# Patient Record
Sex: Female | Born: 1945 | Race: Black or African American | Hispanic: No | Marital: Single | Smoking: Former smoker
Health system: Southern US, Community
[De-identification: ages and names within clinical notes are randomized; demographics above are authoritative.]

## PROBLEM LIST (undated history)

## (undated) DIAGNOSIS — H9319 Tinnitus, unspecified ear: Secondary | ICD-10-CM

## (undated) DIAGNOSIS — I319 Disease of pericardium, unspecified: Secondary | ICD-10-CM

## (undated) DIAGNOSIS — D500 Iron deficiency anemia secondary to blood loss (chronic): Secondary | ICD-10-CM

## (undated) DIAGNOSIS — G609 Hereditary and idiopathic neuropathy, unspecified: Secondary | ICD-10-CM

## (undated) DIAGNOSIS — R3915 Urgency of urination: Secondary | ICD-10-CM

## (undated) DIAGNOSIS — R350 Frequency of micturition: Secondary | ICD-10-CM

## (undated) DIAGNOSIS — J189 Pneumonia, unspecified organism: Secondary | ICD-10-CM

## (undated) DIAGNOSIS — Z9981 Dependence on supplemental oxygen: Secondary | ICD-10-CM

## (undated) DIAGNOSIS — J449 Chronic obstructive pulmonary disease, unspecified: Secondary | ICD-10-CM

## (undated) DIAGNOSIS — F329 Major depressive disorder, single episode, unspecified: Secondary | ICD-10-CM

## (undated) DIAGNOSIS — I10 Essential (primary) hypertension: Secondary | ICD-10-CM

## (undated) DIAGNOSIS — F3289 Other specified depressive episodes: Secondary | ICD-10-CM

## (undated) DIAGNOSIS — E785 Hyperlipidemia, unspecified: Secondary | ICD-10-CM

## (undated) DIAGNOSIS — I313 Pericardial effusion (noninflammatory): Secondary | ICD-10-CM

## (undated) DIAGNOSIS — Z91199 Patient's noncompliance with other medical treatment and regimen due to unspecified reason: Secondary | ICD-10-CM

## (undated) DIAGNOSIS — F411 Generalized anxiety disorder: Secondary | ICD-10-CM

## (undated) DIAGNOSIS — F410 Panic disorder [episodic paroxysmal anxiety] without agoraphobia: Secondary | ICD-10-CM

## (undated) DIAGNOSIS — E669 Obesity, unspecified: Secondary | ICD-10-CM

## (undated) DIAGNOSIS — J9620 Acute and chronic respiratory failure, unspecified whether with hypoxia or hypercapnia: Secondary | ICD-10-CM

## (undated) DIAGNOSIS — Z9119 Patient's noncompliance with other medical treatment and regimen: Secondary | ICD-10-CM

## (undated) DIAGNOSIS — J4489 Other specified chronic obstructive pulmonary disease: Secondary | ICD-10-CM

## (undated) DIAGNOSIS — K625 Hemorrhage of anus and rectum: Secondary | ICD-10-CM

## (undated) HISTORY — DX: Other specified chronic obstructive pulmonary disease: J44.89

## (undated) HISTORY — DX: Acute and chronic respiratory failure, unspecified whether with hypoxia or hypercapnia: J96.20

## (undated) HISTORY — DX: Obesity, unspecified: E66.9

## (undated) HISTORY — DX: Urgency of urination: R39.15

## (undated) HISTORY — DX: Hemorrhage of anus and rectum: K62.5

## (undated) HISTORY — DX: Panic disorder (episodic paroxysmal anxiety): F41.0

## (undated) HISTORY — DX: Chronic obstructive pulmonary disease, unspecified: J44.9

## (undated) HISTORY — DX: Frequency of micturition: R35.0

## (undated) HISTORY — DX: Hyperlipidemia, unspecified: E78.5

## (undated) HISTORY — PX: BREAST BIOPSY: SHX20

## (undated) HISTORY — DX: Disease of pericardium, unspecified: I31.9

## (undated) HISTORY — DX: Other specified depressive episodes: F32.89

## (undated) HISTORY — DX: Pericardial effusion (noninflammatory): I31.3

## (undated) HISTORY — DX: Essential (primary) hypertension: I10

## (undated) HISTORY — DX: Patient's noncompliance with other medical treatment and regimen due to unspecified reason: Z91.199

## (undated) HISTORY — DX: Patient's noncompliance with other medical treatment and regimen: Z91.19

## (undated) HISTORY — PX: TOTAL ABDOMINAL HYSTERECTOMY: SHX209

## (undated) HISTORY — DX: Major depressive disorder, single episode, unspecified: F32.9

## (undated) HISTORY — DX: Hereditary and idiopathic neuropathy, unspecified: G60.9

## (undated) HISTORY — DX: Generalized anxiety disorder: F41.1

## (undated) HISTORY — DX: Tinnitus, unspecified ear: H93.19

## (undated) HISTORY — DX: Pneumonia, unspecified organism: J18.9

---

## 2010-04-14 ENCOUNTER — Inpatient Hospital Stay (HOSPITAL_COMMUNITY)
Admission: EM | Admit: 2010-04-14 | Discharge: 2010-04-16 | Payer: Self-pay | Source: Home / Self Care | Admitting: Emergency Medicine

## 2010-07-01 ENCOUNTER — Inpatient Hospital Stay (HOSPITAL_COMMUNITY)
Admission: EM | Admit: 2010-07-01 | Discharge: 2010-07-06 | Payer: Self-pay | Source: Home / Self Care | Attending: Internal Medicine | Admitting: Internal Medicine

## 2010-07-02 DIAGNOSIS — J449 Chronic obstructive pulmonary disease, unspecified: Secondary | ICD-10-CM

## 2010-07-02 LAB — CBC
HCT: 39.3 % (ref 36.0–46.0)
Hemoglobin: 12 g/dL (ref 12.0–15.0)
MCH: 28.1 pg (ref 26.0–34.0)
MCHC: 30.5 g/dL (ref 30.0–36.0)
MCV: 92 fL (ref 78.0–100.0)
Platelets: 276 10*3/uL (ref 150–400)
RBC: 4.27 MIL/uL (ref 3.87–5.11)
RDW: 15.8 % — ABNORMAL HIGH (ref 11.5–15.5)
WBC: 8.2 10*3/uL (ref 4.0–10.5)

## 2010-07-02 LAB — POCT I-STAT 3, ART BLOOD GAS (G3+)
Acid-Base Excess: 9 mmol/L — ABNORMAL HIGH (ref 0.0–2.0)
Bicarbonate: 39 mEq/L — ABNORMAL HIGH (ref 20.0–24.0)
O2 Saturation: 91 %
Patient temperature: 98.6
TCO2: 42 mmol/L (ref 0–100)
pCO2 arterial: 85.7 mmHg (ref 35.0–45.0)
pH, Arterial: 7.266 — ABNORMAL LOW (ref 7.350–7.400)
pO2, Arterial: 73 mmHg — ABNORMAL LOW (ref 80.0–100.0)

## 2010-07-02 LAB — DIFFERENTIAL
Basophils Absolute: 0 10*3/uL (ref 0.0–0.1)
Basophils Relative: 0 % (ref 0–1)
Eosinophils Absolute: 0.5 10*3/uL (ref 0.0–0.7)
Eosinophils Relative: 6 % — ABNORMAL HIGH (ref 0–5)
Lymphocytes Relative: 31 % (ref 12–46)
Lymphs Abs: 2.6 10*3/uL (ref 0.7–4.0)
Monocytes Absolute: 0.6 10*3/uL (ref 0.1–1.0)
Monocytes Relative: 7 % (ref 3–12)
Neutro Abs: 4.5 10*3/uL (ref 1.7–7.7)
Neutrophils Relative %: 56 % (ref 43–77)

## 2010-07-02 LAB — BASIC METABOLIC PANEL
BUN: 15 mg/dL (ref 6–23)
CO2: 36 mEq/L — ABNORMAL HIGH (ref 19–32)
Calcium: 9 mg/dL (ref 8.4–10.5)
Chloride: 94 mEq/L — ABNORMAL LOW (ref 96–112)
Creatinine, Ser: 0.98 mg/dL (ref 0.4–1.2)
GFR calc Af Amer: >60 mL/min (ref >60)
GFR calc non Af Amer: 57 mL/min — ABNORMAL LOW (ref >60)
Glucose, Bld: 147 mg/dL — ABNORMAL HIGH (ref 70–99)
Potassium: 3.6 mEq/L (ref 3.5–5.1)
Sodium: 139 mEq/L (ref 135–145)

## 2010-07-02 LAB — POCT CARDIAC MARKERS
CKMB, poc: 5.5 ng/mL (ref 1.0–8.0)
Myoglobin, poc: 207 ng/mL (ref 12–200)
Troponin i, poc: <0.05 ng/mL (ref 0.00–0.09)

## 2010-07-02 LAB — CK TOTAL AND CKMB (NOT AT ARMC)
CK, MB: 5.2 ng/mL — ABNORMAL HIGH (ref 0.3–4.0)
Relative Index: 3.5 — ABNORMAL HIGH (ref 0.0–2.5)
Total CK: 150 U/L (ref 7–177)

## 2010-07-02 LAB — TROPONIN I: Troponin I: 0.02 ng/mL (ref 0.00–0.06)

## 2010-07-02 LAB — BRAIN NATRIURETIC PEPTIDE: Pro B Natriuretic peptide (BNP): <30 pg/mL (ref 0.0–100.0)

## 2010-07-02 LAB — GLUCOSE, CAPILLARY: Glucose-Capillary: 162 mg/dL — ABNORMAL HIGH (ref 70–99)

## 2010-07-03 DIAGNOSIS — R0902 Hypoxemia: Secondary | ICD-10-CM

## 2010-07-03 DIAGNOSIS — J441 Chronic obstructive pulmonary disease with (acute) exacerbation: Secondary | ICD-10-CM

## 2010-07-04 DIAGNOSIS — J9620 Acute and chronic respiratory failure, unspecified whether with hypoxia or hypercapnia: Secondary | ICD-10-CM

## 2010-07-04 DIAGNOSIS — I313 Pericardial effusion (noninflammatory): Secondary | ICD-10-CM | POA: Insufficient documentation

## 2010-07-04 LAB — BLOOD GAS, ARTERIAL
Acid-Base Excess: 0.9 mmol/L (ref 0.0–2.0)
Bicarbonate: 24.9 mEq/L — ABNORMAL HIGH (ref 20.0–24.0)
Drawn by: 33234
O2 Content: 2 L/min
O2 Saturation: 96.6 %
Patient temperature: 98.6
TCO2: 26.1 mmol/L (ref 0–100)
pCO2 arterial: 39.4 mmHg (ref 35.0–45.0)
pH, Arterial: 7.418 — ABNORMAL HIGH (ref 7.350–7.400)
pO2, Arterial: 86.7 mmHg (ref 80.0–100.0)

## 2010-07-04 LAB — POCT I-STAT 3, ART BLOOD GAS (G3+)
Acid-Base Excess: 9 mmol/L — ABNORMAL HIGH (ref 0.0–2.0)
Bicarbonate: 37.9 mEq/L — ABNORMAL HIGH (ref 20.0–24.0)
O2 Saturation: 81 %
TCO2: 40 mmol/L (ref 0–100)
pCO2 arterial: 67.5 mmHg (ref 35.0–45.0)
pH, Arterial: 7.357 (ref 7.350–7.400)
pO2, Arterial: 49 mmHg — ABNORMAL LOW (ref 80.0–100.0)

## 2010-07-04 LAB — BASIC METABOLIC PANEL
BUN: 31 mg/dL — ABNORMAL HIGH (ref 6–23)
CO2: 36 mEq/L — ABNORMAL HIGH (ref 19–32)
Calcium: 9.5 mg/dL (ref 8.4–10.5)
Chloride: 97 mEq/L (ref 96–112)
Creatinine, Ser: 1.08 mg/dL (ref 0.4–1.2)
GFR calc Af Amer: >60 mL/min (ref >60)
GFR calc non Af Amer: 51 mL/min — ABNORMAL LOW (ref >60)
Glucose, Bld: 151 mg/dL — ABNORMAL HIGH (ref 70–99)
Potassium: 4.3 mEq/L (ref 3.5–5.1)
Sodium: 140 mEq/L (ref 135–145)

## 2010-07-04 LAB — CBC
HCT: 38.9 % (ref 36.0–46.0)
Hemoglobin: 11.7 g/dL — ABNORMAL LOW (ref 12.0–15.0)
MCH: 27.6 pg (ref 26.0–34.0)
MCHC: 30.1 g/dL (ref 30.0–36.0)
MCV: 91.7 fL (ref 78.0–100.0)
Platelets: 298 10*3/uL (ref 150–400)
RBC: 4.24 MIL/uL (ref 3.87–5.11)
RDW: 15.8 % — ABNORMAL HIGH (ref 11.5–15.5)
WBC: 11.9 10*3/uL — ABNORMAL HIGH (ref 4.0–10.5)

## 2010-07-04 LAB — GLUCOSE, CAPILLARY
Glucose-Capillary: 183 mg/dL — ABNORMAL HIGH (ref 70–99)
Glucose-Capillary: 151 mg/dL — ABNORMAL HIGH (ref 70–99)
Glucose-Capillary: 156 mg/dL — ABNORMAL HIGH (ref 70–99)
Glucose-Capillary: 143 mg/dL — ABNORMAL HIGH (ref 70–99)
Glucose-Capillary: 208 mg/dL — ABNORMAL HIGH (ref 70–99)
Glucose-Capillary: 167 mg/dL — ABNORMAL HIGH (ref 70–99)
Glucose-Capillary: 256 mg/dL — ABNORMAL HIGH (ref 70–99)
Glucose-Capillary: 156 mg/dL — ABNORMAL HIGH (ref 70–99)
Glucose-Capillary: 318 mg/dL — ABNORMAL HIGH (ref 70–99)

## 2010-07-04 LAB — BRAIN NATRIURETIC PEPTIDE: Pro B Natriuretic peptide (BNP): 65 pg/mL (ref 0.0–100.0)

## 2010-07-04 LAB — HEMOGLOBIN A1C
Hgb A1c MFr Bld: 6.5 % — ABNORMAL HIGH (ref <5.7)
Mean Plasma Glucose: 140 mg/dL — ABNORMAL HIGH (ref <117)

## 2010-07-04 LAB — MRSA PCR SCREENING: MRSA by PCR: NEGATIVE

## 2010-07-05 NOTE — Progress Notes (Signed)
NAME:  GRACELAND, WACHTER NO.:  [id number]  MEDICAL RECORD NO.:  [id number]          PATIENT TYPE:  INP  LOCATION:  2607                         FACILITY:  [facility]  PHYSICIAN:  Jeoffrey Massed, MD    DATE OF BIRTH:  1945-12-07                                PROGRESS NOTE   PRIMARY CARE PRACTITIONER: Does not have one as she just recently moved to [place] from [place].  CURRENT MEDICAL ISSUES: 1. Acute on chronic respiratory failure. 2. Acute exacerbation of chronic obstructive pulmonary disease. 3. Pneumonia. 4. Anxiety disorder. 5. Hypertension. 6. History of diabetes on metformin, currently on hold.  CONSULTANTS ON THE CASE: 1. Kalman Shan, MD from [facility].  PHYSICAL EXAMINATION: VITAL SIGNS:  Temperature 97.7, heart rate of 81, blood pressure of 155/94, pulse ox of 92% on 4 L. GENERAL:  An elderly black female in no acute distress.  She is easily able to talk in full sentences and is not using any accessory muscles of respiration. HEENT:  Atraumatic, normocephalic.  Pupils are equally reactive to light and accommodation. NECK:  Supple. CHEST:  Bilaterally clear to auscultation, however, there is decreased air entry at both bilateral bases. CARDIOVASCULAR:  Heart sounds are regular.  No murmurs heard. ABDOMEN:  Soft, nontender, nondistended.  However, the patient is obese. EXTREMITIES:  There is no peripheral edema or cyanosis. NEUROLOGIC:  The patient is alert, oriented x3 and there are no focal neurological deficits.  PERTINENT RADIOLOGICAL STUDIES: 1. X-ray of the chest done on July 03, 2010, showed cardiomegaly     and bibasilar atelectasis. 2. CT angiogram of the chest done on July 04, 2010 shows no     pulmonary embolism.  LABORATORY DATA: 1. HbA1c is 6.5. 2. ABG done today shows a pH of 7.362, pCO2 of 68.5, pO2 of 60.3. 3. BNP done on the July 03, 2010 is 53.  BRIEF HOSPITAL COURSE: 1. Acute on  chronic respiratory failure, this is both hypoxic and     hypercarbic and is probably secondary to an acute exacerbation of     COPD.  This patient does have pretty severe COPD and she is on home     O2 at all times and can only walk approximately 20 feet before she     has to stop and catch the breath.  In any event, she was admitted     to the hospital on the 16th of January 2012 with shortness of     breath and because of her history and a clinical situation it was     felt that this the patient needed to be observed in an ICU area and     hence admitted to the hospitalist service with [facility] and consulting.     Throughout her ICU stay, she has intermittently required BiPAP.     Initially she was desaturating even with minimal activity and was     getting short of breath with minimal activity.  Her medications     were adjusted.  Aggressive pulmonary toileting was done and she is     now very close to her usual baseline.  The CT  angiogram was also     done to make sure a small pulmonary embolus was not responsible for     the current situation.  It is felt that since the patient is almost     at her clinical baseline that she will not benefit further from     further care here in the ICU area and is considered stable at this     point to move to a regular medical floor.  She will continue to be     tapered off steroids.  Her antibiotics will also need to be     continued for another 4 days.  This patient does not have a primary     physician in this area as she just recently moved here and probably     will need one arranged by the case management worker prior to her     discharge. 2. Diabetes.  As an outpatient, the patient is maintained on     metformin; however, this was held when she was admitted.  She did     undergo CT angiogram on the 18th and the metformin continues to be     on hold.  She does have some amount of elevated CBGs  because of     the fact that she is on steroids and  currently is being maintained     on Lantus and sliding scale insulin.  She will need to be resumed     back on to her metformin prior to her being discharged. 3. Hypertension.  This is being well controlled.  She is to continue     her usual medications of diltiazem, losartan, amlodipine. 4. Pneumonia.  This was seen on the x-ray given history of severe COPD     and clinical situation, she has been placed on Avelox and this will     need to be continued for another 4 more days as ordered. 5. Anxiety.  The patient was noted to having anxiety during her stay     here in the hospital particularly when she is somewhat short of     breath and had good response with Xanax. 6. Code status.  The patient wishes to remain a full code.  DISPOSITION: The patient currently is deemed stable to be transferred to a regular medical floor.  It is thought that she will require 1 or 2 more days of further hospitalization prior to her being discharged.  Please note that this patient will require a primary care practitioner to be arranged prior to her discharge.  Total time spent, 45 minutes.     Jeoffrey Massed, MD     SG/MEDQ  D:  07/05/2010  T:  07/05/2010  Job:  [id number]  cc:   Kalman Shan, MD [address] [place] [address]  Electronically Signed by Jeoffrey Massed  on 07/05/2010 04:43:42 PM

## 2010-07-09 LAB — BLOOD GAS, ARTERIAL
Bicarbonate: 37.5 mEq/L — ABNORMAL HIGH (ref 20.0–24.0)
Drawn by: 275531
O2 Content: 2 L/min
O2 Saturation: 89.3 %
Patient temperature: 98.6
pCO2 arterial: 64.3 mmHg (ref 35.0–45.0)
pH, Arterial: 7.384 (ref 7.350–7.400)
pO2, Arterial: 60.3 mmHg — ABNORMAL LOW (ref 80.0–100.0)
pO2, Arterial: 74.5 mmHg — ABNORMAL LOW (ref 80.0–100.0)

## 2010-07-09 LAB — GLUCOSE, CAPILLARY
Glucose-Capillary: 236 mg/dL — ABNORMAL HIGH (ref 70–99)
Glucose-Capillary: 142 mg/dL — ABNORMAL HIGH (ref 70–99)
Glucose-Capillary: 202 mg/dL — ABNORMAL HIGH (ref 70–99)

## 2010-07-09 LAB — BASIC METABOLIC PANEL
BUN: 38 mg/dL — ABNORMAL HIGH (ref 6–23)
CO2: 35 mEq/L — ABNORMAL HIGH (ref 19–32)
Chloride: 99 mEq/L (ref 96–112)
Creatinine, Ser: 1.13 mg/dL (ref 0.4–1.2)
GFR calc Af Amer: 59 mL/min — ABNORMAL LOW (ref >60)

## 2010-07-09 LAB — DIFFERENTIAL
Lymphocytes Relative: 5 % — ABNORMAL LOW (ref 12–46)
Lymphs Abs: 0.6 10*3/uL — ABNORMAL LOW (ref 0.7–4.0)
Monocytes Relative: 3 % (ref 3–12)
Neutro Abs: 10.9 10*3/uL — ABNORMAL HIGH (ref 1.7–7.7)
Neutrophils Relative %: 92 % — ABNORMAL HIGH (ref 43–77)

## 2010-07-09 LAB — CBC
HCT: 38.8 % (ref 36.0–46.0)
Hemoglobin: 11.6 g/dL — ABNORMAL LOW (ref 12.0–15.0)
MCH: 27.4 pg (ref 26.0–34.0)
MCV: 91.7 fL (ref 78.0–100.0)
RBC: 4.23 MIL/uL (ref 3.87–5.11)

## 2010-07-09 LAB — MRSA PCR SCREENING: MRSA by PCR: NEGATIVE

## 2010-07-09 NOTE — Discharge Summary (Addendum)
NAME:  [name], [name] NO.:  [id number]  MEDICAL RECORD NO.:  [id number]          PATIENT TYPE:  INP  LOCATION:  5527                         FACILITY:  [facility]  PHYSICIAN:  Lonia Blood, M.D.       DATE OF BIRTH:  1945/06/21  DATE OF ADMISSION:  07/02/2010 DATE OF DISCHARGE:  07/06/2010                              DISCHARGE SUMMARY   PRIMARY CARE PHYSICIAN:  This patient has been referred as a new patient, [facility], Dr. Renato Gails on June 29, 2010, at 12:30 p.m.  DISCHARGE DIAGNOSES: 1. Chronic obstructive pulmonary disease exacerbation. 2. Acute on chronic respiratory failure. 3. Diabetes mellitus type 2. 4. Hypertension. 5. Status post hysterectomy. 6. Probable community-acquired pneumonia based on infiltrate on the     chest x-ray, but without any fever or sputum to confirm. 7. Moderate obesity.  DISCHARGE MEDICATIONS: 1. Doxycycline 100 mg by mouth twice a day for 5 days. 2. Prednisone taper 40 mg for 2 days, 20 mg for 2 days, then 10 mg for     2 days, then stop. 3. Advair Diskus 100/50 one puff inhaled daily. 4. Albuterol inhaler 2 puffs daily as needed for wheezing. 5. Aspirin 81 mg daily. 6. Combivent 2 puffs inhale four times a day as needed. 7. Cozaar 100 mg daily. 8. Diltiazem 180 mg daily. 9. Fish oil 1 capsule daily. 10.Metformin 500 mg twice a day. 11.Hydrochlorothiazide 25 mg daily. 12.Multivitamin tablet daily. 13.Spiriva 1 capsule inhaled twice a day.  CONDITION ON DISCHARGE:  [name] was discharged in fair condition. She will continue home health nurse, respiratory therapist, and oxygen. She will follow up with Dr. Marchelle Gearing from [facility] on July 26, 2010, at 10:45 a.m.  She will follow up with her new primary care physician, Dr. Bufford Spikes, on December 15, 2010.  The patient will be also be provided with help with transportation home.  For complete list of procedures and hospital course in great  detail, refer to the progress note dictated yesterday by Dr. Jerral Ralph.  Briefly, and to summarize, [name] is a 65 year old woman from [place] moved to [place].  In one week after her move, she was without her inhalers and medications, developed acute exacerbation for COPD.  Cause of this is postulated to have been infectious as there was a possible infiltrate on chest x-ray.  Upon presentation to the emergency room, she had acute on chronic respiratory failure with initial arterial blood gas showing a pH of 7.35, pCO2 of 64, pO2 of 59. She was placed on BiPAP and admitted to the intensive care unit.  In intensive care unit, she remained stable without need for intubation mechanical ventilation.  She was seen on a daily basis by the consulting pulmonologist, Dr. Marchelle Gearing.  Her discharge ABG shows a pH of 7.38, pCO2 of 64, pO2 of 74, and bicarbonate 37 suggesting a compensated chronic respiratory failure.  She seems to be tolerating her situation fairly well and she is stable enough today July 06, 2010, to be discharged home with prednisone taper resumption of her chronic inhalers, antibiotics, and home health agency help.  She in the future may  benefit from assessment for wheelchair necessity if her respiratory status does not improve.  Obviously, Dr. Marchelle Gearing is going to refer the patient to pulmonary rehabilitation.  In regards to the patient's diabetes, this has been deemed to be type 2 diabetes in fairly decent control with a hemoglobin A1c measured 6.5.  The patient will resume her metformin and diet at the time of her discharge.     Lonia Blood, M.D.     SL/MEDQ  D:  07/06/2010  T:  07/06/2010  Job:  [id number]  cc:   Kalman Shan, MD Bufford Spikes, DO  Electronically Signed by Lonia Blood M.D. on 07/09/2010 06:29:30 PM

## 2010-07-26 ENCOUNTER — Inpatient Hospital Stay: Payer: Self-pay | Admitting: Internal Medicine

## 2010-07-27 ENCOUNTER — Telehealth: Payer: Self-pay | Admitting: Internal Medicine

## 2010-08-02 NOTE — Progress Notes (Addendum)
Summary: nos appt  Phone Note Call from Patient   Caller: [email or web address]  Call For: Catherine Cubero Summary of Call: LMTCB x2 to rsc nos from 2/9. Initial call taken by: Darletta Moll,  July 27, 2010 2:41 PM

## 2010-08-29 LAB — POCT I-STAT 3, ART BLOOD GAS (G3+)
Acid-Base Excess: 8 mmol/L — ABNORMAL HIGH (ref 0.0–2.0)
Bicarbonate: 35.3 mEq/L — ABNORMAL HIGH (ref 20.0–24.0)
pCO2 arterial: 64 mmHg (ref 35.0–45.0)
pH, Arterial: 7.35 (ref 7.350–7.400)
pO2, Arterial: 59 mmHg — ABNORMAL LOW (ref 80.0–100.0)

## 2010-08-29 LAB — GLUCOSE, CAPILLARY
Glucose-Capillary: 142 mg/dL — ABNORMAL HIGH (ref 70–99)
Glucose-Capillary: 150 mg/dL — ABNORMAL HIGH (ref 70–99)
Glucose-Capillary: 155 mg/dL — ABNORMAL HIGH (ref 70–99)
Glucose-Capillary: 169 mg/dL — ABNORMAL HIGH (ref 70–99)

## 2010-08-29 LAB — CULTURE, BLOOD (ROUTINE X 2): Culture: NO GROWTH

## 2010-08-29 LAB — CBC
HCT: 36.6 % (ref 36.0–46.0)
HCT: 40.7 % (ref 36.0–46.0)
Hemoglobin: 11.6 g/dL — ABNORMAL LOW (ref 12.0–15.0)
MCV: 92.7 fL (ref 78.0–100.0)
Platelets: 306 10*3/uL (ref 150–400)
RBC: 4.06 MIL/uL (ref 3.87–5.11)
RBC: 4.39 MIL/uL (ref 3.87–5.11)
WBC: 8.1 10*3/uL (ref 4.0–10.5)

## 2010-08-29 LAB — DIFFERENTIAL
Basophils Absolute: 0 10*3/uL (ref 0.0–0.1)
Basophils Relative: 0 % (ref 0–1)
Eosinophils Absolute: 0.5 10*3/uL (ref 0.0–0.7)
Monocytes Absolute: 0.6 10*3/uL (ref 0.1–1.0)
Neutro Abs: 4.2 10*3/uL (ref 1.7–7.7)

## 2010-08-29 LAB — COMPREHENSIVE METABOLIC PANEL
ALT: 19 U/L (ref 0–35)
Albumin: 4 g/dL (ref 3.5–5.2)
Alkaline Phosphatase: 77 U/L (ref 39–117)
BUN: 15 mg/dL (ref 6–23)
Chloride: 99 mEq/L (ref 96–112)
Glucose, Bld: 77 mg/dL (ref 70–99)
Potassium: 4.1 mEq/L (ref 3.5–5.1)
Sodium: 142 mEq/L (ref 135–145)
Total Bilirubin: 0.5 mg/dL (ref 0.3–1.2)

## 2010-08-29 LAB — CARDIAC PANEL(CRET KIN+CKTOT+MB+TROPI)
CK, MB: 7.2 ng/mL (ref 0.3–4.0)
Relative Index: 3.2 — ABNORMAL HIGH (ref 0.0–2.5)
Total CK: 217 U/L — ABNORMAL HIGH (ref 7–177)

## 2010-08-29 LAB — BASIC METABOLIC PANEL
CO2: 32 mEq/L (ref 19–32)
Calcium: 9.1 mg/dL (ref 8.4–10.5)
GFR calc Af Amer: 53 mL/min — ABNORMAL LOW (ref >60)
GFR calc non Af Amer: 44 mL/min — ABNORMAL LOW (ref >60)
Potassium: 4.1 mEq/L (ref 3.5–5.1)
Sodium: 140 mEq/L (ref 135–145)

## 2010-09-13 ENCOUNTER — Emergency Department (HOSPITAL_COMMUNITY): Payer: PRIVATE HEALTH INSURANCE

## 2010-09-13 ENCOUNTER — Inpatient Hospital Stay (HOSPITAL_COMMUNITY)
Admission: EM | Admit: 2010-09-13 | Discharge: 2010-09-17 | DRG: 189 | Disposition: A | Discharge status: Home Health | Payer: PRIVATE HEALTH INSURANCE | Attending: Internal Medicine | Admitting: Internal Medicine

## 2010-09-13 ENCOUNTER — Encounter (HOSPITAL_COMMUNITY): Payer: Self-pay | Admitting: Radiology

## 2010-09-13 DIAGNOSIS — Z7982 Long term (current) use of aspirin: Secondary | ICD-10-CM

## 2010-09-13 DIAGNOSIS — I712 Thoracic aortic aneurysm, without rupture, unspecified: Secondary | ICD-10-CM | POA: Diagnosis present

## 2010-09-13 DIAGNOSIS — I319 Disease of pericardium, unspecified: Secondary | ICD-10-CM | POA: Diagnosis present

## 2010-09-13 DIAGNOSIS — I10 Essential (primary) hypertension: Secondary | ICD-10-CM | POA: Diagnosis present

## 2010-09-13 DIAGNOSIS — E119 Type 2 diabetes mellitus without complications: Secondary | ICD-10-CM | POA: Diagnosis present

## 2010-09-13 DIAGNOSIS — E0590 Thyrotoxicosis, unspecified without thyrotoxic crisis or storm: Secondary | ICD-10-CM | POA: Diagnosis present

## 2010-09-13 DIAGNOSIS — E669 Obesity, unspecified: Secondary | ICD-10-CM | POA: Diagnosis present

## 2010-09-13 DIAGNOSIS — Z880 Allergy status to penicillin: Secondary | ICD-10-CM

## 2010-09-13 DIAGNOSIS — Z9981 Dependence on supplemental oxygen: Secondary | ICD-10-CM

## 2010-09-13 DIAGNOSIS — M7989 Other specified soft tissue disorders: Secondary | ICD-10-CM

## 2010-09-13 DIAGNOSIS — Z66 Do not resuscitate: Secondary | ICD-10-CM | POA: Diagnosis present

## 2010-09-13 DIAGNOSIS — J9620 Acute and chronic respiratory failure, unspecified whether with hypoxia or hypercapnia: Principal | ICD-10-CM | POA: Diagnosis present

## 2010-09-13 DIAGNOSIS — G4733 Obstructive sleep apnea (adult) (pediatric): Secondary | ICD-10-CM | POA: Diagnosis present

## 2010-09-13 DIAGNOSIS — J441 Chronic obstructive pulmonary disease with (acute) exacerbation: Secondary | ICD-10-CM | POA: Diagnosis present

## 2010-09-13 DIAGNOSIS — F411 Generalized anxiety disorder: Secondary | ICD-10-CM | POA: Diagnosis present

## 2010-09-13 HISTORY — DX: Essential (primary) hypertension: I10

## 2010-09-13 HISTORY — DX: Chronic obstructive pulmonary disease, unspecified: J44.9

## 2010-09-13 LAB — DIFFERENTIAL
Basophils Absolute: 0 10*3/uL (ref 0.0–0.1)
Basophils Relative: 0 % (ref 0–1)
Eosinophils Absolute: 0.1 10*3/uL (ref 0.0–0.7)
Eosinophils Relative: 1 % (ref 0–5)
Lymphs Abs: 1.2 10*3/uL (ref 0.7–4.0)

## 2010-09-13 LAB — POCT I-STAT, CHEM 8
Calcium, Ion: 1.15 mmol/L (ref 1.12–1.32)
Glucose, Bld: 150 mg/dL — ABNORMAL HIGH (ref 70–99)
HCT: 38 % (ref 36.0–46.0)
Hemoglobin: 12.9 g/dL (ref 12.0–15.0)
TCO2: 37 mmol/L (ref 0–100)

## 2010-09-13 LAB — CBC
MCV: 90.1 fL (ref 78.0–100.0)
Platelets: 291 10*3/uL (ref 150–400)
RDW: 16.3 % — ABNORMAL HIGH (ref 11.5–15.5)
WBC: 10.7 10*3/uL — ABNORMAL HIGH (ref 4.0–10.5)

## 2010-09-13 LAB — CK TOTAL AND CKMB (NOT AT ARMC): CK, MB: 7.7 ng/mL (ref 0.3–4.0)

## 2010-09-13 MED ORDER — IOHEXOL 300 MG/ML  SOLN
100.0000 mL | Freq: Once | INTRAMUSCULAR | Status: AC | PRN
  Administered 2010-09-13: 100 mL via INTRAVENOUS

## 2010-09-14 ENCOUNTER — Inpatient Hospital Stay (HOSPITAL_COMMUNITY): Payer: PRIVATE HEALTH INSURANCE

## 2010-09-14 LAB — GLUCOSE, CAPILLARY
Glucose-Capillary: 237 mg/dL — ABNORMAL HIGH (ref 70–99)
Glucose-Capillary: 167 mg/dL — ABNORMAL HIGH (ref 70–99)

## 2010-09-14 LAB — CARDIAC PANEL(CRET KIN+CKTOT+MB+TROPI)
Relative Index: 3.2 — ABNORMAL HIGH (ref 0.0–2.5)
Total CK: 302 U/L — ABNORMAL HIGH (ref 7–177)
Total CK: 338 U/L — ABNORMAL HIGH (ref 7–177)
Troponin I: 0.05 ng/mL (ref 0.00–0.06)

## 2010-09-14 LAB — TSH: TSH: 0.347 u[IU]/mL — ABNORMAL LOW (ref 0.350–4.500)

## 2010-09-14 LAB — CBC
HCT: 36.6 % (ref 36.0–46.0)
MCHC: 31.1 g/dL (ref 30.0–36.0)
Platelets: 286 10*3/uL (ref 150–400)
RDW: 16.4 % — ABNORMAL HIGH (ref 11.5–15.5)

## 2010-09-14 LAB — BASIC METABOLIC PANEL
CO2: 36 mEq/L — ABNORMAL HIGH (ref 19–32)
Calcium: 9.5 mg/dL (ref 8.4–10.5)
Glucose, Bld: 115 mg/dL — ABNORMAL HIGH (ref 70–99)
Potassium: 4.5 mEq/L (ref 3.5–5.1)
Sodium: 141 mEq/L (ref 135–145)

## 2010-09-14 NOTE — H&P (Signed)
NAME:  [name], WESSELLS NO.:  [id number]  MEDICAL RECORD NO.:  [id number]           PATIENT TYPE:  E  LOCATION:  [facility]                         FACILITY:  [facility]  PHYSICIAN:  Zannie Cove, MD     DATE OF BIRTH:  1945/12/25  DATE OF ADMISSION:  09/13/2010 DATE OF DISCHARGE:                             HISTORY & PHYSICAL   PRIMARY CARE PHYSICIAN:  Bufford Spikes, DO  CHIEF COMPLAINT:  Shortness of breath.  HISTORY OF PRESENT ILLNESS:  [name] is a 65 year old female with COPD who was admitted with COPD exacerbation and discharged in January presents with increasing dyspnea on exertion for the last 2-3 days.  The patient reportedly says that on a normal good day she is able to walk 8- 10 feet before getting short of breath but she has been getting more and more dyspneic even with minimal exertion for the last 2 days and today she was increasingly short of breath even at rest.  She denies any wheezing but reports increasing dry cough, unable to cough anything up. Denies any fevers or chills, said that she tried several of her inhalers at home without much benefit and decided to come to the ER.  Over here, she had a CT angiogram which ruled out PE, showed a small pericardial effusion and she was treated with IV steroids and nebulizers and reports significant improvement with the same.  The patient did follow up with her primary physician, Dr. Bufford Spikes, however, somehow did not follow-up with Dr. Marchelle Gearing at [facility] at her followup appointment.  PAST MEDICAL HISTORY:  Significant for: 1. COPD recent exacerbation in January on 1.5 L home O2. 2. Type 2 diabetes. 3. Hypertension. 4. History of community-acquired pneumonia. 5. Obesity. 6. History of hysterectomy.  MEDICATIONS: 1. Aspirin 81 mg. 2. Albuterol inhaler p.r.n. 3. Combivent p.r.n. 4. Advair which she was using but stopped due to causing a nosebleed. 5. Diltiazem CD 180 mg daily. 6.  Cozaar 100 mg daily. 7. Metformin 500 mg b.i.d. 8. Hydrochlorothiazide 25 mg daily. 9. Multivitamin 1 tablet daily. 10.Spiriva 1 capsule b.i.d.  ALLERGIES:  PENICILLIN.  SOCIAL HISTORY:  Lives alone at home, is independent in ADLs.  SOCIAL HISTORY:  Former smoker, smoked one pack per day for 30 years, sometimes more, quit about 5 years ago.  Alcohol drinks 1-2 hard drinks per day.  No history of illicit drug use.  FAMILY HISTORY:  Significant for diabetes.  REVIEW OF SYSTEMS:  Twelve-systems reviewed negative except per HPI.  PHYSICAL EXAMINATION:  VITAL SIGNS:  Temperature is 98.4, pulse is 110, blood pressure is 140/80, respirations 20-24, satting 99% on 2 liters. GENERAL:  She is an obese African American female in no acute distress at this time not using accessory muscles and unable to speak in full sentences. HEENT:  Pupils round and reactive to light.  Extraocular movements intact. NECK:  No JVD or lymphadenopathy. CARDIOVASCULAR SYSTEM:  S1, S2 regular rate and rhythm. LUNGS:  Decreased air movement in all zones. ABDOMEN:  Obese, soft, nontender.  Positive bowel sounds.  No organomegaly. EXTREMITIES:  1+ edema both lower extremities.  LABORATORY DATA:  Review  of laboratory data shows white count of 10.7, hemoglobin 12.9, platelets 291.  Chemistries sodium 140, potassium 3.7, chloride 90, bicarb 37, BUN 21, creatinine 1.4, glucose 150, BNP 44.  Chest x-ray no acute cardiopulmonary disease, stable cardiomegaly.  CT angio of the chest shows a small pericardial effusion, diffuse ectatic aorta maximally 2.9 cm distal aortic arch.  EKG also shows sinus tachycardia.  No acute ST-T wave changes.  ASSESSMENT/PLAN:  Ms. [name] is a 65 year old female with: 1. Acute exacerbation of chronic obstructive pulmonary disease.. 2. Small pericardial effusion. 3. Type 2 diabetes. 4. Obesity. 5. Suspected sleep apnea. 6. Anxiety disorder. 7. Thoracic aortic  aneurysm.  PLAN: 1. We will admit her to telemetry today, start her on IV Solu-Medrol     80 mg q. 6 and Xopenex and Atrovent nebs q.6 hours and p.r.n. as     well as supplemental O2. 2. Also start her on IV Avelox and repeat chest x-ray in a.m. 3. We will also get a 2-D echocardiogram to evaluate for effusion and     she has also never had an echo in our system. 4. For suspected sleep apnea, we will need sleep study at discharge. 5. Code status discussed with the patient she would like to be a DNR. 6. Further management as condition evolves.     Zannie Cove, MD     PJ/MEDQ  D:  09/13/2010  T:  09/13/2010  Job:  [id number]  cc:   Bufford Spikes, DO  Electronically Signed by Zannie Cove  on 09/14/2010 03:41:35 PM

## 2010-09-15 ENCOUNTER — Inpatient Hospital Stay (HOSPITAL_COMMUNITY): Payer: PRIVATE HEALTH INSURANCE

## 2010-09-15 DIAGNOSIS — I319 Disease of pericardium, unspecified: Secondary | ICD-10-CM

## 2010-09-15 LAB — GLUCOSE, CAPILLARY
Glucose-Capillary: 168 mg/dL — ABNORMAL HIGH (ref 70–99)
Glucose-Capillary: 120 mg/dL — ABNORMAL HIGH (ref 70–99)

## 2010-09-16 LAB — GLUCOSE, CAPILLARY: Glucose-Capillary: 216 mg/dL — ABNORMAL HIGH (ref 70–99)

## 2010-09-17 LAB — GLUCOSE, CAPILLARY: Glucose-Capillary: 359 mg/dL — ABNORMAL HIGH (ref 70–99)

## 2010-09-24 NOTE — Discharge Summary (Signed)
NAME:  [name], [name]          ACCOUNT NO.:  [id number]  MEDICAL RECORD NO.:  [id number]           PATIENT TYPE:  I  LOCATION:  4711                         FACILITY:  [facility]  PHYSICIAN:  Jeoffrey Massed, MD    DATE OF BIRTH:  September 08, 1945  DATE OF ADMISSION:  09/13/2010 DATE OF DISCHARGE:                        DISCHARGE SUMMARY - REFERRING   PRIMARY CARE PRACTITIONER:  Bufford Spikes, DO  PRIMARY DISCHARGE DIAGNOSIS:  Acute on chronic respiratory failure secondary to acute exacerbation of chronic obstructive pulmonary disease.  SECONDARY DISCHARGE DIAGNOSES: 1. Small pericardial effusion, awaiting 2-D echocardiogram. 2. Type 2 diabetes. 3. Hypertension. 4. Obesity. 5. Likely gold stage IV chronic obstructive pulmonary disease, on home     O2, able to walk not even half a block at baseline. 6. Anxiety. 7. Likely subclinical hyperthyroidism.  DISCHARGE MEDICATIONS: 1. Avelox 400 mg 1 tablet p.o. daily for another 2 more days. 2. Albuterol nebulizer 2.5 mg inhaled every 2 hours p.r.n. 3. Mucinex 600 mg 1 tablet p.o. twice daily. 4. Atrovent 0.5 mg inhaled every 2 hours p.r.n. 5. Protonix 40 mg 1 tablet daily. 6. Prednisone 40 mg 1 tablet p.o. daily from September 18, 2010, to September 20, 2010. 7. Prednisone 30 mg 1 tablet p.o. daily from September 21, 2010, to September 23, 2010. 8. Prednisone 20 mg p.o. daily from September 24, 2010, to September 26, 2010. 9. Prednisone 10 mg 1 tablet p.o. daily from September 27, 2010, to September 29, 2010, and then discontinue. 10.Ambien 5 mg 1 tablet p.o. nightly. 11.Albuterol inhaler 2 puffs inhaled 4 times a day p.r.n. 12.Spiriva 1 capsule inhaled daily. 13.Aspirin 81 mg 1 tablet p.o. daily. 14.Combivent 2 puffs inhaled 4 times a day p.r.n. 15.Cozaar 100 mg 1 tablet p.o. daily. 16.Cardizem CD 180 mg 1 tablet p.o. daily. 17.Fish oil 1 capsule p.o. daily. 18.Glucophage 500 mg 1 tablet p.o. twice daily. 19.Hydrochlorothiazide 25 mg 1 tablet  daily. 20.Multivitamins 1 tablet p.o. daily.  CONSULTATIONS:  None.  BRIEF HISTORY OF PRESENT ILLNESS:  The patient is a very pleasant, but unfortunate 65 year old female with end-stage COPD, on home O2, who has very limited functional status secondary to a COPD and is not even able to walk half a block without having to take rest.  She was admitted on September 13, 2010, with shortness of breath.  She was then admitted to the Hospitalist Service for further evaluation and treatment.  For further details, please see the history and physical that was dictated by Dr. Jomarie Longs on admission.  PERTINENT RADIOLOGICAL DATA:  CT angiogram of the chest showed no evidence of pulmonary embolism.  Small pericardial effusion increasing since prior study.  Cardiomegaly.  Diffuse aortic ectasia, maximally 3.9 cm in the distal aortic arch.  PERTINENT LABORATORY DATA: 1. HbA1c is 6.7. 2. Cardiac enzymes were cycled and a troponin was negative. 3. BNP was 44. 4. TSH was decreased to 0.347, however, free T4 was normal at 1.02.  BRIEF HOSPITAL COURSE: 1. COPD exacerbation:  The patient has end-stage COPD and requires     home O2 at all times and at baseline.  She is not even able to walk     half a block without having to stop.  She was admitted for     shortness of breath and symptoms were suggestive of an acute     exacerbation of COPD causing her acute on chronic respiratory     failure.  She was admitted, given empiric Solu-Medrol, empiric     antibiotics, and scheduled nebulizers.  She has made significant     improvement during hospital course and she claims she is very much     back to her baseline.  At this time, it is felt that she is stable     to be discharged home.  Please note that from her last admission     she was instructed to follow up with Dr. Marchelle Gearing at [facility]     Pulmonary, however, she was lost to followup.  I have given the     patient Dr. Jane Canary number for her to call and  make an     appointment for further optimization of her COPD regimen and     treatment. 2. Small pericardial effusion:  This was seen during her last     admission as well as this admission CT.  A 2-D echocardiogram is     currently pending and if it does not show any significant findings     she will be discharged later today. 3. Diabetes:  She is to resume her usual medications and during this     hospital course she was on Solu-Medrol and as a result her sugars     slightly on the higher side so she was given some Lantus as well as     sliding scale insulin to cover her during hospital course. 4. Hypertension:  This is moderately well controlled.  She is to     resume her usual medications and follow up with primary care     practitioner for further optimization of her regimen.  DISPOSITION:  We are currently awaiting a 2-D echo and if that does not show any significant abnormalities the patient will be discharged home later today.  FOLLOWUP INSTRUCTIONS: 1. The patient is to follow up with primary care practitioner, Dr.     Renato Gails within 1 week upon discharge.  She is to call and make an     appointment. 2. The patient is to follow up with Dr. Marchelle Gearing of [facility]     Pulmonary.  She is to call and make an appointment within 1 week.     His number has been placed on the pink chart for the patient's     convenience.  Total time spent coordinating discharge is 45 minutes.     Jeoffrey Massed, MD     SG/MEDQ  D:  09/17/2010  T:  09/17/2010  Job:  [id number]  cc:   Bufford Spikes, DO Kalman Shan, MD  Electronically Signed by Jeoffrey Massed  on 09/24/2010 08:27:57 PM

## 2010-10-01 ENCOUNTER — Telehealth: Payer: Self-pay | Admitting: Internal Medicine

## 2010-10-01 NOTE — Telephone Encounter (Signed)
Was discharged recently. [facility] notes state he is to fu up withme but they did not give appt. Pls call him and give appt

## 2010-10-10 NOTE — Telephone Encounter (Signed)
[id number] to set appt.Carron Curie, CMA

## 2010-10-16 NOTE — Telephone Encounter (Signed)
Pt scheduled to see MR on 5-14 at 11:15. Carron Curie, CMA

## 2010-10-29 ENCOUNTER — Ambulatory Visit (INDEPENDENT_AMBULATORY_CARE_PROVIDER_SITE_OTHER): Payer: Medicare Other | Admitting: Internal Medicine

## 2010-10-29 ENCOUNTER — Encounter: Payer: Self-pay | Admitting: Internal Medicine

## 2010-10-29 VITALS — BP 146/86 | HR 98 | Temp 98.2°F | Ht 62.5 in | Wt 193.8 lb

## 2010-10-29 DIAGNOSIS — J441 Chronic obstructive pulmonary disease with (acute) exacerbation: Secondary | ICD-10-CM

## 2010-10-29 DIAGNOSIS — I313 Pericardial effusion (noninflammatory): Secondary | ICD-10-CM

## 2010-10-29 DIAGNOSIS — J449 Chronic obstructive pulmonary disease, unspecified: Secondary | ICD-10-CM

## 2010-10-29 DIAGNOSIS — I319 Disease of pericardium, unspecified: Secondary | ICD-10-CM

## 2010-10-29 NOTE — Patient Instructions (Addendum)
Pleaes have full PFT and ABG test After that return to see my nurse Tammy for medicine calendar; you need to bring all your medicines for this visit I am going to set you up for pulmonary rehab ........ Update 11/09/2010: will getr her to see cardiology for pericardial effusion Check alpha 1 at followup

## 2010-10-29 NOTE — Progress Notes (Signed)
  Subjective:    Patient ID: [name], female    DOB: 1945/08/16, 65 y.o.   MRN: [id number]  HPI Hospital fu for likely gold stage 4 copd patient. Has had 3 admisssions since Oct 2011 for AECOPD - Oct 2011, JAn 2012 and March 2012. Latter two admits PE ruled out each time. Altternative etiologies not found except small but increasing pericardial effusion on CT jan and march 2012 but has not seen cards for this yet. Was Rx with bipap. Has isolated social situation. Quit smoking several years ago. Currently at baseline class 3 dyspnea on exertion with o2 on. Meds reviwed: says advair causes epsitaxis. Appears to be currently on spiriva, and atrovent, albuterol neb and mdi and po prednisone. Denies chest pain, cougn, wheeze. Admits to snoring and excess daytime somnolence but not using cpap; no dx of OSA.     Review of Systems  Constitutional: Negative for fever and unexpected weight change.  HENT: Negative for ear pain, nosebleeds, congestion, sore throat, rhinorrhea, sneezing, trouble swallowing, dental problem, postnasal drip and sinus pressure.   Eyes: Negative for redness and itching.  Respiratory: Positive for chest tightness and shortness of breath. Negative for cough and wheezing.   Cardiovascular: Positive for leg swelling. Negative for palpitations.  Gastrointestinal: Negative for nausea and vomiting.  Genitourinary: Negative for dysuria.  Musculoskeletal: Positive for joint swelling.  Skin: Negative for rash.  Neurological: Negative for headaches.  Hematological: Does not bruise/bleed easily.  Psychiatric/Behavioral: Negative for dysphoric mood. The patient is not nervous/anxious.        Objective:   Physical Exam  Vitals reviewed. Constitutional: She is oriented to person, place, and time. She appears well-developed and well-nourished. No distress.       obese  HENT:  Head: Normocephalic and atraumatic.  Right Ear: External ear normal.  Left Ear: External ear normal.    Mouth/Throat: Oropharynx is clear and moist. No oropharyngeal exudate.  Eyes: Conjunctivae and EOM are normal. Pupils are equal, round, and reactive to light. Right eye exhibits no discharge. Left eye exhibits no discharge. No scleral icterus.  Neck: Normal range of motion. Neck supple. No JVD present. No tracheal deviation present. No thyromegaly present.  Cardiovascular: Normal rate, regular rhythm, normal heart sounds and intact distal pulses.  Exam reveals no gallop and no friction rub.   No murmur heard. Pulmonary/Chest: Effort normal. No respiratory distress. She has no wheezes. She has no rales. She exhibits no tenderness.       Prolonged expiration No wheeze No respiratory distress  Abdominal: Soft. Bowel sounds are normal. She exhibits no distension and no mass. There is no tenderness. There is no rebound and no guarding.  Musculoskeletal: Normal range of motion. She exhibits no edema and no tenderness.  Lymphadenopathy:    She has no cervical adenopathy.  Neurological: She is alert and oriented to person, place, and time. She has normal reflexes. No cranial nerve deficit. She exhibits normal muscle tone. Coordination normal.  Skin: Skin is warm and dry. No rash noted. She is not diaphoretic. No erythema. No pallor.  Psychiatric: She has a normal mood and affect. Her behavior is normal. Judgment and thought content normal.          Assessment & Plan:

## 2010-10-30 ENCOUNTER — Inpatient Hospital Stay (HOSPITAL_COMMUNITY): Admission: RE | Admit: 2010-10-30 | Payer: Medicare Other | Source: Ambulatory Visit

## 2010-10-30 ENCOUNTER — Telehealth: Payer: Self-pay | Admitting: *Deleted

## 2010-10-30 ENCOUNTER — Ambulatory Visit (HOSPITAL_COMMUNITY)
Admission: RE | Admit: 2010-10-30 | Discharge: 2010-10-30 | Disposition: A | Discharge status: Home / Self Care | Payer: Medicare Other | Source: Ambulatory Visit | Attending: Internal Medicine | Admitting: Internal Medicine

## 2010-10-30 DIAGNOSIS — J4489 Other specified chronic obstructive pulmonary disease: Secondary | ICD-10-CM | POA: Insufficient documentation

## 2010-10-30 DIAGNOSIS — J449 Chronic obstructive pulmonary disease, unspecified: Secondary | ICD-10-CM

## 2010-10-30 NOTE — Telephone Encounter (Signed)
Tammy called from pulm dept at [facility] and she states she did not see the order to do an ABG on the patient until after the patient left. She wants to know does MR want her to come in again for this. If so does the ABG need to be done on Room Air or 2 liters?  Also Tammy wanted MR to know that the patient was very anxious the entire test and could not do the DLCO or lung volumes. She states the pt refused to complete these parts. Please advise about ABG? Carron Curie, CMA

## 2010-10-31 NOTE — Telephone Encounter (Signed)
Yes, need abg esp because other parts could not be finished

## 2010-10-31 NOTE — Telephone Encounter (Signed)
Called, spoke with Tammy.  She was informed MR would like pt to come back for abg and ideally he would like it on RA but it pulse ox goes below 88% after being on RA for 15 minutes then they can do it on 2L.  Tammy verbalized understanding of this and stated they would call pt to have her come back in.

## 2010-10-31 NOTE — Telephone Encounter (Signed)
Ideally RA but if her pulse ox goes below 88% afte being on RA for 15 minutes then they can do it on 2L

## 2010-10-31 NOTE — Telephone Encounter (Signed)
MR, does abg need to be done on RA or 2L - pls advise.  Thanks!

## 2010-11-02 ENCOUNTER — Ambulatory Visit (INDEPENDENT_AMBULATORY_CARE_PROVIDER_SITE_OTHER): Payer: Medicare Other | Admitting: Adult Health

## 2010-11-02 ENCOUNTER — Encounter: Payer: Self-pay | Admitting: Adult Health

## 2010-11-02 VITALS — BP 130/90 | HR 77 | Temp 97.5°F | Ht 62.5 in | Wt 196.6 lb

## 2010-11-02 DIAGNOSIS — J449 Chronic obstructive pulmonary disease, unspecified: Secondary | ICD-10-CM

## 2010-11-02 NOTE — Progress Notes (Signed)
  Subjective:    Patient ID: [name], female    DOB: 12-Dec-1945, 65 y.o.   MRN: [id number]  HPI 65 yo female with known hx of Severe COPD -O2 depenedent   11/02/10 Follow up and Med review. Pt presents for follow up and med review.  PT did not bring her meds. She was very angry , "I know my med"  I do not need to bring them in. She was offered to reschedule. However she declined and left without being seen .   Review of Systems       Objective:   Physical Exam         Assessment & Plan:

## 2010-11-09 ENCOUNTER — Encounter: Payer: Self-pay | Admitting: Internal Medicine

## 2010-11-09 DIAGNOSIS — J441 Chronic obstructive pulmonary disease with (acute) exacerbation: Secondary | ICD-10-CM | POA: Insufficient documentation

## 2010-11-09 NOTE — Assessment & Plan Note (Signed)
PLAN Once basic med calendar and rehab is established and goals of care are established, will consider darliesp versus even hospice depending on pft/abg results

## 2010-11-09 NOTE — Assessment & Plan Note (Signed)
Noted on ct chest 07/04/2010 and again 09/13/2010 - described as small but increasing. [facility] notes in march 2012 mention getting an echo but I See no echo result. Will get her to cardiology

## 2010-11-09 NOTE — Assessment & Plan Note (Addendum)
On o2 since 2010. Does not recollect PFTs. May 2012 baseline: class 3 dyspnea on exertion for pulling clothes oout of laundry, walking front door to elevator. Recurrent exacerbator. Never been to rehab. Isolated social situation - lives alone. Quit smoking 2005  Plan abg  pft np visit for med calendar Refer pulmonary rehab Cards to asssess pericardial effusion - ? Component of dyspnea At fu check alpha 1

## 2010-11-12 ENCOUNTER — Telehealth: Payer: Self-pay | Admitting: Internal Medicine

## 2010-11-12 NOTE — Telephone Encounter (Signed)
[name], She walked out of TP med calendar visit. Pls have her come and see me. Spiro fev1 0.4L/22% - gold stage 4 end stage copd. I need to discuss with her

## 2010-11-15 ENCOUNTER — Ambulatory Visit (INDEPENDENT_AMBULATORY_CARE_PROVIDER_SITE_OTHER): Payer: Medicare Other | Admitting: Cardiovascular Disease

## 2010-11-15 ENCOUNTER — Encounter: Payer: Self-pay | Admitting: Cardiovascular Disease

## 2010-11-15 DIAGNOSIS — I313 Pericardial effusion (noninflammatory): Secondary | ICD-10-CM

## 2010-11-15 DIAGNOSIS — J90 Pleural effusion, not elsewhere classified: Secondary | ICD-10-CM

## 2010-11-15 DIAGNOSIS — J441 Chronic obstructive pulmonary disease with (acute) exacerbation: Secondary | ICD-10-CM

## 2010-11-15 DIAGNOSIS — R0600 Dyspnea, unspecified: Secondary | ICD-10-CM

## 2010-11-15 DIAGNOSIS — I319 Disease of pericardium, unspecified: Secondary | ICD-10-CM

## 2010-11-15 DIAGNOSIS — R0609 Other forms of dyspnea: Secondary | ICD-10-CM

## 2010-11-15 DIAGNOSIS — J449 Chronic obstructive pulmonary disease, unspecified: Secondary | ICD-10-CM

## 2010-11-15 NOTE — Assessment & Plan Note (Signed)
Never got echo in hospital.  Will order but effusion not significant on CT

## 2010-11-15 NOTE — Progress Notes (Signed)
Referred by ? hospitalist for pericardial effusion.  Patient has  stage 4 copd  Has had 3 admisssions since Oct 2011 for AECOPD - Oct 2011, JAn 2012 and March 2012. Latter two admits PE ruled out each time. Altternative etiologies not found except small but increasing pericardial effusion on CT jan and march 2012  Was supposed to have echo before D/C last hospitalization but it was never done.  Reviewed CT from  3/29 and there was only a small anterior pericardial effusion with no pericardial thickening.  Was Rx with bipap. Has isolated social situation. Quit smoking several years ago. Currently at baseline class 3 dyspnea on exertion with o2 on. Meds reviwed: says advair causes epsitaxis. Appears to be currently on spiriva, and atrovent, albuterol neb and mdi and po prednisone. Denies chest pain, cougn, wheeze. Admits to snoring and excess daytime somnolence but not using cpap; no dx of OSA.  Has chronic LE edema likely from her lung disease and is on diuretics  Discussed advanced directives and indicates DNR status has not previously been discussed by Dr Marchelle Gearing her primary lung doctor.  Discussed lack of family and likely need for ventilator Rx in future.  She is unable to make any current decisions.    No syncope, palpitations, chest pain.  No signs of constriction, or tamponade.  No evidence of pericarditis  ROS: Denies fever, malais, weight loss, blurry vision, decreased visual acuity, cough, sputum, SOB, hemoptysis, pleuritic pain, palpitaitons, heartburn, abdominal pain, melena, lower extremity edema, claudication, or rash.  All other systems reviewed and negative   General: Affect appropriate Healthy:  appears stated age HEENT: normal Neck supple with no adenopathy JVP normal no bruits no thyromegaly Lungs clear with no wheezing and good diaphragmatic motion Heart:  S1/S2 no murmur,rub, gallop or click PMI normal Abdomen: benighn, BS positve, no tenderness, no AAA no bruit.  No HSM or  HJR Distal pulses intact with no bruits No edema Neuro non-focal Skin warm and dry No muscular weakness  Medications Current Outpatient Prescriptions  Medication Sig Dispense Refill  . albuterol (PROVENTIL) (2.5 MG/3ML) 0.083% nebulizer solution Take 2.5 mg by nebulization every 6 (six) hours as needed.        [name] albuterol (VENTOLIN HFA) 108 (90 BASE) MCG/ACT inhaler Inhale 2 puffs into the lungs every 6 (six) hours as needed.        [name] aspirin 81 MG tablet Take 81 mg by mouth daily.        [name] diltiazem (DILACOR XR) 240 MG 24 hr capsule Take 240 mg by mouth daily.        . fish oil-omega-3 fatty acids 1000 MG capsule Take 2 g by mouth daily.        . hydrochlorothiazide 25 MG tablet Take 25 mg by mouth daily.        [name] losartan (COZAAR) 100 MG tablet Take 100 mg by mouth daily.        [name] lovastatin (MEVACOR) 40 MG tablet Take 40 mg by mouth at bedtime.        . metFORMIN (GLUMETZA) 500 MG (MOD) 24 hr tablet Take 500 mg by mouth 2 (two) times daily with a meal.        . tiotropium (SPIRIVA) 18 MCG inhalation capsule Place 18 mcg into inhaler and inhale daily.        [name] DISCONTD: diltiazem (CARDIZEM CD) 180 MG 24 hr capsule         Allergies Penicillins  Family History: Family  History  Problem Relation Age of Onset  . COPD Mother   . COPD Sister   . Heart disease Mother   . Cancer Father   . Diabetes Other     3 maternal aunts  . Prostate cancer Paternal Uncle     Social History: History   Social History  . Marital Status: Single    Spouse Name: N/A    Number of Children: N/A  . Years of Education: N/A   Occupational History  . retired    Social History Main Topics  . Smoking status: Former Smoker -- 1.0 packs/day for 30 years    Types: Cigarettes    Quit date: 06/17/2004  . Smokeless tobacco: Not on file  . Alcohol Use: 1.0 oz/week    2 drink(s) per week  . Drug Use: No     past marijuana use in 1980's.  [name] Sexually Active: Not on file   Other Topics Concern  .  Not on file   Social History Narrative   Lives alone. Kids in [place] but not much contact. One first cousin and 6 second cousins in [place] but no contact. Moved from [place] to [place] in Dec 2011    Electrocardiogram:  NSR 87 LAE LAD low voltage  Assessment and Plan

## 2010-11-15 NOTE — Patient Instructions (Signed)
Your physician recommends that you schedule a follow-up appointment in: AS NEEDED  Your physician has requested that you have an echocardiogram. Echocardiography is a painless test that uses sound waves to create images of your heart. It provides your doctor with information about the size and shape of your heart and how well your heart's chambers and valves are working. This procedure takes approximately one hour. There are no restrictions for this procedure. AT PT'S CONVENIENCE DX PLEURAL EFFUSION.

## 2010-11-15 NOTE — Assessment & Plan Note (Signed)
Stage 4 oxygen dependant.  Discussed advanced directives.  F/U pulmonary.  Echo to assess PA pressures to see if pulmonary should investigate vasodilator Rx

## 2010-11-16 NOTE — Telephone Encounter (Signed)
[id number].Jennifer Castillo, CMA  

## 2010-11-20 ENCOUNTER — Encounter: Payer: Self-pay | Admitting: Internal Medicine

## 2010-11-22 ENCOUNTER — Telehealth: Payer: Self-pay | Admitting: *Deleted

## 2010-11-22 NOTE — Telephone Encounter (Signed)
Pt has an appt on 11-26-10 at 1:45pm.Clothilde [place], CMA

## 2010-11-22 NOTE — Telephone Encounter (Signed)
Error

## 2010-11-22 NOTE — Telephone Encounter (Signed)
Correction. Pt appt is on 12-03-10 at 1:45pm. Pt aware.Carron Curie, CMA \

## 2010-11-28 ENCOUNTER — Other Ambulatory Visit (HOSPITAL_COMMUNITY): Payer: Medicare Other | Admitting: Radiology

## 2010-12-03 ENCOUNTER — Ambulatory Visit: Payer: Medicare Other | Admitting: Internal Medicine

## 2011-01-17 ENCOUNTER — Encounter (HOSPITAL_COMMUNITY): Payer: Self-pay | Admitting: Radiology

## 2011-01-17 ENCOUNTER — Emergency Department (HOSPITAL_COMMUNITY): Payer: Medicare Other

## 2011-01-17 ENCOUNTER — Inpatient Hospital Stay (HOSPITAL_COMMUNITY): Payer: Medicare Other

## 2011-01-17 ENCOUNTER — Inpatient Hospital Stay (HOSPITAL_COMMUNITY)
Admission: EM | Admit: 2011-01-17 | Discharge: 2011-01-22 | DRG: 190 | Disposition: A | Discharge status: Home / Self Care | Payer: Medicare Other | Attending: Internal Medicine | Admitting: Internal Medicine

## 2011-01-17 DIAGNOSIS — E669 Obesity, unspecified: Secondary | ICD-10-CM | POA: Diagnosis present

## 2011-01-17 DIAGNOSIS — Z9119 Patient's noncompliance with other medical treatment and regimen: Secondary | ICD-10-CM

## 2011-01-17 DIAGNOSIS — I319 Disease of pericardium, unspecified: Secondary | ICD-10-CM

## 2011-01-17 DIAGNOSIS — Z9981 Dependence on supplemental oxygen: Secondary | ICD-10-CM

## 2011-01-17 DIAGNOSIS — I10 Essential (primary) hypertension: Secondary | ICD-10-CM | POA: Diagnosis present

## 2011-01-17 DIAGNOSIS — J441 Chronic obstructive pulmonary disease with (acute) exacerbation: Principal | ICD-10-CM | POA: Diagnosis present

## 2011-01-17 DIAGNOSIS — Z87891 Personal history of nicotine dependence: Secondary | ICD-10-CM

## 2011-01-17 DIAGNOSIS — E119 Type 2 diabetes mellitus without complications: Secondary | ICD-10-CM | POA: Diagnosis present

## 2011-01-17 DIAGNOSIS — J9620 Acute and chronic respiratory failure, unspecified whether with hypoxia or hypercapnia: Secondary | ICD-10-CM | POA: Diagnosis present

## 2011-01-17 DIAGNOSIS — Z91199 Patient's noncompliance with other medical treatment and regimen due to unspecified reason: Secondary | ICD-10-CM

## 2011-01-17 DIAGNOSIS — Z7982 Long term (current) use of aspirin: Secondary | ICD-10-CM

## 2011-01-17 LAB — DIFFERENTIAL
Basophils Absolute: 0 10*3/uL (ref 0.0–0.1)
Basophils Relative: 0 % (ref 0–1)
Eosinophils Relative: 3 % (ref 0–5)
Monocytes Absolute: 0.5 10*3/uL (ref 0.1–1.0)
Neutro Abs: 4 10*3/uL (ref 1.7–7.7)

## 2011-01-17 LAB — URINALYSIS, ROUTINE W REFLEX MICROSCOPIC
Leukocytes, UA: NEGATIVE
Nitrite: NEGATIVE
Protein, ur: 30 mg/dL — AB
Specific Gravity, Urine: 1.011 (ref 1.005–1.030)
Urobilinogen, UA: 0.2 mg/dL (ref 0.0–1.0)

## 2011-01-17 LAB — COMPREHENSIVE METABOLIC PANEL
ALT: 23 U/L (ref 0–35)
BUN: 12 mg/dL (ref 6–23)
CO2: 39 mEq/L — ABNORMAL HIGH (ref 19–32)
Calcium: 9.5 mg/dL (ref 8.4–10.5)
Creatinine, Ser: 0.84 mg/dL (ref 0.50–1.10)
GFR calc Af Amer: >60 mL/min (ref >60)
GFR calc non Af Amer: >60 mL/min (ref >60)
Glucose, Bld: 136 mg/dL — ABNORMAL HIGH (ref 70–99)
Sodium: 142 mEq/L (ref 135–145)

## 2011-01-17 LAB — CBC
Hemoglobin: 10.3 g/dL — ABNORMAL LOW (ref 12.0–15.0)
MCHC: 29.9 g/dL — ABNORMAL LOW (ref 30.0–36.0)
RDW: 15.8 % — ABNORMAL HIGH (ref 11.5–15.5)
WBC: 6.8 10*3/uL (ref 4.0–10.5)

## 2011-01-17 LAB — GLUCOSE, CAPILLARY: Glucose-Capillary: 172 mg/dL — ABNORMAL HIGH (ref 70–99)

## 2011-01-17 LAB — MAGNESIUM: Magnesium: 2 mg/dL (ref 1.5–2.5)

## 2011-01-17 LAB — URINE MICROSCOPIC-ADD ON

## 2011-01-17 LAB — CARDIAC PANEL(CRET KIN+CKTOT+MB+TROPI)
CK, MB: 9.2 ng/mL (ref 0.3–4.0)
Relative Index: 2.3 (ref 0.0–2.5)
Total CK: 397 U/L — ABNORMAL HIGH (ref 7–177)
Troponin I: <0.3 ng/mL (ref <0.30)

## 2011-01-17 LAB — BASIC METABOLIC PANEL
GFR calc Af Amer: >60 mL/min (ref >60)
GFR calc non Af Amer: >60 mL/min (ref >60)
Potassium: 3.5 mEq/L (ref 3.5–5.1)
Sodium: 142 mEq/L (ref 135–145)

## 2011-01-17 MED ORDER — IOHEXOL 350 MG/ML SOLN
80.0000 mL | Freq: Once | INTRAVENOUS | Status: AC | PRN
  Administered 2011-01-17: 80 mL via INTRAVENOUS

## 2011-01-18 ENCOUNTER — Inpatient Hospital Stay (HOSPITAL_COMMUNITY): Payer: Medicare Other

## 2011-01-18 DIAGNOSIS — I319 Disease of pericardium, unspecified: Secondary | ICD-10-CM

## 2011-01-18 DIAGNOSIS — R0602 Shortness of breath: Secondary | ICD-10-CM

## 2011-01-18 DIAGNOSIS — R609 Edema, unspecified: Secondary | ICD-10-CM

## 2011-01-18 LAB — BLOOD GAS, ARTERIAL
Bicarbonate: 39.9 mEq/L — ABNORMAL HIGH (ref 20.0–24.0)
Patient temperature: 98.6
pH, Arterial: 7.32 — ABNORMAL LOW (ref 7.350–7.400)

## 2011-01-18 LAB — GLUCOSE, CAPILLARY
Glucose-Capillary: 130 mg/dL — ABNORMAL HIGH (ref 70–99)
Glucose-Capillary: 217 mg/dL — ABNORMAL HIGH (ref 70–99)

## 2011-01-18 LAB — CARDIAC PANEL(CRET KIN+CKTOT+MB+TROPI): Total CK: 393 U/L — ABNORMAL HIGH (ref 7–177)

## 2011-01-19 ENCOUNTER — Inpatient Hospital Stay (HOSPITAL_COMMUNITY): Payer: Medicare Other

## 2011-01-19 DIAGNOSIS — J9620 Acute and chronic respiratory failure, unspecified whether with hypoxia or hypercapnia: Secondary | ICD-10-CM

## 2011-01-19 DIAGNOSIS — I509 Heart failure, unspecified: Secondary | ICD-10-CM

## 2011-01-19 DIAGNOSIS — J441 Chronic obstructive pulmonary disease with (acute) exacerbation: Secondary | ICD-10-CM

## 2011-01-19 LAB — COMPREHENSIVE METABOLIC PANEL WITH GFR
ALT: 22 U/L (ref 0–35)
AST: 20 U/L (ref 0–37)
Albumin: 3.3 g/dL — ABNORMAL LOW (ref 3.5–5.2)
Alkaline Phosphatase: 70 U/L (ref 39–117)
BUN: 26 mg/dL — ABNORMAL HIGH (ref 6–23)
CO2: 39 meq/L — ABNORMAL HIGH (ref 19–32)
Calcium: 9.1 mg/dL (ref 8.4–10.5)
Chloride: 91 meq/L — ABNORMAL LOW (ref 96–112)
Creatinine, Ser: 1.03 mg/dL (ref 0.50–1.10)
GFR calc Af Amer: >60 mL/min
GFR calc non Af Amer: 54 mL/min — ABNORMAL LOW
Glucose, Bld: 163 mg/dL — ABNORMAL HIGH (ref 70–99)
Potassium: 3.8 meq/L (ref 3.5–5.1)
Sodium: 136 meq/L (ref 135–145)
Total Bilirubin: 0.1 mg/dL — ABNORMAL LOW (ref 0.3–1.2)
Total Protein: 6.6 g/dL (ref 6.0–8.3)

## 2011-01-19 LAB — GLUCOSE, CAPILLARY
Glucose-Capillary: 156 mg/dL — ABNORMAL HIGH (ref 70–99)
Glucose-Capillary: 123 mg/dL — ABNORMAL HIGH (ref 70–99)
Glucose-Capillary: 150 mg/dL — ABNORMAL HIGH (ref 70–99)
Glucose-Capillary: 152 mg/dL — ABNORMAL HIGH (ref 70–99)

## 2011-01-19 LAB — CBC
MCH: 27.9 pg (ref 26.0–34.0)
Platelets: 258 10*3/uL (ref 150–400)
RBC: 3.73 MIL/uL — ABNORMAL LOW (ref 3.87–5.11)
RDW: 15.7 % — ABNORMAL HIGH (ref 11.5–15.5)

## 2011-01-20 LAB — GLUCOSE, CAPILLARY
Glucose-Capillary: 159 mg/dL — ABNORMAL HIGH (ref 70–99)
Glucose-Capillary: 115 mg/dL — ABNORMAL HIGH (ref 70–99)

## 2011-01-21 DIAGNOSIS — J441 Chronic obstructive pulmonary disease with (acute) exacerbation: Secondary | ICD-10-CM

## 2011-01-21 DIAGNOSIS — I509 Heart failure, unspecified: Secondary | ICD-10-CM

## 2011-01-21 DIAGNOSIS — J9620 Acute and chronic respiratory failure, unspecified whether with hypoxia or hypercapnia: Secondary | ICD-10-CM

## 2011-01-21 LAB — BASIC METABOLIC PANEL
CO2: 39 mEq/L — ABNORMAL HIGH (ref 19–32)
GFR calc non Af Amer: 58 mL/min — ABNORMAL LOW (ref >60)
Glucose, Bld: 134 mg/dL — ABNORMAL HIGH (ref 70–99)
Potassium: 3.9 mEq/L (ref 3.5–5.1)
Sodium: 142 mEq/L (ref 135–145)

## 2011-01-21 LAB — CBC
HCT: 39.3 % (ref 36.0–46.0)
Hemoglobin: 11.9 g/dL — ABNORMAL LOW (ref 12.0–15.0)
WBC: 6.4 10*3/uL (ref 4.0–10.5)

## 2011-01-21 LAB — GLUCOSE, CAPILLARY
Glucose-Capillary: 134 mg/dL — ABNORMAL HIGH (ref 70–99)
Glucose-Capillary: 154 mg/dL — ABNORMAL HIGH (ref 70–99)
Glucose-Capillary: 230 mg/dL — ABNORMAL HIGH (ref 70–99)
Glucose-Capillary: 170 mg/dL — ABNORMAL HIGH (ref 70–99)

## 2011-01-22 DIAGNOSIS — I319 Disease of pericardium, unspecified: Secondary | ICD-10-CM

## 2011-01-22 LAB — GLUCOSE, CAPILLARY: Glucose-Capillary: 107 mg/dL — ABNORMAL HIGH (ref 70–99)

## 2011-01-29 ENCOUNTER — Encounter: Payer: Self-pay | Admitting: Adult Health

## 2011-01-29 ENCOUNTER — Ambulatory Visit (INDEPENDENT_AMBULATORY_CARE_PROVIDER_SITE_OTHER): Payer: Medicare Other | Admitting: Adult Health

## 2011-01-29 VITALS — BP 126/72 | HR 71 | Temp 98.4°F | Ht 62.0 in | Wt 193.6 lb

## 2011-01-29 DIAGNOSIS — J449 Chronic obstructive pulmonary disease, unspecified: Secondary | ICD-10-CM

## 2011-01-29 NOTE — Patient Instructions (Signed)
Begin Symbicort 80/4.21mcg 2 puffs Twice daily  -brush/rinse and gargle after use.  Continue on Spiriva daily - 1 puff  Wear Oxygen 2 l/m at all times.  follow up Dr. Marchelle Gearing in 2-3 weeks and As needed   Please contact office for sooner follow up if symptoms do not improve or worsen or seek emergency care

## 2011-01-29 NOTE — Progress Notes (Signed)
  Subjective:    Patient ID: [name], female    DOB: 1946/01/28, 65 y.o.   MRN: [id number]  HPI  65 yo female with known hx of hx of Severe COPD -O2 depenedent   11/02/10 Follow up and Med review. Pt presents for follow up and med review.  PT did not bring her meds. She was very angry , "I know my med"  I do not need to bring them in. She was offered to reschedule. However she declined and left without being seen .   01/29/2011 Post Hospital  Pt returns for post hospital visit. Admitted 8/2-01/22/11 for AECOPD, Hypercarbic Resp Failure, sm-mod pericardial effusion w/ mod to severe right atrial collapse. She was tx with IV abx , steroids . She was seen by [facility] with plans for OP follow up . Discharged on Prednisone taper and Avelox x 4 days. Since discharge she is feeling better with less dyspnea and cough. She has few days left of prednisone. Did not take the Avelox due to cost.   Has follow up with [facility] with cards in 2 weeks . Not taking symbicort. Has several inhalers but does not know their names.  Her meds today are consistent with her discharge list and medication discharge papers. She says she has had changes since discharge.  We updated her MAR on our EMR as best as possible.   Review of Systems Constitutional:   No  weight loss, night sweats,  Fevers, chills, fatigue, or  lassitude.  HEENT:   No headaches,  Difficulty swallowing,  Tooth/dental problems, or  Sore throat,                No sneezing, itching, ear ache, nasal congestion, post nasal drip,   CV:  No chest pain,  Orthopnea, PND, swelling in lower extremities, anasarca, dizziness, palpitations, syncope.   GI  No heartburn, indigestion, abdominal pain, nausea, vomiting, diarrhea, change in bowel habits, loss of appetite, bloody stools.   Resp:   No coughing up of blood.  No change in color of mucus.  No wheezing.  No chest wall deformity  Skin: no rash or lesions.  GU: no dysuria, change in color of urine, no  urgency or frequency.  No flank pain, no hematuria   MS:  No joint pain or swelling.  No decreased range of motion.  No back pain.  Psych:  No change in mood or affect. No depression or anxiety.  No memory loss.            Objective:   Physical Exam GEN: A/Ox3; pleasant , NAD, chronically ill appearing.   HEENT:  [place]/AT,  EACs-clear, TMs-wnl, NOSE-clear, THROAT-clear, no lesions, no postnasal drip or exudate noted.   NECK:  Supple w/ fair ROM; no JVD; normal carotid impulses w/o bruits; no thyromegaly or nodules palpated; no lymphadenopathy.  RESP  Coarse BS w/ no wheezing . no accessory muscle use, no dullness to percussion  CARD:  RRR, no m/r/g  , 1+ peripheral edema, pulses intact, no cyanosis or clubbing.  GI:   Soft & nt; nml bowel sounds; no organomegaly or masses detected.  Musco: Warm bil, no deformities or joint swelling noted.   Neuro: alert, no focal deficits noted.    Skin: Warm, no lesions or rashes           Assessment & Plan:

## 2011-01-29 NOTE — Assessment & Plan Note (Signed)
REcent flare with associated hypercapnic resp failure  Now improving  Plan Finish pred.  Begin Symbicort 80  (restart).  O2  follow up Dr. Marchelle Gearing in 2 weeks  Med education

## 2011-01-30 NOTE — Consult Note (Signed)
NAME:  MICKIE, BADDERS NO.:  [id number]  MEDICAL RECORD NO.:  [id number]  LOCATION:  3311                         FACILITY:  [facility]  PHYSICIAN:  Vesta Mixer, M.D. DATE OF BIRTH:  [birth year]  DATE OF CONSULTATION:  01/18/2011 DATE OF DISCHARGE:                                CONSULTATION   HISTORY OF PRESENT ILLNESS:  [name] is a 65 year old black female with a history of severe COPD.  She is on home oxygen.  She has a history of hypertension, obesity, and diabetes mellitus.  She has a history of shortness of breath all the time.  She has had a recent worsening of her shortness breath and was brought into the hospital. She was incidentally known to have a pericardial effusion on echo with evidence of right atrial collapse.  We were called urgently to see her for that reason.  When I arrived to see the patient, she was extremely dyspneic.  She was unable to give me any history at all.  She was unable to lie flat.  She was breathing but around 30 times per minute.  She denied any chest pain.  She just had severe shortness of breath and inability to catch her breath.  All details of the history, family history, social history, review of systems were all obtained with chart view and were not able to be obtained from the patient.  CURRENT MEDICATIONS: 1. Solu-Medrol. 2. Aspirin. 3. Ventolin. 4. Atrovent. 5. Guaifenesin. 6. Hydrochlorothiazide 25 mg a day. 7. Diltiazem 180 mg a day. 8. Insulin sliding scale. 9. Lasix 40 mg a day. 10.Losartan 100 mg a day. 11.Potassium chloride 40 mEq this morning.  ALLERGIES:  She is allergic to PENICILLIN.  PAST MEDICAL HISTORY: 1. 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 2. Hypertension. 3. Diabetes mellitus. 4. History of hypothyroidism.  SOCIAL HISTORY:  The patient has a long history of cigarette smoking but quit several years ago.  FAMILY HISTORY:  Positive for diabetes mellitus.  REVIEW OF  SYSTEMS:  As noted in the HPI.  No additional review of systems could be obtained given the patient's acute respiratory distress.  PHYSICAL EXAMINATION:  GENERAL:  She is a middle-aged black female.  She was sitting bolt upright.  She was unable to lie down.  She was breathing quite fast. VITAL SIGNS:  Her heart rate was around 120.  Her blood pressure was 166/90.  She is afebrile. HEENT:  She has a thick neck.  I could not assess her JVD.  Her mucous membranes are moist.  Sclerae are nonicteric. LUNGS:  Decreased breath sounds, especially on the left upper lung fields.  She did have some breath sounds in the left lower lung field. She had mildly decreased breath sounds on the right side. BACK:  Nontender. HEART:  Regular rate, S1-S2.  She has a 2/6 systolic ejection murmur at the left sternal border.  Her heart sounds were not distant. ABDOMEN:  Morbidly obese.  She has no hepatosplenomegaly.  There was no guarding or rebound. EXTREMITIES:  She has 1-2+ pitting edema.  LABORATORY DATA:  Blood gas that reveals a pH of 7.32, her pCO2 was 79, and her pO2 was 78.  Review  of her chest x-ray reveals that she has massive cardiomegaly consistent with a water bottle shaped heart.  This is certainly worrisome for pericarditis/pericardial effusion.  Her CT scan reveals a moderate-sized pericardial effusion.  The echocardiogram reveals only a small to moderate-sized pericardial effusion.  Her cardiac enzymes are negative.  She has mild-to-moderate elevation of her total CPK but the cardiac index is normal.  Her BNP is 195.  Her white blood cell count is 6.8, hemoglobin is 10.3, hematocrit is 34.4, and platelet count is 237.  Sodium is 142, potassium is 3.4, chloride is 96, CO2 is 39, glucose is 135, BUN is 11, and creatinine is 0.82.  IMPRESSION AND PLAN: 1. Severe respiratory distress.  I suspect that this is mostly due to     chronic obstructive pulmonary disease.  Her pCO2 is quite  elevated     despite the fact that she is breathing 30 times a minute.  She is     also still somewhat acidotic.  I suspect that she has a chronic CO2     retention.  I have suggested that we get a Pulmonary consultation.     She needs aggressive treatment of her chronic obstructive pulmonary     disease. 2. Pericardial effusion.  The patient has a definite pericardial     effusion by CT scan and by echocardiography.  The echo images were     somewhat difficult and we never get a very good look at her     pericardial effusion.  It does not appear to be all that big by     echo.  She certainly has some degree of right atrial collapse,     although this may be due to her extreme respiratory effort.  The     pericardial effusion appears to be some somewhat larger by CT scan.     She has certainly improved after receiving 500 mL of normal saline     fluid.  At this point, the pericardial effusion is too small to consider pericardicentesis.  It is also too small I think to warrant a pericardial window and I do not think that this is her main problem.  We do not have an etiology for this pericardial effusion.  She is better after a little bit of IV fluids.  Her blood pressure is good.  It does not appear that she was ever in any hemodynamic compromise.  I doubt that pericardial tamponade with impending cardiogenic shock is her main problem.  She is also fairly obese.  I suspect that she has some degree of restrictive lung defect as a significant cause of her pulmonary issues.     Vesta Mixer, M.D.     PJN/MEDQ  D:  01/18/2011  T:  01/18/2011  Job:  [id number]  cc:   Bufford Spikes, DO  Electronically Signed by Kristeen Miss M.D. on 01/30/2011 05:37:10 PM

## 2011-02-14 ENCOUNTER — Encounter: Payer: Self-pay | Admitting: Cardiovascular Disease

## 2011-02-14 ENCOUNTER — Ambulatory Visit (INDEPENDENT_AMBULATORY_CARE_PROVIDER_SITE_OTHER): Payer: Medicare Other | Admitting: Cardiovascular Disease

## 2011-02-14 ENCOUNTER — Ambulatory Visit (HOSPITAL_COMMUNITY): Payer: Medicare Other | Attending: Cardiovascular Disease | Admitting: Radiology

## 2011-02-14 VITALS — BP 143/91 | HR 94 | Resp 18 | Ht 62.0 in | Wt 187.0 lb

## 2011-02-14 DIAGNOSIS — I319 Disease of pericardium, unspecified: Secondary | ICD-10-CM | POA: Insufficient documentation

## 2011-02-14 DIAGNOSIS — J449 Chronic obstructive pulmonary disease, unspecified: Secondary | ICD-10-CM

## 2011-02-14 DIAGNOSIS — E785 Hyperlipidemia, unspecified: Secondary | ICD-10-CM | POA: Insufficient documentation

## 2011-02-14 DIAGNOSIS — I059 Rheumatic mitral valve disease, unspecified: Secondary | ICD-10-CM | POA: Insufficient documentation

## 2011-02-14 DIAGNOSIS — I313 Pericardial effusion (noninflammatory): Secondary | ICD-10-CM

## 2011-02-14 DIAGNOSIS — M7989 Other specified soft tissue disorders: Secondary | ICD-10-CM | POA: Insufficient documentation

## 2011-02-14 DIAGNOSIS — I079 Rheumatic tricuspid valve disease, unspecified: Secondary | ICD-10-CM | POA: Insufficient documentation

## 2011-02-14 DIAGNOSIS — E782 Mixed hyperlipidemia: Secondary | ICD-10-CM

## 2011-02-14 NOTE — Assessment & Plan Note (Addendum)
F/U echo Seems at baseline with no JVP elevation and no increased SOB  She seems to have some dementia and does not recall doctors in hospital or F/U appt times for echo.  Will try to do today while here

## 2011-02-14 NOTE — Assessment & Plan Note (Signed)
Cholesterol is at goal.  Continue current dose of statin and diet Rx.  No myalgias or side effects.  F/U  LFT's in 6 months. No results found for this basename: LDLCALC             

## 2011-02-14 NOTE — Patient Instructions (Signed)
Your physician recommends that you schedule a follow-up appointment in: DR NASHER IN 1-2 MONTHS  Your physician has requested that you have an echocardiogram. Echocardiography is a painless test that uses sound waves to create images of your heart. It provides your doctor with information about the size and shape of your heart and how well your heart's chambers and valves are working. This procedure takes approximately one hour. There are no restrictions for this procedure. TODAY IF POSSIBLE

## 2011-02-14 NOTE — Assessment & Plan Note (Signed)
Continue oxygen and inhalers.  Primary quality of life issue Gold Stage 4 COPD

## 2011-02-14 NOTE — Progress Notes (Signed)
Patient of Dr Elease Hashimoto just seen in hospital 01/18/11 . Patient has stage 4 copd Has had 3 admisssions since Oct 2011 for AECOPD - Oct 2011, JAn 2012 and March 2012. Latter two admits PE ruled out each time. Altternative etiologies not found except small but increasing pericardial effusion on CT jan and march 2012 Was supposed to have echo before D/C last hospitalization but it was never done. Reviewed CT from 3/29 and there was only a small anterior pericardial effusion with no pericardial thickening. Was Rx with bipap. Has isolated social situation. Quit smoking several years ago. Currently at baseline class 3 dyspnea on exertion with o2 on. Meds reviwed: says advair causes epsitaxis. Appears to be currently on spiriva, and atrovent, albuterol neb and mdi and po prednisone. Denies chest pain, cougn, wheeze. Admits to snoring and excess daytime somnolence but not using cpap; no dx of OSA. Has chronic LE edema likely from her lung disease and is on diuretics  Discussed advanced directives and indicates DNR status has not previously been discussed by Dr Marchelle Gearing her primary lung doctor. Discussed lack of family and likely need for ventilator Rx in future. She is unable to make any current decisions.  No syncope, palpitations, chest pain. No signs of constriction, or tamponade. No evidence of pericarditis   ROS: Denies fever, malais, weight loss, blurry vision, decreased visual acuity, cough, sputum, SOB, hemoptysis, pleuritic pain, palpitaitons, heartburn, abdominal pain, melena, lower extremity edema, claudication, or rash.  All other systems reviewed and negative  General: Affect appropriate Chronically ill with oxygen HEENT: normal Neck supple with no adenopathy JVP normal no bruits no thyromegaly Lungs clear with no wheezing and good diaphragmatic motion Heart:  S1/S2 no murmur,rub, gallop or click PMI normal Abdomen: benighn, BS positve, no tenderness, no AAA no bruit.  No HSM or HJR Distal pulses  intact with no bruits Plus one bilateral edema Neuro non-focal Skin warm and dry No muscular weakness   Current Outpatient Prescriptions  Medication Sig Dispense Refill  . albuterol (PROVENTIL) (2.5 MG/3ML) 0.083% nebulizer solution Take 2.5 mg by nebulization every 6 (six) hours as needed.        [name] albuterol-ipratropium (COMBIVENT) 18-103 MCG/ACT inhaler Inhale 2 puffs into the lungs every 6 (six) hours as needed.        [name] aspirin 81 MG tablet Take 81 mg by mouth daily.        . budesonide-formoterol (SYMBICORT) 80-4.5 MCG/ACT inhaler Inhale 2 puffs into the lungs 2 (two) times daily.        . hydrochlorothiazide 25 MG tablet Take 25 mg by mouth daily.        [name] lovastatin (MEVACOR) 40 MG tablet Take 40 mg by mouth at bedtime.        . metFORMIN (GLUMETZA) 500 MG (MOD) 24 hr tablet Take 500 mg by mouth 2 (two) times daily with a meal.        . tiotropium (SPIRIVA) 18 MCG inhalation capsule Place 18 mcg into inhaler and inhale daily.          Allergies  Lisinopril and Penicillins  Electrocardiogram:  Assessment and Plan

## 2011-02-21 ENCOUNTER — Other Ambulatory Visit: Payer: Self-pay | Admitting: Internal Medicine

## 2011-02-21 DIAGNOSIS — Z1231 Encounter for screening mammogram for malignant neoplasm of breast: Secondary | ICD-10-CM

## 2011-02-21 DIAGNOSIS — Z780 Asymptomatic menopausal state: Secondary | ICD-10-CM

## 2011-02-28 ENCOUNTER — Ambulatory Visit (INDEPENDENT_AMBULATORY_CARE_PROVIDER_SITE_OTHER): Payer: Medicare Other | Admitting: Internal Medicine

## 2011-02-28 ENCOUNTER — Encounter: Payer: Self-pay | Admitting: Internal Medicine

## 2011-02-28 VITALS — BP 140/80 | HR 106 | Temp 98.5°F

## 2011-02-28 DIAGNOSIS — J449 Chronic obstructive pulmonary disease, unspecified: Secondary | ICD-10-CM

## 2011-02-28 NOTE — Patient Instructions (Addendum)
We will do prescription for scooter or check with pmd if he/she can do You could consider a home hospice program that could benefit your quality of life, shortness of breath, symptoms  Congrats on flu shot Return in 8 weeks to report progress Have spirometry on return

## 2011-02-28 NOTE — Progress Notes (Signed)
  Subjective:    Patient ID: [name], female    DOB: Aug 19, 1945, 65 y.o.   MRN: [id number]  HPI 65 yo female with known hx of  1. Severe/ very severe  COPD -O2 depenedent (advair causes epistaxis)  2. Recurrent AECOPD -  3 admisssions since Oct 2011 for AECOPD - Oct 2011, JAn 2012 and March 2012 and August 2012.  3. Small but increasing pericardial effusion on CT jan and march 2012. Moderate Rt atrial collapse in August 2012 - Dr Elease Hashimoto since Aug 2012 4. Isolated social situation.  5. Ex smoker  6.hx of snoring and excess daytime somnolence but not using cpap; no dx of OSA.    OV 02/27/11: Followup for above issues. Last seen by NP 1 months ago for med calendar folliwng her aecopd admit month ago. Currently states dyspnea is stable class 3. No change in cough and sputum. She wants a scooter for increased mobility. In talking to her she lives alone without friends or family. She is doing ADLs and groceries all by herself. States walking from parking lot to fron t entrance of [employer] makes her severely dyspneic and unable to proceed further. She wishes she had new lungs but realizes and does not want option of transplant. She desires improved quality of life and improved dyspnea and able to attend church which she stopped several years ago    Review of Systems  Constitutional: Negative for fever and unexpected weight change.  HENT: Negative for ear pain, nosebleeds, congestion, sore throat, rhinorrhea, sneezing, trouble swallowing, dental problem, postnasal drip and sinus pressure.   Eyes: Negative for redness and itching.  Respiratory: Positive for shortness of breath. Negative for cough, chest tightness and wheezing.   Cardiovascular: Negative for palpitations and leg swelling.  Gastrointestinal: Negative for nausea and vomiting.  Genitourinary: Negative for dysuria.  Musculoskeletal: Negative for joint swelling.  Skin: Negative for rash.  Neurological: Negative for headaches.    Hematological: Does not bruise/bleed easily.  Psychiatric/Behavioral: Negative for dysphoric mood. The patient is not nervous/anxious.        Objective:   Physical Exam GEN: A/Ox3; pleasant , NAD, chronically ill appearing.   HEENT:  [place]/AT,  EACs-clear, TMs-wnl, NOSE-clear, THROAT-clear, no lesions, no postnasal drip or exudate noted.   NECK:  Supple w/ fair ROM; no JVD; normal carotid impulses w/o bruits; no thyromegaly or nodules palpated; no lymphadenopathy.  RESP  Coarse BS w/ no wheezing . no accessory muscle use, no dullness to percussion  CARD:  RRR, no m/r/g  , 1+ peripheral edema, pulses intact, no cyanosis or clubbing.  GI:   Soft & nt; nml bowel sounds; no organomegaly or masses detected.  Musco: Warm bil, no deformities or joint swelling noted.   Neuro: alert, no focal deficits noted.    Skin: Warm, no lesions or rashes        Assessment & Plan:

## 2011-02-28 NOTE — Assessment & Plan Note (Signed)
20 minuts spent of this 25 minute visit counseling. She meets eligibilty criteria for hospice. WE dicussed goals - she is in spiritual pain and desires improved quality of life. Realized improved quantity might not be possible. Explained medicare hosospice benefits which she sureley qualifies for. Explained benefits of costs, longevity and improvements in quality and support systems and potentially being able to attend church. She wants to think about it. Currently only wants scooter which I think she should get. Will see if her PMD can facilitate it. Check fev1 next visit

## 2011-03-11 ENCOUNTER — Telehealth: Payer: Self-pay | Admitting: Internal Medicine

## 2011-03-11 NOTE — Telephone Encounter (Signed)
Currently out of symbicort 80 but pt was given 2 samples of spiriva.  Lot # [id number] A Exp Date July 2013

## 2011-03-13 NOTE — H&P (Signed)
NAME:  [name], [name] NO.:  [id number]  MEDICAL RECORD NO.:  [id number]  LOCATION:  [facility]                         FACILITY:  [facility]  PHYSICIAN:  Carney Saxton Bosie Helper, MD      DATE OF BIRTH:  21-Jul-1945  DATE OF ADMISSION:  01/17/2011 DATE OF DISCHARGE:                             HISTORY & PHYSICAL   CHIEF COMPLAINT:  Shortness of breath.  HISTORY OF PRESENT ILLNESS:  This is a 65 year old African American female with a history of COPD on home oxygen 2 liters, history of diabetes and hypertension, obesity.  The patient has a baseline shortness of breath despite that she lives alone.  As per her, she gets shortness of breath on less than half block.  The patient presented here with progressive shortness of breath for the last 3-4 days associated with occasional dry cough and facial swelling.  The facial swelling mainly at her lips.  The patient tried her nebs treatment with no significant improvement.  The patient denies any active wheezing.  The patient denies any fever, chills, cough.  Denies any nausea, vomiting, or change in bowel habit.  She noticed her left upper lip is swollen since yesterday.  The patient admitted there is no significant improvement with using her nebs treatment.  PAST MEDICAL HISTORY: 1. 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 2. Hypertension. 3. Diabetes mellitus. 4. History of subclinical hyperthyroidism.  ALLERGIES:  PENICILLIN.  SOCIAL HISTORY:  The patient lives at home, but she is independent but she has to struggle with her shortness of breath.  Former smoker, quit more than 5 years.  She used to smoke one pack per day for 38 years. She drinks alcohol 1-2 hard drinks per day.  No history of illicit drug abuse.  FAMILY HISTORY:  Significant for diabetes mellitus.  REVIEW OF SYSTEMS:  Twelve systems reviewed, negative except per HPI. The patient admitted she has chronic lower extremity swelling and she is on diuretics.  PHYSICAL  EXAMINATION:  VITAL SIGNS:  Temperature 98.5, blood pressure 161/94, pulse rate 104, respiratory rate 16, saturating 95% on 3 liters. GENERAL:  She is obese [place] American female, no acute distress at this time, not using her accessory muscles and able to speak in full sentence. HEENT:  Pupils equal and reactive to light and accommodation. Extraocular muscle movement within normal. NECK:  Supple.  No lymphadenopathy.  No JVP. HEART:  S1 and S2, tachycardic. LUNGS:  Decreased air movement in all domes, but no wheezing. ABDOMEN:  Obese, soft, nontender.  Positive bowel sounds.  No organomegaly. EXTREMITIES:  Bilateral lower limb edema.  LABORATORY DATA:  Blood workup, cardiac enzyme x1 negative.  CBC; white blood cell 6.8, hemoglobin 10.3, hematocrit 34.4, platelet 237.  Sodium 142, potassium 3.5, chloride 97, CO2 of 39, glucose 135, BUN 11, creatinine 1.82.  IMAGING:  Chest x-ray, cardiomegaly without CHF, mild hyperinflation, pulmonary artery enlargement suggesting pulmonary arterial hypertension.  ASSESSMENT AND PLAN: 1. Shortness of breath likely secondary to chronic obstructive     pulmonary disease exacerbation and the patient's baseline decreased     mobility and activity secondary to shortness of breath, but as per     the patient is progressively worsening.  The patient will  be     admitted to telemetry.  We will start the patient on Solu-Medrol,     nebs treatment, Robitussin, and Mucinex. 2. Hypertension, seem uncontrolled.  We will continue with Cardizem,     hydrochlorothiazide, and Cozaar.  The patient is tachycardic and in     hospital will start Cardizem. 3. Bilateral lower extremity swelling.  This could be secondary to     right heart failure from pulmonary hypertension and chronic     obstructive pulmonary disease.  We will proceed with proBNP and     checking her 2-D echo.  Chest x-ray did show significant     cardiomegaly.  The patient has small pericardial  effusion in the     past, which will likely to be investigated on this admission with 2-     D echo.  We will get venous Doppler of the patient's bilateral     lower extremity.     Eliyah Mcshea Bosie Helper, MD     HIE/MEDQ  D:  01/17/2011  T:  01/17/2011  Job:  [id number]  cc:   Bufford Spikes, DO  Electronically Signed by Ebony Cargo MD on 03/13/2011 10:25:02 AM

## 2011-03-13 NOTE — Discharge Summary (Signed)
NAME:  [name], [name]          ACCOUNT NO.:  [id number]  MEDICAL RECORD NO.:  [id number]  LOCATION:  3743                         FACILITY:  [facility]  PHYSICIAN:  Mattix Imhof I Noni Stonesifer, MD      DATE OF BIRTH:  [birth year]  DATE OF ADMISSION:  01/17/2011 DATE OF DISCHARGE:  01/22/2011                              DISCHARGE SUMMARY   DISCHARGE DIAGNOSES: 1. Stage IV chronic obstructive pulmonary disease with exacerbation. 2. Acute-on-chronic hypercapnic respiratory failure. 3. Small-to-moderate pericardial effusion. 4. Moderate-to-severe right atrial collapse, this will be followed up     by Dr. Kristeen Miss as outpatient. 5. Hypertension. 6. Diabetes mellitus. 7. Obesity. 8. Tachycardia sinus secondary to shortness of breath.  CONSULTATION:  Pulmonary consulted and Cardiology consulted.  PROCEDURE: 1. Chest x-ray cardiomegaly without congestive heart failure, mild     hyperinflation, pulmonary artery enlargement suggesting pulmonary     artery hypertension. 2. CT angio negative for PE cardiomegaly with moderate-sized     pericardial effusion.  The pericardial effusion has increased in     size slightly with emphysema and evidence of pulmonary artery     hypertension. 3. Chest x-ray improved bibasilar atelectasis, cardiomegaly without     volume overload.  A 2-D echo systolic function within normal.  EF     55% to 60%, small-to-moderate size pericardial effusion     predominantly centered around right atrium with moderate-to-severe     right atrial collapse, no right ventricular collapse.  A DC     collapses effusion appear unchanged.  2-D echo done during this     admission.  HISTORY OF PRESENT ILLNESS:  This is a 65 year old female with history of stage IV COPD on home oxygen noncompliance with followup with Dr. Marchelle Gearing presented with shortness of breath and coughing diagnosed as COPD with exacerbation.  The patient was started on nebs treatment, Solu- Medrol.  Next day, the  patient developed severe shortness of breath requiring transferred to stepdown for observation.  ABG suggest acute-on- chronic hypercapnic respiratory failure.  Secondary to the possibility of pericardial effusion, Cardiology consulted.  At this time, Cardiology feel the pericardial effusion is small-to-moderate and there is no need for any intervention.  Also, the echo suggest right atrium moderate-to- severe collapse, this is felt to be secondary to accumulation of the fluid around in that area.  If this case discussed with Dr. Kristeen Miss and discussed with Dr. Clifton James.  Pulmonary suggests outpatient followup.  Accordingly, the patient will be discharged with her medication, 1. Prednisone taper dose. 2. Aspirin 81 mg p.o. daily. 3. Avelox 500 mg p.o. daily for 4 days. 4. Albuterol neb solution one nebs every 2 hour as needed. 5. Combivent 2 puff every 4 hour as needed. 6. Cozaar 100 mg. 7. Diltiazem HCT 180 mg. 8. Glucophage 500 mg twice daily. 9. Mucinex 600 mg twice daily. 10.Hydrochlorothiazide 25 mg daily. 11.Multivitamin. 12.Protonix 40 mg daily. 13.Spiriva 18 mcg inhaler daily. 14.Symbicort 2 puffs twice daily. 15.Ambien 5 mg p.o. daily.  The patient have some angioedema on the lips and the lisinopril discontinued this admission.  The patient will be discharged with home oxygen.  The patient advised to follow up with Dr. Kristeen Miss and  followup with pulmonologist on January 29, 2011, at 10:15 a.m.     Patsy Varma Bosie Helper, MD     HIE/MEDQ  D:  01/22/2011  T:  01/22/2011  Job:  [id number]  cc:   Vesta Mixer, M.D. Kalman Shan, MD  Electronically Signed by Ebony Cargo MD on 03/13/2011 10:25:13 AM

## 2011-03-16 ENCOUNTER — Inpatient Hospital Stay (HOSPITAL_COMMUNITY)
Admission: EM | Admit: 2011-03-16 | Discharge: 2011-03-17 | DRG: 192 | Disposition: A | Discharge status: Home / Self Care | Payer: Medicare Other | Attending: Internal Medicine | Admitting: Internal Medicine

## 2011-03-16 ENCOUNTER — Emergency Department (HOSPITAL_COMMUNITY): Payer: Medicare Other

## 2011-03-16 DIAGNOSIS — E669 Obesity, unspecified: Secondary | ICD-10-CM | POA: Diagnosis present

## 2011-03-16 DIAGNOSIS — Z9981 Dependence on supplemental oxygen: Secondary | ICD-10-CM

## 2011-03-16 DIAGNOSIS — E0580 Other thyrotoxicosis without thyrotoxic crisis or storm: Secondary | ICD-10-CM | POA: Diagnosis present

## 2011-03-16 DIAGNOSIS — I10 Essential (primary) hypertension: Secondary | ICD-10-CM | POA: Diagnosis present

## 2011-03-16 DIAGNOSIS — J441 Chronic obstructive pulmonary disease with (acute) exacerbation: Principal | ICD-10-CM | POA: Diagnosis present

## 2011-03-16 DIAGNOSIS — Z880 Allergy status to penicillin: Secondary | ICD-10-CM

## 2011-03-16 DIAGNOSIS — E119 Type 2 diabetes mellitus without complications: Secondary | ICD-10-CM | POA: Diagnosis present

## 2011-03-16 DIAGNOSIS — IMO0002 Reserved for concepts with insufficient information to code with codable children: Secondary | ICD-10-CM

## 2011-03-16 DIAGNOSIS — Z79899 Other long term (current) drug therapy: Secondary | ICD-10-CM

## 2011-03-16 DIAGNOSIS — Z7982 Long term (current) use of aspirin: Secondary | ICD-10-CM

## 2011-03-16 DIAGNOSIS — D509 Iron deficiency anemia, unspecified: Secondary | ICD-10-CM | POA: Diagnosis present

## 2011-03-16 DIAGNOSIS — Z833 Family history of diabetes mellitus: Secondary | ICD-10-CM

## 2011-03-16 DIAGNOSIS — Z87891 Personal history of nicotine dependence: Secondary | ICD-10-CM

## 2011-03-16 LAB — IRON AND TIBC
TIBC: 447 ug/dL (ref 250–470)
UIBC: 427 ug/dL — ABNORMAL HIGH (ref 125–400)

## 2011-03-16 LAB — COMPREHENSIVE METABOLIC PANEL
ALT: 16 U/L (ref 0–35)
AST: 18 U/L (ref 0–37)
Alkaline Phosphatase: 73 U/L (ref 39–117)
CO2: 38 mEq/L — ABNORMAL HIGH (ref 19–32)
GFR calc Af Amer: >60 mL/min (ref >60)
Glucose, Bld: 171 mg/dL — ABNORMAL HIGH (ref 70–99)
Potassium: 3.8 mEq/L (ref 3.5–5.1)
Sodium: 141 mEq/L (ref 135–145)
Total Protein: 7.1 g/dL (ref 6.0–8.3)

## 2011-03-16 LAB — DIFFERENTIAL
Basophils Absolute: 0 10*3/uL (ref 0.0–0.1)
Basophils Relative: 0 % (ref 0–1)
Eosinophils Relative: 8 % — ABNORMAL HIGH (ref 0–5)
Lymphocytes Relative: 29 % (ref 12–46)
Neutro Abs: 5.2 10*3/uL (ref 1.7–7.7)

## 2011-03-16 LAB — CK TOTAL AND CKMB (NOT AT ARMC)
CK, MB: 7.3 ng/mL (ref 0.3–4.0)
Relative Index: 2.8 — ABNORMAL HIGH (ref 0.0–2.5)
Total CK: 258 U/L — ABNORMAL HIGH (ref 7–177)

## 2011-03-16 LAB — HEMOGLOBIN A1C: Hgb A1c MFr Bld: 6.8 % — ABNORMAL HIGH (ref <5.7)

## 2011-03-16 LAB — VITAMIN B12: Vitamin B-12: 931 pg/mL — ABNORMAL HIGH (ref 211–911)

## 2011-03-16 LAB — CBC
HCT: 32.2 % — ABNORMAL LOW (ref 36.0–46.0)
RDW: 15.9 % — ABNORMAL HIGH (ref 11.5–15.5)
WBC: 9.4 10*3/uL (ref 4.0–10.5)

## 2011-03-16 LAB — FERRITIN: Ferritin: 9 ng/mL — ABNORMAL LOW (ref 10–291)

## 2011-03-17 LAB — CBC
HCT: 31.9 % — ABNORMAL LOW (ref 36.0–46.0)
Hemoglobin: 9.7 g/dL — ABNORMAL LOW (ref 12.0–15.0)
MCH: 26.4 pg (ref 26.0–34.0)
MCHC: 30.4 g/dL (ref 30.0–36.0)
MCV: 86.9 fL (ref 78.0–100.0)
RDW: 16 % — ABNORMAL HIGH (ref 11.5–15.5)

## 2011-03-17 LAB — DIFFERENTIAL
Eosinophils Relative: 0 % (ref 0–5)
Lymphocytes Relative: 9 % — ABNORMAL LOW (ref 12–46)
Lymphs Abs: 0.7 10*3/uL (ref 0.7–4.0)
Monocytes Absolute: 0.2 10*3/uL (ref 0.1–1.0)
Monocytes Relative: 2 % — ABNORMAL LOW (ref 3–12)

## 2011-03-17 LAB — GLUCOSE, CAPILLARY
Glucose-Capillary: 153 mg/dL — ABNORMAL HIGH (ref 70–99)
Glucose-Capillary: 121 mg/dL — ABNORMAL HIGH (ref 70–99)
Glucose-Capillary: 96 mg/dL (ref 70–99)

## 2011-03-28 NOTE — Discharge Summary (Signed)
[name], [name] NO.:  [id number]  MEDICAL RECORD NO.:  [id number]  LOCATION:  3739                         FACILITY:  [facility]  PHYSICIAN:  Gordy Savers, MDDATE OF BIRTH:  Oct 16, 1945  DATE OF ADMISSION:  03/16/2011 DATE OF DISCHARGE:  03/17/2011                              DISCHARGE SUMMARY   FINAL DIAGNOSES: 1. Acute exacerbation of chronic obstructive pulmonary disease. 2. Iron-deficiency anemia.  ADDITIONAL DIAGNOSES: 1. Hypertension. 2. Type 2 diabetes. 3. Obesity.  DISCHARGE MEDICATIONS: 1. Aspirin 81 mg daily. 2. Prednisone taper 40 mg daily for 2 days then 30 mg daily for 2 days     then 20 mg daily for 2 days then 10 mg daily for 2 days then     discontinue. 3. Albuterol home nebulizer treatments 2.5 q.i.d. 4. Cozaar 100 mg daily. 5. Diltiazem 180 mg daily. 6. Glucophage 500 mg b.i.d. 7. Mucinex 600 mg b.i.d. 8. Hydrochlorothiazide 25 mg daily. 9. Protonix 40 mg daily. 10.Spiriva 18 mcg daily. 11.Symbicort 160 two puffs b.i.d. 12.Ambien 5 mg at bedtime p.r.n. 13.Home O2 at 2 Ls per minute.  HISTORY OF PRESENT ILLNESS:  The patient is a 66 year old African American female who has a history of stage IV COPD.  She was stable until the day of admission when she became acutely short of breath and presented to the emergency room in respiratory distress.  In the ED setting, she was treated with aggressive inhalational bronchodilators and failed to improve with persistent shortness of breath and tachycardia.  She was subsequently admitted for further evaluation and treatment of her decompensated COPD.  LABORATORY DATA AND HOSPITAL COURSE:  The patient was admitted to a telemetry setting where she continued to receive aggressive inhalational bronchodilators.  She was also treated with parenteral therapy with Solu- Medrol.  She did not require antibiotic therapy.  Chest x-ray revealed cardiomegaly and mild basilar atelectatic changes  only.  On the second hospital day, she was much improved and was back to baseline.  Her oxygen saturations were in the mid 90s on 2 L oxygen per minute.  Prior to admission, she had been on chronic home oxygen therapy.  Her pulse rate had decreased from 120 to 90 to 100.  She felt that her pulmonary status was back to baseline.  Admission hemoglobin/hematocrit 9.9/32.2.  Because of the moderately severe anemia, an anemia workup was obtained.  This was consistent with iron-deficiency anemia with a ferritin level of 9 and iron level of 20 with a TIBC of 447 percent saturation of 4%.  She states that she did have colonoscopy approximately 2 years ago with a polypectomy.  She was told to follow up with her primary care provider in to take supplemental iron.  Other laboratory studies were fairly unremarkable.  TSH was slowly depressed, which has been noted in the past at 0.339.  Her blood pressure remained well-controlled.  DISPOSITION:  The patient was discharged today on the medical regimen as listed above.  She has been asked to follow up with her primary care provider within the next week.  She has also been asked to take iron supplements twice daily.  She will be discharged on her preadmission medications.  In addition, she  will be treated with a prednisone Dosepak as detailed above.  CONDITION ON DISCHARGE:  Stable.     Gordy Savers, MD     PFK/MEDQ  D:  03/17/2011  T:  03/17/2011  Job:  [id number]  Electronically Signed by Eleonore Chiquito MD on 03/28/2011 09:15:15 AM

## 2011-03-28 NOTE — H&P (Signed)
NAMEMarland [name]  [name], [name] NO.:  [id number]  MEDICAL RECORD NO.:  [id number]  LOCATION:  [facility]                         FACILITY:  [facility]  PHYSICIAN:  [name], MDDATE OF BIRTH:  Aug 29, 1945  DATE OF ADMISSION:  03/16/2011 DATE OF DISCHARGE:                             HISTORY & PHYSICAL   CHIEF COMPLAINT:  Shortness of breath.  HISTORY OF PRESENT ILLNESS:  The patient is a 65 year old African American female who has a history of stage IV COPD.  She was stable until the day of admission when she awoke slightly short of breath.  In spite of intensive inhalational bronchodilator treatment with both nebulizer treatments and meter dose inhaler, she became much more dyspneic.  Due to hyperventilation and inability to speak and worsening overall shortness of breath, she was brought to the ED via EMS.  In the emergency department, she was treated with additional bronchodilators, supplemental oxygen therapy with improvement.  She states until the day of admission her pulmonary status has been stable.  She has advanced disease on chronic home oxygen therapy.  She denies any symptoms of URI and has had no cough or sputum production.  ED evaluation included a CBC with a normal white count of 9.4.  Chest x- ray revealed cardiomegaly and basilar atelectatic changes.  The patient is now admitted for further evaluation and treatment of decompensated COPD.  PAST MEDICAL HISTORY:  The patient was discharged from this facility on January 23, 2011, following exacerbation of COPD.  At that time, the patient underwent a CT angio of the chest due to severe shortness of breath.  This suggested a moderate severe pericardial effusion.  She was evaluated by Cardiology and the pericardial effusion was felt to be insignificant.  That hospitalization was complicated by hypercapnic respiratory insufficiency.  Medical problems include treated hypertension, type 2 diabetes, obesity.   She also has a history of subclinical hyperthyroidism.  SURGICAL PROCEDURES:  Have included a prior hysterectomy and bilateral lumpectomies of the breasts, both decades ago.  SOCIAL HISTORY:  The patient is a former smoker and discontinued tobacco use 6 years ago.  She states that she consumes 2 or 3 alcoholic beverages per week.  Review of her hospital records revealed a history of daily alcohol use.  PRESENT MEDICAL REGIMEN:  Includes the following: 1. Aspirin 81 mg daily. 2. Home albuterol treatments. 3. Combivent metered-dose inhaler. 4. Cozaar 100 mg daily. 5. Diltiazem extended release 180 mg daily. 6. Glucophage 500 mg b.i.d. 7. Mucinex 600 mg b.i.d. 8. Hydrochlorothiazide 25 mg daily. 9. Protonix 40 mg daily. 10.Spiriva 18 mcg daily. 11.Symbicort 160 two puffs b.i.d. 12.Ambien 5 mg at bedtime p.r.n. sleep. 13.She is on home oxygen therapy. 14.Norvasc is also listed as a preadmission medication.  FAMILY HISTORY:  Fairly noncontributory.  Father who is a heavy smoker died of a possible CNS neoplasm.  Mother died at 52 with complications of COPD.  One sister has COPD and ongoing tobacco use.  One half-sister, cerebrovascular disease.  Three half-brothers, health status unknown. Family history is strongly positive for diabetes.  REVIEW OF SYSTEMS:  GENERAL:  No fever, chills, sweats, or change in weight.  HEENT:  Denies any nasal discharge, hoarseness, hearing or  visual loss.  SKIN:  No history of rash or lesions.  CARDIOPULMONARY: See history present illness.  The patient denies any active wheezing, cough, palpitations, orthopnea or syncope.  GI:  No nausea, vomiting, or diarrhea.  No melena.  Denies any abdominal pain or change in her bowel habits.  GENITOURINARY:  Denies urinary frequency or dysuria.  No hematuria.  ENDOCRINE:  Positive for diabetes.  Also, apparently has a history of a suppressed TSH.  PHYSICAL EXAMINATION:  GENERAL:  A well-developed mildly obese  black female who is comfortable at rest.  Nasal oxygen in place. VITAL SIGNS:  Temperature 98.3, blood pressure 150/60, pulse rate 120, respiratory rate 18-20, O2 saturation 95% on supplemental O2. SKIN:  Warm and dry without rash. HEENT:  Normal pupil responses.  Mild arcus senilis was noted.  ENT otherwise normal.  Oropharynx was clear. NECK:  No bruits or neck vein distention.  No adenopathy. CHEST:  No increased work of breathing.  Breath sounds were generally diminished especially at the bases.  There is no active wheezing. CARDIOVASCULAR:  A resting tachycardia at the rate of 120.  Heart sounds were distinct.  No murmur or gallop appreciated. ABDOMEN:  Obese, soft, and nontender.  No organomegaly. EXTREMITIES:  Trace pedal edema.  Pedal pulses were full.  Extremities were well perfused.  MUSCULOSKELETAL:  Unremarkable. NEUROLOGIC:  The patient alert and oriented with normal speech.  Cranial nerve examination intact.  Motor strength was normal.  LABORATORY STUDIES:  A chest x-ray revealed cardiomegaly and basilar atelectasis.  Random blood sugar 171, BUN 19, creatinine 0.87.  CBC: White count 9.4, hemoglobin 9.4, hematocrit 32.1, platelet count normal at 319,000, MCV 88.5.  Cardiac enzymes normal.  IMPRESSION: 1. Acute exacerbation of chronic obstructive pulmonary disease.  The     patient will be admitted to the hospital to telemetry setting.  She     will continue to receive intensive inhalational bronchodilators     therapy.  She will also be treated with parenteral steroids.  If     she is improved in the morning, we will transition to oral     steroids.  We will hold antibiotic therapy at this time. 2. Hypertension, stable.  We will continue preadmission medications. 3. Normochromic normocytic anemia.  We will initiate an anemia workup.     This will include ferritin, iron, TIBC and a B12 level.  Stool for     occult blood will be checked. 4. Type 2 diabetes.  A hemoglobin  A1c will be monitored.  The patient     will be placed on a sliding scale insulin order set.  We will     maintain on metformin. 5. Subclinical hyperthyroidism.  We will check a TSH.     Courtney Savers, MD     PFK/MEDQ  D:  03/16/2011  T:  03/16/2011  Job:  [id number]  Electronically Signed by Eleonore Chiquito MD on 03/28/2011 09:15:18 AM

## 2011-04-04 ENCOUNTER — Ambulatory Visit: Payer: Medicare Other | Admitting: Cardiovascular Disease

## 2011-04-10 ENCOUNTER — Encounter: Payer: Self-pay | Admitting: Cardiovascular Disease

## 2011-04-19 ENCOUNTER — Encounter: Payer: Self-pay | Admitting: Internal Medicine

## 2011-04-19 ENCOUNTER — Ambulatory Visit (INDEPENDENT_AMBULATORY_CARE_PROVIDER_SITE_OTHER): Payer: Medicare Other | Admitting: Internal Medicine

## 2011-04-19 VITALS — BP 130/92 | HR 98 | Temp 98.5°F | Ht 62.5 in | Wt 187.8 lb

## 2011-04-19 DIAGNOSIS — J449 Chronic obstructive pulmonary disease, unspecified: Secondary | ICD-10-CM

## 2011-04-19 NOTE — Progress Notes (Signed)
Subjective:    Patient ID: [name], female    DOB: February 25, 1946, 65 y.o.   MRN: [id number]  HPI 65 yo female with known hx of  1. Severe/ very severe  COPD -O2 depenedent (advair causes epistaxis)  2. Recurrent AECOPD -   - Oct 2011, JAn 2012, March 2012 and August 2012.  3. Small but increasing pericardial effusion on CT jan and march 2012. Moderate Rt atrial collapse in August 2012 - Dr Elease Hashimoto since Aug 2012 4. Isolated social situation.  5. Ex smoker  6.hx of snoring and excess daytime somnolence but not using cpap; no dx of OSA.    OV 02/27/11: Followup for above issues. Last seen by NP 1 months ago for med calendar folliwng her aecopd admit month ago. Currently states dyspnea is stable class 3. No change in cough and sputum. She wants a scooter for increased mobility. In talking to her she lives alone without friends or family. She is doing ADLs and groceries all by herself. States walking from parking lot to fron t entrance of [employer] makes her severely dyspneic and unable to proceed further. She wishes she had new lungs but realizes and does not want option of transplant. She desires improved quality of life and improved dyspnea and able to attend church which she stopped several years ago  REC We will do prescription for scooter or check with pmd if he/she can do  You could consider a home hospice program that could benefit your quality of life, shortness of breath, symptoms  Congrats on flu shot  Return in 8 weeks to report progress  Have spirometry on return  OV 04/19/11: Followup COPD. No new prblems. Living at home. Stable health. Good days and bad days. Gets dyspneic walking 10 feet. Not much cough. Some white very little sputum 1-2 days. Denies chst pain. Has not pursued issue of scooter with PMD. She wishes for scooter to improve mobility. Spirometry today: Fev1 0.85L/45%, Ratio   No change in past, family, social hx: admits mother and sister have copd  Review of  Systems  Constitutional: Negative for fever and unexpected weight change.  HENT: Negative for ear pain, nosebleeds, congestion, sore throat, rhinorrhea, sneezing, trouble swallowing, dental problem, postnasal drip and sinus pressure.   Eyes: Negative for redness and itching.  Respiratory: Negative for cough, chest tightness, shortness of breath and wheezing.   Cardiovascular: Negative for palpitations and leg swelling.  Gastrointestinal: Negative for nausea and vomiting.  Genitourinary: Negative for dysuria.  Musculoskeletal: Negative for joint swelling.  Skin: Negative for rash.  Neurological: Negative for headaches.  Hematological: Does not bruise/bleed easily.  Psychiatric/Behavioral: Negative for dysphoric mood. The patient is not nervous/anxious.        Objective:   Physical Exam GEN: A/Ox3; pleasant , NAD, chronically ill appearing.   HEENT:  [place]/AT,  EACs-clear, TMs-wnl, NOSE-clear, THROAT-clear, no lesions, no postnasal drip or exudate noted.   NECK:  Supple w/ fair ROM; no JVD; normal carotid impulses w/o bruits; no thyromegaly or nodules palpated; no lymphadenopathy.  RESP  Coarse BS w/ no wheezing . no accessory muscle use, no dullness to percussion  CARD:  RRR, no m/r/g  , 1+ peripheral edema, pulses intact, no cyanosis or clubbing.  GI:   Soft & nt; nml bowel sounds; no organomegaly or masses detected.  Musco: Warm bil, no deformities or joint swelling noted.   Neuro: alert, no focal deficits noted.    Skin: Warm, no lesions or rashes  Assessment & Plan:

## 2011-04-19 NOTE — Patient Instructions (Addendum)
Your copd is severe stage 3 but not very severe stage 4 as I originally thought I think you are deconditioned and this is contributing to your symptoms So, I do not think scooter is the answer at this point I would like for you to attend pulmonary rehab at [facility]; nurse will do referral Check blood for alpha 1 genetic test for copd Return in 2 and 1/2 month for copd followup Continue your medications as before Note: if your current sputum or cough or shortness of breath gets worse, call us immediately

## 2011-04-19 NOTE — Assessment & Plan Note (Addendum)
Stable disease  PLAN Your copd is severe stage 3 but not very severe stage 4 as I originally thought I think you are deconditioned and this is contributing to your symptoms So, I do not think scooter or hospice is the answer at this point I would like for you to attend pulmonary rehab at [facility]; nurse will do referral Check blood for alpha 1 genetic test for copd Return in 2 and 1/2 month for copd followup Continue your medications as before Note: if your current sputum or cough or shortness of breath gets worse, call us immediately  15 min visit. > 50% of time in counseling face to face

## 2011-04-22 ENCOUNTER — Other Ambulatory Visit (HOSPITAL_COMMUNITY): Payer: Self-pay | Admitting: *Deleted

## 2011-04-25 ENCOUNTER — Ambulatory Visit
Admission: RE | Admit: 2011-04-25 | Discharge: 2011-04-25 | Disposition: A | Discharge status: Home / Self Care | Payer: Medicare Other | Source: Ambulatory Visit | Attending: Internal Medicine | Admitting: Internal Medicine

## 2011-04-25 DIAGNOSIS — Z780 Asymptomatic menopausal state: Secondary | ICD-10-CM

## 2011-04-25 DIAGNOSIS — Z1231 Encounter for screening mammogram for malignant neoplasm of breast: Secondary | ICD-10-CM

## 2011-05-13 ENCOUNTER — Ambulatory Visit: Payer: Medicare Other | Admitting: Internal Medicine

## 2011-05-30 ENCOUNTER — Encounter: Payer: Self-pay | Admitting: Internal Medicine

## 2011-06-05 ENCOUNTER — Encounter (HOSPITAL_COMMUNITY): Payer: Self-pay | Admitting: *Deleted

## 2011-06-21 ENCOUNTER — Ambulatory Visit: Payer: Medicare Other | Admitting: Internal Medicine

## 2011-06-23 ENCOUNTER — Emergency Department (HOSPITAL_COMMUNITY)
Admission: EM | Admit: 2011-06-23 | Discharge: 2011-06-23 | Disposition: A | Discharge status: Home / Self Care | Payer: Medicare Other | Attending: Emergency Medicine | Admitting: Emergency Medicine

## 2011-06-23 ENCOUNTER — Encounter (HOSPITAL_COMMUNITY): Payer: Self-pay | Admitting: Emergency Medicine

## 2011-06-23 DIAGNOSIS — Z7982 Long term (current) use of aspirin: Secondary | ICD-10-CM | POA: Insufficient documentation

## 2011-06-23 DIAGNOSIS — J4489 Other specified chronic obstructive pulmonary disease: Secondary | ICD-10-CM | POA: Insufficient documentation

## 2011-06-23 DIAGNOSIS — M79609 Pain in unspecified limb: Secondary | ICD-10-CM | POA: Insufficient documentation

## 2011-06-23 DIAGNOSIS — J449 Chronic obstructive pulmonary disease, unspecified: Secondary | ICD-10-CM | POA: Insufficient documentation

## 2011-06-23 DIAGNOSIS — M109 Gout, unspecified: Secondary | ICD-10-CM | POA: Insufficient documentation

## 2011-06-23 DIAGNOSIS — I10 Essential (primary) hypertension: Secondary | ICD-10-CM | POA: Insufficient documentation

## 2011-06-23 DIAGNOSIS — E119 Type 2 diabetes mellitus without complications: Secondary | ICD-10-CM | POA: Insufficient documentation

## 2011-06-23 DIAGNOSIS — Z79899 Other long term (current) drug therapy: Secondary | ICD-10-CM | POA: Insufficient documentation

## 2011-06-23 MED ORDER — OXYCODONE HCL 5 MG PO TABS: 5.0000 mg | ORAL_TABLET | ORAL | Status: AC | PRN

## 2011-06-23 MED ORDER — INDOMETHACIN 25 MG PO CAPS: 25.0000 mg | ORAL_CAPSULE | Freq: Three times a day (TID) | ORAL | Status: DC | PRN

## 2011-06-23 MED ORDER — PREDNISONE 20 MG PO TABS
40.0000 mg | ORAL_TABLET | Freq: Once | ORAL | Status: AC
  Administered 2011-06-23: 40 mg via ORAL
  Filled 2011-06-23: qty 2

## 2011-06-23 MED ORDER — PREDNISONE 10 MG PO TABS: 40.0000 mg | ORAL_TABLET | Freq: Every day | ORAL | Status: DC

## 2011-06-23 MED ORDER — INDOMETHACIN 25 MG PO CAPS
50.0000 mg | ORAL_CAPSULE | Freq: Once | ORAL | Status: AC
  Administered 2011-06-23: 50 mg via ORAL
  Filled 2011-06-23: qty 2

## 2011-06-23 NOTE — ED Provider Notes (Signed)
History     CSN: [id number]  Arrival date & time 06/23/11  0548   First MD Initiated Contact with Patient 06/23/11 [phone]      Chief Complaint  Patient presents with  . Gout    (Consider location/radiation/quality/duration/timing/severity/associated sxs/prior treatment) HPI  Pt has a past medical history of gout, COPD, diabetes and hypertension. She presents to the ED with complaints of a gout attack in her left great toe. It has been many years since having an attack and her Indomethacin expired in 2010, she's states. The pain started last Thursday, however it began to hurt very badly this morning. She denies fevers, chills, nausea, vomiting or recent injury to the toe. The pain does not radiate past the left great toe and the quality of the pain is sharp, throbbing and severe.  Past Medical History  Diagnosis Date  . Asthma   . COPD (chronic obstructive pulmonary disease)   . Hypertension   . Diabetes mellitus 09/13/2010    glucophage  . Gout     Past Surgical History  Procedure Date  . Breast biopsy     both breasts  . Total abdominal hysterectomy     Family History  Problem Relation Age of Onset  . COPD Mother   . COPD Sister   . Heart disease Mother   . Cancer Father   . Diabetes Other     3 maternal aunts  . Prostate cancer Paternal Uncle     History  Substance Use Topics  . Smoking status: Former Smoker -- 1.0 packs/day for 30 years    Types: Cigarettes    Quit date: 06/17/2004  . Smokeless tobacco: Not on file  . Alcohol Use: 1.0 oz/week    2 drink(s) per week    OB History    Grav Para Term Preterm Abortions TAB SAB Ect Mult Living                  Review of Systems  All other systems reviewed and are negative.    Allergies  Lisinopril and Penicillins  Home Medications   Current Outpatient Rx  Name Route Sig Dispense Refill  . ALBUTEROL SULFATE (2.5 MG/3ML) 0.083% IN NEBU Nebulization Take 2.5 mg by nebulization every 6 (six) hours as  needed.      [name] 18-103 MCG/ACT IN AERO Inhalation Inhale 2 puffs into the lungs every 6 (six) hours as needed.      . ASPIRIN 81 MG PO TABS Oral Take 81 mg by mouth daily.      . BUDESONIDE-FORMOTEROL FUMARATE 80-4.5 MCG/ACT IN AERO Inhalation Inhale 2 puffs into the lungs 2 (two) times daily.      . IRON 325 (65 FE) MG PO TABS Oral Take 1 tablet by mouth daily.      [name] HYDROCHLOROTHIAZIDE 25 MG PO TABS Oral Take 25 mg by mouth daily.      . INDOMETHACIN 25 MG PO CAPS Oral Take 1 capsule (25 mg total) by mouth 3 (three) times daily as needed. 30 capsule 0  . LOSARTAN POTASSIUM 100 MG PO TABS Oral Take 100 mg by mouth daily.      [name] LOVASTATIN 40 MG PO TABS Oral Take 40 mg by mouth at bedtime.      [name] METFORMIN HCL ER (MOD) 500 MG PO TB24 Oral Take 500 mg by mouth 2 (two) times daily with a meal.      . PANTOPRAZOLE SODIUM 40 MG PO TBEC Oral Take 40  mg by mouth daily.      [name] PREDNISONE 10 MG PO TABS Oral Take 4 tablets (40 mg total) by mouth daily. 15 tablet 0  . TIOTROPIUM BROMIDE MONOHYDRATE 18 MCG IN CAPS Inhalation Place 18 mcg into inhaler and inhale daily.        BP 157/115  Pulse 113  Temp(Src) 98.4 F (36.9 C) (Oral)  Resp 20  SpO2 99%  Physical Exam  Nursing note and vitals reviewed. Constitutional: She is oriented to person, place, and time. She appears well-developed and well-nourished.  HENT:  Head: Normocephalic and atraumatic.  Eyes: Conjunctivae are normal. Pupils are equal, round, and reactive to light.  Neck: Trachea normal, normal range of motion and full passive range of motion without pain. Neck supple.  Cardiovascular: Normal rate, regular rhythm and normal pulses.   Pulmonary/Chest: Effort normal and breath sounds normal. Chest wall is not dull to percussion. She exhibits no tenderness, no crepitus, no edema, no deformity and no retraction.  Abdominal: Normal appearance.  Musculoskeletal: Normal range of motion.       Feet:  Lymphadenopathy:         Head (right side): No submental, no submandibular, no tonsillar, no preauricular, no posterior auricular and no occipital adenopathy present.       Head (left side): No submental, no submandibular, no tonsillar, no preauricular, no posterior auricular and no occipital adenopathy present.    She has no cervical adenopathy.    She has no axillary adenopathy.  Neurological: She is oriented to person, place, and time. She has normal strength.  Skin: Skin is warm, dry and intact.  Psychiatric: She has a normal mood and affect. Her speech is normal and behavior is normal. Judgment and thought content normal. Cognition and memory are normal.    ED Course  Procedures (including critical care time)  Labs Reviewed - No data to display No results found.   1. Gout attack       MDM  Pt is a diabetic and I went over the necessity of thoroughly checking her blood sugar multiple times a day and if her sugars elevate to discontinue taking the prednisone. Pt has been instructed to follow-up with her primary care provider.  Dorthula Matas, PA 06/23/11 [phone]

## 2011-06-23 NOTE — ED Notes (Signed)
PT. REPORTS PROGRESSING RIGHT BIG TOE PAIN / SWELLING - GOUT FLARE UP ONSET LAST Thursday WORSE THIS MORNING.

## 2011-06-23 NOTE — ED Notes (Signed)
Pt ambulated with limp from gout; VSS; no signs of distress; A&Ox3; respirations even and unlabored; skin warm and dry.

## 2011-06-23 NOTE — ED Provider Notes (Signed)
PT relates hx of gout, her last episode was about a year ago. She relates she started getting pain in her right great toe 3 days ago and was controlling her pain with tylenol, but maxed out on tylenol during the day and didn't have anything to take last night.   Pt has moderate swelling with redness over the MTP of her right great toe and some diffuse swelling of her distal foot.  Medical screening examination/treatment/procedure(s) were conducted as a shared visit with non-physician practitioner(s) and myself.  I personally evaluated the patient during the encounter Devoria Albe, MD, Franz Dell, MD 06/23/11 0700

## 2011-07-31 ENCOUNTER — Ambulatory Visit: Payer: Medicare Other | Admitting: Internal Medicine

## 2011-08-01 ENCOUNTER — Encounter: Payer: Self-pay | Admitting: Internal Medicine

## 2011-08-01 ENCOUNTER — Ambulatory Visit (INDEPENDENT_AMBULATORY_CARE_PROVIDER_SITE_OTHER): Payer: Medicare Other | Admitting: Internal Medicine

## 2011-08-01 VITALS — BP 142/90 | HR 99 | Temp 98.4°F | Ht 62.5 in | Wt 188.8 lb

## 2011-08-01 DIAGNOSIS — J449 Chronic obstructive pulmonary disease, unspecified: Secondary | ICD-10-CM

## 2011-08-01 NOTE — Progress Notes (Signed)
Subjective:    Patient ID: [name], female    DOB: 10-18-1945, 66 y.o.   MRN: [id number]  HPI 66 yo female with known hx of  1. Severe/ very severe  COPD -O2 depenedent (advair causes epistaxis)  2. Recurrent AECOPD -   - Oct 2011, JAn 2012, March 2012 and August 2012.  3. Small but increasing pericardial effusion on CT jan and march 2012. Moderate Rt atrial collapse in August 2012 - Dr Elease Hashimoto since Aug 2012 4. Isolated social situation.  5. Ex smoker  6.hx of snoring and excess daytime somnolence but not using cpap; no dx of OSA.  7. Body mass index is 33.98 kg/(m^2). - on 08/01/2011     OV 02/27/11: Followup for above issues. Last seen by NP 1 months ago for med calendar folliwng her aecopd admit month ago. Currently states dyspnea is stable class 3. No change in cough and sputum. She wants a scooter for increased mobility. In talking to her she lives alone without friends or family. She is doing ADLs and groceries all by herself. States walking from parking lot to fron t entrance of [employer] makes her severely dyspneic and unable to proceed further. She wishes she had new lungs but realizes and does not want option of transplant. She desires improved quality of life and improved dyspnea and able to attend church which she stopped several years ago  REC We will do prescription for scooter or check with pmd if he/she can do  You could consider a home hospice program that could benefit your quality of life, shortness of breath, symptoms  Congrats on flu shot  Return in 8 weeks to report progress  Have spirometry on return  OV 04/19/11: Followup COPD. No new prblems. Living at home. Stable health. Good days and bad days. Gets dyspneic walking 10 feet. Not much cough. Some white very little sputum 1-2 days. Denies chst pain. Has not pursued issue of scooter with PMD. She wishes for scooter to improve mobility. Spirometry today: Fev1 0.85L/45%, Ratio   No change in past, family,  social hx: admits mother and sister have copd  Your copd is severe stage 3 but not very severe stage 4 as I originally thought  I think you are deconditioned and this is contributing to your symptoms  So, I do not think scooter is the answer at this point  I would like for you to attend pulmonary rehab at [facility]; nurse will do referral  Check blood for alpha 1 genetic test for copd  Return in 2 and 1/2 month for copd followup  Continue your medications as before  Note: if your current sputum or cough or shortness of breath gets worse, call us immediately       OV 08/01/2011 Followup COPD. Still living alone. Reports compliance with o2 and medications. Still not attended rehab; says never got called. Diagnosed iwht panic disoreder in interim and on zyprexa and she reports significant improvement in dyspnea, and mood since then. OVerall well. No specific complaints. No weight loss. No AECOPD symptoms. Has had flu shot  Past, Family, Social reviewed: no change since last visit other than diagnosis of panic  Current meds are  Current outpatient prescriptions:albuterol (PROVENTIL) (2.5 MG/3ML) 0.083% nebulizer solution, Take 2.5 mg by nebulization every 6 (six) hours as needed.  , Disp: , Rfl: ;  albuterol-ipratropium (COMBIVENT) 18-103 MCG/ACT inhaler, Inhale 2 puffs into the lungs every 6 (six) hours as needed.  , Disp: , Rfl: ;  ALPRAZolam (XANAX) 0.25 MG tablet, Take 0.25 mg by mouth as needed., Disp: , Rfl:  aspirin 81 MG tablet, Take 81 mg by mouth daily.  , Disp: , Rfl: ;  Ferrous Sulfate (IRON) 325 (65 FE) MG TABS, Take 1 tablet by mouth daily.  , Disp: , Rfl: ;  hydrochlorothiazide 25 MG tablet, Take 25 mg by mouth daily.  , Disp: , Rfl: ;  indomethacin (INDOCIN) 25 MG capsule, Take 1 capsule (25 mg total) by mouth 3 (three) times daily as needed., Disp: 30 capsule, Rfl: 0 losartan (COZAAR) 100 MG tablet, Take 100 mg by mouth daily.  , Disp: , Rfl: ;  lovastatin (MEVACOR) 40 MG  tablet, Take 40 mg by mouth at bedtime.  , Disp: , Rfl: ;  metFORMIN (GLUMETZA) 500 MG (MOD) 24 hr tablet, Take 500 mg by mouth 2 (two) times daily with a meal.  , Disp: , Rfl: ;  Multiple Vitamins-Minerals (MULTIPLE VITAMINS/WOMENS PO), Take 1 tablet by mouth daily., Disp: , Rfl:  budesonide-formoterol (SYMBICORT) 80-4.5 MCG/ACT inhaler, Inhale 2 puffs into the lungs 2 (two) times daily.  , Disp: , Rfl: ;  pantoprazole (PROTONIX) 40 MG tablet, Take 40 mg by mouth daily.  , Disp: , Rfl: ;  tiotropium (SPIRIVA) 18 MCG inhalation capsule, Place 18 mcg into inhaler and inhale daily. , Disp: , Rfl:     Review of Systems  Constitutional: Negative for fever and unexpected weight change.  HENT: Negative for ear pain, nosebleeds, congestion, sore throat, rhinorrhea, sneezing, trouble swallowing, dental problem, postnasal drip and sinus pressure.   Eyes: Negative for redness and itching.  Respiratory: Negative for cough, chest tightness, shortness of breath and wheezing.   Cardiovascular: Negative for palpitations and leg swelling.  Gastrointestinal: Negative for nausea and vomiting.  Genitourinary: Negative for dysuria.  Musculoskeletal: Negative for joint swelling.  Skin: Negative for rash.  Neurological: Negative for headaches.  Hematological: Does not bruise/bleed easily.  Psychiatric/Behavioral: Negative for dysphoric mood. The patient is not nervous/anxious.        Objective:   Physical Exam GEN: A/Ox3; pleasant , NAD, chronically ill appearing.   HEENT:  /AT,  EACs-clear, TMs-wnl, NOSE-clear, THROAT-clear, no lesions, no postnasal drip or exudate noted.   NECK:  Supple w/ fair ROM; no JVD; normal carotid impulses w/o bruits; no thyromegaly or nodules palpated; no lymphadenopathy.  RESP  Coarse BS w/ no wheezing . no accessory muscle use, no dullness to percussion  CARD:  RRR, no m/r/g  , 1+ peripheral edema, pulses intact, no cyanosis or clubbing.  GI:   Soft & nt; nml bowel sounds;  no organomegaly or masses detected.  Musco: Warm bil, no deformities or joint swelling noted.   Neuro: alert, no focal deficits noted.    Skin: Warm, no lesions or rashes        Assessment & Plan:

## 2011-08-01 NOTE — Patient Instructions (Signed)
Your severe copd is currently stable Continue spiriva, symbicort and oxygen Nurse will re-refer you to pulmonary rehab - do not know why they did not call Glad you had flu shot Will check alpha 1 today - genetic test for copd REturn in 3 months Call anytime if you feel symptoms are getting worse or any change At followup do spirometry  

## 2011-08-05 NOTE — Assessment & Plan Note (Signed)
Your severe copd is currently stable Continue spiriva, symbicort and oxygen Nurse will re-refer you to pulmonary rehab - do not know why they did not call Glad you had flu shot Will check alpha 1 today - genetic test for copd REturn in 3 months Call anytime if you feel symptoms are getting worse or any change At followup do spirometry

## 2011-08-08 ENCOUNTER — Encounter (HOSPITAL_COMMUNITY): Payer: Self-pay | Admitting: *Deleted

## 2011-08-08 ENCOUNTER — Emergency Department (HOSPITAL_COMMUNITY): Payer: Medicare Other

## 2011-08-08 ENCOUNTER — Emergency Department (HOSPITAL_COMMUNITY)
Admission: EM | Admit: 2011-08-08 | Discharge: 2011-08-08 | Disposition: A | Discharge status: Home / Self Care | Payer: Medicare Other | Attending: Emergency Medicine | Admitting: Emergency Medicine

## 2011-08-08 ENCOUNTER — Other Ambulatory Visit: Payer: Self-pay

## 2011-08-08 DIAGNOSIS — J449 Chronic obstructive pulmonary disease, unspecified: Secondary | ICD-10-CM | POA: Insufficient documentation

## 2011-08-08 DIAGNOSIS — F17200 Nicotine dependence, unspecified, uncomplicated: Secondary | ICD-10-CM | POA: Insufficient documentation

## 2011-08-08 DIAGNOSIS — Z90710 Acquired absence of both cervix and uterus: Secondary | ICD-10-CM | POA: Insufficient documentation

## 2011-08-08 DIAGNOSIS — I10 Essential (primary) hypertension: Secondary | ICD-10-CM | POA: Insufficient documentation

## 2011-08-08 DIAGNOSIS — E119 Type 2 diabetes mellitus without complications: Secondary | ICD-10-CM | POA: Insufficient documentation

## 2011-08-08 DIAGNOSIS — K7689 Other specified diseases of liver: Secondary | ICD-10-CM | POA: Insufficient documentation

## 2011-08-08 DIAGNOSIS — Z79899 Other long term (current) drug therapy: Secondary | ICD-10-CM | POA: Insufficient documentation

## 2011-08-08 DIAGNOSIS — R109 Unspecified abdominal pain: Secondary | ICD-10-CM | POA: Insufficient documentation

## 2011-08-08 DIAGNOSIS — K625 Hemorrhage of anus and rectum: Secondary | ICD-10-CM | POA: Insufficient documentation

## 2011-08-08 DIAGNOSIS — J4489 Other specified chronic obstructive pulmonary disease: Secondary | ICD-10-CM | POA: Insufficient documentation

## 2011-08-08 DIAGNOSIS — K644 Residual hemorrhoidal skin tags: Secondary | ICD-10-CM | POA: Insufficient documentation

## 2011-08-08 HISTORY — DX: Dependence on supplemental oxygen: Z99.81

## 2011-08-08 LAB — COMPREHENSIVE METABOLIC PANEL
ALT: 17 U/L (ref 0–35)
AST: 17 U/L (ref 0–37)
Albumin: 3.8 g/dL (ref 3.5–5.2)
CO2: 36 mEq/L — ABNORMAL HIGH (ref 19–32)
Chloride: 102 mEq/L (ref 96–112)
GFR calc non Af Amer: 55 mL/min — ABNORMAL LOW (ref >90)
Potassium: 4.1 mEq/L (ref 3.5–5.1)
Sodium: 144 mEq/L (ref 135–145)
Total Bilirubin: 0.1 mg/dL — ABNORMAL LOW (ref 0.3–1.2)

## 2011-08-08 LAB — PROTIME-INR: INR: 0.94 (ref 0.00–1.49)

## 2011-08-08 LAB — CBC
HCT: 32.8 % — ABNORMAL LOW (ref 36.0–46.0)
MCHC: 30.8 g/dL (ref 30.0–36.0)
Platelets: 288 10*3/uL (ref 150–400)
RDW: 15.8 % — ABNORMAL HIGH (ref 11.5–15.5)
WBC: 7.5 10*3/uL (ref 4.0–10.5)

## 2011-08-08 LAB — DIFFERENTIAL
Basophils Absolute: 0 10*3/uL (ref 0.0–0.1)
Basophils Relative: 0 % (ref 0–1)
Lymphocytes Relative: 21 % (ref 12–46)
Monocytes Absolute: 0.5 10*3/uL (ref 0.1–1.0)
Neutro Abs: 5.4 10*3/uL (ref 1.7–7.7)
Neutrophils Relative %: 72 % (ref 43–77)

## 2011-08-08 LAB — URINALYSIS, ROUTINE W REFLEX MICROSCOPIC
Bilirubin Urine: NEGATIVE
Glucose, UA: NEGATIVE mg/dL
Ketones, ur: NEGATIVE mg/dL
Leukocytes, UA: NEGATIVE
Nitrite: NEGATIVE
Specific Gravity, Urine: 1.01 (ref 1.005–1.030)
pH: 7 (ref 5.0–8.0)

## 2011-08-08 LAB — URINE MICROSCOPIC-ADD ON

## 2011-08-08 LAB — TROPONIN I: Troponin I: <0.3 ng/mL (ref <0.30)

## 2011-08-08 MED ORDER — LORAZEPAM 2 MG/ML IJ SOLN
0.5000 mg | Freq: Once | INTRAMUSCULAR | Status: AC
  Administered 2011-08-08: 0.5 mg via INTRAVENOUS
  Filled 2011-08-08: qty 1

## 2011-08-08 MED ORDER — IOHEXOL 300 MG/ML  SOLN
100.0000 mL | Freq: Once | INTRAMUSCULAR | Status: AC | PRN
  Administered 2011-08-08: 100 mL via INTRAVENOUS

## 2011-08-08 MED ORDER — SODIUM CHLORIDE 0.9 % IV BOLUS (SEPSIS)
500.0000 mL | Freq: Once | INTRAVENOUS | Status: AC
  Administered 2011-08-08: 500 mL via INTRAVENOUS

## 2011-08-08 MED ORDER — IOHEXOL 300 MG/ML  SOLN: 20.0000 mL | INTRAMUSCULAR | Status: AC

## 2011-08-08 NOTE — ED Provider Notes (Signed)
History     CSN: [id number]  Arrival date & time 08/08/11  1624   First MD Initiated Contact with Patient 08/08/11 1701      Chief Complaint  Patient presents with  . Rectal Bleeding    (Consider location/radiation/quality/duration/timing/severity/associated sxs/prior treatment) HPI Comments: Patient presents from home with 2 episodes of blood mixed with her stool today. She describes dark blood mixed with brown stool. She denies abdominal pain, nausea, vomiting, fever, chest pain, shortness of breath, dizziness or lightheadedness. She reports having a colonoscopy about 2 years ago that showed polyps. Taking indomethacin for her gout. She is also on aspirin but not Coumadin. She's had dark red blood with wiping but it did not fill the toilet bowl. she saw dark blood mixed with brown stool. She complained of some burning around her anus.  The history is provided by the patient.    Past Medical History  Diagnosis Date  . Asthma   . COPD (chronic obstructive pulmonary disease)   . Hypertension   . Diabetes mellitus 09/13/2010    glucophage  . Gout   . Oxygen dependent     pt uses O2     Past Surgical History  Procedure Date  . Breast biopsy     both breasts  . Total abdominal hysterectomy     Family History  Problem Relation Age of Onset  . COPD Mother   . COPD Sister   . Heart disease Mother   . Cancer Father   . Diabetes Other     3 maternal aunts  . Prostate cancer Paternal Uncle     History  Substance Use Topics  . Smoking status: Former Smoker -- 1.0 packs/day for 30 years    Types: Cigarettes    Quit date: 06/17/2004  . Smokeless tobacco: Not on file  . Alcohol Use: 1.0 oz/week    2 drink(s) per week    OB History    Grav Para Term Preterm Abortions TAB SAB Ect Mult Living                  Review of Systems  Constitutional: Negative for fever, activity change, appetite change and fatigue.  HENT: Negative for congestion and rhinorrhea.   Eyes:  Negative for visual disturbance.  Respiratory: Negative for chest tightness and shortness of breath.   Gastrointestinal: Positive for blood in stool and anal bleeding. Negative for nausea, vomiting, abdominal pain and rectal pain.  Genitourinary: Negative for dysuria.  Musculoskeletal: Negative for back pain.  Skin: Negative for rash.  Neurological: Negative for dizziness, light-headedness and headaches.    Allergies  Lisinopril; Advair diskus; and Penicillins  Home Medications   Current Outpatient Rx  Name Route Sig Dispense Refill  . ALBUTEROL SULFATE (2.5 MG/3ML) 0.083% IN NEBU Nebulization Take 2.5 mg by nebulization every 6 (six) hours as needed. For shortness of breath.    [name] 18-103 MCG/ACT IN AERO Inhalation Inhale 2 puffs into the lungs every 6 (six) hours as needed. For shortness of breath.    . ALPRAZOLAM 0.25 MG PO TABS Oral Take 0.25 mg by mouth as needed. For panic attacks.    . ASPIRIN EC 81 MG PO TBEC Oral Take 81 mg by mouth daily.    . BUDESONIDE-FORMOTEROL FUMARATE 80-4.5 MCG/ACT IN AERO Inhalation Inhale 2 puffs into the lungs 2 (two) times daily.      . IRON 325 (65 FE) MG PO TABS Oral Take 1 tablet by mouth daily.      [name]  HYDROCHLOROTHIAZIDE 25 MG PO TABS Oral Take 25 mg by mouth daily.      . INDOMETHACIN 25 MG PO CAPS Oral Take 25 mg by mouth daily as needed. For pain/gout.    [name] LOSARTAN POTASSIUM 100 MG PO TABS Oral Take 100 mg by mouth daily.      [name] LOVASTATIN 40 MG PO TABS Oral Take 40 mg by mouth at bedtime.      [name] METFORMIN HCL 500 MG PO TABS Oral Take 500 mg by mouth 2 (two) times daily with a meal.    . MULTIPLE VITAMINS/WOMENS PO Oral Take 1 tablet by mouth daily.    [name] TIOTROPIUM BROMIDE MONOHYDRATE 18 MCG IN CAPS Inhalation Place 18 mcg into inhaler and inhale daily.       BP 148/94  Pulse 105  Temp(Src) 98.4 F (36.9 C) (Oral)  Resp 24  SpO2 97%  Physical Exam  Constitutional: She is oriented to person, place, and time. She  appears well-developed. No distress.  HENT:  Head: Normocephalic and atraumatic.  Mouth/Throat: Oropharynx is clear and moist. No oropharyngeal exudate.  Eyes: Conjunctivae and EOM are normal. Pupils are equal, round, and reactive to light.  Neck: Neck supple.  Cardiovascular: Normal rate, regular rhythm and normal heart sounds.   Pulmonary/Chest: Effort normal and breath sounds normal. No respiratory distress.  Abdominal: Soft. There is no tenderness. There is no rebound and no guarding.  Genitourinary: Guaiac positive stool.       Small nonthrombosed external hemorrhoid, no fissures, brown stool  Musculoskeletal: Normal range of motion. She exhibits no edema and no tenderness.  Neurological: She is alert and oriented to person, place, and time. No cranial nerve deficit.  Skin: Skin is warm.    ED Course  Procedures (including critical care time)  Labs Reviewed  CBC - Abnormal; Notable for the following:    RBC 3.76 (*)    Hemoglobin 10.1 (*)    HCT 32.8 (*)    RDW 15.8 (*)    All other components within normal limits  COMPREHENSIVE METABOLIC PANEL - Abnormal; Notable for the following:    CO2 36 (*)    Glucose, Bld 119 (*)    BUN 26 (*)    Total Bilirubin 0.1 (*)    GFR calc non Af Amer 55 (*)    GFR calc Af Amer 63 (*)    All other components within normal limits  URINALYSIS, ROUTINE W REFLEX MICROSCOPIC - Abnormal; Notable for the following:    Hgb urine dipstick MODERATE (*)    Protein, ur 100 (*)    All other components within normal limits  DIFFERENTIAL  PROTIME-INR  OCCULT BLOOD, POC DEVICE  URINE MICROSCOPIC-ADD ON  TROPONIN I  OCCULT BLOOD X 1 CARD TO LAB, STOOL   Ct Abdomen Pelvis W Contrast  08/08/2011  *RADIOLOGY REPORT*  Clinical Data: Lower abdominal pain.  Blood in the stool. Shortness of breath.  CT ABDOMEN AND PELVIS WITH CONTRAST  Technique:  Multidetector CT imaging of the abdomen and pelvis was performed following the standard protocol during bolus  administration of intravenous contrast.  Contrast: OMNIPAQUE IOHEXOL 300 MG/ML IV SOLN  Comparison: Chest CTA dated 01/17/2011.  Findings: No significant change in a moderately large pericardial effusion, measuring 3.6 cm in maximum thickness.  Adjacent mass- like consolidation of the right middle lobe appears smaller and more smoothly marginated.  Multiple small liver cysts.  Small ventral hernia above the level of the umbilicus, at the level  of the inferior edge of the lateral segment of the left lobe of the liver, in the midline.  This contains fluid and a small amount of fat.  This is at the superior aspect of a possible small midline surgical scar just above the level of the umbilicus.  8 mm oval area of low density in the lateral aspect of the spleen. This is only faintly visualized and not definitely seen previously. Unremarkable pancreas, gallbladder, adrenal glands, kidneys and urinary bladder.  Surgically absent uterus.  Normal left ovary. The right ovary is not identified.  No gastrointestinal abnormalities or enlarged lymph nodes.  The appendix is not definitely identified.  No evidence of appendicitis.  Lumbar spine degenerative changes.  IMPRESSION:  1.  Stable moderately large pericardial effusion. 2.  Decreased size of a mass-like area of consolidation in the medial right middle lobe, with smooth margins. 3.  Small liver cysts and probable small splenic cyst or hemangioma. 4.  Small ventral hernia containing fluid and a small amount of fat.  Original Report Authenticated By: Darrol Angel, M.D.     1. Rectal bleeding       MDM  Rectal bleeding without pain. History of aspirin and NSAID use. Small external hemorrhoid on exam. Vital signs stable, abdomen soft and benign. Hemoglobin stable. Mild tachycardia noted and has been similar in previous evaluations.  Patient aware of pericardial effusion and follows with Dr. Eden Emms regarding this.  It is stable in size from previous  studies.  She'll followup with gastroenterology regarding her rectal bleeding. Her hemoglobin is stable her vital signs are stable    Date: 08/08/2011  Rate: 105  Rhythm: sinus tachycardia  QRS Axis: normal  Intervals: normal  ST/T Wave abnormalities: normal  Conduction Disutrbances:none  Narrative Interpretation:   Old EKG Reviewed: unchanged      Glynn Octave, MD 08/09/11 [phone]

## 2011-08-08 NOTE — ED Notes (Signed)
PT called EMS because of bloody stool today.

## 2011-08-08 NOTE — Discharge Instructions (Signed)
Followup with a gastroenterologist regarding the blood in her stools. Followup with Dr. Eden Emms regarding the fluid around your heart. Return to ED feel better worsening symptoms.  Bloody Stools Bloody stools often mean that there is a problem in the digestive tract. Your caregiver may use the term "melena" to describe black, tarry, and bad smelling stools or "hematochezia" to describe red or maroon-colored stools. Blood seen in the stool can be caused by bleeding anywhere along the intestinal tract.  A black stool usually means that blood is coming from the upper part of the gastrointestinal tract (esophagus, stomach, or small bowel). Passing maroon-colored stools or bright red blood usually means that blood is coming from lower down in the large bowel or the rectum. However, sometimes massive bleeding in the stomach or small intestine can cause bright red bloody stools.  Consuming black licorice, lead, iron pills, medicines containing bismuth subsalicylate, or blueberries can also cause black stools. Your caregiver can test black stools to see if blood is present. It is important that the cause of the bleeding be found. Treatment can then be started, and the problem can be corrected. Rectal bleeding may not be serious, but you should not assume everything is okay until you know the cause.It is very important to follow up with your caregiver or a specialist in gastrointestinal problems. CAUSES  Blood in the stools can come from various underlying causes.Often, the cause is not found during your first visit. Testing is often needed to discover the cause of bleeding in the gastrointestinal tract. Causes range from simple to serious or even life-threatening.Possible causes include:  Hemorrhoids.These are veins that are full of blood (engorged) in the rectum. They cause pain, inflammation, and may bleed.   Anal fissures.These are areas of painful tearing which may bleed. They are often caused by passing  hard stool.   Diverticulosis.These are pouches that form on the colon over time, with age, and may bleed significantly.   Diverticulitis.This is inflammation in areas with diverticulosis. It can cause pain, fever, and bloody stools, although bleeding is rare.   Proctitis and colitis. These are inflamed areas of the rectum or colon. They may cause pain, fever, and bloody stools.   Polyps and cancer. Colon cancer is a leading cause of preventable cancer death.It often starts out as precancerous polyps that can be removed during a colonoscopy, preventing progression into cancer. Sometimes, polyps and cancer may cause rectal bleeding.   Gastritis and ulcers.Bleeding from the upper gastrointestinal tract (near the stomach) may travel through the intestines and produce black, sometimes tarry, often bad smelling stools. In certain cases, if the bleeding is fast enough, the stools may not be black, but red and the condition may be life-threatening.  SYMPTOMS  You may have stools that are bright red and bloody, that are normal color with blood on them, or that are dark black and tarry. In some cases, you may only have blood in the toilet bowl. Any of these cases need medical care. You may also have:  Pain at the anus or anywhere in the rectum.   Lightheadedness or feeling faint.   Extreme weakness.   Nausea or vomiting.   Fever.  DIAGNOSIS Your caregiver may use the following methods to find the cause of your bleeding:  Taking a medical history. Age is important. Older people tend to develop polyps and cancer more often. If there is anal pain and a hard, large stool associated with bleeding, a tear of the anus may be  the cause. If blood drips into the toilet after a bowel movement, bleeding hemorrhoids may be the problem. The color and frequency of the bleeding are additional considerations. In most cases, the medical history provides clues, but seldom the final answer.   A visual and finger  (digital) exam. Your caregiver will inspect the anal area, looking for tears and hemorrhoids. A finger exam can provide information when there is tenderness or a growth inside. In men, the prostate is also examined.   Endoscopy. Several types of small, long scopes (endoscopes) are used to view the colon.   In the office, your caregiver may use a rigid, or more commonly, a flexible viewing sigmoidoscope. This exam is called flexible sigmoidoscopy. It is performed in 5 to 10 minutes.   A more thorough exam is accomplished with a colonoscope. It allows your caregiver to view the entire 5 to 6 foot long colon. Medicine to help you relax (sedative) is usually given for this exam. Frequently, a bleeding lesion may be present beyond the reach of the sigmoidoscope. So, a colonoscopy may be the best exam to start with. Both exams are usually done on an outpatient basis. This means the patient does not stay overnight in the hospital or surgery center.   An upper endoscopy may be needed to examine your stomach. Sedation is used and a flexible endoscope is put in your mouth, down to your stomach.   A barium enema X-ray. This is an X-ray exam. It uses liquid barium inserted by enema into the rectum. This test alone may not identify an actual bleeding point. X-rays highlight abnormal shadows, such as those made by lumps (tumors), diverticuli, or colitis.  TREATMENT  Treatment depends on the cause of your bleeding.   For bleeding from the stomach or colon, the caregiver doing your endoscopy or colonoscopy may be able to stop the bleeding as part of the procedure.   Inflammation or infection of the colon can be treated with medicines.   Many rectal problems can be treated with creams, suppositories, or warm baths.   Surgery is sometimes needed.   Blood transfusions are sometimes needed if you have lost a lot of blood.   For any bleeding problem, let your caregiver know if you take aspirin or other blood  thinners regularly.  HOME CARE INSTRUCTIONS   Take any medicines exactly as prescribed.   Keep your stools soft by eating a diet high in fiber. Prunes (1 to 3 a day) work well for many people.   Drink enough water and fluids to keep your urine clear or pale yellow.   Take sitz baths if advised. A sitz bath is when you sit in a bathtub with warm water for 10 to 15 minutes to soak, soothe, and cleanse the rectal area.   If enemas or suppositories are advised, be sure you know how to use them. Tell your caregiver if you have problems with this.   Monitor your bowel movements to look for signs of improvement or worsening.  SEEK MEDICAL CARE IF:   You do not improve in the time expected.   Your condition worsens after initial improvement.   You develop any new symptoms.  SEEK IMMEDIATE MEDICAL CARE IF:   You develop severe or prolonged rectal bleeding.   You vomit blood.   You feel weak or faint.   You have a fever.  MAKE SURE YOU:  Understand these instructions.   Will watch your condition.   Will get help  right away if you are not doing well or get worse.  Document Released: 05/24/2002 Document Revised: 02/13/2011 Document Reviewed: 10/19/2010 [facility] Patient Information 2012 [place], [place].

## 2011-08-09 ENCOUNTER — Encounter (HOSPITAL_COMMUNITY): Payer: Self-pay | Admitting: Emergency Medicine

## 2011-08-16 DIAGNOSIS — I313 Pericardial effusion (noninflammatory): Secondary | ICD-10-CM

## 2011-08-16 DIAGNOSIS — I3139 Other pericardial effusion (noninflammatory): Secondary | ICD-10-CM

## 2011-08-16 HISTORY — DX: Pericardial effusion (noninflammatory): I31.3

## 2011-08-16 HISTORY — DX: Other pericardial effusion (noninflammatory): I31.39

## 2011-08-22 ENCOUNTER — Encounter: Payer: Self-pay | Admitting: Physician Assistant

## 2011-09-04 ENCOUNTER — Encounter: Payer: Self-pay | Admitting: Internal Medicine

## 2011-09-04 ENCOUNTER — Encounter: Payer: Self-pay | Admitting: Physician Assistant

## 2011-09-04 ENCOUNTER — Other Ambulatory Visit: Payer: Self-pay

## 2011-09-04 ENCOUNTER — Ambulatory Visit (INDEPENDENT_AMBULATORY_CARE_PROVIDER_SITE_OTHER): Payer: Medicare Other | Admitting: Physician Assistant

## 2011-09-04 ENCOUNTER — Ambulatory Visit (HOSPITAL_COMMUNITY): Payer: Medicare Other | Attending: Cardiovascular Disease

## 2011-09-04 VITALS — BP 148/78 | HR 98 | Wt 189.0 lb

## 2011-09-04 DIAGNOSIS — I313 Pericardial effusion (noninflammatory): Secondary | ICD-10-CM

## 2011-09-04 DIAGNOSIS — E119 Type 2 diabetes mellitus without complications: Secondary | ICD-10-CM | POA: Insufficient documentation

## 2011-09-04 DIAGNOSIS — J45909 Unspecified asthma, uncomplicated: Secondary | ICD-10-CM | POA: Insufficient documentation

## 2011-09-04 DIAGNOSIS — J449 Chronic obstructive pulmonary disease, unspecified: Secondary | ICD-10-CM

## 2011-09-04 DIAGNOSIS — I319 Disease of pericardium, unspecified: Secondary | ICD-10-CM | POA: Insufficient documentation

## 2011-09-04 DIAGNOSIS — Z9981 Dependence on supplemental oxygen: Secondary | ICD-10-CM | POA: Insufficient documentation

## 2011-09-04 DIAGNOSIS — I10 Essential (primary) hypertension: Secondary | ICD-10-CM

## 2011-09-04 NOTE — Assessment & Plan Note (Signed)
On home O2 

## 2011-09-04 NOTE — Assessment & Plan Note (Signed)
Patient's blood pressure is stable. There is no evidence of pulsus paradoxus on exam today

## 2011-09-04 NOTE — Progress Notes (Signed)
HPI:  This is a 66 year old African American female patient who is followed by Dr. Melburn Popper. Back in January 2012 she had several admissions for acute exacerbations of her COPD. She has stage III COPD and is on home oxygen. She was found to have a small pericardial effusion on CT scan in January and March of 2012. She last saw Dr. Eden Emms in followup in August of 2012 at which time repeat 2-D echo was performed and showed mild stable pericardial effusion.  The patient is referred to [place] today because a recent CT scan done on 08/08/11 showed a moderately large pericardial effusion.  The patient comes in today without cardiac complaints. She denies any worsening of her chronic dyspnea. She doesn't go out much and says the cold weather bothers her breathing. If she does have difficulty she uses a breathing treatment which improves her symptoms. She props herself up on pillows to sleep.  Allergies  Allergen Reactions  . Lisinopril Swelling    Angioedema   . Advair Diskus (Fluticasone-Salmeterol) Other (See Comments)    Nose bleeds.   . Penicillins     hives    Current Outpatient Prescriptions on File Prior to Visit  Medication Sig Dispense Refill  . albuterol (PROVENTIL) (2.5 MG/3ML) 0.083% nebulizer solution Take 2.5 mg by nebulization every 6 (six) hours as needed. For shortness of breath.      [name] albuterol-ipratropium (COMBIVENT) 18-103 MCG/ACT inhaler Inhale 2 puffs into the lungs every 6 (six) hours as needed. For shortness of breath.      . ALPRAZolam (XANAX) 0.25 MG tablet Take 0.25 mg by mouth as needed. For panic attacks.      . budesonide-formoterol (SYMBICORT) 80-4.5 MCG/ACT inhaler Inhale 2 puffs into the lungs 2 (two) times daily.        . Ferrous Sulfate (IRON) 325 (65 FE) MG TABS Take 1 tablet by mouth daily.        . hydrochlorothiazide 25 MG tablet Take 25 mg by mouth daily.        . indomethacin (INDOCIN) 25 MG capsule Take 25 mg by mouth daily as needed. For pain/gout.      [name]  losartan (COZAAR) 100 MG tablet Take 100 mg by mouth daily.        [name] lovastatin (MEVACOR) 40 MG tablet Take 40 mg by mouth at bedtime.        . metFORMIN (GLUCOPHAGE) 500 MG tablet Take 500 mg by mouth 2 (two) times daily with a meal.      . Multiple Vitamins-Minerals (MULTIPLE VITAMINS/WOMENS PO) Take 1 tablet by mouth daily.      [name] tiotropium (SPIRIVA) 18 MCG inhalation capsule Place 18 mcg into inhaler and inhale daily.         Past Medical History  Diagnosis Date  . Asthma   . COPD (chronic obstructive pulmonary disease)   . Hypertension   . Diabetes mellitus 09/13/2010    glucophage  . Gout   . Oxygen dependent     pt uses O2     Past Surgical History  Procedure Date  . Breast biopsy     both breasts  . Total abdominal hysterectomy     Family History  Problem Relation Age of Onset  . COPD Mother   . COPD Sister   . Heart disease Mother   . Cancer Father   . Diabetes Other     3 maternal aunts  . Prostate cancer Paternal Uncle     History  Social History  . Marital Status: Single    Spouse Name: N/A    Number of Children: N/A  . Years of Education: N/A   Occupational History  . retired    Social History Main Topics  . Smoking status: Former Smoker -- 1.0 packs/day for 30 years    Types: Cigarettes    Quit date: 06/17/2004  . Smokeless tobacco: Not on file  . Alcohol Use: 1.0 oz/week    2 drink(s) per week  . Drug Use: No     past marijuana use in 1980's.  [name] Sexually Active: Not on file   Other Topics Concern  . Not on file   Social History Narrative   Lives alone. Kids in [place] but not much contact. One first cousin and 6 second cousins in [place] but no contact. Moved from [place] to [place] in Dec 2011    ROS:see history of present illness otherwise negative   PHYSICAL EXAM: Well-nournished, in no acute distress.No pulsus paradoxus noted Neck: No JVD, HJR, Bruit, or thyroid enlargement Lungs: No tachypnea, clear without  wheezing, rales, or rhonchi Cardiovascular: RRR, PMI not displaced, heart sounds normal, no murmurs, gallops, bruit, thrill, or heave. Abdomen: BS normal. Soft without organomegaly, masses, lesions or tenderness. Extremities: without cyanosis, clubbing or edema. Good distal pulses bilateral SKin: Warm, no lesions or rashes  Musculoskeletal: No deformities Neuro: no focal signs  Wt 189 lb (85.73 kg)   ZOX:WRUEAV sinus rhythm at 98 beats per minute poor R-wave progression  2Decho: Study Conclusions  - Left ventricle: The cavity size was normal. Wall thickness was   increased in a pattern of moderate LVH. Systolic function was   normal. The estimated ejection fraction was in the range of 60% to   65%. Wall motion was normal; there were no regional wall motion   abnormalities. - Left atrium: The atrium was severely dilated. - Pulmonary arteries: PA peak pressure: 41mm Hg (S). - Pericardium, extracardiac: Small pericardial effusion surrounds   heart. There is RA indentation. Otherwise mitral inflow is normal   and IVC is normal. Does not meet criteria for tamponade. Clinical   correlation indicated.

## 2011-09-04 NOTE — Patient Instructions (Signed)
Your physician recommends that you schedule a follow-up appointment in:  [facility] available with Dr. Elease Hashimoto.    No medication changes at this time.    Your physician has requested that you have an echocardiogram. Echocardiography is a painless test that uses sound waves to create images of your heart. It provides your doctor with information about the size and shape of your heart and how well your heart's chambers and valves are working. This procedure takes approximately one hour. There are no restrictions for this procedure.  This will be done this afternoon.  Please be here at 3:45

## 2011-09-04 NOTE — Assessment & Plan Note (Signed)
Recent CT scan on 08/08/11 shows a stable moderately large pericardial effusion. The patient is asymptomatic other than her chronic dyspnea due to COPD. I discussed the patient with Dr. Elease Hashimoto who agrees with repeat 2-D echo and follow-up with him.

## 2011-09-09 ENCOUNTER — Ambulatory Visit (INDEPENDENT_AMBULATORY_CARE_PROVIDER_SITE_OTHER): Payer: Medicare Other | Admitting: Internal Medicine

## 2011-09-09 ENCOUNTER — Encounter: Payer: Self-pay | Admitting: Internal Medicine

## 2011-09-09 ENCOUNTER — Telehealth: Payer: Self-pay | Admitting: Cardiovascular Disease

## 2011-09-09 VITALS — BP 120/78 | HR 68 | Ht 62.5 in | Wt 189.0 lb

## 2011-09-09 DIAGNOSIS — K635 Polyp of colon: Secondary | ICD-10-CM

## 2011-09-09 DIAGNOSIS — D126 Benign neoplasm of colon, unspecified: Secondary | ICD-10-CM

## 2011-09-09 DIAGNOSIS — K921 Melena: Secondary | ICD-10-CM

## 2011-09-09 DIAGNOSIS — Z86010 Personal history of colonic polyps: Secondary | ICD-10-CM | POA: Insufficient documentation

## 2011-09-09 MED ORDER — PEG-KCL-NACL-NASULF-NA ASC-C 100 G PO SOLR: 1.0000 | Freq: Once | ORAL | Status: DC

## 2011-09-09 NOTE — Telephone Encounter (Signed)
Returned call, lmtcb. 

## 2011-09-09 NOTE — Patient Instructions (Signed)
You have been scheduled for a colonoscopy with propofol. Please follow written instructions given to you at your visit today.  Please pick up your prep kit at the pharmacy within the next 1-3 days.  

## 2011-09-09 NOTE — Telephone Encounter (Signed)
FU Call: pt returning call to Jodette. Please return pt call to discuss further.

## 2011-09-09 NOTE — Progress Notes (Signed)
Subjective:    Patient ID: [name], female    DOB: 01-09-1946, 66 y.o.   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 Renato Gails for evaluation of recent hematochezia. The patient reports she has a history of colorectal polyps, with her last colonoscopy being 3 years ago in [place]. She was told to have a repeat colonoscopy in 3 years. Several weeks ago she developed 2 episodes of maroon stools mixed with brown stool. This occurred twice, cause significant anxiety for the patient, and prompted an ER visit. In the ER she was evaluated, noted to have stable blood counts, and discharged with recommendations to follow up with GI. This episode was painless, and she denies abdominal pain, nausea, vomiting. She has very occasional heartburn for which she treats with TUMS with good relief. No dysphagia or odynophagia. No melena. No diarrhea or constipation. No further bleeding episodes. No fevers or chills.  Her breathing is at baseline for her, she reports being on 1.5-2 L of oxygen day and night  Review of Systems As per history of present illness, otherwise negative  Patient Active Problem List  Diagnoses  . COPD (chronic obstructive pulmonary disease)  . COPD, frequent exacerbations  . Pericardial effusion  . Mixed hyperlipidemia  . Hypertension  -history of colon polyps  Past Surgical History  Procedure Date  . Breast biopsy     both breasts  . Total abdominal hysterectomy    Current Outpatient Prescriptions  Medication Sig Dispense Refill  . albuterol (PROVENTIL) (2.5 MG/3ML) 0.083% nebulizer solution Take 2.5 mg by nebulization every 6 (six) hours as needed. For shortness of breath.      . ALPRAZolam (XANAX) 0.25 MG tablet Take 0.25 mg by mouth as needed. For  panic attacks.      . budesonide-formoterol (SYMBICORT) 80-4.5 MCG/ACT inhaler Inhale 2 puffs into the lungs 2 (two) times daily.        . Ferrous Sulfate (IRON) 325 (65 FE) MG TABS Take 1 tablet by mouth daily.        . hydrochlorothiazide 25 MG tablet Take 25 mg by mouth daily.        [name] losartan (COZAAR) 100 MG tablet Take 100 mg by mouth daily.        [name] lovastatin (MEVACOR) 40 MG tablet Take 40 mg by mouth at bedtime.        . metFORMIN (GLUCOPHAGE) 500 MG tablet Take 500 mg by mouth 2 (two) times daily with a meal.      . Multiple Vitamins-Minerals (MULTIPLE VITAMINS/WOMENS PO) Take 1 tablet by mouth daily.      . sertraline (ZOLOFT) 25 MG tablet Take 25 mg by mouth 2 (two) times daily.      [name] tiotropium (SPIRIVA) 18 MCG inhalation capsule Place 18 mcg into inhaler and inhale daily.       . indomethacin (INDOCIN) 25 MG capsule Take 25 mg by mouth daily as needed. For pain/gout.      . peg 3350 powder (MOVIPREP) 100 G SOLR Take 1 kit (100 g total) by mouth once.  1 kit  0   Allergies  Allergen Reactions  . Lisinopril Swelling    Angioedema   . Advair Diskus (Fluticasone-Salmeterol) Other (See Comments)    Nose bleeds.   . Penicillins  hives   Family History  Problem Relation Age of Onset  . COPD Mother   . COPD Sister   . Heart disease Mother   . Cancer Father   . Diabetes Other     3 maternal aunts  . Prostate cancer Paternal Uncle    History   Social History  . Marital Status: Single    Spouse Name: N/A    Number of Children: 2  . Years of Education: N/A   Occupational History  . retired    Social History Main Topics  . Smoking status: Former Smoker -- 1.0 packs/day for 30 years    Types: Cigarettes    Quit date: 06/17/2004  . Smokeless tobacco: Never Used  . Alcohol Use: 1.0 oz/week    2 drink(s) per week  . Drug Use: No     past marijuana use in 1980's.  [name] Sexually Active: None   Other Topics Concern  . None   Social History Narrative   Lives alone.  Kids in [place] but not much contact. One first cousin and 6 second cousins in [place] but no contact. Moved from [place] to [place] in Dec 2011       Objective:   Physical Exam BP 120/78  Pulse 68  Ht 5' 2.5" (1.588 m)  Wt 189 lb (85.73 kg)  BMI 34.02 kg/m2 Constitutional: Well-developed and well-nourished. No distress. HEENT: Normocephalic and atraumatic. Oropharynx is clear and moist. No oropharyngeal exudate. Conjunctivae are normal. Pupils are equal round and reactive to light. No scleral icterus. Neck: Neck supple. Trachea midline. Cardiovascular: Normal rate, regular rhythm and intact distal pulses. No M/R/G Pulmonary/chest: Effort normal and breath sounds normal. No wheezing, rales or rhonchi. Abdominal: Soft, obese, nontender, nondistended. Bowel sounds active throughout. There are no masses palpable. No hepatosplenomegaly. Extremities: no clubbing, cyanosis, trace pretibial edema Lymphadenopathy: No cervical adenopathy noted. Neurological: Alert and oriented to person place and time. Skin: Skin is warm and dry. No rashes noted. Psychiatric: Normal mood and affect. Behavior is normal.  CBC    Component Value Date/Time   WBC 7.5 08/08/2011 1719   RBC 3.76* 08/08/2011 1719   HGB 10.1* 08/08/2011 1719   HCT 32.8* 08/08/2011 1719   PLT 288 08/08/2011 1719   MCV 87.2 08/08/2011 1719   MCH 26.9 08/08/2011 1719   MCHC 30.8 08/08/2011 1719   RDW 15.8* 08/08/2011 1719   LYMPHSABS 1.5 08/08/2011 1719   MONOABS 0.5 08/08/2011 1719   EOSABS 0.1 08/08/2011 1719   BASOSABS 0.0 08/08/2011 1719    CMP     Component Value Date/Time   NA 144 08/08/2011 1719   K 4.1 08/08/2011 1719   CL 102 08/08/2011 1719   CO2 36* 08/08/2011 1719   GLUCOSE 119* 08/08/2011 1719   BUN 26* 08/08/2011 1719   CREATININE 1.05 08/08/2011 1719   CALCIUM 10.3 08/08/2011 1719   PROT 7.4 08/08/2011 1719   ALBUMIN 3.8 08/08/2011 1719   AST 17 08/08/2011 1719   ALT 17 08/08/2011 1719   ALKPHOS 70 08/08/2011  1719   BILITOT 0.1* 08/08/2011 1719   GFRNONAA 55* 08/08/2011 1719   GFRAA 63* 08/08/2011 1719      Assessment & Plan:  66 year old female with a past medical history of 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 Renato Gails for evaluation of recent hematochezia.  1. Hematochezia/history of colon polyp -- given the  patient's recent isolated episodes of painless hematochezia and her history of colorectal polyps, I recommend repeat colonoscopy. We discussed the risks and benefits of this test and she agrees to proceed. She does have a history of nausea and-dependent COPD, and assuming her disease at baseline, I feel she is appropriate for outpatient colonoscopy in our endoscopy center. Sedation will be with propofol, which she understands may need to be lighter than normal sedation given her COPD. The differential diagnosis for bleeding which we discussed includes diverticulosis, polyp bleeding, AVM, or less likely hemorrhoids.  Further recommendations after procedure

## 2011-09-10 ENCOUNTER — Telehealth: Payer: Self-pay | Admitting: Internal Medicine

## 2011-09-10 ENCOUNTER — Ambulatory Visit (INDEPENDENT_AMBULATORY_CARE_PROVIDER_SITE_OTHER): Payer: Medicare Other | Admitting: Cardiovascular Disease

## 2011-09-10 ENCOUNTER — Encounter: Payer: Self-pay | Admitting: Cardiovascular Disease

## 2011-09-10 VITALS — BP 138/90 | HR 101 | Ht 62.5 in | Wt 189.0 lb

## 2011-09-10 DIAGNOSIS — I319 Disease of pericardium, unspecified: Secondary | ICD-10-CM

## 2011-09-10 DIAGNOSIS — I313 Pericardial effusion (noninflammatory): Secondary | ICD-10-CM

## 2011-09-10 NOTE — Progress Notes (Signed)
[name] Date of Birth  06/05/1946 [facility]     [place] Office  [address]    [address]   [address] [place], [place]  [address]    [place], [place]  [address] [phone]  Fax  [phone]  [phone]  Fax [phone]  Problem list: 1. Pericardial effusion 2. COPD 3. Hyperlipidemia 4. Hypertension 5. Diabetes mellitus 6. Anemia  History of Present Illness:  [name] is doing fairly well. She was recent seen by our physician's assistant. She was found to have a small to moderate-sized pericardial effusion by CT scan. A followup echocardiogram revealed only a small pericardial effusion without any evidence of pericardial tamponade.  Current Outpatient Prescriptions on File Prior to Visit  Medication Sig Dispense Refill  . albuterol (PROVENTIL) (2.5 MG/3ML) 0.083% nebulizer solution Take 2.5 mg by nebulization every 6 (six) hours as needed. For shortness of breath.      . ALPRAZolam (XANAX) 0.25 MG tablet Take 0.25 mg by mouth as needed. For panic attacks.      . budesonide-formoterol (SYMBICORT) 80-4.5 MCG/ACT inhaler Inhale 2 puffs into the lungs 2 (two) times daily.        . Ferrous Sulfate (IRON) 325 (65 FE) MG TABS Take 1 tablet by mouth daily.        . hydrochlorothiazide 25 MG tablet Take 25 mg by mouth daily.        . indomethacin (INDOCIN) 25 MG capsule Take 25 mg by mouth daily as needed. For pain/gout.      [name] losartan (COZAAR) 100 MG tablet Take 100 mg by mouth daily.        [name] lovastatin (MEVACOR) 40 MG tablet Take 40 mg by mouth at bedtime.        . metFORMIN (GLUCOPHAGE) 500 MG tablet Take 500 mg by mouth 2 (two) times daily with a meal.      . Multiple Vitamins-Minerals (MULTIPLE VITAMINS/WOMENS PO) Take 1 tablet by mouth daily.      . peg 3350 powder (MOVIPREP) 100 G SOLR Take 1 kit (100 g total) by mouth once.  1 kit  0  . sertraline (ZOLOFT) 25 MG tablet Take 25 mg by mouth 2 (two) times daily.      [name] tiotropium  (SPIRIVA) 18 MCG inhalation capsule Place 18 mcg into inhaler and inhale daily.         Allergies  Allergen Reactions  . Lisinopril Swelling    Angioedema   . Advair Diskus (Fluticasone-Salmeterol) Other (See Comments)    Nose bleeds.   . Penicillins     hives    Past Medical History  Diagnosis Date  . Asthma   . COPD (chronic obstructive pulmonary disease)   . Hypertension   . Diabetes mellitus 09/13/2010    glucophage  . Gout   . Oxygen dependent     pt uses O2     Past Surgical History  Procedure Date  . Breast biopsy     both breasts  . Total abdominal hysterectomy     History  Smoking status  . Former Smoker -- 1.0 packs/day for 30 years  . Types: Cigarettes  . Quit date: 06/17/2004  Smokeless tobacco  . Never Used    History  Alcohol Use  . 1.0 oz/week  . 2 drink(s) per week    Family History  Problem Relation Age of Onset  . COPD Mother   . COPD Sister   . Heart disease Mother   .  Cancer Father   . Diabetes Other     3 maternal aunts  . Prostate cancer Paternal Uncle     Reviw of Systems:  Reviewed in the HPI.  All other systems are negative.  Physical Exam: Blood pressure 138/90, pulse 101, height 5' 2.5" (1.588 m), weight 189 lb (85.73 kg). General: Well developed, well nourished, in no acute distress.  Head: Normocephalic, atraumatic, sclera non-icteric, mucus membranes are moist,   Neck: Supple. Carotids are 2 + without bruits. No JVD  Lungs: Clear bilaterally to auscultation.  Heart: regular rate.  normal  S1 S2. No murmurs, gallops or rubs.  Abdomen: Soft, non-tender, non-distended with normal bowel sounds. No hepatomegaly. No rebound/guarding. No masses.  Msk:  Strength and tone are normal  Extremities: No clubbing or cyanosis. No edema.  Distal pedal pulses are 2+ and equal bilaterally.  Neuro: Alert and oriented X 3. Moves all extremities spontaneously.  Psych:  Responds to questions appropriately with a normal  affect.  ECG:  Assessment / Plan:

## 2011-09-10 NOTE — Telephone Encounter (Signed)
PT WAS GIVEN RESULTS ON OV

## 2011-09-10 NOTE — Patient Instructions (Signed)
Your physician wants you to follow-up in:  6 months. You will receive a reminder letter in the mail two months in advance. If you don't receive a letter, please call our office to schedule the follow-up appointment.   

## 2011-09-10 NOTE — Assessment & Plan Note (Signed)
[name] is doing well. Her pericardial effusion is mild by echocardiography. There is no evidence of pericardial tamponade.  She is to continue with her same medications. I'll see her again in 6 months for followup visit. We will plan on getting another echocardiogram in one year.

## 2011-09-11 ENCOUNTER — Telehealth: Payer: Self-pay | Admitting: Gastroenterology

## 2011-09-11 NOTE — Telephone Encounter (Signed)
Called pt. Told her I will have a free moviprep coupon waiting for her at the front desk. She said she will be here tomorrow to pick it up.

## 2011-09-19 NOTE — Telephone Encounter (Signed)
CMA opened another phone note 09-11-11.

## 2011-09-30 ENCOUNTER — Encounter: Payer: Self-pay | Admitting: Internal Medicine

## 2011-09-30 ENCOUNTER — Ambulatory Visit (AMBULATORY_SURGERY_CENTER): Payer: Medicare Other | Admitting: Internal Medicine

## 2011-09-30 VITALS — BP 148/94 | HR 77 | Temp 98.7°F | Resp 19 | Ht 62.5 in | Wt 189.0 lb

## 2011-09-30 DIAGNOSIS — D126 Benign neoplasm of colon, unspecified: Secondary | ICD-10-CM

## 2011-09-30 DIAGNOSIS — K921 Melena: Secondary | ICD-10-CM

## 2011-09-30 DIAGNOSIS — Z86010 Personal history of colonic polyps: Secondary | ICD-10-CM

## 2011-09-30 MED ORDER — SODIUM CHLORIDE 0.9 % IV SOLN: 500.0000 mL | INTRAVENOUS | Status: DC

## 2011-09-30 NOTE — Patient Instructions (Signed)
YOU HAD AN ENDOSCOPIC PROCEDURE TODAY AT THE [facility]: Refer to the procedure report that was given to you for any specific questions about what was found during the examination.  If the procedure report does not answer your questions, please call your gastroenterologist to clarify.  If you requested that your care partner not be given the details of your procedure findings, then the procedure report has been included in a sealed envelope for you to review at your convenience later.  YOU SHOULD EXPECT: Some feelings of bloating in the abdomen. Passage of more gas than usual.  Walking can help get rid of the air that was put into your GI tract during the procedure and reduce the bloating. If you had a lower endoscopy (such as a colonoscopy or flexible sigmoidoscopy) you may notice spotting of blood in your stool or on the toilet paper. If you underwent a bowel prep for your procedure, then you may not have a normal bowel movement for a few days.  DIET: Your first meal following the procedure should be a light meal and then it is ok to progress to your normal diet.  A half-sandwich or bowl of soup is an example of a good first meal.  Heavy or fried foods are harder to digest and may make you feel nauseous or bloated.  Likewise meals heavy in dairy and vegetables can cause extra gas to form and this can also increase the bloating.  Drink plenty of fluids but you should avoid alcoholic beverages for 24 hours.  ACTIVITY: Your care partner should take you home directly after the procedure.  You should plan to take it easy, moving slowly for the rest of the day.  You can resume normal activity the day after the procedure however you should NOT DRIVE or use heavy machinery for 24 hours (because of the sedation medicines used during the test).    SYMPTOMS TO REPORT IMMEDIATELY: A gastroenterologist can be reached at any hour.  During normal business hours, 8:30 AM to 5:00 PM Monday through Friday,  call [phone].  After hours and on weekends, please call the GI answering service at [phone] who will take a message and have the physician on call contact you.   Following lower endoscopy (colonoscopy or flexible sigmoidoscopy):  Excessive amounts of blood in the stool  Significant tenderness or worsening of abdominal pains  Swelling of the abdomen that is new, acute  Fever of 100F or higher    FOLLOW UP: If any biopsies were taken you will be contacted by phone or by letter within the next 1-3 weeks.  Call your gastroenterologist if you have not heard about the biopsies in 3 weeks.  Our staff will call the home number listed on your records the next business day following your procedure to check on you and address any questions or concerns that you may have at that time regarding the information given to you following your procedure. This is a courtesy call and so if there is no answer at the home number and we have not heard from you through the emergency physician on call, we will assume that you have returned to your regular daily activities without incident.  SIGNATURES/CONFIDENTIALITY: You and/or your care partner have signed paperwork which will be entered into your electronic medical record.  These signatures attest to the fact that that the information above on your After Visit Summary has been reviewed and is understood.  Full responsibility of the confidentiality   of this discharge information lies with you and/or your care-partner.   Information on polyps ,diverticulosis, hemorrhoids, high fiber diet given to you today, as well as a copy of your procedure report

## 2011-09-30 NOTE — Progress Notes (Signed)
Patient did not experience any of the following events: a burn prior to discharge; a fall within the facility; wrong site/side/patient/procedure/implant event; or a hospital transfer or hospital admission upon discharge from the facility. (G8907) Patient did not have preoperative order for IV antibiotic SSI prophylaxis. (G8918)  

## 2011-09-30 NOTE — Op Note (Signed)
[facility] [address] [place], [place]  [address]  COLONOSCOPY PROCEDURE REPORT  PATIENT:  Bich, Mchaney  MR#:  [id number] BIRTHDATE:  1945-12-07, 65 yrs. old  GENDER:  female ENDOSCOPIST:  Carie Caddy. Mahmood Boehringer, MD REF. BY:  Bufford Spikes, M.D. PROCEDURE DATE:  09/30/2011 PROCEDURE:  Colonoscopy with snare polypectomy ASA CLASS:  Class III INDICATIONS:  history of polyps, hematochezia MEDICATIONS:   MAC sedation, administered by CRNA, propofol (Diprivan) 350 mg IV  DESCRIPTION OF PROCEDURE:   After the risks benefits and alternatives of the procedure were thoroughly explained, informed consent was obtained.  Digital rectal exam was performed and revealed no rectal masses.   The LB 180AL [id number] endoscope was introduced through the anus and advanced to the cecum, which was identified by both the appendix and ileocecal valve, without limitations.  The quality of the prep was good, using MoviPrep. The instrument was then slowly withdrawn as the colon was fully examined. <<PROCEDUREIMAGES>>  FINDINGS:  A 6 mm sessile polyp was found in the cecum. Polyp was snared without cautery. Retrieval was successful.  A 5 mm sessile polyp was found in the proximal transverse colon. Polyp was snared without cautery. Retrieval was successful.   Three sessile polyps (< 5 mm) were found in the recto-sigmoid colon. Polyps were snared without cautery. Retrieval was successful.   Mild diverticulosis was found ascending colon to sigmoid colon.  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   Retroflexed views in the rectum revealed internal hemorrhoids.    The scope was then withdrawn  from the cecum and the procedure completed.  COMPLICATIONS:  None ENDOSCOPIC IMPRESSION: 1) Sessile polyp in the cecum. Removed and sent to pathology. 2) Sessile polyp in the proximal transverse colon. Removed and sent to pathology. 3) Three polyps in the recto-sigmoid colon.  Removed and sent to pathology. 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 6) Small internal hemorrhoids  RECOMMENDATIONS: 1) Hold aspirin, aspirin products, and anti-inflammatory medication for 1 week. 2) Await pathology results 3) High fiber diet. 4) If the polyp(s) removed today are proven to be adenomatous (pre-cancerous) polyps, you will need a colonoscopy in 3 years. Otherwise you should continue to follow colorectal cancer screening guidelines for "routine risk" patients with a colonoscopy in 10 years. You will receive a letter within 1-2 weeks with the results of your biopsy as well as final recommendations. Please call my office if you have not received a letter after 3 weeks. 5) If iron deficiency anemia becomes an issue, then consider repeat colonoscopy for APC treatment of cecal angioectasia (APC not available at [facility]).  Carie Caddy. Rhea Belton, MD  CC:  The Patient Bufford Spikes, M.D.  n. eSIGNED:   Carie Caddy. Chaylee Ehrsam at 09/30/2011 02:20 PM  Chip Boer, [phone]

## 2011-10-01 ENCOUNTER — Telehealth: Payer: Self-pay | Admitting: *Deleted

## 2011-10-01 NOTE — Telephone Encounter (Signed)
  Follow up Call-  Call back number 09/30/2011  Post procedure Call Back phone  # [phone]  Permission to leave phone message Yes     Patient questions:  Do you have a fever, pain , or abdominal swelling? no Pain Score  0 *  Have you tolerated food without any problems? yes  Have you been able to return to your normal activities? yes  Do you have any questions about your discharge instructions: Diet   no Medications  no Follow up visit  no  Do you have questions or concerns about your Care? no  Actions: * If pain score is 4 or above: No action needed, pain <4.

## 2011-10-04 ENCOUNTER — Encounter: Payer: Self-pay | Admitting: Internal Medicine

## 2011-10-24 ENCOUNTER — Encounter (HOSPITAL_COMMUNITY)
Admission: RE | Admit: 2011-10-24 | Discharge: 2011-10-24 | Disposition: A | Discharge status: Home / Self Care | Payer: Medicare Other | Source: Ambulatory Visit | Attending: Internal Medicine | Admitting: Internal Medicine

## 2011-10-24 ENCOUNTER — Encounter (HOSPITAL_COMMUNITY): Payer: Self-pay

## 2011-10-24 DIAGNOSIS — J4489 Other specified chronic obstructive pulmonary disease: Secondary | ICD-10-CM | POA: Insufficient documentation

## 2011-10-24 DIAGNOSIS — J449 Chronic obstructive pulmonary disease, unspecified: Secondary | ICD-10-CM | POA: Insufficient documentation

## 2011-10-24 DIAGNOSIS — I10 Essential (primary) hypertension: Secondary | ICD-10-CM | POA: Insufficient documentation

## 2011-10-24 DIAGNOSIS — E785 Hyperlipidemia, unspecified: Secondary | ICD-10-CM | POA: Insufficient documentation

## 2011-10-24 DIAGNOSIS — Z5189 Encounter for other specified aftercare: Secondary | ICD-10-CM | POA: Insufficient documentation

## 2011-10-24 NOTE — Progress Notes (Signed)
Mrs Dattilio came to [facility] today. History and medications were reviewed with patient.  Nurse assessment done.  6 min walk test done, oxygen levels 92-96 % on 2l.  Tolerated well.  Demonstration and practice of PLB using pulse oximeter.  Patient able to return demonstration satisfactorily. Safety and hand hygiene in the exercise area reviewed with patient.  Patient voices understanding.

## 2011-11-04 ENCOUNTER — Encounter: Payer: Self-pay | Admitting: Internal Medicine

## 2011-11-04 ENCOUNTER — Ambulatory Visit (INDEPENDENT_AMBULATORY_CARE_PROVIDER_SITE_OTHER): Payer: Medicare Other | Admitting: Internal Medicine

## 2011-11-04 VITALS — BP 130/82 | HR 84 | Temp 97.8°F | Ht 63.0 in | Wt 193.8 lb

## 2011-11-04 DIAGNOSIS — E669 Obesity, unspecified: Secondary | ICD-10-CM | POA: Insufficient documentation

## 2011-11-04 DIAGNOSIS — J449 Chronic obstructive pulmonary disease, unspecified: Secondary | ICD-10-CM

## 2011-11-04 NOTE — Assessment & Plan Note (Signed)
Body mass index is 34.33 kg/(m^2).  PLAN  - we discussed in detail low glycemic diet and benefits  - went over [facility] diet sheet; she is going tot try this - discipine emphasized

## 2011-11-04 NOTE — Assessment & Plan Note (Signed)
#  COPD  Your severe copd is currently stable Continue spiriva, symbicort and oxygen Glad you are starting rehab tomorrow Call anytime if you feel symptoms are getting worse or any change At followup do CAT SCORE

## 2011-11-04 NOTE — Progress Notes (Signed)
Subjective:    Patient ID: [name], female    DOB: May 01, 1946, 66 y.o.   MRN: [id number]  HPI 66 yo female with known hx of  1. Severe/ very severe  COPD -O2 depenedent (advair causes epistaxis)   - Spirometry Nov 2012: Fev1 0.85L/45%, Ratio - CAT score 10 - May 2013 2. Recurrent AECOPD -   - Oct 2011, JAn 2012, March 2012 and August 2012.  3. Small but increasing pericardial effusion on CT jan and march 2012. Moderate Rt atrial collapse in August 2012 - Dr Elease Hashimoto since Aug 2012 4. Isolated social situation.  5. Ex smoker  6.hx of snoring and excess daytime somnolence but not using cpap; no dx of OSA.  7. Body mass index is 33.98 kg/(m^2). - on 08/01/2011  - Body mass index is 34.33 kg/(m^2). - 11/04/2011       OV 02/27/11: Followup for above issues. Last seen by NP 1 months ago for med calendar folliwng her aecopd admit month ago. Currently states dyspnea is stable class 3. No change in cough and sputum. She wants a scooter for increased mobility. In talking to her she lives alone without friends or family. She is doing ADLs and groceries all by herself. States walking from parking lot to fron t entrance of [employer] makes her severely dyspneic and unable to proceed further. She wishes she had new lungs but realizes and does not want option of transplant. She desires improved quality of life and improved dyspnea and able to attend church which she stopped several years ago  REC We will do prescription for scooter or check with pmd if he/she can do  You could consider a home hospice program that could benefit your quality of life, shortness of breath, symptoms  Congrats on flu shot  Return in 8 weeks to report progress  Have spirometry on return  OV 04/19/11: Followup COPD. No new prblems. Living at home. Stable health. Good days and bad days. Gets dyspneic walking 10 feet. Not much cough. Some white very little sputum 1-2 days. Denies chst pain. Has not pursued issue of scooter  with PMD. She wishes for scooter to improve mobility. Spirometry today: Fev1 0.85L/45%, Ratio   No change in past, family, social hx: admits mother and sister have copd  Your copd is severe stage 3 but not very severe stage 4 as I originally thought  I think you are deconditioned and this is contributing to your symptoms  So, I do not think scooter is the answer at this point  I would like for you to attend pulmonary rehab at [place]; nurse will do referral  Check blood for alpha 1 genetic test for copd  Return in 2 and 1/2 month for copd followup  Continue your medications as before  Note: if your current sputum or cough or shortness of breath gets worse, call us immediately       OV 08/01/2011 Followup COPD. Still living alone. Reports compliance with o2 and medications. Still not attended rehab; says never got called. Diagnosed iwht panic disoreder in interim and on zyprexa and she reports significant improvement in dyspnea, and mood since then. OVerall well. No specific complaints. No weight loss. No AECOPD symptoms. Has had flu shot  Past, Family, Social reviewed: no change since last visit other than diagnosis of panic   Your severe copd is currently stable  Continue spiriva, symbicort and oxygen  Nurse will re-refer you to pulmonary rehab - do not know why they  did not call  Glad you had flu shot  Will check alpha 1 today - genetic test for copd  REturn in 3 months  Call anytime if you feel symptoms are getting worse or any change  At followup do spirometry   OV 11/04/2011 Followup COPD  Feels better than ever before. fEels Baseline health. COPD symptom CAt score 10 and reflects low symptom burden despite severity of disease. Due to start rehab tomorrow. Compliant with meds. Want to lose weight   CAT COPD Symptom and Quality of Life Score (glaxo smith k[employer] trademark)  0 (no burden) to 5 (highest burden)  Never Cough -> Cough all the time 0  No phlegm in chest ->  Chest is full of phlegm 0  No chest tightness -> Chest feels very tight 3  No dyspnea for 1 flight stairs/hill -> Very dyspneic for 1 flight of stairs 5  No limitations for ADL at home -> Very limited with ADL at home 0  Confident leaving home -> Not at all confident leaving home 0  Sleep soundly -> Do not sleep soundly because of lung condition 0  Lots of Energy -> No energy at all 2  TOTAL Score (max 40)  10    Past, Family, Social reviewed: no change since last visit   Review of Systems  Constitutional: Negative for fever and unexpected weight change.  HENT: Negative for ear pain, nosebleeds, congestion, sore throat, rhinorrhea, sneezing, trouble swallowing, dental problem, postnasal drip and sinus pressure.   Eyes: Negative for redness and itching.  Respiratory: Negative for cough, chest tightness, shortness of breath and wheezing.   Cardiovascular: Negative for palpitations and leg swelling.  Gastrointestinal: Negative for nausea and vomiting.  Genitourinary: Negative for dysuria.  Musculoskeletal: Negative for joint swelling.  Skin: Negative for rash.  Neurological: Negative for headaches.  Hematological: Does not bruise/bleed easily.  Psychiatric/Behavioral: Negative for dysphoric mood. The patient is not nervous/anxious.        Objective:   Physical Exam  GEN: A/Ox3; pleasant , NAD, chronically ill appearing.   HEENT:  [place]/AT,  EACs-clear, TMs-wnl, NOSE-clear, THROAT-clear, no lesions, no postnasal drip or exudate noted.   NECK:  Supple w/ fair ROM; no JVD; normal carotid impulses w/o bruits; no thyromegaly or nodules palpated; no lymphadenopathy.  RESP  Coarse BS w/ no wheezing . no accessory muscle use, no dullness to percussion  CARD:  RRR, no m/r/g  , 1+ peripheral edema, pulses intact, no cyanosis or clubbing.  GI:   Soft & nt; nml bowel sounds; no organomegaly or masses detected.  Musco: Warm bil, no deformities or joint swelling noted.   Neuro: alert, no  focal deficits noted.    Skin: Warm, no lesions or rashes         Assessment & Plan:

## 2011-11-04 NOTE — Patient Instructions (Signed)
#  COPD  Your severe copd is currently stable Continue spiriva, symbicort and oxygen Glad you are starting rehab tomorrow Call anytime if you feel symptoms are getting worse or any change At followup do CAT SCORE   #WEight  - follow [facility] diet plan I outlined  #Followup  6 months or sooner if needed

## 2011-11-05 ENCOUNTER — Encounter (HOSPITAL_COMMUNITY)
Admission: RE | Admit: 2011-11-05 | Discharge: 2011-11-05 | Disposition: A | Discharge status: Home / Self Care | Payer: Medicare Other | Source: Ambulatory Visit | Attending: Internal Medicine | Admitting: Internal Medicine

## 2011-11-05 NOTE — Progress Notes (Signed)
Ist day of exercise in Pulmonary Rehab for [name].  She arrived very breathless after walking from parking deck carrying her portable oxygen and also a portable e-cylinder.  Perspiring, very short of breath.  Allowed to rest, used rescue inhaler and  reinforced PLB.  Oxygen saturation was at 99% on 3Liters.  She began exercise, oriented to equipment use and safety.  Continued demonstration and practice of PLB on each exercise station.  Vital signs stable with exercise. Tolerated well.  She was taken to her auto via wheelchair and reviewed the use of Valet Parking until she is stronger.  Patient agrees. [name]

## 2011-11-07 ENCOUNTER — Encounter (HOSPITAL_COMMUNITY)
Admission: RE | Admit: 2011-11-07 | Discharge: 2011-11-07 | Disposition: A | Discharge status: Home / Self Care | Payer: Medicare Other | Source: Ambulatory Visit | Attending: Internal Medicine | Admitting: Internal Medicine

## 2011-11-12 ENCOUNTER — Encounter (HOSPITAL_COMMUNITY)
Admission: RE | Admit: 2011-11-12 | Discharge: 2011-11-12 | Disposition: A | Discharge status: Home / Self Care | Payer: Medicare Other | Source: Ambulatory Visit | Attending: Internal Medicine | Admitting: Internal Medicine

## 2011-11-14 ENCOUNTER — Encounter (HOSPITAL_COMMUNITY)
Admission: RE | Admit: 2011-11-14 | Discharge: 2011-11-14 | Disposition: A | Discharge status: Home / Self Care | Payer: Medicare Other | Source: Ambulatory Visit | Attending: Internal Medicine | Admitting: Internal Medicine

## 2011-11-14 LAB — GLUCOSE, CAPILLARY
Glucose-Capillary: 108 mg/dL — ABNORMAL HIGH (ref 70–99)
Glucose-Capillary: 99 mg/dL (ref 70–99)

## 2011-11-19 ENCOUNTER — Encounter (HOSPITAL_COMMUNITY)
Admission: RE | Admit: 2011-11-19 | Discharge: 2011-11-19 | Disposition: A | Discharge status: Home / Self Care | Payer: Medicare Other | Source: Ambulatory Visit | Attending: Internal Medicine | Admitting: Internal Medicine

## 2011-11-19 DIAGNOSIS — I10 Essential (primary) hypertension: Secondary | ICD-10-CM | POA: Insufficient documentation

## 2011-11-19 DIAGNOSIS — J449 Chronic obstructive pulmonary disease, unspecified: Secondary | ICD-10-CM | POA: Insufficient documentation

## 2011-11-19 DIAGNOSIS — E785 Hyperlipidemia, unspecified: Secondary | ICD-10-CM | POA: Insufficient documentation

## 2011-11-19 DIAGNOSIS — Z5189 Encounter for other specified aftercare: Secondary | ICD-10-CM | POA: Insufficient documentation

## 2011-11-19 DIAGNOSIS — J4489 Other specified chronic obstructive pulmonary disease: Secondary | ICD-10-CM | POA: Insufficient documentation

## 2011-11-19 LAB — GLUCOSE, CAPILLARY
Glucose-Capillary: 119 mg/dL — ABNORMAL HIGH (ref 70–99)
Glucose-Capillary: 116 mg/dL — ABNORMAL HIGH (ref 70–99)

## 2011-11-21 ENCOUNTER — Encounter (HOSPITAL_COMMUNITY)
Admission: RE | Admit: 2011-11-21 | Discharge: 2011-11-21 | Disposition: A | Discharge status: Home / Self Care | Payer: Medicare Other | Source: Ambulatory Visit | Attending: Internal Medicine | Admitting: Internal Medicine

## 2011-11-21 LAB — GLUCOSE, CAPILLARY: Glucose-Capillary: 150 mg/dL — ABNORMAL HIGH (ref 70–99)

## 2011-11-26 ENCOUNTER — Encounter (HOSPITAL_COMMUNITY)
Admission: RE | Admit: 2011-11-26 | Discharge: 2011-11-26 | Disposition: A | Discharge status: Home / Self Care | Payer: Medicare Other | Source: Ambulatory Visit | Attending: Internal Medicine | Admitting: Internal Medicine

## 2011-11-26 NOTE — Progress Notes (Signed)
[name] 66 y.o. female Nutrition Note Spoke with pt. Pt is obese.  Pt eats 2 meals a day; most prepared at home.  There are some ways the pt can make her eating healthier for her lungs.  Pt's Rate Your Plate results reviewed with pt.  Pt expressed understanding.  Pt avoids most salty food by choosing fresh/frozen vegetables frequently. Pt states she "rarely uses canned food."  Pt adds some salt to food during cooking.  The role of sodium in lung disease reviewed with pt.  Pt is diabetic.  Last A1c indicates CBG's well-controlled. Pt does not check her CBG's due to not being able to afford strips. Per nutrition screen, pt reports food insecurity. Pt confirms she is having to choose between food and medication. Per discussion, pt did not get her RX for ProAir, Symbicort, or generic Zoloft due to "I couldn't afford it because I just paid my bills and I don't have any money left." Pt states her ProAir and Symbicort were $116 each. Pt reports she makes too much money for food stamps and not enough money to meet her medical needs. Pt given a handout re: food insecurity and community resources. Cathie Olden, RN notified of the above. Per pt, she needs help grocery shopping and cooking, but she does not have anyone that helps her. Pt manages grocery shopping by asking the [employer] staff for help.  Nutrition Diagnosis   Food-and nutrition-related knowledge deficit related to lack of exposure to information as related to diagnosis of pulmonary disease   Obesity related to excessive energy intake as evidenced by a BMI of 34.2   Food insecurity as evidenced by pt reporting having to choose between buying food or medications. Nutrition Rx/Est. Daily Nutrition Needs for: ? wt loss 1200-1600 Kcal 75-85 gm protein   1500 mg or less sodium     150-175 gm CHO Nutrition Intervention   Pt's individual nutrition plan and goals reviewed with pt.   Benefits of adopting healthy eating habits discussed when pt's Rate  Your Plate reviewed.   Handouts given for: Food Insecurity   Will contact Dr. Marchelle Gearing re: pt issues with Pulmonary meds   Pt to attend the Nutrition and Lung Disease class   Continual client-centered nutrition education by RD, as part of interdisciplinary care. Goal(s) 1. Pt to identify and access community resources to obtain food and medications. 2. Use pre-/post meal and/or exercise CBG's and A1c to determine whether adjustments in food/meal planning will be beneficial or if any meds need to be combined with nutrition therapy. Monitor and Evaluate progress toward nutrition goal with team.    Pulmonary Rehab Nutrition Screen  [facility] 66 y.o. female             Ht: 63.5" Ht Readings from Last 1 Encounters:  11/04/11 5\' 3"  (1.6 m)    Wt:   195.6 lb (88.9 kg) Wt Readings from Last 3 Encounters:  11/04/11 193 lb 12.8 oz (87.907 kg)  09/30/11 189 lb (85.73 kg)  09/10/11 189 lb (85.73 kg)    BMI: 34.2  34.2%body fat                       Rate Your Plate Score: 54  Please answer the following questions:             YES  NO    Do you live in a nursing home?  X   Do you eat out more than 3 times  per week?    X If yes, how many times per week do you eat out?   Do you have food allergies?   X If yes, what are you allergic to?  Have you gained or lost more than 10 lbs without trying?               X If yes, how much weight have you  lost or gained?  lbs over  weeks/mo   Do you want to lose weight?    X  If yes, what is a goal weight or amount of weight you would like to lose? lbs  Do you eat alone most of the time?  X    Do you eat less than 2 meals/day?  X If yes, how many meals do you eat?  Do you use canned and convenience food? X    Do you use a salt shaker? X    Do you drink more than 3 alcoholic drinks/day?  X If yes, how many drinks per day?  Are you having trouble with constipation? *  X If yes, what are you doing to help relieve constipation?  Do you have financial  difficulties with buying food? * X    Do you usually need help with grocery shopping or with cooking? * X    Do you have a poor appetite? *                                       X   Do you have trouble chewing/ swallowing? *   X   Do you take vitamin and mineral or herbal supplements? * X  If yes, what kind of supplements do you currently take?    Past Medical History  Diagnosis Date  . Asthma   . COPD (chronic obstructive pulmonary disease)   . Hypertension   . Diabetes mellitus 09/13/2010    glucophage  . Gout   . Oxygen dependent     pt uses O2   . Pericardial effusion 08/2011    small residual on echo  . Hyperlipidemia    Labs Lipids Lipid Panel  No results found for this basename: chol, trig, hdl, cholhdl, vldl, ldlcalc   Lab Results  Component Value Date   HGBA1C 6.8* 03/16/2011    Nutrition Risk Level:  High

## 2011-11-28 ENCOUNTER — Encounter (HOSPITAL_COMMUNITY)
Admission: RE | Admit: 2011-11-28 | Discharge: 2011-11-28 | Disposition: A | Discharge status: Home / Self Care | Payer: Medicare Other | Source: Ambulatory Visit | Attending: Internal Medicine | Admitting: Internal Medicine

## 2011-11-28 NOTE — Progress Notes (Signed)
Completed home exercise with patient. Reviewed exercise progression, routine, exercising at a comfortable pace, RPE/Dyspnea scales, how important it is to own a pulse oximeter and how to use one, weather conditions, warning signs and symptoms with exercise, and CP/NTG. We discussed when to call MD. Patient voices understanding. Patient has a goal to breathe better while doing ADLs and walk further distances now that she has a Museum/gallery exhibitions officer (RW) . Will continue to encourage and support.  Selden Noteboom L. Manson Passey, MS, NASM, CES

## 2011-11-29 ENCOUNTER — Telehealth: Payer: Self-pay | Admitting: Internal Medicine

## 2011-11-29 NOTE — Telephone Encounter (Signed)
Got message from dietician Mickle Plumb at [facility] rehab that patient unable to afford spiriv and symbicot. Pls give appt with TP to do med calendar and discuss cost and possibly change to nebs alb + atrovent

## 2011-11-29 NOTE — Telephone Encounter (Signed)
[email or web address].Agamjot Kilgallon, CMA  

## 2011-12-03 ENCOUNTER — Encounter (HOSPITAL_COMMUNITY)
Admission: RE | Admit: 2011-12-03 | Discharge: 2011-12-03 | Disposition: A | Discharge status: Home / Self Care | Payer: Medicare Other | Source: Ambulatory Visit | Attending: Internal Medicine | Admitting: Internal Medicine

## 2011-12-04 ENCOUNTER — Telehealth: Payer: Self-pay | Admitting: Internal Medicine

## 2011-12-04 DIAGNOSIS — J449 Chronic obstructive pulmonary disease, unspecified: Secondary | ICD-10-CM

## 2011-12-04 NOTE — Telephone Encounter (Signed)
Pt states the rep to talk to at [facility] is Pennie Rushing and he can be reached at [phone], and he will be faxing some papers over for [name]

## 2011-12-04 NOTE — Telephone Encounter (Signed)
That is fine to change  

## 2011-12-04 NOTE — Telephone Encounter (Signed)
MR please advise-pt would like to change DME companies for her o2

## 2011-12-05 ENCOUNTER — Encounter (HOSPITAL_COMMUNITY)
Admission: RE | Admit: 2011-12-05 | Discharge: 2011-12-05 | Disposition: A | Discharge status: Home / Self Care | Payer: Medicare Other | Source: Ambulatory Visit | Attending: Internal Medicine | Admitting: Internal Medicine

## 2011-12-05 NOTE — Telephone Encounter (Signed)
Order has been sent. Please advise PCC, thanks

## 2011-12-05 NOTE — Telephone Encounter (Signed)
alida faxed order to [facility] [employer] [employer]

## 2011-12-10 ENCOUNTER — Encounter (HOSPITAL_COMMUNITY)
Admission: RE | Admit: 2011-12-10 | Discharge: 2011-12-10 | Disposition: A | Discharge status: Home / Self Care | Payer: Medicare Other | Source: Ambulatory Visit | Attending: Internal Medicine | Admitting: Internal Medicine

## 2011-12-10 NOTE — Telephone Encounter (Signed)
[email or web address]. Jacub Waiters, CMA  

## 2011-12-10 NOTE — Telephone Encounter (Signed)
Pt aware and appt set with TP on 12-18-11. Carron Curie, CMA

## 2011-12-12 ENCOUNTER — Encounter (HOSPITAL_COMMUNITY)
Admission: RE | Admit: 2011-12-12 | Discharge: 2011-12-12 | Disposition: A | Discharge status: Home / Self Care | Payer: Medicare Other | Source: Ambulatory Visit | Attending: Internal Medicine | Admitting: Internal Medicine

## 2011-12-12 ENCOUNTER — Encounter (HOSPITAL_COMMUNITY): Payer: Medicare Other

## 2011-12-17 ENCOUNTER — Encounter (HOSPITAL_COMMUNITY)
Admission: RE | Admit: 2011-12-17 | Discharge: 2011-12-17 | Disposition: A | Discharge status: Home / Self Care | Payer: Medicare Other | Source: Ambulatory Visit | Attending: Internal Medicine | Admitting: Internal Medicine

## 2011-12-17 DIAGNOSIS — J4489 Other specified chronic obstructive pulmonary disease: Secondary | ICD-10-CM | POA: Insufficient documentation

## 2011-12-17 DIAGNOSIS — Z5189 Encounter for other specified aftercare: Secondary | ICD-10-CM | POA: Insufficient documentation

## 2011-12-17 DIAGNOSIS — E785 Hyperlipidemia, unspecified: Secondary | ICD-10-CM | POA: Insufficient documentation

## 2011-12-17 DIAGNOSIS — J449 Chronic obstructive pulmonary disease, unspecified: Secondary | ICD-10-CM | POA: Insufficient documentation

## 2011-12-17 DIAGNOSIS — I10 Essential (primary) hypertension: Secondary | ICD-10-CM | POA: Insufficient documentation

## 2011-12-18 ENCOUNTER — Ambulatory Visit (INDEPENDENT_AMBULATORY_CARE_PROVIDER_SITE_OTHER): Payer: Medicare Other | Admitting: Adult Health

## 2011-12-18 ENCOUNTER — Encounter: Payer: Self-pay | Admitting: Adult Health

## 2011-12-18 VITALS — BP 126/72 | HR 102 | Temp 97.1°F | Ht 63.0 in | Wt 199.2 lb

## 2011-12-18 DIAGNOSIS — J4489 Other specified chronic obstructive pulmonary disease: Secondary | ICD-10-CM

## 2011-12-18 DIAGNOSIS — J449 Chronic obstructive pulmonary disease, unspecified: Secondary | ICD-10-CM

## 2011-12-18 NOTE — Patient Instructions (Addendum)
Follow med calendar closely and bring to each visit.  follow up Dr. Marchelle Gearing in 2 months and As needed

## 2011-12-18 NOTE — Progress Notes (Signed)
Subjective:    Patient ID: [name], female    DOB: May 30, 1946, 66 y.o.   MRN: [id number]  HPI    Review of Systems     Objective:   Physical Exam        Assessment & Plan:   Subjective:    Patient ID: [name], female    DOB: 1945-10-02, 66 y.o.   MRN: [id number]  HPI 66 yo female with known hx of  1. Severe/ very severe  COPD -O2 depenedent (advair causes epistaxis)   - Spirometry Nov 2012: Fev1 0.85L/45%, Ratio - CAT score 10 - May 2013 2. Recurrent AECOPD -   - Oct 2011, JAn 2012, March 2012 and August 2012.  3. Small but increasing pericardial effusion on CT jan and march 2012. Moderate Rt atrial collapse in August 2012 - Dr Elease Hashimoto since Aug 2012 4. Isolated social situation.  5. Ex smoker  6.hx of snoring and excess daytime somnolence but not using cpap; no dx of OSA.  7. Body mass index is 33.98 kg/(m^2). - on 08/01/2011  - Body mass index is 34.33 kg/(m^2). - 11/04/2011       OV 02/27/11: Followup for above issues. Last seen by NP 1 months ago for med calendar folliwng her aecopd admit month ago. Currently states dyspnea is stable class 3. No change in cough and sputum. She wants a scooter for increased mobility. In talking to her she lives alone without friends or family. She is doing ADLs and groceries all by herself. States walking from parking lot to fron t entrance of [employer] makes her severely dyspneic and unable to proceed further. She wishes she had new lungs but realizes and does not want option of transplant. She desires improved quality of life and improved dyspnea and able to attend church which she stopped several years ago  REC We will do prescription for scooter or check with pmd if he/she can do  You could consider a home hospice program that could benefit your quality of life, shortness of breath, symptoms  Congrats on flu shot  Return in 8 weeks to report progress  Have spirometry on return  OV 04/19/11: Followup COPD. No new  prblems. Living at home. Stable health. Good days and bad days. Gets dyspneic walking 10 feet. Not much cough. Some white very little sputum 1-2 days. Denies chst pain. Has not pursued issue of scooter with PMD. She wishes for scooter to improve mobility. Spirometry today: Fev1 0.85L/45%, Ratio   No change in past, family, social hx: admits mother and sister have copd  Your copd is severe stage 3 but not very severe stage 4 as I originally thought  I think you are deconditioned and this is contributing to your symptoms  So, I do not think scooter is the answer at this point  I would like for you to attend pulmonary rehab at [place]; nurse will do referral  Check blood for alpha 1 genetic test for copd  Return in 2 and 1/2 month for copd followup  Continue your medications as before  Note: if your current sputum or cough or shortness of breath gets worse, call us immediately       OV 08/01/2011 Followup COPD. Still living alone. Reports compliance with o2 and medications. Still not attended rehab; says never got called. Diagnosed iwht panic disoreder in interim and on zyprexa and she reports significant improvement in dyspnea, and mood since then. OVerall well. No specific complaints. No weight loss. No  AECOPD symptoms. Has had flu shot  Past, Family, Social reviewed: no change since last visit other than diagnosis of panic   Your severe copd is currently stable  Continue spiriva, symbicort and oxygen  Nurse will re-refer you to pulmonary rehab - do not know why they did not call  Glad you had flu shot  Will check alpha 1 today - genetic test for copd  REturn in 3 months  Call anytime if you feel symptoms are getting worse or any change  At followup do spirometry   OV 11/04/2011 Followup COPD Feels better than ever before. fEels Baseline health. COPD symptom CAt score 10 and reflects low symptom burden despite severity of disease. Due to start rehab tomorrow. Compliant with meds.  Want to lose weight  CAT COPD Symptom and Quality of Life Score (glax[employer] trademark)  0 (no burden) to 5 (highest burden)  Never Cough -> Cough all the time 0  No phlegm in chest -> Chest is full of phlegm 0  No chest tightness -> Chest feels very tight 3  No dyspnea for 1 flight stairs/hill -> Very dyspneic for 1 flight of stairs 5  No limitations for ADL at home -> Very limited with ADL at home 0  Confident leaving home -> Not at all confident leaving home 0  Sleep soundly -> Do not sleep soundly because of lung condition 0  Lots of Energy -> No energy at all 2  TOTAL Score (max 40)  10   12/18/2011 Follow up and med review  Returns for follow up and med review.  We reviewed all his meds and organized them into a med calendar w/ pt education  Unfortuanately she did not bring in any inhalers of her.  Today Cat score 13 12/18/2011  No chest pain or edema  Feels okay , at her baseline.    Review of Systems  Constitutional:   No  weight loss, night sweats,  Fevers, chills,  +fatigue, or  lassitude.  HEENT:   No headaches,  Difficulty swallowing,  Tooth/dental problems, or  Sore throat,                No sneezing, itching, ear ache, nasal congestion, post nasal drip,   CV:  No chest pain,  Orthopnea, PND, swelling in lower extremities, anasarca, dizziness, palpitations, syncope.   GI  No heartburn, indigestion, abdominal pain, nausea, vomiting, diarrhea, change in bowel habits, loss of appetite, bloody stools.   Resp:    No coughing up of blood.    No chest wall deformity  Skin: no rash or lesions.  GU: no dysuria, change in color of urine, no urgency or frequency.  No flank pain, no hematuria   MS:  No joint pain or swelling.  No decreased range of motion.    Psych:  No change in mood or affect. No depression or anxiety.  No memory loss.           Objective:   Physical Exam  GEN: A/Ox3; pleasant , NAD, chronically ill appearing.   HEENT:  [place]/AT,  EACs-clear,  TMs-wnl, NOSE-clear, THROAT-clear, no lesions, no postnasal drip or exudate noted.   NECK:  Supple w/ fair ROM; no JVD; normal carotid impulses w/o bruits; no thyromegaly or nodules palpated; no lymphadenopathy.  RESP  Coarse BS w/ no wheezing . no accessory muscle use, no dullness to percussion  CARD:  RRR, no m/r/g  , no peripheral edema, pulses intact, no cyanosis or clubbing.  GI:   Soft & nt; nml bowel sounds; no organomegaly or masses detected.  Musco: Warm bil, no deformities or joint swelling noted.   Neuro: alert, no focal deficits noted.    Skin: Warm, no lesions or rashes         Assessment & Plan:

## 2011-12-19 ENCOUNTER — Encounter (HOSPITAL_COMMUNITY): Payer: Medicare Other

## 2011-12-24 ENCOUNTER — Encounter (HOSPITAL_COMMUNITY)
Admission: RE | Admit: 2011-12-24 | Discharge: 2011-12-24 | Disposition: A | Discharge status: Home / Self Care | Payer: Medicare Other | Source: Ambulatory Visit | Attending: Internal Medicine | Admitting: Internal Medicine

## 2011-12-24 NOTE — Progress Notes (Signed)
Nutrition Note Spoke with pt. Pt states "they are always getting on me because by blood pressure is high." Per discussion with pt, pt aware she needs to decrease the sodium in her diet to help with her blood pressure.  Pt reports she eats convenience foods "because I have COPD and I don't like to cook anymore." Pt was unaware that convenience foods were high in sodium. Pt uses season-all, salt, garlic salt, onion salt, and Adobo to season food. Pt also consumes high sodium meats (e.g. ham hock and sausage). Lower sodium options and ways to decrease sodium in her diet discussed. Pt given a low-sodium diet handout. Pt expressed understanding of information reviewed. Continue client-centered nutrition education by RD as part of interdisciplinary care.  Monitor and evaluate progress toward nutrition goal with team.

## 2011-12-26 ENCOUNTER — Encounter (HOSPITAL_COMMUNITY)
Admission: RE | Admit: 2011-12-26 | Discharge: 2011-12-26 | Disposition: A | Discharge status: Home / Self Care | Payer: Medicare Other | Source: Ambulatory Visit | Attending: Adult Health | Admitting: Adult Health

## 2011-12-31 ENCOUNTER — Encounter (HOSPITAL_COMMUNITY)
Admission: RE | Admit: 2011-12-31 | Discharge: 2011-12-31 | Disposition: A | Discharge status: Home / Self Care | Payer: Medicare Other | Source: Ambulatory Visit | Attending: Internal Medicine | Admitting: Internal Medicine

## 2012-01-02 ENCOUNTER — Encounter (HOSPITAL_COMMUNITY)
Admission: RE | Admit: 2012-01-02 | Discharge: 2012-01-02 | Disposition: A | Discharge status: Home / Self Care | Payer: Medicare Other | Source: Ambulatory Visit | Attending: Internal Medicine | Admitting: Internal Medicine

## 2012-01-07 ENCOUNTER — Encounter (HOSPITAL_COMMUNITY): Payer: Medicare Other

## 2012-01-09 ENCOUNTER — Encounter (HOSPITAL_COMMUNITY)
Admission: RE | Admit: 2012-01-09 | Discharge: 2012-01-09 | Disposition: A | Discharge status: Home / Self Care | Payer: Medicare Other | Source: Ambulatory Visit | Attending: Internal Medicine | Admitting: Internal Medicine

## 2012-01-14 ENCOUNTER — Encounter (HOSPITAL_COMMUNITY)
Admission: RE | Admit: 2012-01-14 | Discharge: 2012-01-14 | Disposition: A | Discharge status: Home / Self Care | Payer: Medicare Other | Source: Ambulatory Visit | Attending: Internal Medicine | Admitting: Internal Medicine

## 2012-01-16 ENCOUNTER — Encounter (HOSPITAL_COMMUNITY)
Admission: RE | Admit: 2012-01-16 | Discharge: 2012-01-16 | Disposition: A | Discharge status: Home / Self Care | Payer: Medicare Other | Source: Ambulatory Visit | Attending: Internal Medicine | Admitting: Internal Medicine

## 2012-01-16 DIAGNOSIS — J4489 Other specified chronic obstructive pulmonary disease: Secondary | ICD-10-CM | POA: Insufficient documentation

## 2012-01-16 DIAGNOSIS — J449 Chronic obstructive pulmonary disease, unspecified: Secondary | ICD-10-CM | POA: Insufficient documentation

## 2012-01-16 DIAGNOSIS — Z5189 Encounter for other specified aftercare: Secondary | ICD-10-CM | POA: Insufficient documentation

## 2012-01-16 DIAGNOSIS — E785 Hyperlipidemia, unspecified: Secondary | ICD-10-CM | POA: Insufficient documentation

## 2012-01-16 DIAGNOSIS — I10 Essential (primary) hypertension: Secondary | ICD-10-CM | POA: Insufficient documentation

## 2012-01-21 ENCOUNTER — Encounter (HOSPITAL_COMMUNITY)
Admission: RE | Admit: 2012-01-21 | Discharge: 2012-01-21 | Disposition: A | Discharge status: Home / Self Care | Payer: Medicare Other | Source: Ambulatory Visit

## 2012-01-23 ENCOUNTER — Encounter (HOSPITAL_COMMUNITY)
Admission: RE | Admit: 2012-01-23 | Discharge: 2012-01-23 | Disposition: A | Discharge status: Home / Self Care | Payer: Medicare Other | Source: Ambulatory Visit | Attending: Internal Medicine | Admitting: Internal Medicine

## 2012-01-28 ENCOUNTER — Encounter (HOSPITAL_COMMUNITY)
Admission: RE | Admit: 2012-01-28 | Discharge: 2012-01-28 | Disposition: A | Discharge status: Home / Self Care | Payer: Medicare Other | Source: Ambulatory Visit | Attending: Internal Medicine | Admitting: Internal Medicine

## 2012-01-30 ENCOUNTER — Encounter (HOSPITAL_COMMUNITY)
Admission: RE | Admit: 2012-01-30 | Discharge: 2012-01-30 | Disposition: A | Discharge status: Home / Self Care | Payer: Medicare Other | Source: Ambulatory Visit | Attending: Internal Medicine | Admitting: Internal Medicine

## 2012-02-03 NOTE — Progress Notes (Signed)
Pulmonary Rehabilitation Program Outcomes Report Orientation:  10/24/2011  Discharge Date:  01/30/2012  # of sessions completed: 24  Pulmonologist: Ramaswamy Class Time:  1:30pm  A.  Exercise Program:  Tolerates exercise @ 2.76 METS for 45 minutes, Walk Test Results:  Pre: 933ft and Post: 1264ft, Improved functional capacity  26.32 %, Improved  muscular strength  5.63 %, Improved dyspnea score 38.46 %, Improved education score 55.56 %, Exercise limited by dyspnea, Needs encouragement on exercise program, Discharged to home exercise program.  Anticipated compliance:  good and Discharged  B.  Mental Health:  Good mental attitude, Health related anxiety and Quality of Life (QOL)  changes:  Overall  -2.51 %, Health/Functioning -9.68 %, Socioeconomics -12.28 %, Psych/Spiritual -4.12 %, Family +82.60 %    C.  Education/Instruction/Skills  Attended10 education classes  Demonstrates accurate diaphragmatic breathing and pursed lip breathing. Home exercise given:11/28/2011  D.  Nutrition/Weight Control/Body Composition:  There is some room for improvement for pt diet to become healthier.  Pt wt is up 2.1 kg during rehab. BMI 35.0 Section Completed by: Mickle Plumb, M.Ed, RD, LDN, CDE    E.  Blood Lipids  No results found for this basename: CHOL, HDL, LDLCALC, LDLDIRECT, TRIG, CHOLHDL   F.  Lifestyle Changes:  Making positive lifestyle changes  G.  Symptoms noted with exercise:  Angina exertional, Questionable angina and Shortness of breath, exertional hypertension, fatigue.   Report Completed By: Frederik Schmidt. Manson Passey, MS, NASM, CES  Comments:  Patient has graduated the pulmonary rehab program. Patient completed all 24 sessions tolerating well. Maintained oxygen sats above 92% on 3L NCC. Vitals stable. Patient accomplished all personal and rehab goals. Patient plans to attend [employer] to continue her home exercise. Patient very satisfied with program and  appreciative for taking the time to spend one on one with her as necessary. Patient will call us back if she would like to join the pulmonary maintenance program. Randie Heinz to work with and very compliant with attendance.  Courtney L. Manson Passey, MS, NASM, CES   Agree with above note.    Cathie Olden RN

## 2012-02-04 ENCOUNTER — Encounter (HOSPITAL_COMMUNITY): Payer: Medicare Other

## 2012-02-06 ENCOUNTER — Encounter (HOSPITAL_COMMUNITY): Payer: Medicare Other

## 2012-02-11 ENCOUNTER — Encounter (HOSPITAL_COMMUNITY): Payer: Medicare Other

## 2012-02-13 ENCOUNTER — Encounter (HOSPITAL_COMMUNITY): Payer: Medicare Other

## 2012-02-18 ENCOUNTER — Encounter (HOSPITAL_COMMUNITY): Payer: Medicare Other

## 2012-02-20 ENCOUNTER — Encounter (HOSPITAL_COMMUNITY): Payer: Medicare Other

## 2012-02-24 ENCOUNTER — Ambulatory Visit: Payer: Medicare Other | Admitting: Internal Medicine

## 2012-02-28 ENCOUNTER — Ambulatory Visit (INDEPENDENT_AMBULATORY_CARE_PROVIDER_SITE_OTHER): Payer: Medicare Other | Admitting: Internal Medicine

## 2012-02-28 ENCOUNTER — Encounter: Payer: Self-pay | Admitting: Internal Medicine

## 2012-02-28 ENCOUNTER — Telehealth: Payer: Self-pay | Admitting: Internal Medicine

## 2012-02-28 VITALS — BP 150/90 | HR 94 | Temp 98.3°F | Ht 62.5 in | Wt 202.4 lb

## 2012-02-28 DIAGNOSIS — J449 Chronic obstructive pulmonary disease, unspecified: Secondary | ICD-10-CM

## 2012-02-28 DIAGNOSIS — Z23 Encounter for immunization: Secondary | ICD-10-CM

## 2012-02-28 MED ORDER — PREDNISONE 10 MG PO TABS: ORAL_TABLET | ORAL | Status: DC

## 2012-02-28 MED ORDER — DOXYCYCLINE HYCLATE 100 MG PO TABS: ORAL_TABLET | ORAL | Status: DC

## 2012-02-28 MED ORDER — ALBUTEROL SULFATE HFA 108 (90 BASE) MCG/ACT IN AERS: 2.0000 | INHALATION_SPRAY | Freq: Four times a day (QID) | RESPIRATORY_TRACT | Status: DC | PRN

## 2012-02-28 NOTE — Patient Instructions (Addendum)
#  COPD  Your severe copd is in the end of a mild flare right now Glad you finished rehab  Continue spiriva, symbicort and oxygen Take doxycycline 100mg po twice daily x 5 days; take after meals and avoid sunlight Take prednisone 40 mg daily x 2 days, then 20mg daily x 2 days, then 10mg daily x 2 days, and stop Call anytime if you feel symptoms are getting worse or any change At followup do CAT SCORE   #immunization  - have flu shot today  - nurse will call [facility], [name], DO office and check status of your pneumonia vaccine   #Followup  3 months or sooner if needed  

## 2012-02-28 NOTE — Progress Notes (Signed)
Subjective:    Patient ID: [name], female    DOB: 1946-03-28, 66 y.o.   MRN: [id number]  HPI  1. Severe COPD -O2 depenedent (advair causes epistaxis)   - Spirometry Nov 2012: Fev1 0.85L/45%, Ratio - CAT score 10 - May 2013 2. Recurrent AECOPD -   - Oct 2011, JAn 2012, March 2012 and August 2012, Sept 2013 3. Small but increasing pericardial effusion on CT jan and march 2012. Moderate Rt atrial collapse in August 2012 - Dr Elease Hashimoto since Aug 2012 4. Isolated social situation.  5. Ex smoker  6.hx of snoring and excess daytime somnolence but not using cpap; no dx of OSA.  7. Body mass index is 33.98 kg/(m^2). - on 08/01/2011  - Body mass index is 34.33 kg/(m^2). - 11/04/2011 - Body mass index is 36.43 kg/(m^2). on 02/28/2012      OV 02/28/2012  Followup COPD. Was doing baseline and well. A weeik or so ago develiped insidious onset of worsening  cough and increase white phlegm; now improving to nearly resolved. Denies change in dyspnea or fever, or colored sputum. CAT score shows doubling of symptoms to scoe 19 (was 10 at last visit). Otherwise well  CAT SCORE 11/04/11 02/28/2012   Never Cough -> Cough all the time 0 3  No phlegm in chest -> Chest is full of phlegm 0 2  No chest tightness -> Chest feels very tight 3 1  No dyspnea for 1 flight stairs/hill -> Very dyspneic for 1 flight of stairs 5 4  No limitations for ADL at home -> Very limited with ADL at home 0 2  Confident leaving home -> Not at all confident leaving home 0 1  Sleep soundly -> Do not sleep soundly because of lung condition 0 3  Lots of Energy -> No energy at all 2 3  TOTAL Score (max 40)  10 19   Past, Family, Social reviewed: no change since last visit   Current outpatient prescriptions:albuterol (PROAIR HFA) 108 (90 BASE) MCG/ACT inhaler, Inhale 2 puffs into the lungs every 4 (four) hours as needed., Disp: , Rfl: ;  albuterol (PROVENTIL) (2.5 MG/3ML) 0.083% nebulizer solution, Take 2.5 mg by nebulization  every 4 (four) hours as needed. For shortness of breath., Disp: , Rfl: ;  aspirin 81 MG tablet, Take 81 mg by mouth daily., Disp: , Rfl:  budesonide-formoterol (SYMBICORT) 80-4.5 MCG/ACT inhaler, Inhale 2 puffs into the lungs 2 (two) times daily.  , Disp: , Rfl: ;  Ferrous Sulfate (IRON) 325 (65 FE) MG TABS, Take 1 tablet by mouth daily.  , Disp: , Rfl: ;  hydrochlorothiazide 25 MG tablet, Take 25 mg by mouth daily.  , Disp: , Rfl: ;  indomethacin (INDOCIN) 25 MG capsule, Take 25 mg by mouth 3 (three) times daily as needed., Disp: , Rfl:  losartan (COZAAR) 100 MG tablet, Take 100 mg by mouth daily.  , Disp: , Rfl: ;  lovastatin (MEVACOR) 40 MG tablet, Take 40 mg by mouth at bedtime.  , Disp: , Rfl: ;  metFORMIN (GLUCOPHAGE) 500 MG tablet, Take 500 mg by mouth 2 (two) times daily with a meal., Disp: , Rfl: ;  Multiple Vitamins-Minerals (MULTIPLE VITAMINS/WOMENS PO), Take 1 tablet by mouth daily., Disp: , Rfl:  OXYGEN-HELIUM IN, Inhale 2 L/min into the lungs daily. 24/7, Disp: , Rfl: ;  pantoprazole (PROTONIX) 40 MG tablet, Take 40 mg by mouth daily. , Disp: , Rfl: ;  tiotropium (SPIRIVA) 18 MCG inhalation capsule, Place 18  mcg into inhaler and inhale daily. , Disp: , Rfl: ;  ALPRAZolam (XANAX) 0.25 MG tablet, Take 0.25 mg by mouth as needed. For panic attacks., Disp: , Rfl: ;  sertraline (ZOLOFT) 25 MG tablet, Take 25 mg by mouth daily. , Disp: , Rfl:  DISCONTD: albuterol-ipratropium (COMBIVENT) 18-103 MCG/ACT inhaler, Inhale 2 puffs into the lungs every 6 (six) hours as needed. For shortness of breath., Disp: , Rfl:    Review of Systems  Constitutional: Negative for fever, chills, diaphoresis, activity change, appetite change, fatigue and unexpected weight change.  HENT: Positive for congestion and postnasal drip. Negative for hearing loss, ear pain, nosebleeds, sore throat, facial swelling, rhinorrhea, sneezing, mouth sores, trouble swallowing, neck pain, neck stiffness, dental problem, voice change, sinus  pressure, tinnitus and ear discharge.   Eyes: Negative for photophobia, discharge, itching and visual disturbance.  Respiratory: Positive for cough and shortness of breath. Negative for apnea, choking, chest tightness, wheezing and stridor.        Pt using flutter valve for mucus in chest  Cardiovascular: Negative for chest pain, palpitations and leg swelling.  Gastrointestinal: Negative for nausea, vomiting, abdominal pain, constipation, blood in stool and abdominal distention.  Genitourinary: Negative for dysuria, urgency, frequency, hematuria, flank pain, decreased urine volume and difficulty urinating.  Musculoskeletal: Negative for myalgias, back pain, joint swelling, arthralgias and gait problem.  Skin: Negative for color change, pallor and rash.  Neurological: Negative for dizziness, tremors, seizures, syncope, speech difficulty, weakness, light-headedness, numbness and headaches.  Hematological: Negative for adenopathy. Does not bruise/bleed easily.  Psychiatric/Behavioral: Negative for confusion, disturbed wake/sleep cycle and agitation. The patient is nervous/anxious.        Objective:   Physical Exam  EN: A/Ox3; pleasant , NAD, chronically ill appearing.   HEENT:  [place]/AT,  EACs-clear, TMs-wnl, NOSE-clear, THROAT-clear, no lesions, no postnasal drip or exudate noted.   NECK:  Supple w/ fair ROM; no JVD; normal carotid impulses w/o bruits; no thyromegaly or nodules palpated; no lymphadenopathy.  RESP  Coarse BS w/ no wheezing . no accessory muscle use, no dullness to percussion  CARD:  RRR, no m/r/g  , 1+ peripheral edema, pulses intact, no cyanosis or clubbing.  GI:   Soft & nt; nml bowel sounds; no organomegaly or masses detected.  Musco: Warm bil, no deformities or joint swelling noted.   Neuro: alert, no focal deficits noted.    Skin: Warm, no lesions or rashes           Assessment & Plan:

## 2012-02-28 NOTE — Telephone Encounter (Signed)
  Please call REED, [name], DO and check status of pneumovax

## 2012-02-28 NOTE — Assessment & Plan Note (Signed)
#  COPD  Your severe copd is in the end of a mild flare right now Glad you finished rehab  Continue spiriva, symbicort and oxygen Take doxycycline 100mg  po twice daily x 5 days; take after meals and avoid sunlight Take prednisone 40 mg daily x 2 days, then 20mg  daily x 2 days, then 10mg  daily x 2 days, and stop Call anytime if you feel symptoms are getting worse or any change At followup do CAT SCORE   #immunization  - have flu shot today  - nurse will call REED, [name], DO office and check status of your pneumonia vaccine   #Followup  3 months or sooner if needed

## 2012-03-05 NOTE — Telephone Encounter (Signed)
I spoke with Dr. Ernest Mallick nurse and she states there records do not show the pt ever having Pneumovax. Carron Curie, CMA

## 2012-03-05 NOTE — Telephone Encounter (Signed)
Please tell her to come by and get it here or at PMD office. Otherwise, We will give it at fu

## 2012-03-05 NOTE — Telephone Encounter (Signed)
[id number].Jennifer Castillo, CMA  

## 2012-03-11 NOTE — Telephone Encounter (Signed)
[id number]. [name], CMA  

## 2012-03-24 NOTE — Telephone Encounter (Signed)
Pt aware. Jennifer Castillo, CMA  

## 2012-04-28 ENCOUNTER — Telehealth: Payer: Self-pay | Admitting: Internal Medicine

## 2012-04-28 NOTE — Telephone Encounter (Signed)
Called pt and left a message on machine x 3 to make next ov per recall.  Sent reminder letter on 04/28/12. Emily E McAlister  °

## 2012-09-24 ENCOUNTER — Encounter: Payer: Self-pay | Admitting: Geriatric Medicine

## 2012-09-24 ENCOUNTER — Ambulatory Visit (INDEPENDENT_AMBULATORY_CARE_PROVIDER_SITE_OTHER): Payer: Medicare Other | Admitting: Internal Medicine

## 2012-09-24 ENCOUNTER — Encounter: Payer: Self-pay | Admitting: Internal Medicine

## 2012-09-24 VITALS — BP 162/88 | HR 85 | Temp 98.6°F | Resp 16 | Ht 62.0 in | Wt 193.6 lb

## 2012-09-24 DIAGNOSIS — E669 Obesity, unspecified: Secondary | ICD-10-CM

## 2012-09-24 DIAGNOSIS — Z860100 Personal history of colon polyps, unspecified: Secondary | ICD-10-CM

## 2012-09-24 DIAGNOSIS — J449 Chronic obstructive pulmonary disease, unspecified: Secondary | ICD-10-CM

## 2012-09-24 DIAGNOSIS — G4700 Insomnia, unspecified: Secondary | ICD-10-CM

## 2012-09-24 DIAGNOSIS — D509 Iron deficiency anemia, unspecified: Secondary | ICD-10-CM

## 2012-09-24 DIAGNOSIS — J4489 Other specified chronic obstructive pulmonary disease: Secondary | ICD-10-CM

## 2012-09-24 DIAGNOSIS — E782 Mixed hyperlipidemia: Secondary | ICD-10-CM

## 2012-09-24 DIAGNOSIS — IMO0002 Reserved for concepts with insufficient information to code with codable children: Secondary | ICD-10-CM | POA: Insufficient documentation

## 2012-09-24 DIAGNOSIS — E118 Type 2 diabetes mellitus with unspecified complications: Secondary | ICD-10-CM

## 2012-09-24 DIAGNOSIS — E1165 Type 2 diabetes mellitus with hyperglycemia: Secondary | ICD-10-CM

## 2012-09-24 DIAGNOSIS — I10 Essential (primary) hypertension: Secondary | ICD-10-CM | POA: Insufficient documentation

## 2012-09-24 MED ORDER — LOSARTAN POTASSIUM 100 MG PO TABS: 100.0000 mg | ORAL_TABLET | Freq: Every day | ORAL | Status: DC

## 2012-09-24 MED ORDER — BUDESONIDE-FORMOTEROL FUMARATE 80-4.5 MCG/ACT IN AERO: 2.0000 | INHALATION_SPRAY | Freq: Two times a day (BID) | RESPIRATORY_TRACT | Status: DC

## 2012-09-24 NOTE — Progress Notes (Signed)
Patient ID: [name], female   DOB: Apr 20, 1946, 67 y.o.   MRN: [id number] Code Status: need to readdress with her at this point  Allergies  Allergen Reactions  . Lisinopril Swelling    Angioedema   . Advair Diskus (Fluticasone-Salmeterol) Other (See Comments)    Nose bleeds.   . Penicillins     hives    Chief Complaint  Patient presents with  . Medical Managment of Chronic Issues    HPI: Patient is a 67 y.o. AA female seen in the office today for management of her chronic medical problems  Still concerned about visceral fat on abdomen--feels like sides will pop after she eats a lot  Has itchy moles that drive her mad.    Is feeling good.  Has not been in hospital for a year.  Still has her chronic shortness of breath.  Questions her memory.  Thought her appt was at 10am.  Got her card at home and saw it was at 130pm.  Says she does not have a calendar.    Is not exercising...would like to be in an exercise group.  Went to [facility] and saw it was a senior center--would like to go for exercise, but too expensive.  Needs hot water heater paid for at this point.  Has not been faithfully doing her exercises she learned at [facility] rehab.    Symbicort is too expensive but has been helping her.  She believes she previously got help from the drug company on it.  We found some online coupons for it.    Taking a seven flower chinese herb for blood pressure (bp high today) and taking another herbal laxative.  Does not check her bp at home.  It was not at goal today and did take her medications and the herbal supplement.      Review of Systems:  Review of Systems  Constitutional: Negative for fever, chills and weight loss.  HENT: Negative for hearing loss and congestion.   Eyes: Negative for blurred vision.  Respiratory: Positive for cough, sputum production, shortness of breath and wheezing.   Cardiovascular: Positive for orthopnea. Negative for chest pain,  palpitations, leg swelling and PND.  Gastrointestinal: Negative for heartburn, abdominal pain and constipation.  Genitourinary: Negative for dysuria.  Musculoskeletal: Negative for myalgias and falls.  Skin: Negative for rash.  Neurological: Negative for dizziness and focal weakness.  Endo/Heme/Allergies: Does not bruise/bleed easily.  Psychiatric/Behavioral: Positive for memory loss. Negative for depression. The patient is nervous/anxious. The patient does not have insomnia.        Mood improved, but has financial difficulties     Past Medical History  Diagnosis Date  . Asthma   . COPD (chronic obstructive pulmonary disease)   . Hypertension   . Diabetes mellitus 09/13/2010    glucophage  . Gout   . Oxygen dependent     pt uses O2   . Pericardial effusion 08/2011    small residual on echo  . Hyperlipidemia   . Type II or unspecified type diabetes mellitus with neurological manifestations, uncontrolled(250.62)   . Personal history of noncompliance with medical treatment, presenting hazards to health   . Unspecified disease of pericardium   . Hemorrhage of rectum and anus   . Panic disorder without agoraphobia   . Depressive disorder, not elsewhere classified   . Edema   . Anxiety state, unspecified   . Unspecified hereditary and idiopathic peripheral neuropathy   . Type II or unspecified  type diabetes mellitus without mention of complication, not stated as uncontrolled   . Other and unspecified hyperlipidemia   . Obesity, unspecified   . Unspecified tinnitus   . Unspecified essential hypertension   . Pneumonia, organism unspecified   . Chronic obstructive asthma, unspecified   . Acute and chronic respiratory failure   . Urinary frequency   . Urgency of urination    Past Surgical History  Procedure Laterality Date  . Breast biopsy      both breasts  . Total abdominal hysterectomy     Social History:   reports that she quit smoking about 8 years ago. Her smoking use  included Cigarettes. She has a 30 pack-year smoking history. She has never used smokeless tobacco. She reports that she drinks about 1.0 ounces of alcohol per week. She reports that she does not use illicit drugs.  Family History  Problem Relation Age of Onset  . COPD Mother   . COPD Sister   . Cancer Father   . Asthma Father   . Diabetes Other     3 maternal aunts  . Prostate cancer Paternal Uncle     Medications: Patient's Medications  New Prescriptions   LOSARTAN (COZAAR) 100 MG TABLET    Take 1 tablet (100 mg total) by mouth daily.  Previous Medications   ALBUTEROL (PROAIR HFA) 108 (90 BASE) MCG/ACT INHALER    Inhale 2 puffs into the lungs every 4 (four) hours as needed.   ALBUTEROL (PROVENTIL) (2.5 MG/3ML) 0.083% NEBULIZER SOLUTION    Take 2.5 mg by nebulization every 4 (four) hours as needed. For shortness of breath.   ASPIRIN 81 MG TABLET    Take 81 mg by mouth daily.   FERROUS SULFATE (IRON) 325 (65 FE) MG TABS    Take 1 tablet by mouth daily.     HYDROCHLOROTHIAZIDE 25 MG TABLET    Take 25 mg by mouth daily.     INDOMETHACIN (INDOCIN) 25 MG CAPSULE    Take 25 mg by mouth 3 (three) times daily as needed.   LOVASTATIN (MEVACOR) 40 MG TABLET    Take 40 mg by mouth at bedtime.     METFORMIN (GLUCOPHAGE) 500 MG TABLET    Take 500 mg by mouth 2 (two) times daily with a meal.   MULTIPLE VITAMINS-MINERALS (MULTIPLE VITAMINS/WOMENS PO)    Take 1 tablet by mouth daily.   OXYGEN-HELIUM IN    Inhale 2 L/min into the lungs daily. 24/7   SERTRALINE (ZOLOFT) 25 MG TABLET    Take 25 mg by mouth daily.   Modified Medications   Modified Medication Previous Medication   BUDESONIDE-FORMOTEROL (SYMBICORT) 80-4.5 MCG/ACT INHALER budesonide-formoterol (SYMBICORT) 80-4.5 MCG/ACT inhaler      Inhale 2 puffs into the lungs 2 (two) times daily.    Inhale 2 puffs into the lungs 2 (two) times daily.    Discontinued Medications   ALBUTEROL (PROVENTIL HFA;VENTOLIN HFA) 108 (90 BASE) MCG/ACT INHALER     Inhale 2 puffs into the lungs every 6 (six) hours as needed for wheezing.   ALPRAZOLAM (XANAX) 0.25 MG TABLET    Take 0.25 mg by mouth as needed. For panic attacks.   DOXYCYCLINE (VIBRA-TABS) 100 MG TABLET    X 5days-- take after meals and avoid sunlight   LOSARTAN (COZAAR) 100 MG TABLET    Take 100 mg by mouth daily.     PANTOPRAZOLE (PROTONIX) 40 MG TABLET    Take 40 mg by mouth daily.  PREDNISONE (DELTASONE) 10 MG TABLET    Take 40mg  x2days, then 20mg  x2days, then 10mg  x2days and STOP   TIOTROPIUM (SPIRIVA) 18 MCG INHALATION CAPSULE    Place 18 mcg into inhaler and inhale daily.      Physical Exam: Filed Vitals:   09/24/12 1329  BP: 162/88  Pulse: 85  Temp: 98.6 F (37 C)  TempSrc: Oral  Resp: 16  Height: 5\' 2"  (1.575 m)  Weight: 193 lb 9.6 oz (87.816 kg)  Physical Exam  Constitutional: She is oriented to person, place, and time.  Obese AA female on chronic O2 via [place], appears distressed when walking in from waiting room to exam room  HENT:  Head: Normocephalic and atraumatic.  Eyes: EOM are normal. Pupils are equal, round, and reactive to light.  Neck: Neck supple. No JVD present.  Cardiovascular: Normal rate and regular rhythm.   Murmur heard. Pulmonary/Chest: She has wheezes.  Increased effort required during walking, cannot stand for more than a minute or so either   Abdominal: Soft. Bowel sounds are normal. She exhibits no distension and no mass. There is no tenderness. A hernia is present.  Appears to have ventral hernia, has abdominal obesity  Musculoskeletal: Normal range of motion. She exhibits no edema.  Ambulates with rollator walker with a place for her O2  Neurological: She is alert and oriented to person, place, and time. No cranial nerve deficit.  Skin: Skin is warm and dry. She is not diaphoretic.  Psychiatric: She has a normal mood and affect.   Labs reviewed: 01/17/2011 Hospital Labs [facility] WBC 6.8, RB C 3.74, Hgb 10.3, Hct 34.4,  D-Dimer 1.82, CO2 39  Glucose 135, BUN 11, Creatinine 0.82 CMP: glucose 119, BUN 12, Creatinine 1.1 TBIL 0.2,CK 397, CK-MB 400 Urine Protein Tra,ce Glucose Capillary 204, 172, 143 Mchc 29.9,RDW 15.8  ESR 34 01/18/2011 Hospital Labs: PH 7.302, PC02 79.7, PO2 78.6, BICARB 39.9, BASE 13.4 BNP, 195.0,CK10Index 2.7, TSH 0.198, ESR 34  Glucose Capillary 130, 165, 127, 217 01/19/2011 Hospital Labs: RBC 3.73, Hgb 10.4, Hct 33.6, RDW 15.7 01/21/2011 Hospital Labs: Hgb 11.9, CL 95, CO2 39, Glucose 134, BUN 25, Creatinine0.96, eGFR 58,  02/17/2011 BMP Glucose 101 Bun 13 Creatinine 0.90  HA1C 6.8  05/23/2011 CBC Wbc 5.9 Rbc 3.57 Hemoglobin 9.2  CMP Glucose 112 Bun 19 Creatinine 1.05  HA1C 6.0  Lipid Panel Cholesterol 225 Triglycerides 69 Hdl 89 Ldl 122  08/08/2011 [facility] labs CBC: Rbc 3.76, Hgb 10.1, Hct 32.8, Rdw 15.8 CMP: C02 36, glucose 119, BUN 26, Total Bilirubin 0.1 Urine dipstick Hgb moderate, protein 100  12/26/11 CMP; Glucose 113, BUN 22, Creatinine 1.00 A1c 6.9 Lipid Panel; Cholesterol 208, Triglycerides 106, HDL 74, LDL 113  Past Procedures: 1914- Mammogram 2010- Colonoscopy 07/01/2010- EKG: [facility] 07/03/2010- Chest x-ray: Cardiomegaly and bibasilar atelectasis. Apparent right paratracheal soft tissue fullness. Favored to be related to ectatic great vessels.  07/04/2010- CT Chest- No significant pulmonary embolus. The study is moderately degraded by patient breathing motion and suboptimal bolus. Cardiomegaly without failure. 01/17/11 CT Chest w/contrast - Negative for pulmonary embolism 04/25/11 Bone Density Scan - Normal 04/25/2011 Mammogram - No evidence of malignancy. 08/08/11 CT Abdomen Pelvis w/contrast - Stable pericardial effusion. Small liver cysts. Small ventral hernia containing fluid.  Assessment/Plan Essential hypertension, benign Previously prescribed BP cuff, but has not been checking at home.  Recommended she do this b/c value here is elevated, but she always is anxious and rushing to get here.  Cont  hctz 25mg   and losartan 100mg  daily at this time.  Had allergy to lisinopril and COPD is severe so I am trying to avoid beta blockers.  Could consider hydralazine and nitrates, but pt is not adherent with medications much of the time so I do not want to use meds she has to take multiple times throughout the day.    COPD (chronic obstructive pulmonary disease) On chronic O2.  Also on symbicort (which is expensive for her) so online coupon through drug company was found and printed for her to use today.  Also on albuterol nebs and inhaler.  Said she was taking spiriva, but this was removed from her list.  Per pulmonary notes (and based on her severe COPD) she should still be taking this.  Will need to call her back to clarify.    COPD (chronic obstructive pulmonary disease) with chronic bronchitis Last flare was 9/13.  Had been doing well.  Need to make sure she is using symbicort and spiriva.  Type II or unspecified type diabetes mellitus with unspecified complication, uncontrolled F/u hba1c. Has been improving.  Encouraged exercise and low carb, higher protein diet due to her lung disease, too.  Continue metformin 1000mg  po bid with meals, ARB, asa 81, and statin.  Also f/u bmp for renal function.    Mixed hyperlipidemia Last lipids were not at goal in July and statin was increased.  Need to recheck fasting at next visit.  She has not been good about getting labs prior to appointments and usually comes in the late morning or afternoon so she has not fasted.  History of colon polyps Had adenomatous polyps remove on 09/30/11 colonoscopy by Dr. Rhea Belton.  Will require 3 year f/u cscope (09/30/14).    Obesity Diet and exercise reviewed again today.  She is limited greatly by her lung disease.    Insomnia Encouraged more physical activity and lower carb diet to help with energy and sleep.  Discussed conservative treatments like chamomile tea, warm milk and melatonin to help with sleep.    Iron  deficiency anemia Will f/u cbc and iron panel with ferritin today.     Labs/tests ordered:  Cbc, bmp, hba1c, iron panel with ferritin all today AND will need flp at next visit or soon after it

## 2012-09-25 LAB — CBC WITH DIFFERENTIAL/PLATELET
Basophils Absolute: 0 10*3/uL (ref 0.0–0.2)
Basos: 0 % (ref 0–3)
Eos: 3 % (ref 0–5)
Eosinophils Absolute: 0.2 10*3/uL (ref 0.0–0.4)
HCT: 31.8 % — ABNORMAL LOW (ref 34.0–46.6)
Hemoglobin: 9.4 g/dL — ABNORMAL LOW (ref 11.1–15.9)
Immature Grans (Abs): 0 10*3/uL (ref 0.0–0.1)
Immature Granulocytes: 0 % (ref 0–2)
Lymphocytes Absolute: 1.8 10*3/uL (ref 0.7–3.1)
Lymphs: 31 % (ref 14–46)
MCH: 25.1 pg — ABNORMAL LOW (ref 26.6–33.0)
MCHC: 29.6 g/dL — ABNORMAL LOW (ref 31.5–35.7)
MCV: 85 fL (ref 79–97)
Monocytes Absolute: 0.5 10*3/uL (ref 0.1–0.9)
Monocytes: 9 % (ref 4–12)
Neutrophils Absolute: 3.4 10*3/uL (ref 1.4–7.0)
Neutrophils Relative %: 57 % (ref 40–74)
RBC: 3.75 x10E6/uL — ABNORMAL LOW (ref 3.77–5.28)
RDW: 17 % — ABNORMAL HIGH (ref 12.3–15.4)
WBC: 6 10*3/uL (ref 3.4–10.8)

## 2012-09-25 LAB — BASIC METABOLIC PANEL
BUN/Creatinine Ratio: 21 (ref 11–26)
BUN: 24 mg/dL (ref 8–27)
CO2: 34 mmol/L — ABNORMAL HIGH (ref 19–28)
Calcium: 10.2 mg/dL (ref 8.6–10.2)
Chloride: 98 mmol/L (ref 97–108)
Creatinine, Ser: 1.14 mg/dL — ABNORMAL HIGH (ref 0.57–1.00)
GFR calc Af Amer: 58 mL/min/{1.73_m2} — ABNORMAL LOW (ref >59)
GFR calc non Af Amer: 50 mL/min/{1.73_m2} — ABNORMAL LOW (ref >59)
Glucose: 84 mg/dL (ref 65–99)
Potassium: 4.1 mmol/L (ref 3.5–5.2)
Sodium: 144 mmol/L (ref 134–144)

## 2012-09-25 LAB — HEMOGLOBIN A1C
Est. average glucose Bld gHb Est-mCnc: 146 mg/dL
Hgb A1c MFr Bld: 6.7 % — ABNORMAL HIGH (ref 4.8–5.6)

## 2012-09-25 LAB — FERRITIN: Ferritin: 10 ng/mL — ABNORMAL LOW (ref 15–150)

## 2012-09-25 LAB — IRON AND TIBC
Iron Saturation: 7 % — CL (ref 15–55)
Iron: 30 ug/dL — ABNORMAL LOW (ref 35–155)
TIBC: 415 ug/dL (ref 250–450)
UIBC: 385 ug/dL — ABNORMAL HIGH (ref 150–375)

## 2012-10-18 DIAGNOSIS — D509 Iron deficiency anemia, unspecified: Secondary | ICD-10-CM | POA: Insufficient documentation

## 2012-10-18 NOTE — Assessment & Plan Note (Signed)
Diet and exercise reviewed again today.  She is limited greatly by her lung disease.

## 2012-10-18 NOTE — Assessment & Plan Note (Signed)
Had adenomatous polyps remove on 09/30/11 colonoscopy by Dr. Rhea Belton.  Will require 3 year f/u cscope (09/30/14).

## 2012-10-18 NOTE — Assessment & Plan Note (Signed)
Last lipids were not at goal in July and statin was increased.  Need to recheck fasting at next visit.  She has not been good about getting labs prior to appointments and usually comes in the late morning or afternoon so she has not fasted.

## 2012-10-18 NOTE — Assessment & Plan Note (Signed)
F/u hba1c. Has been improving.  Encouraged exercise and low carb, higher protein diet due to her lung disease, too.  Continue metformin 1000mg  po bid with meals, ARB, asa 81, and statin.  Also f/u bmp for renal function.

## 2012-10-18 NOTE — Assessment & Plan Note (Signed)
Last flare was 9/13.  Had been doing well.  Need to make sure she is using symbicort and spiriva.

## 2012-10-18 NOTE — Assessment & Plan Note (Addendum)
Encouraged more physical activity and lower carb diet to help with energy and sleep.  Discussed conservative treatments like chamomile tea, warm milk and melatonin to help with sleep.

## 2012-10-18 NOTE — Assessment & Plan Note (Signed)
Will f/u cbc and iron panel with ferritin today.

## 2012-10-18 NOTE — Assessment & Plan Note (Signed)
Previously prescribed BP cuff, but has not been checking at home.  Recommended she do this b/c value here is elevated, but she always is anxious and rushing to get here.  Cont hctz 25mg  and losartan 100mg  daily at this time.  Had allergy to lisinopril and COPD is severe so I am trying to avoid beta blockers.  Could consider hydralazine and nitrates, but pt is not adherent with medications much of the time so I do not want to use meds she has to take multiple times throughout the day.

## 2012-10-18 NOTE — Assessment & Plan Note (Signed)
On chronic O2.  Also on symbicort (which is expensive for her) so online coupon through drug company was found and printed for her to use today.  Also on albuterol nebs and inhaler.  Said she was taking spiriva, but this was removed from her list.  Per pulmonary notes (and based on her severe COPD) she should still be taking this.  Will need to call her back to clarify.

## 2012-10-21 ENCOUNTER — Other Ambulatory Visit: Payer: Self-pay | Admitting: Geriatric Medicine

## 2012-10-21 DIAGNOSIS — J449 Chronic obstructive pulmonary disease, unspecified: Secondary | ICD-10-CM

## 2012-10-21 NOTE — Telephone Encounter (Signed)
Per Dr. Renato Gails, call patient to see if she is taking Spiriva and Symbicort. I called patient and she is not taking either one and cannot afford them. I made an appointment for patient to come in for an office visit to see Dr. Renato Gails and go over medications and get new prescriptions.

## 2012-10-23 ENCOUNTER — Encounter: Payer: Self-pay | Admitting: *Deleted

## 2012-10-26 ENCOUNTER — Ambulatory Visit (INDEPENDENT_AMBULATORY_CARE_PROVIDER_SITE_OTHER): Payer: Medicare Other | Admitting: Internal Medicine

## 2012-10-26 VITALS — BP 138/92 | HR 75 | Temp 98.1°F | Resp 18 | Ht 62.0 in | Wt 196.0 lb

## 2012-10-26 DIAGNOSIS — J449 Chronic obstructive pulmonary disease, unspecified: Secondary | ICD-10-CM

## 2012-10-26 DIAGNOSIS — J4489 Other specified chronic obstructive pulmonary disease: Secondary | ICD-10-CM

## 2012-10-26 MED ORDER — INDOMETHACIN 25 MG PO CAPS: 25.0000 mg | ORAL_CAPSULE | Freq: Three times a day (TID) | ORAL | Status: DC | PRN

## 2012-10-26 MED ORDER — HYDROCHLOROTHIAZIDE 25 MG PO TABS: 25.0000 mg | ORAL_TABLET | Freq: Every day | ORAL | Status: DC

## 2012-10-26 MED ORDER — BUDESONIDE-FORMOTEROL FUMARATE 80-4.5 MCG/ACT IN AERO: 2.0000 | INHALATION_SPRAY | Freq: Two times a day (BID) | RESPIRATORY_TRACT | Status: DC

## 2012-11-06 ENCOUNTER — Encounter (HOSPITAL_COMMUNITY): Payer: Self-pay | Admitting: Emergency Medicine

## 2012-11-06 ENCOUNTER — Inpatient Hospital Stay (HOSPITAL_COMMUNITY)
Admission: EM | Admit: 2012-11-06 | Discharge: 2012-11-10 | DRG: 191 | Disposition: A | Discharge status: Home Health | Payer: Medicare Other | Attending: Internal Medicine | Admitting: Internal Medicine

## 2012-11-06 ENCOUNTER — Emergency Department (HOSPITAL_COMMUNITY): Payer: Medicare Other

## 2012-11-06 DIAGNOSIS — I10 Essential (primary) hypertension: Secondary | ICD-10-CM | POA: Diagnosis present

## 2012-11-06 DIAGNOSIS — E785 Hyperlipidemia, unspecified: Secondary | ICD-10-CM | POA: Diagnosis present

## 2012-11-06 DIAGNOSIS — E782 Mixed hyperlipidemia: Secondary | ICD-10-CM

## 2012-11-06 DIAGNOSIS — G4700 Insomnia, unspecified: Secondary | ICD-10-CM

## 2012-11-06 DIAGNOSIS — J9610 Chronic respiratory failure, unspecified whether with hypoxia or hypercapnia: Secondary | ICD-10-CM | POA: Diagnosis present

## 2012-11-06 DIAGNOSIS — J449 Chronic obstructive pulmonary disease, unspecified: Secondary | ICD-10-CM

## 2012-11-06 DIAGNOSIS — I3139 Other pericardial effusion (noninflammatory): Secondary | ICD-10-CM | POA: Diagnosis present

## 2012-11-06 DIAGNOSIS — IMO0002 Reserved for concepts with insufficient information to code with codable children: Secondary | ICD-10-CM | POA: Diagnosis present

## 2012-11-06 DIAGNOSIS — J45901 Unspecified asthma with (acute) exacerbation: Principal | ICD-10-CM | POA: Diagnosis present

## 2012-11-06 DIAGNOSIS — D509 Iron deficiency anemia, unspecified: Secondary | ICD-10-CM

## 2012-11-06 DIAGNOSIS — Z87891 Personal history of nicotine dependence: Secondary | ICD-10-CM

## 2012-11-06 DIAGNOSIS — Z6835 Body mass index (BMI) 35.0-35.9, adult: Secondary | ICD-10-CM

## 2012-11-06 DIAGNOSIS — E118 Type 2 diabetes mellitus with unspecified complications: Secondary | ICD-10-CM

## 2012-11-06 DIAGNOSIS — Z79899 Other long term (current) drug therapy: Secondary | ICD-10-CM

## 2012-11-06 DIAGNOSIS — Z86010 Personal history of colonic polyps: Secondary | ICD-10-CM

## 2012-11-06 DIAGNOSIS — N179 Acute kidney failure, unspecified: Secondary | ICD-10-CM | POA: Diagnosis present

## 2012-11-06 DIAGNOSIS — I313 Pericardial effusion (noninflammatory): Secondary | ICD-10-CM | POA: Diagnosis present

## 2012-11-06 DIAGNOSIS — Z9981 Dependence on supplemental oxygen: Secondary | ICD-10-CM

## 2012-11-06 DIAGNOSIS — E669 Obesity, unspecified: Secondary | ICD-10-CM

## 2012-11-06 DIAGNOSIS — R0789 Other chest pain: Secondary | ICD-10-CM

## 2012-11-06 DIAGNOSIS — J441 Chronic obstructive pulmonary disease with (acute) exacerbation: Principal | ICD-10-CM | POA: Diagnosis present

## 2012-11-06 DIAGNOSIS — E1165 Type 2 diabetes mellitus with hyperglycemia: Secondary | ICD-10-CM | POA: Diagnosis present

## 2012-11-06 DIAGNOSIS — E1149 Type 2 diabetes mellitus with other diabetic neurological complication: Secondary | ICD-10-CM | POA: Diagnosis present

## 2012-11-06 DIAGNOSIS — I318 Other specified diseases of pericardium: Secondary | ICD-10-CM | POA: Diagnosis present

## 2012-11-06 DIAGNOSIS — E1142 Type 2 diabetes mellitus with diabetic polyneuropathy: Secondary | ICD-10-CM | POA: Diagnosis present

## 2012-11-06 DIAGNOSIS — R079 Chest pain, unspecified: Secondary | ICD-10-CM | POA: Diagnosis present

## 2012-11-06 LAB — POCT I-STAT TROPONIN I: Troponin i, poc: 0.01 ng/mL (ref 0.00–0.08)

## 2012-11-06 LAB — CBC
MCV: 84.7 fL (ref 78.0–100.0)
Platelets: 275 10*3/uL (ref 150–400)
RBC: 3.79 MIL/uL — ABNORMAL LOW (ref 3.87–5.11)
WBC: 8.7 10*3/uL (ref 4.0–10.5)

## 2012-11-06 LAB — BASIC METABOLIC PANEL
CO2: 30 mEq/L (ref 19–32)
Calcium: 9.7 mg/dL (ref 8.4–10.5)
GFR calc Af Amer: 61 mL/min — ABNORMAL LOW (ref >90)
Sodium: 143 mEq/L (ref 135–145)

## 2012-11-06 LAB — PRO B NATRIURETIC PEPTIDE: Pro B Natriuretic peptide (BNP): 182.3 pg/mL — ABNORMAL HIGH (ref 0–125)

## 2012-11-06 MED ORDER — INSULIN ASPART 100 UNIT/ML ~~LOC~~ SOLN
0.0000 [IU] | Freq: Every day | SUBCUTANEOUS | Status: DC
  Administered 2012-11-06: 3 [IU] via SUBCUTANEOUS
  Administered 2012-11-07: 2 [IU] via SUBCUTANEOUS

## 2012-11-06 MED ORDER — ASPIRIN EC 325 MG PO TBEC
325.0000 mg | DELAYED_RELEASE_TABLET | Freq: Every day | ORAL | Status: DC
  Administered 2012-11-07 – 2012-11-10 (×5): 325 mg via ORAL
  Filled 2012-11-06 (×5): qty 1

## 2012-11-06 MED ORDER — SODIUM CHLORIDE 0.9 % IV SOLN
INTRAVENOUS | Status: DC
  Administered 2012-11-06 – 2012-11-09 (×3): via INTRAVENOUS

## 2012-11-06 MED ORDER — SIMVASTATIN 20 MG PO TABS
20.0000 mg | ORAL_TABLET | Freq: Every day | ORAL | Status: DC
  Administered 2012-11-07 – 2012-11-09 (×3): 20 mg via ORAL
  Filled 2012-11-06 (×4): qty 1

## 2012-11-06 MED ORDER — ONDANSETRON HCL 4 MG/2ML IJ SOLN: 4.0000 mg | Freq: Four times a day (QID) | INTRAMUSCULAR | Status: DC | PRN

## 2012-11-06 MED ORDER — METHYLPREDNISOLONE SODIUM SUCC 125 MG IJ SOLR
80.0000 mg | Freq: Two times a day (BID) | INTRAMUSCULAR | Status: DC
  Administered 2012-11-07: via INTRAVENOUS
  Filled 2012-11-06 (×4): qty 1.28

## 2012-11-06 MED ORDER — METHYLPREDNISOLONE SODIUM SUCC 125 MG IJ SOLR
125.0000 mg | Freq: Four times a day (QID) | INTRAMUSCULAR | Status: AC
  Administered 2012-11-07 (×4): 125 mg via INTRAVENOUS
  Filled 2012-11-06 (×3): qty 2

## 2012-11-06 MED ORDER — ALUM & MAG HYDROXIDE-SIMETH 200-200-20 MG/5ML PO SUSP: 30.0000 mL | Freq: Four times a day (QID) | ORAL | Status: DC | PRN

## 2012-11-06 MED ORDER — IPRATROPIUM BROMIDE 0.02 % IN SOLN
0.5000 mg | Freq: Once | RESPIRATORY_TRACT | Status: AC
  Administered 2012-11-06: 0.5 mg via RESPIRATORY_TRACT
  Filled 2012-11-06: qty 2.5

## 2012-11-06 MED ORDER — ACETAMINOPHEN 650 MG RE SUPP: 650.0000 mg | Freq: Four times a day (QID) | RECTAL | Status: DC | PRN

## 2012-11-06 MED ORDER — SODIUM CHLORIDE 0.9 % IJ SOLN
3.0000 mL | Freq: Two times a day (BID) | INTRAMUSCULAR | Status: DC
  Administered 2012-11-07 – 2012-11-10 (×5): 3 mL via INTRAVENOUS

## 2012-11-06 MED ORDER — ZOLPIDEM TARTRATE 5 MG PO TABS
5.0000 mg | ORAL_TABLET | Freq: Every evening | ORAL | Status: DC | PRN
  Administered 2012-11-06 – 2012-11-09 (×4): 5 mg via ORAL
  Filled 2012-11-06 (×4): qty 1

## 2012-11-06 MED ORDER — IRON 325 (65 FE) MG PO TABS: 1.0000 | ORAL_TABLET | Freq: Every day | ORAL | Status: DC

## 2012-11-06 MED ORDER — ENOXAPARIN SODIUM 150 MG/ML ~~LOC~~ SOLN: 1.0000 mg/kg | Freq: Two times a day (BID) | SUBCUTANEOUS | Status: DC

## 2012-11-06 MED ORDER — ONDANSETRON HCL 4 MG PO TABS: 4.0000 mg | ORAL_TABLET | Freq: Four times a day (QID) | ORAL | Status: DC | PRN

## 2012-11-06 MED ORDER — ALBUTEROL SULFATE HFA 108 (90 BASE) MCG/ACT IN AERS
2.0000 | INHALATION_SPRAY | RESPIRATORY_TRACT | Status: DC | PRN
  Filled 2012-11-06: qty 6.7

## 2012-11-06 MED ORDER — HYDRALAZINE HCL 20 MG/ML IJ SOLN
10.0000 mg | Freq: Four times a day (QID) | INTRAMUSCULAR | Status: DC | PRN
  Administered 2012-11-07 (×3): 10 mg via INTRAVENOUS
  Filled 2012-11-06 (×3): qty 1

## 2012-11-06 MED ORDER — ALBUTEROL SULFATE (5 MG/ML) 0.5% IN NEBU
5.0000 mg | INHALATION_SOLUTION | RESPIRATORY_TRACT | Status: DC | PRN
  Administered 2012-11-06 (×3): 5 mg via RESPIRATORY_TRACT
  Filled 2012-11-06 (×2): qty 1

## 2012-11-06 MED ORDER — ENOXAPARIN SODIUM 40 MG/0.4ML ~~LOC~~ SOLN
40.0000 mg | SUBCUTANEOUS | Status: DC
  Administered 2012-11-06 – 2012-11-09 (×4): 40 mg via SUBCUTANEOUS
  Filled 2012-11-06 (×5): qty 0.4

## 2012-11-06 MED ORDER — ACETAMINOPHEN 325 MG PO TABS: 650.0000 mg | ORAL_TABLET | Freq: Four times a day (QID) | ORAL | Status: DC | PRN

## 2012-11-06 MED ORDER — IPRATROPIUM BROMIDE 0.02 % IN SOLN
0.5000 mg | RESPIRATORY_TRACT | Status: DC
  Administered 2012-11-06: 0.5 mg via RESPIRATORY_TRACT
  Filled 2012-11-06: qty 2.5

## 2012-11-06 MED ORDER — OXYCODONE HCL 5 MG PO TABS: 5.0000 mg | ORAL_TABLET | ORAL | Status: DC | PRN

## 2012-11-06 MED ORDER — METHYLPREDNISOLONE SODIUM SUCC 125 MG IJ SOLR
125.0000 mg | Freq: Once | INTRAMUSCULAR | Status: AC
  Administered 2012-11-06: 125 mg via INTRAVENOUS
  Filled 2012-11-06: qty 2

## 2012-11-06 MED ORDER — NITROGLYCERIN 2 % TD OINT
1.0000 [in_us] | TOPICAL_OINTMENT | Freq: Four times a day (QID) | TRANSDERMAL | Status: DC
  Administered 2012-11-07 – 2012-11-10 (×15): 1 [in_us] via TOPICAL
  Filled 2012-11-06: qty 30

## 2012-11-06 MED ORDER — HYDROCHLOROTHIAZIDE 25 MG PO TABS
25.0000 mg | ORAL_TABLET | Freq: Every day | ORAL | Status: DC
  Administered 2012-11-07 – 2012-11-09 (×3): 25 mg via ORAL
  Filled 2012-11-06 (×4): qty 1

## 2012-11-06 MED ORDER — TIOTROPIUM BROMIDE MONOHYDRATE 18 MCG IN CAPS
18.0000 ug | ORAL_CAPSULE | Freq: Every day | RESPIRATORY_TRACT | Status: DC
  Filled 2012-11-06: qty 5

## 2012-11-06 MED ORDER — ALBUTEROL SULFATE (5 MG/ML) 0.5% IN NEBU
2.5000 mg | INHALATION_SOLUTION | RESPIRATORY_TRACT | Status: DC
  Administered 2012-11-06: 2.5 mg via RESPIRATORY_TRACT
  Filled 2012-11-06: qty 0.5

## 2012-11-06 MED ORDER — ALBUTEROL SULFATE (5 MG/ML) 0.5% IN NEBU
5.0000 mg | INHALATION_SOLUTION | RESPIRATORY_TRACT | Status: DC
  Filled 2012-11-06: qty 1

## 2012-11-06 MED ORDER — INSULIN ASPART 100 UNIT/ML ~~LOC~~ SOLN
0.0000 [IU] | SUBCUTANEOUS | Status: DC
  Administered 2012-11-07: 2 [IU] via SUBCUTANEOUS
  Administered 2012-11-08 (×3): 1 [IU] via SUBCUTANEOUS

## 2012-11-06 MED ORDER — ALBUTEROL SULFATE (5 MG/ML) 0.5% IN NEBU
2.5000 mg | INHALATION_SOLUTION | Freq: Four times a day (QID) | RESPIRATORY_TRACT | Status: DC
  Administered 2012-11-07 (×2): 2.5 mg via RESPIRATORY_TRACT
  Filled 2012-11-06 (×2): qty 0.5

## 2012-11-06 MED ORDER — INSULIN ASPART 100 UNIT/ML ~~LOC~~ SOLN
0.0000 [IU] | Freq: Three times a day (TID) | SUBCUTANEOUS | Status: DC
  Administered 2012-11-07: 1 [IU] via SUBCUTANEOUS
  Administered 2012-11-07 (×2): 2 [IU] via SUBCUTANEOUS
  Administered 2012-11-08: 1 [IU] via SUBCUTANEOUS
  Administered 2012-11-08: 2 [IU] via SUBCUTANEOUS
  Administered 2012-11-08: 1 [IU] via SUBCUTANEOUS
  Administered 2012-11-09 (×3): 2 [IU] via SUBCUTANEOUS

## 2012-11-06 MED ORDER — FERROUS SULFATE 325 (65 FE) MG PO TABS
325.0000 mg | ORAL_TABLET | Freq: Every day | ORAL | Status: DC
  Administered 2012-11-07 – 2012-11-10 (×4): 325 mg via ORAL
  Filled 2012-11-06 (×6): qty 1

## 2012-11-06 MED ORDER — LOSARTAN POTASSIUM 50 MG PO TABS
100.0000 mg | ORAL_TABLET | Freq: Every day | ORAL | Status: DC
  Administered 2012-11-07 – 2012-11-10 (×4): 100 mg via ORAL
  Filled 2012-11-06 (×5): qty 2

## 2012-11-06 MED ORDER — HYDROMORPHONE HCL PF 1 MG/ML IJ SOLN: 0.5000 mg | INTRAMUSCULAR | Status: DC | PRN

## 2012-11-06 NOTE — ED Notes (Signed)
Bed:WA07<BR> Expected date:<BR> Expected time:<BR> Means of arrival:<BR> Comments:<BR> EMS

## 2012-11-06 NOTE — H&P (Signed)
[facility] History and Physical  [name] ZOX:[id number] DOB: 08-30-1945 DOA: 11/06/2012  Referring physician: EDP PCP: Bufford Spikes, DO  Specialists:   Chief Complaint:  SOB  HPI: [name] is a 67 y.o. female with O2 dependent COPD who presents to the ED with complaints of worsening SOB and Chest pressure with exertion over the past week and a half.   She denies having any fevers or chills, nausea or vomiting.      Review of Systems: The patient denies anorexia, fever, chills, headaches, weight loss, vision loss, decreased hearing, hoarseness, chest pain, syncope, peripheral edema, balance deficits, hemoptysis, abdominal pain, nausea, vomiting, diarrhea, hematemesis, melena, hematochezia, severe indigestion/heartburn, hematuria, incontinence, dysuria, muscle weakness, suspicious skin lesions, transient blindness, difficulty walking, depression, unusual weight change, abnormal bleeding, enlarged lymph nodes, angioedema, and breast masses.    Past Medical History  Diagnosis Date  . Asthma   . COPD (chronic obstructive pulmonary disease)   . Hypertension   . Diabetes mellitus 09/13/2010    glucophage  . Gout   . Oxygen dependent     pt uses O2   . Pericardial effusion 08/2011    small residual on echo  . Hyperlipidemia   . Type II or unspecified type diabetes mellitus with neurological manifestations, uncontrolled(250.62)   . Personal history of noncompliance with medical treatment, presenting hazards to health   . Unspecified disease of pericardium   . Hemorrhage of rectum and anus   . Panic disorder without agoraphobia   . Depressive disorder, not elsewhere classified   . Edema   . Anxiety state, unspecified   . Unspecified hereditary and idiopathic peripheral neuropathy   . Type II or unspecified type diabetes mellitus without mention of complication, not stated as uncontrolled   . Other and unspecified hyperlipidemia   . Obesity, unspecified   .  Unspecified tinnitus   . Unspecified essential hypertension   . Pneumonia, organism unspecified   . Chronic obstructive asthma, unspecified   . Acute and chronic respiratory failure   . Urinary frequency   . Urgency of urination    Past Surgical History  Procedure Laterality Date  . Breast biopsy      both breasts  . Total abdominal hysterectomy      Medications:  HOME MEDS: Prior to Admission medications   Medication Sig Start Date End Date Taking? Authorizing Provider  albuterol (PROAIR HFA) 108 (90 BASE) MCG/ACT inhaler Inhale 2 puffs into the lungs every 4 (four) hours as needed for shortness of breath.    Yes Historical Provider, MD  albuterol (PROVENTIL) (2.5 MG/3ML) 0.083% nebulizer solution Take 2.5 mg by nebulization every 6 (six) hours as needed for shortness of breath.    Yes Historical Provider, MD  budesonide-formoterol (SYMBICORT) 80-4.5 MCG/ACT inhaler Inhale 2 puffs into the lungs 2 (two) times daily. 10/26/12  Yes [name] L Reed, DO  Ferrous Sulfate (IRON) 325 (65 FE) MG TABS Take 1 tablet by mouth daily.     Yes Historical Provider, MD  hydrochlorothiazide (HYDRODIURIL) 25 MG tablet Take 1 tablet (25 mg total) by mouth daily. 10/26/12  Yes [name] L Reed, DO  indomethacin (INDOCIN) 25 MG capsule Take 25 mg by mouth 3 (three) times daily as needed (for gout).   Yes Historical Provider, MD  losartan (COZAAR) 100 MG tablet Take 1 tablet (100 mg total) by mouth daily. 09/24/12  Yes [name] L Reed, DO  lovastatin (MEVACOR) 40 MG tablet Take 40 mg by mouth at bedtime.  Yes Historical Provider, MD  metFORMIN (GLUCOPHAGE) 500 MG tablet Take 500 mg by mouth 2 (two) times daily with a meal.   Yes Historical Provider, MD  Multiple Vitamin (MULTIVITAMIN WITH MINERALS) TABS Take 1 tablet by mouth daily.   Yes Historical Provider, MD  tiotropium (SPIRIVA) 18 MCG inhalation capsule Place 18 mcg into inhaler and inhale daily.   Yes Historical Provider, MD  OXYGEN-HELIUM IN Inhale 2  L/min into the lungs daily. 24/7    Historical Provider, MD    Allergies:  Allergies  Allergen Reactions  . Lisinopril Swelling    Angioedema   . Advair Diskus (Fluticasone-Salmeterol) Other (See Comments)    Nose bleeds.   . Penicillins     hives    Social History:   reports that she quit smoking about 8 years ago. Her smoking use included Cigarettes. She has a 30 pack-year smoking history. She has never used smokeless tobacco. She reports that she drinks about 1.0 ounces of alcohol per week. She reports that she does not use illicit drugs.  Family History: Family History  Problem Relation Age of Onset  . COPD Mother   . COPD Sister   . Hypertension Sister   . Cancer Father   . Asthma Father   . Diabetes Other     3 maternal aunts  . Prostate cancer Paternal Uncle      Physical Exam:  GEN:  Pleasant 67 year old Obese well developed Elderly African [place] Female examined  and in no acute distress; cooperative with exam Filed Vitals:   11/06/12 1924 11/06/12 2004 11/06/12 2025 11/06/12 2118  BP:  164/88    Pulse:  109    Temp:  98.6 F (37 C)    TempSrc:  Oral    Resp:  22    SpO2: 96% 96% 99% 94%   Blood pressure 164/88, pulse 109, temperature 98.6 F (37 C), temperature source Oral, resp. rate 22, SpO2 94.00%. PSYCH: She is alert and oriented x4; does not appear anxious does not appear depressed; affect is normal HEENT: Normocephalic and Atraumatic, Mucous membranes pink; PERRLA; EOM intact; Fundi:  Benign;  No scleral icterus, Nares: Patent, Oropharynx: Clear, Fair Dentition, Neck:  FROM, no cervical lymphadenopathy nor thyromegaly or carotid bruit; no JVD; Breasts:: Not examined CHEST WALL: No tenderness CHEST: Normal respiration, clear to auscultation bilaterally NO wheezes, No rales , and No rhonchi, mildly decreased BS.    HEART: Regular rate and rhythm; no murmurs rubs or gallops BACK: No kyphosis or scoliosis; no CVA tenderness ABDOMEN: Positive Bowel  Sounds,  Obese, soft non-tender; no masses, no organomegaly.    Rectal Exam: Not done EXTREMITIES: No cyanosis, clubbing or edema; no ulcerations. Genitalia: not examined PULSES: 2+ and symmetric SKIN: Normal hydration no rash or ulceration CNS: Cranial nerves 2-12 grossly intact no focal neurologic deficit   Labs & Imaging Results for orders placed during the hospital encounter of 11/06/12 (from the past 48 hour(s))  BASIC METABOLIC PANEL     Status: Abnormal   Collection Time    11/06/12  4:15 PM      Result Value Range   Sodium 143  135 - 145 mEq/L   Potassium 4.1  3.5 - 5.1 mEq/L   Chloride 104  96 - 112 mEq/L   CO2 30  19 - 32 mEq/L   Glucose, Bld 150 (*) 70 - 99 mg/dL   BUN 21  6 - 23 mg/dL   Creatinine, Ser 1.61  0.50 -  1.10 mg/dL   Calcium 9.7  8.4 - 16.1 mg/dL   GFR calc non Af Amer 53 (*) >90 mL/min   GFR calc Af Amer 61 (*) >90 mL/min   Comment:            The eGFR has been calculated     using the CKD EPI equation.     This calculation has not been     validated in all clinical     situations.     eGFR's persistently     <90 mL/min signify     possible Chronic Kidney Disease.  CBC     Status: Abnormal   Collection Time    11/06/12  4:15 PM      Result Value Range   WBC 8.7  4.0 - 10.5 K/uL   RBC 3.79 (*) 3.87 - 5.11 MIL/uL   Hemoglobin 10.0 (*) 12.0 - 15.0 g/dL   HCT 09.6 (*) 04.5 - 40.9 %   MCV 84.7  78.0 - 100.0 fL   MCH 26.4  26.0 - 34.0 pg   MCHC 31.2  30.0 - 36.0 g/dL   RDW 81.1 (*) 91.4 - 78.2 %   Platelets 275  150 - 400 K/uL  PRO B NATRIURETIC PEPTIDE     Status: Abnormal   Collection Time    11/06/12  4:15 PM      Result Value Range   Pro B Natriuretic peptide (BNP) 182.3 (*) 0 - 125 pg/mL  POCT I-STAT TROPONIN I     Status: None   Collection Time    11/06/12  4:22 PM      Result Value Range   Troponin i, poc 0.01  0.00 - 0.08 ng/mL   Comment 3            Comment: Due to the release kinetics of cTnI,     a negative result within the first  hours     of the onset of symptoms does not rule out     myocardial infarction with certainty.     If myocardial infarction is still suspected,     repeat the test at appropriate intervals.    Radiological Exams on Admission: Dg Chest 2 View  11/06/2012   *RADIOLOGY REPORT*  Clinical Data: Worsening shortness of breath for 1 week, congestion, hypertension, diabetes, former smoker, COPD, asthma  CHEST - 2 VIEW  Comparison: 03/16/2011  Findings: Enlargement of cardiac silhouette. Tortuous thoracic aorta. Mediastinal contours and pulmonary vascular otherwise normal. Emphysematous changes without acute infiltrate, pleural effusion or pneumothorax. Diffuse osseous demineralization.  IMPRESSION: Enlargement of cardiac silhouette; patient has had pericardial effusion by prior CT exams. COPD. No acute abnormalities.   Original Report Authenticated By: Ulyses Southward, M.D.    EKG: Independently reviewed.   Assessment/Plan Principal Problem:   COPD exacerbation Active Problems:   Pericardial effusion   Type II or unspecified type diabetes mellitus with unspecified complication, uncontrolled   Essential hypertension, benign   COPD (chronic obstructive pulmonary disease) with chronic bronchitis   Chest pain    1.   COPD Exacerbation-   Duo Nebs, High dose IV Steroid taper, O2, and Azithromycin.     2.   Chest Pain-  Cardiac Rule out ordered, telemetry Monitoring, Nitropaste, O2, ASA.    3.   Pericardial Effusion-  Chronic and Stable.     4.   DM2- check HbA1C, SSI coverage, hold metformin for now.    5.   HTN- Monitor continue Losartan, PRN  IV hydralazine for SBP > 160.      Code Status:   FULL CODE Family Communication:   No Family Present Disposition Plan:   Return to Home on discharge   Time spent:  4 Minutes  Ron Parker [facility] Pager [phone]  If 7PM-7AM, please contact night-coverage [email or web address] Password [facility] 11/06/2012, 9:29 PM

## 2012-11-06 NOTE — ED Provider Notes (Signed)
History    CSN: [id number] Arrival date & time 11/06/12  1556 First MD Initiated Contact with Patient 11/06/12 1559      Chief Complaint  Patient presents with  . Shortness of Breath    HPI Since the emergency room with complaints of shortness of breath that started about a week and half ago.  Patient has history of chronic COPD. She is on home oxygen.  She has noticed gradual progression of worsening dyspnea on exertion.  She has been trying her inhalers and her medications but it does not seem to be helping. Patient states that she did not see anyone initially because she's had this many times in the past and thought it would just go away with continuing her medications. Her symptoms are not any worse when she lies flat. He does increase whenever she tries to exert herself. She does feel a heavy sensation in her chest she denies any specific pain with this.  She has not had any fever. She does feel like she has mucus in her chest. She denies any leg swelling, fevers or any other complaints.    Patient also has a history of pericardial effusion. She has known about it for years and has never required any intervention. Past Medical History  Diagnosis Date  . Asthma   . COPD (chronic obstructive pulmonary disease)   . Hypertension   . Diabetes mellitus 09/13/2010    glucophage  . Gout   . Oxygen dependent     pt uses O2   . Pericardial effusion 08/2011    small residual on echo  . Hyperlipidemia   . Type II or unspecified type diabetes mellitus with neurological manifestations, uncontrolled(250.62)   . Personal history of noncompliance with medical treatment, presenting hazards to health   . Unspecified disease of pericardium   . Hemorrhage of rectum and anus   . Panic disorder without agoraphobia   . Depressive disorder, not elsewhere classified   . Edema   . Anxiety state, unspecified   . Unspecified hereditary and idiopathic peripheral neuropathy   . Type II or unspecified type  diabetes mellitus without mention of complication, not stated as uncontrolled   . Other and unspecified hyperlipidemia   . Obesity, unspecified   . Unspecified tinnitus   . Unspecified essential hypertension   . Pneumonia, organism unspecified   . Chronic obstructive asthma, unspecified   . Acute and chronic respiratory failure   . Urinary frequency   . Urgency of urination     Past Surgical History  Procedure Laterality Date  . Breast biopsy      both breasts  . Total abdominal hysterectomy      Family History  Problem Relation Age of Onset  . COPD Mother   . COPD Sister   . Cancer Father   . Asthma Father   . Diabetes Other     3 maternal aunts  . Prostate cancer Paternal Uncle     History  Substance Use Topics  . Smoking status: Former Smoker -- 1.00 packs/day for 30 years    Types: Cigarettes    Quit date: 06/17/2004  . Smokeless tobacco: Never Used  . Alcohol Use: 1.0 oz/week    2 drink(s) per week    OB History   Grav Para Term Preterm Abortions TAB SAB Ect Mult Living                  Review of Systems  All other systems reviewed and are  negative.    Allergies  Lisinopril; Advair diskus; and Penicillins  Home Medications   Current Outpatient Rx  Name  Route  Sig  Dispense  Refill  . albuterol (PROAIR HFA) 108 (90 BASE) MCG/ACT inhaler   Inhalation   Inhale 2 puffs into the lungs every 4 (four) hours as needed for shortness of breath.          [name] albuterol (PROVENTIL) (2.5 MG/3ML) 0.083% nebulizer solution   Nebulization   Take 2.5 mg by nebulization every 6 (six) hours as needed for shortness of breath.          . budesonide-formoterol (SYMBICORT) 80-4.5 MCG/ACT inhaler   Inhalation   Inhale 2 puffs into the lungs 2 (two) times daily.   1 Inhaler   3   . Ferrous Sulfate (IRON) 325 (65 FE) MG TABS   Oral   Take 1 tablet by mouth daily.           . hydrochlorothiazide (HYDRODIURIL) 25 MG tablet   Oral   Take 1 tablet (25 mg  total) by mouth daily.   30 tablet   3   . indomethacin (INDOCIN) 25 MG capsule   Oral   Take 25 mg by mouth 3 (three) times daily as needed (for gout).         [name] losartan (COZAAR) 100 MG tablet   Oral   Take 1 tablet (100 mg total) by mouth daily.   90 tablet   3   . lovastatin (MEVACOR) 40 MG tablet   Oral   Take 40 mg by mouth at bedtime.           . metFORMIN (GLUCOPHAGE) 500 MG tablet   Oral   Take 500 mg by mouth 2 (two) times daily with a meal.         . Multiple Vitamin (MULTIVITAMIN WITH MINERALS) TABS   Oral   Take 1 tablet by mouth daily.         [name] tiotropium (SPIRIVA) 18 MCG inhalation capsule   Inhalation   Place 18 mcg into inhaler and inhale daily.         . OXYGEN-HELIUM IN   Inhalation   Inhale 2 L/min into the lungs daily. 24/7           BP 164/88  Pulse 109  Temp(Src) 98.6 F (37 C) (Oral)  Resp 22  SpO2 99%  Physical Exam  Nursing note and vitals reviewed. Constitutional: No distress.  HENT:  Head: Normocephalic and atraumatic.  Right Ear: External ear normal.  Left Ear: External ear normal.  Eyes: Conjunctivae are normal. Right eye exhibits no discharge. Left eye exhibits no discharge. No scleral icterus.  Neck: Neck supple. No tracheal deviation present.  Cardiovascular: Normal rate, regular rhythm and intact distal pulses.   Pulmonary/Chest: Accessory muscle usage present. No stridor. No respiratory distress. She has decreased breath sounds. She has no wheezes. She has no rhonchi. She has no rales. She exhibits no tenderness.  Abdominal: Soft. Bowel sounds are normal. She exhibits no distension. There is no tenderness. There is no rebound and no guarding.  Musculoskeletal: She exhibits no edema and no tenderness.  Neurological: She is alert. She has normal strength. No sensory deficit. Cranial nerve deficit:  no gross defecits noted. She exhibits normal muscle tone. She displays no seizure activity. Coordination normal.   Skin: Skin is warm and dry. No rash noted. She is not diaphoretic.  Psychiatric: She has a normal mood and  affect.    ED Course  Procedures (including critical care time) EKG Normal sinus rhythm rate 89 Nonspecific T wave changes Normal axis, normal intervals No significant change when compared to prior tracing  Medications  albuterol (PROVENTIL) (5 MG/ML) 0.5% nebulizer solution 5 mg (5 mg Nebulization Given 11/06/12 1923)  ipratropium (ATROVENT) nebulizer solution 0.5 mg (0.5 mg Nebulization Given 11/06/12 1625)  methylPREDNISolone sodium succinate (SOLU-MEDROL) 125 mg/2 mL injection 125 mg (125 mg Intravenous Given 11/06/12 1625)   Pt felt better after initial treatments.  Ambulated in the ED and felt better but still more short of breath than usual when she walked around.  Will try additional treatments and reassess.   8:31 PM patient tried walking around again after 3 treatments and steroids. She became very short of breath again and experienced chest pressure into the hallway. patient does not feel well enough to go home  Labs Reviewed  BASIC METABOLIC PANEL - Abnormal; Notable for the following:    Glucose, Bld 150 (*)    GFR calc non Af Amer 53 (*)    GFR calc Af Amer 61 (*)    All other components within normal limits  CBC - Abnormal; Notable for the following:    RBC 3.79 (*)    Hemoglobin 10.0 (*)    HCT 32.1 (*)    RDW 21.0 (*)    All other components within normal limits  PRO B NATRIURETIC PEPTIDE - Abnormal; Notable for the following:    Pro B Natriuretic peptide (BNP) 182.3 (*)    All other components within normal limits  POCT I-STAT TROPONIN I   Dg Chest 2 View  11/06/2012   *RADIOLOGY REPORT*  Clinical Data: Worsening shortness of breath for 1 week, congestion, hypertension, diabetes, former smoker, COPD, asthma  CHEST - 2 VIEW  Comparison: 03/16/2011  Findings: Enlargement of cardiac silhouette. Tortuous thoracic aorta. Mediastinal contours and pulmonary  vascular otherwise normal. Emphysematous changes without acute infiltrate, pleural effusion or pneumothorax. Diffuse osseous demineralization.  IMPRESSION: Enlargement of cardiac silhouette; patient has had pericardial effusion by prior CT exams. COPD. No acute abnormalities.   Original Report Authenticated By: Ulyses Southward, M.D.     1. COPD (chronic obstructive pulmonary disease)   2. Chest tightness or pressure       MDM   I suspect that the patient's primary symptoms are related to her COPD. She is chronically on home O2.  She does not have any history of coronary artery disease but she is complaining of dyspnea on exertion and chest pressure. Initial EKG and crit enzymes are unremarkable but this may warrant further investigation if her symptoms do not  She does have history of pericardial effusion but is not demonstrating any tampon in physiology. Echocardiogram may be beneficial for further evaluation         Celene Kras, MD 11/06/12 2033

## 2012-11-06 NOTE — ED Notes (Signed)
PER EMS- pt picked up from home with c/o SOB x1 week.  Pt has hx of COPD, was placed on prednisone x2 days.  Pt on 2l home oxygen.  Pt c/o congestion and "getting out of breath more easily".   Pt alert and oriented.

## 2012-11-06 NOTE — Progress Notes (Signed)
Rt Note: RT checked back on patient per Dr. Lynelle Doctor to assess and see if patient needs another nebulizer treatment. Patient is currently maintaining well on 2lpm nasal cannula with diminished and clear breath sounds. She is in no acute respiratory distress. Another nebulizer treatment is not currently indicated. Rt will continue to monitor and assess.

## 2012-11-06 NOTE — ED Notes (Signed)
Patient transported to X-ray 

## 2012-11-07 DIAGNOSIS — J449 Chronic obstructive pulmonary disease, unspecified: Secondary | ICD-10-CM

## 2012-11-07 LAB — BASIC METABOLIC PANEL
CO2: 30 mEq/L (ref 19–32)
Chloride: 101 mEq/L (ref 96–112)
Sodium: 141 mEq/L (ref 135–145)

## 2012-11-07 LAB — CBC
HCT: 31.5 % — ABNORMAL LOW (ref 36.0–46.0)
Hemoglobin: 9.4 g/dL — ABNORMAL LOW (ref 12.0–15.0)
MCV: 84.7 fL (ref 78.0–100.0)
RBC: 3.72 MIL/uL — ABNORMAL LOW (ref 3.87–5.11)
WBC: 8.4 10*3/uL (ref 4.0–10.5)

## 2012-11-07 LAB — TROPONIN I
Troponin I: <0.3 ng/mL (ref <0.30)
Troponin I: <0.3 ng/mL (ref <0.30)

## 2012-11-07 LAB — FOLATE: Folate: >20 ng/mL

## 2012-11-07 LAB — IRON AND TIBC
Iron: 47 ug/dL (ref 42–135)
UIBC: 344 ug/dL (ref 125–400)

## 2012-11-07 LAB — GLUCOSE, CAPILLARY
Glucose-Capillary: 153 mg/dL — ABNORMAL HIGH (ref 70–99)
Glucose-Capillary: 192 mg/dL — ABNORMAL HIGH (ref 70–99)

## 2012-11-07 LAB — RETICULOCYTES: Retic Count, Absolute: 116.3 10*3/uL (ref 19.0–186.0)

## 2012-11-07 MED ORDER — LEVOFLOXACIN IN D5W 500 MG/100ML IV SOLN
500.0000 mg | INTRAVENOUS | Status: DC
  Administered 2012-11-07: 500 mg via INTRAVENOUS
  Filled 2012-11-07 (×2): qty 100

## 2012-11-07 MED ORDER — LORAZEPAM 0.5 MG PO TABS
0.5000 mg | ORAL_TABLET | Freq: Once | ORAL | Status: AC
  Administered 2012-11-07: 0.5 mg via ORAL
  Filled 2012-11-07: qty 1

## 2012-11-07 MED ORDER — ALBUTEROL SULFATE (5 MG/ML) 0.5% IN NEBU
INHALATION_SOLUTION | RESPIRATORY_TRACT | Status: AC
  Filled 2012-11-07: qty 0.5

## 2012-11-07 MED ORDER — ALBUTEROL SULFATE (5 MG/ML) 0.5% IN NEBU
2.5000 mg | INHALATION_SOLUTION | RESPIRATORY_TRACT | Status: DC
  Administered 2012-11-07 – 2012-11-10 (×16): 2.5 mg via RESPIRATORY_TRACT
  Filled 2012-11-07 (×17): qty 0.5

## 2012-11-07 MED ORDER — GUAIFENESIN ER 600 MG PO TB12
600.0000 mg | ORAL_TABLET | Freq: Two times a day (BID) | ORAL | Status: DC
  Administered 2012-11-07 – 2012-11-10 (×7): 600 mg via ORAL
  Filled 2012-11-07 (×8): qty 1

## 2012-11-07 MED ORDER — AZITHROMYCIN 500 MG PO TABS
500.0000 mg | ORAL_TABLET | Freq: Every day | ORAL | Status: AC
  Administered 2012-11-07 – 2012-11-08 (×3): 500 mg via ORAL
  Filled 2012-11-07 (×3): qty 1

## 2012-11-07 MED ORDER — IPRATROPIUM BROMIDE 0.02 % IN SOLN: 0.5000 mg | Freq: Four times a day (QID) | RESPIRATORY_TRACT | Status: DC

## 2012-11-07 MED ORDER — IPRATROPIUM BROMIDE 0.02 % IN SOLN
0.5000 mg | Freq: Four times a day (QID) | RESPIRATORY_TRACT | Status: DC
  Administered 2012-11-07: 0.5 mg via RESPIRATORY_TRACT
  Filled 2012-11-07: qty 2.5

## 2012-11-07 MED ORDER — IPRATROPIUM BROMIDE 0.02 % IN SOLN
0.5000 mg | RESPIRATORY_TRACT | Status: DC
  Administered 2012-11-07 – 2012-11-10 (×16): 0.5 mg via RESPIRATORY_TRACT
  Filled 2012-11-07 (×17): qty 2.5

## 2012-11-07 MED ORDER — FUROSEMIDE 10 MG/ML IJ SOLN
40.0000 mg | Freq: Once | INTRAMUSCULAR | Status: AC
  Administered 2012-11-07: 40 mg via INTRAVENOUS
  Filled 2012-11-07: qty 4

## 2012-11-07 MED ORDER — IPRATROPIUM BROMIDE 0.02 % IN SOLN: 0.5000 mg | RESPIRATORY_TRACT | Status: DC

## 2012-11-07 NOTE — Progress Notes (Signed)
[facility] PROGRESS NOTE  [name] HQI:[id number] DOB: 1946-01-15 DOA: 11/06/2012 PCP: Bufford Spikes, DO  Assessment/Plan: Principal Problem:   COPD exacerbation Active Problems:   Pericardial effusion   Type II or unspecified type diabetes mellitus with unspecified complication, uncontrolled   Essential hypertension, benign   COPD (chronic obstructive pulmonary disease) with chronic bronchitis   Chest pain     1.COPD Exacerbation- Duo Nebs, High dose IV Steroid taper, O2, and Azithromycin.  2. Chest Pain- Cardiac Rule out ordered, telemetry Monitoring, Nitropaste, O2, ASA.  3. Pericardial Effusion- Chronic and Stable.  4. DM2- check HbA1C, SSI coverage, hold metformin for now.  5. HTN- Monitor continue Losartan, PRN IV hydralazine for SBP > 160.      Code Status: full Family Communication: family updated about patient's clinical progress Disposition Plan:  As above    Brief narrative: 67 y.o. female with O2 dependent COPD who presents to the ED with complaints of worsening SOB and Chest pressure with exertion over the past week and a half. She denies having any fevers or chills, nausea or vomiting. Is awake and alert  Consultants:  none  Procedures: none  Antibiotics: none  HPI/Subjective: Still wheezing and SOB    Objective: Filed Vitals:   11/06/12 2118 11/06/12 2243 11/07/12 0141 11/07/12 0520  BP:  172/95 138/80 164/79  Pulse:  98 105 103  Temp:  98 F (36.7 C)  97.8 F (36.6 C)  TempSrc:  Oral  Oral  Resp:  20    Height:  5' 2.5" (1.588 m)    Weight:  90.084 kg (198 lb 9.6 oz)    SpO2: 94% 100% 97% 97%    Intake/Output Summary (Last 24 hours) at 11/07/12 0859 Last data filed at 11/07/12 0800  Gross per 24 hour  Intake      0 ml  Output   1550 ml  Net  -1550 ml    Exam:  PSYCH: She is alert and oriented x4; does not appear anxious does not appear depressed; affect is normal  HEENT: Normocephalic and Atraumatic, Mucous  membranes pink; PERRLA; EOM intact; Fundi: Benign; No scleral icterus, Nares: Patent, Oropharynx: Clear, Fair Dentition, Neck: FROM, no cervical lymphadenopathy nor thyromegaly or carotid bruit; no JVD;  Breasts:: Not examined  CHEST WALL: No tenderness  CHEST: Normal respiration, clear to auscultation bilaterally NO wheezes, No rales , and No rhonchi, mildly decreased BS.  HEART: Regular rate and rhythm; no murmurs rubs or gallops  BACK: No kyphosis or scoliosis; no CVA tenderness  ABDOMEN: Positive Bowel Sounds, Obese, soft non-tender; no masses, no organomegaly.  Rectal Exam: Not done  EXTREMITIES: No cyanosis, clubbing or edema; no ulcerations.  Genitalia: not examined  PULSES: 2+ and symmetric  SKIN: Normal hydration no rash or ulceration  CNS: Cranial nerves 2-12 grossly intact no focal neurologic deficit     Data Reviewed: Basic Metabolic Panel:  Recent Labs Lab 11/06/12 1615 11/07/12 0340  NA 143 141  K 4.1 4.2  CL 104 101  CO2 30 30  GLUCOSE 150* 177*  BUN 21 20  CREATININE 1.07 0.90  CALCIUM 9.7 9.4    Liver Function Tests: No results found for this basename: AST, ALT, ALKPHOS, BILITOT, PROT, ALBUMIN,  in the last 168 hours No results found for this basename: LIPASE, AMYLASE,  in the last 168 hours No results found for this basename: AMMONIA,  in the last 168 hours  CBC:  Recent Labs Lab 11/06/12 1615 11/07/12 0340  WBC 8.7  8.4  HGB 10.0* 9.4*  HCT 32.1* 31.5*  MCV 84.7 84.7  PLT 275 253    Cardiac Enzymes:  Recent Labs Lab 11/06/12 2115 11/07/12 0310  TROPONINI <0.30 <0.30   BNP (last 3 results)  Recent Labs  11/06/12 1615  PROBNP 182.3*     CBG:  Recent Labs Lab 11/06/12 2234 11/07/12 0420 11/07/12 0800  GLUCAP 253* 161* 153*    No results found for this or any previous visit (from the past 240 hour(s)).   Studies: Dg Chest 2 View  11/06/2012   *RADIOLOGY REPORT*  Clinical Data: Worsening shortness of breath for 1 week,  congestion, hypertension, diabetes, former smoker, COPD, asthma  CHEST - 2 VIEW  Comparison: 03/16/2011  Findings: Enlargement of cardiac silhouette. Tortuous thoracic aorta. Mediastinal contours and pulmonary vascular otherwise normal. Emphysematous changes without acute infiltrate, pleural effusion or pneumothorax. Diffuse osseous demineralization.  IMPRESSION: Enlargement of cardiac silhouette; patient has had pericardial effusion by prior CT exams. COPD. No acute abnormalities.   Original Report Authenticated By: Ulyses Southward, M.D.    Scheduled Meds: . albuterol  2.5 mg Nebulization Q6H  . aspirin EC  325 mg Oral Daily  . enoxaparin (LOVENOX) injection  40 mg Subcutaneous Q24H  . ferrous sulfate  325 mg Oral Q breakfast  . hydrochlorothiazide  25 mg Oral Daily  . insulin aspart  0-5 Units Subcutaneous QHS  . insulin aspart  0-9 Units Subcutaneous Q4H  . insulin aspart  0-9 Units Subcutaneous TID WC  . losartan  100 mg Oral Daily  . methylPREDNISolone (SOLU-MEDROL) injection  125 mg Intravenous Q6H  . [START ON 11/08/2012] methylPREDNISolone (SOLU-MEDROL) injection  80 mg Intravenous Q12H  . nitroGLYCERIN  1 inch Topical Q6H  . simvastatin  20 mg Oral q1800  . sodium chloride  3 mL Intravenous Q12H  . tiotropium  18 mcg Inhalation Daily   Continuous Infusions: . sodium chloride 75 mL/hr at 11/06/12 2239    Principal Problem:   COPD exacerbation Active Problems:   Pericardial effusion   Type II or unspecified type diabetes mellitus with unspecified complication, uncontrolled   Essential hypertension, benign   COPD (chronic obstructive pulmonary disease) with chronic bronchitis   Chest pain    Time spent: 40 minutes   [facility]  [facility] Pager [phone]. If 8PM-8AM, please contact night-coverage at [email or web address], password [facility] 11/07/2012, 8:59 AM  LOS: 1 day

## 2012-11-07 NOTE — Care Management Note (Addendum)
    Page 1 of 2   11/10/2012     1:39:13 PM   CARE MANAGEMENT NOTE 11/10/2012  Patient:  [name], [name]   Account Number:  [id number]  Date Initiated:  11/07/2012  Documentation initiated by:  Lanier Clam  Subjective/Objective Assessment:   ADMITTED W/SOB.COPD.     Action/Plan:   FROM HOME ALONE.HAS PCP,PULMONOLOGIST,PHARMACY.HAS HOME 02-INOGEN-HAS TRAVEL TANK,NEBS.HAS RW.   Anticipated DC Date:  11/10/2012   Anticipated DC Plan:  HOME W HOME HEALTH SERVICES      DC Planning Services  CM consult      Choice offered to / List presented to:  C-1 Patient        HH arranged  HH-1 RN  HH-10 DISEASE MANAGEMENT  HH-2 PT  HH-4 NURSE'S AIDE      HH agency  [facility]   Status of service:  Completed, signed off Medicare Important Message given?   (If response is "NO", the following Medicare IM given date fields will be blank) Date Medicare IM given:   Date Additional Medicare IM given:    Discharge Disposition:  HOME W HOME HEALTH SERVICES  Per UR Regulation:  Reviewed for med. necessity/level of care/duration of stay  If discussed at Long Length of Stay Meetings, dates discussed:    Comments:  11/10/12 Courtney Lage RN,BSN NCM [phone] [facility] CHOSEN FOR HH.SPOKE TO ABBY 2 [facility] REP,INFORMED HER OF D/C TODAY & HH ORDERS.  11/07/12 Courtney Raia RN,BSN NCM WEEKEND [phone] SPOKE TO PATIENT ABOUT SCRIPT COVERAGE.SHE HAS PART D SCRIPT COVERAGE.HAS DIFFICULTY PAYING FOR SPIRIVA,& SYMBICORT SAYS THEY ARE EXPENSIVE.PROVIDED HER WITH THE PHARMACEUTICAL WEBSITE FOR PATIENT ASST PROGRAMS.THERE ARE QUESTIONS ON THE APPLICATION THAT ASKS PERSONAL FINANCIAL INFO THAT ONLY THE PATIENT CAN ANSWER.SHE WAS APPRECIATIVE,& SAID SHE WOULD COMPLETE THE APPLICATIONS.

## 2012-11-08 ENCOUNTER — Inpatient Hospital Stay (HOSPITAL_COMMUNITY): Payer: Medicare Other

## 2012-11-08 LAB — GLUCOSE, CAPILLARY
Glucose-Capillary: 158 mg/dL — ABNORMAL HIGH (ref 70–99)
Glucose-Capillary: 193 mg/dL — ABNORMAL HIGH (ref 70–99)

## 2012-11-08 MED ORDER — LORAZEPAM 2 MG/ML IJ SOLN
1.0000 mg | Freq: Once | INTRAMUSCULAR | Status: AC
  Administered 2012-11-08: 1 mg via INTRAVENOUS
  Filled 2012-11-08: qty 1

## 2012-11-08 MED ORDER — METHYLPREDNISOLONE SODIUM SUCC 40 MG IJ SOLR
40.0000 mg | Freq: Two times a day (BID) | INTRAMUSCULAR | Status: DC
  Administered 2012-11-08 – 2012-11-09 (×3): 40 mg via INTRAVENOUS
  Filled 2012-11-08 (×4): qty 1

## 2012-11-08 MED ORDER — IOHEXOL 350 MG/ML SOLN
100.0000 mL | Freq: Once | INTRAVENOUS | Status: AC | PRN
  Administered 2012-11-08: 100 mL via INTRAVENOUS

## 2012-11-08 NOTE — Progress Notes (Signed)
[facility] PROGRESS NOTE  Peni Rupard ZOX:[id number] DOB: Mar 24, 1946 DOA: 11/06/2012 PCP: Bufford Spikes, DO  Assessment/Plan: Principal Problem:   COPD exacerbation Active Problems:   Pericardial effusion   Type II or unspecified type diabetes mellitus with unspecified complication, uncontrolled   Essential hypertension, benign   COPD (chronic obstructive pulmonary disease) with chronic bronchitis   Chest pain    1.COPD Exacerbation- Duo Nebs every 4 hours, decrease High dose IV Steroid taper, O2, and continue Azithromycin. Still SOb, there fore will do a CT angio 2. Chest Pain- Cardiac Rule out ordered, telemetry Monitoring, Nitropaste, O2, ASA.  3. Pericardial Effusion- Chronic and Stable.  4. DM2- check HbA1C, SSI coverage, hold metformin for now.  5. HTN- Monitor continue Losartan, PRN IV hydralazine for SBP > 160.    Code Status: full  Family Communication: family updated about patient's clinical progress  Disposition Plan: Possible discharge in one to 2 days   Brief narrative:  67 y.o. female with O2 dependent COPD who presents to the ED with complaints of worsening SOB and Chest pressure with exertion over the past week and a half. She denies having any fevers or chills, nausea or vomiting.  Is awake and alert  Consultants:  none Procedures:  none  Antibiotics:  none   HPI/Subjective:  Still wheezing and SOB , extremely anxious     Objective: Filed Vitals:   11/07/12 2129 11/08/12 0050 11/08/12 0404 11/08/12 0736  BP: 166/100 165/96 157/87   Pulse:   105   Temp:   98.3 F (36.8 C)   TempSrc:   Oral   Resp:   20   Height:      Weight:      SpO2:  95% 93% 97%    Intake/Output Summary (Last 24 hours) at 11/08/12 0903 Last data filed at 11/08/12 0435  Gross per 24 hour  Intake 1571.25 ml  Output   2000 ml  Net -428.75 ml    Exam:  HENT:  Head: Atraumatic.  Nose: Nose normal.  Mouth/Throat: Oropharynx is clear and moist.  Eyes:  Conjunctivae are normal. Pupils are equal, round, and reactive to light. No scleral icterus.  Neck: Neck supple. No tracheal deviation present.  Cardiovascular: Normal rate, regular rhythm, normal heart sounds and intact distal pulses.  Pulmonary/Chest: Effort normal and breath sounds normal. No respiratory distress.  Abdominal: Soft. Normal appearance and bowel sounds are normal. She exhibits no distension. There is no tenderness.  Musculoskeletal: She exhibits no edema and no tenderness.  Neurological: She is alert. No cranial nerve deficit.    Data Reviewed: Basic Metabolic Panel:  Recent Labs Lab 11/06/12 1615 11/07/12 0340  NA 143 141  K 4.1 4.2  CL 104 101  CO2 30 30  GLUCOSE 150* 177*  BUN 21 20  CREATININE 1.07 0.90  CALCIUM 9.7 9.4    Liver Function Tests: No results found for this basename: AST, ALT, ALKPHOS, BILITOT, PROT, ALBUMIN,  in the last 168 hours No results found for this basename: LIPASE, AMYLASE,  in the last 168 hours No results found for this basename: AMMONIA,  in the last 168 hours  CBC:  Recent Labs Lab 11/06/12 1615 11/07/12 0340  WBC 8.7 8.4  HGB 10.0* 9.4*  HCT 32.1* 31.5*  MCV 84.7 84.7  PLT 275 253    Cardiac Enzymes:  Recent Labs Lab 11/06/12 2115 11/07/12 0310 11/07/12 0915  TROPONINI <0.30 <0.30 <0.30   BNP (last 3 results)  Recent Labs  11/06/12 1615  PROBNP 182.3*     CBG:  Recent Labs Lab 11/07/12 1157 11/07/12 1706 11/07/12 2127 11/08/12 0420 11/08/12 0740  GLUCAP 192* 136* 176* 141* 158*    No results found for this or any previous visit (from the past 240 hour(s)).   Studies: Dg Chest 2 View  11/06/2012   *RADIOLOGY REPORT*  Clinical Data: Worsening shortness of breath for 1 week, congestion, hypertension, diabetes, former smoker, COPD, asthma  CHEST - 2 VIEW  Comparison: 03/16/2011  Findings: Enlargement of cardiac silhouette. Tortuous thoracic aorta. Mediastinal contours and pulmonary vascular  otherwise normal. Emphysematous changes without acute infiltrate, pleural effusion or pneumothorax. Diffuse osseous demineralization.  IMPRESSION: Enlargement of cardiac silhouette; patient has had pericardial effusion by prior CT exams. COPD. No acute abnormalities.   Original Report Authenticated By: Ulyses Southward, M.D.    Scheduled Meds: . albuterol  2.5 mg Nebulization Q4H  . aspirin EC  325 mg Oral Daily  . azithromycin  500 mg Oral Daily  . enoxaparin (LOVENOX) injection  40 mg Subcutaneous Q24H  . ferrous sulfate  325 mg Oral Q breakfast  . guaiFENesin  600 mg Oral BID  . hydrochlorothiazide  25 mg Oral Daily  . insulin aspart  0-5 Units Subcutaneous QHS  . insulin aspart  0-9 Units Subcutaneous Q4H  . insulin aspart  0-9 Units Subcutaneous TID WC  . ipratropium  0.5 mg Nebulization Q4H  . levofloxacin (LEVAQUIN) IV  500 mg Intravenous Q24H  . losartan  100 mg Oral Daily  . methylPREDNISolone (SOLU-MEDROL) injection  80 mg Intravenous Q12H  . nitroGLYCERIN  1 inch Topical Q6H  . simvastatin  20 mg Oral q1800  . sodium chloride  3 mL Intravenous Q12H   Continuous Infusions: . sodium chloride 75 mL/hr at 11/07/12 1514    Principal Problem:   COPD exacerbation Active Problems:   Pericardial effusion   Type II or unspecified type diabetes mellitus with unspecified complication, uncontrolled   Essential hypertension, benign   COPD (chronic obstructive pulmonary disease) with chronic bronchitis   Chest pain    Time spent: 40 minutes   [facility]  [facility] Pager [phone]. If 8PM-8AM, please contact night-coverage at [email or web address], password [facility] 11/08/2012, 9:03 AM  LOS: 2 days

## 2012-11-09 DIAGNOSIS — J449 Chronic obstructive pulmonary disease, unspecified: Secondary | ICD-10-CM

## 2012-11-09 DIAGNOSIS — R079 Chest pain, unspecified: Secondary | ICD-10-CM

## 2012-11-09 LAB — GLUCOSE, CAPILLARY
Glucose-Capillary: 158 mg/dL — ABNORMAL HIGH (ref 70–99)
Glucose-Capillary: 158 mg/dL — ABNORMAL HIGH (ref 70–99)

## 2012-11-09 LAB — COMPREHENSIVE METABOLIC PANEL
ALT: 18 U/L (ref 0–35)
AST: 18 U/L (ref 0–37)
Calcium: 9 mg/dL (ref 8.4–10.5)
Sodium: 140 mEq/L (ref 135–145)
Total Protein: 6.8 g/dL (ref 6.0–8.3)

## 2012-11-09 LAB — CBC
HCT: 32.5 % — ABNORMAL LOW (ref 36.0–46.0)
Hemoglobin: 9.5 g/dL — ABNORMAL LOW (ref 12.0–15.0)
MCH: 25.1 pg — ABNORMAL LOW (ref 26.0–34.0)
MCHC: 29.2 g/dL — ABNORMAL LOW (ref 30.0–36.0)

## 2012-11-09 MED ORDER — PREDNISONE 50 MG PO TABS
50.0000 mg | ORAL_TABLET | Freq: Every day | ORAL | Status: DC
  Administered 2012-11-10: 50 mg via ORAL
  Filled 2012-11-09 (×2): qty 1

## 2012-11-09 MED ORDER — AZITHROMYCIN 500 MG PO TABS
500.0000 mg | ORAL_TABLET | Freq: Every day | ORAL | Status: DC
  Administered 2012-11-09: 500 mg via ORAL
  Filled 2012-11-09 (×2): qty 1

## 2012-11-09 MED ORDER — HYDRALAZINE HCL 25 MG PO TABS
25.0000 mg | ORAL_TABLET | Freq: Three times a day (TID) | ORAL | Status: DC
  Administered 2012-11-09 – 2012-11-10 (×4): 25 mg via ORAL
  Filled 2012-11-09 (×6): qty 1

## 2012-11-09 MED ORDER — ALBUTEROL SULFATE (5 MG/ML) 0.5% IN NEBU: 2.5000 mg | INHALATION_SOLUTION | RESPIRATORY_TRACT | Status: DC | PRN

## 2012-11-09 NOTE — Progress Notes (Addendum)
Patient ID: [name], female   DOB: 1945-10-23, 67 y.o.   MRN: [id number]  [facility] PROGRESS NOTE  Marita Burnsed ZHY:[id number] DOB: [birth year] DOA: 11/06/2012 PCP: Bufford Spikes, DO  Brief narrative: 67 y.o. female with O2 dependent COPD who presented to the ED with complaints of worsening SOB and Chest pressure with exertion over the past week and a half. She denies having any fevers or chills, nausea or vomiting.   Principal Problem: 1.COPD Exacerbation- clinically improving and maintaining oxygen saturations at target range on 2 L [place], continue nebulizer albuterol PRN ans scheduled, continue Zithromax, d/c solumedrol and transition to prednisone  2. Chest Pain- resolved, no events on tele, continue Aspirin, BP control 3. Pericardial Effusion- Chronic and Stable.  4. DM2- HbA1C 5.9, SSI coverage, hold metformin for now.  5. HTN- Monitor, discontinue HCTZ due to acute renal failure, start hydralazine scheduled PO 6. Acute renal failure - unclear etiology, hold HCTZ, start Hydralazine scheduled, BMP in AM  Consultants:  None  Procedures/Studies: Ct Angio Chest Pe W/cm &/or Wo Cm 11/08/2012  -->  No evidence of acute pulmonary thromboembolism.  Improved small pericardial effusion.    Antibiotics:  Zithromax 5/24 -->   Code Status: Full Family Communication: Pt at bedside Disposition Plan: Home when medically stable  HPI/Subjective: No events overnight.   Objective: Filed Vitals:   11/09/12 0630 11/09/12 0819 11/09/12 0900 11/09/12 1011  BP: 159/94  151/80 156/91  Pulse: 107  98 103  Temp: 97.7 F (36.5 C)  97.5 F (36.4 C)   TempSrc: Oral  Oral   Resp: 18  26   Height:      Weight:      SpO2: 97% 99% 99%     Intake/Output Summary (Last 24 hours) at 11/09/12 1335 Last data filed at 11/09/12 0900  Gross per 24 hour  Intake 798.33 ml  Output    300 ml  Net 498.33 ml    Exam:   General:  Pt is alert, follows commands appropriately, not in  acute distress  Cardiovascular: Regular rate and rhythm, S1/S2, no murmurs, no rubs, no gallops  Respiratory: Clear to auscultation bilaterally, no wheezing, no crackles, no rhonchi  Abdomen: Soft, non tender, non distended, bowel sounds present, no guarding  Extremities: No edema, pulses DP and PT palpable bilaterally  Neuro: Grossly nonfocal  Data Reviewed: Basic Metabolic Panel:  Recent Labs Lab 11/06/12 1615 11/07/12 0340 11/09/12 0425  NA 143 141 140  K 4.1 4.2 3.7  CL 104 101 101  CO2 30 30 29   GLUCOSE 150* 177* 149*  BUN 21 20 37*  CREATININE 1.07 0.90 1.16*  CALCIUM 9.7 9.4 9.0   Liver Function Tests:  Recent Labs Lab 11/09/12 0425  AST 18  ALT 18  ALKPHOS 66  BILITOT 0.2*  PROT 6.8  ALBUMIN 3.1*   CBC:  Recent Labs Lab 11/06/12 1615 11/07/12 0340 11/09/12 0425  WBC 8.7 8.4 10.0  HGB 10.0* 9.4* 9.5*  HCT 32.1* 31.5* 32.5*  MCV 84.7 84.7 85.8  PLT 275 253 251   Cardiac Enzymes:  Recent Labs Lab 11/06/12 2115 11/07/12 0310 11/07/12 0915  TROPONINI <0.30 <0.30 <0.30   CBG:  Recent Labs Lab 11/08/12 1146 11/08/12 1602 11/08/12 2143 11/09/12 0719 11/09/12 1138  GLUCAP 125* 193* 167* 158* 174*   Scheduled Meds: . albuterol  2.5 mg Nebulization Q4H  . aspirin EC  325 mg Oral Daily  . enoxaparin (LOVENOX) injection  40 mg Subcutaneous Q24H  . ferrous  sulfate  325 mg Oral Q breakfast  . guaiFENesin  600 mg Oral BID  . hydrochlorothiazide  25 mg Oral Daily  . insulin aspart  0-5 Units Subcutaneous QHS  . insulin aspart  0-9 Units Subcutaneous TID WC  . ipratropium  0.5 mg Nebulization Q4H  . losartan  100 mg Oral Daily  . methylPREDNISolone (SOLU-MEDROL) injection  40 mg Intravenous Q12H  . nitroGLYCERIN  1 inch Topical Q6H  . simvastatin  20 mg Oral q1800  . sodium chloride  3 mL Intravenous Q12H   Continuous Infusions: . sodium chloride 20 mL/hr at 11/09/12 1478   Debbora Presto, MD  [facility] Pager [phone]  If 7PM-7AM,  please contact night-coverage [email or web address] Password [email or web address] 11/09/2012, 1:35 PM   LOS: 3 days

## 2012-11-09 NOTE — Evaluation (Signed)
Physical Therapy Evaluation Patient Details Name: [name] MRN: [id number] DOB: [birth year] Today's Date: 11/09/2012 Time: 4098-1191 PT Time Calculation (min): 27 min  PT Assessment / Plan / Recommendation Clinical Impression  67 yo female admitted 10/2312 for increased sob, chest heaviness,cOPD exacerbation. Pt did tolerate ambulating short distance in room, standing to brush teeth.Pt had increased dyspnea but sats > 96% on 2 l. Pt will benefit fromPT while in acute care. Pt declines need to go to SNF.  Pt will benefit from PT while in acute care. Recommend OT consult.    PT Assessment  Patient needs continued PT services    Follow Up Recommendations  Home health PT    Does the patient have the potential to tolerate intense rehabilitation      Barriers to Discharge        Equipment Recommendations  None recommended by PT    Recommendations for Other Services     Frequency Min 3X/week    Precautions / Restrictions Precautions Precaution Comments: O2 dep.   Pertinent Vitals/Pain 96 % after mobility in room.      Mobility  Bed Mobility Bed Mobility: Supine to Sit Supine to Sit: 7: Independent Transfers Transfers: Sit to Stand;Stand to Sit;Stand Pivot Transfers Sit to Stand: 5: Supervision;From bed;With upper extremity assist Stand to Sit: 5: Supervision;To chair/3-in-1;With armrests;With upper extremity assist Stand Pivot Transfers: 6: Modified independent (Device/Increase time) Ambulation/Gait Ambulation/Gait Assistance: 5: Supervision Ambulation Distance (Feet): 20 Feet Ambulation/Gait Assistance Details: at times held onto bed, cabinet Gait Pattern: Step-through pattern Gait velocity: decr General Gait Details: pt would stop and rest while propped on UE's     Exercises     PT Diagnosis: Generalized weakness  PT Problem List: Decreased strength;Decreased activity tolerance;Decreased mobility;Cardiopulmonary status limiting activity PT Treatment  Interventions: DME instruction;Gait training;Stair training;Functional mobility training;Therapeutic activities;Therapeutic exercise;Patient/family education   PT Goals Acute Rehab PT Goals PT Goal Formulation: With patient Time For Goal Achievement: 11/23/12 Potential to Achieve Goals: Good Pt will Ambulate: 51 - 150 feet;with modified independence;with least restrictive assistive device PT Goal: Ambulate - Progress: Goal set today Pt will Go Up / Down Stairs: 6-9 stairs;with supervision;with rail(s) PT Goal: Up/Down Stairs - Progress: Goal set today  Visit Information  Last PT Received On: 11/09/12 Assistance Needed: +1    Subjective Data  Subjective: I don't ususally get this sob.   Prior Functioning  Home Living Lives With: Alone Type of Home: Apartment Home Access: Stairs to enter Entrance Stairs-Number of Steps: 8 Entrance Stairs-Rails: Right Home Layout: One level Bathroom Shower/Tub: Naval architect Equipment: Dan Humphreys - four wheeled Additional Comments: friends will help her get trash out, assists getting groceries into apt. Prior Function Level of Independence: Independent with assistive device(s) Able to Take Stairs?: Yes Driving: Yes Communication Communication: No difficulties    Cognition  Cognition Arousal/Alertness: Awake/alert Behavior During Therapy: WFL for tasks assessed/performed Overall Cognitive Status: Within Functional Limits for tasks assessed    Extremity/Trunk Assessment Right Upper Extremity Assessment RUE ROM/Strength/Tone: [facility] for tasks assessed Left Upper Extremity Assessment LUE ROM/Strength/Tone: WFL for tasks assessed Right Lower Extremity Assessment RLE ROM/Strength/Tone: [facility] for tasks assessed Left Lower Extremity Assessment LLE ROM/Strength/Tone: WFL for tasks assessed Trunk Assessment Trunk Assessment: Normal   Balance    End of Session PT - End of Session Activity Tolerance: Patient tolerated treatment  well Patient left: in chair;with call bell/phone within reach Nurse Communication: Mobility status  GP     Rada Hay 11/09/2012, 1:28 PM  Tresa Endo PT [phone]

## 2012-11-10 LAB — GLUCOSE, CAPILLARY
Glucose-Capillary: 145 mg/dL — ABNORMAL HIGH (ref 70–99)
Glucose-Capillary: 106 mg/dL — ABNORMAL HIGH (ref 70–99)

## 2012-11-10 LAB — CBC
MCV: 85 fL (ref 78.0–100.0)
Platelets: 241 10*3/uL (ref 150–400)
RBC: 3.81 MIL/uL — ABNORMAL LOW (ref 3.87–5.11)
WBC: 8.1 10*3/uL (ref 4.0–10.5)

## 2012-11-10 LAB — BASIC METABOLIC PANEL
CO2: 33 mEq/L — ABNORMAL HIGH (ref 19–32)
Chloride: 100 mEq/L (ref 96–112)
Creatinine, Ser: 1.19 mg/dL — ABNORMAL HIGH (ref 0.50–1.10)
GFR calc Af Amer: 54 mL/min — ABNORMAL LOW (ref >90)
Potassium: 3.3 mEq/L — ABNORMAL LOW (ref 3.5–5.1)

## 2012-11-10 MED ORDER — PREDNISONE 50 MG PO TABS: ORAL_TABLET | ORAL | Status: DC

## 2012-11-10 MED ORDER — GUAIFENESIN ER 600 MG PO TB12: 600.0000 mg | ORAL_TABLET | Freq: Two times a day (BID) | ORAL | Status: DC

## 2012-11-10 MED ORDER — OXYCODONE HCL 5 MG PO TABS: 5.0000 mg | ORAL_TABLET | ORAL | Status: DC | PRN

## 2012-11-10 MED ORDER — AZITHROMYCIN 250 MG PO TABS: 500.0000 mg | ORAL_TABLET | Freq: Every day | ORAL | Status: DC

## 2012-11-10 MED ORDER — HYDRALAZINE HCL 25 MG PO TABS: 25.0000 mg | ORAL_TABLET | Freq: Three times a day (TID) | ORAL | Status: DC

## 2012-11-10 NOTE — Progress Notes (Signed)
Physical Therapy Treatment Patient Details Name: [name] MRN: [id number] DOB: Nov 04, 1945 Today's Date: 11/10/2012 Time: 1478-2956 PT Time Calculation (min): 25 min  PT Assessment / Plan / Recommendation Comments on Treatment Session  Pt ambulated twice in hallway and performed exercises.  Pt to d/c home likely later today.  Discussed taking rest breaks as needed and proper breathing technique. Pt had no further questions/concerns.    Follow Up Recommendations  Home health PT     Does the patient have the potential to tolerate intense rehabilitation     Barriers to Discharge        Equipment Recommendations  None recommended by PT    Recommendations for Other Services    Frequency     Plan Discharge plan remains appropriate;Frequency remains appropriate    Precautions / Restrictions Precautions Precaution Comments: O2 dep.   Pertinent Vitals/Pain Pt remained on 2L O2 [place] and took rest breaks as needed.    Mobility  Bed Mobility Bed Mobility: Supine to Sit Supine to Sit: 7: Independent Transfers Transfers: Sit to Stand;Stand to Sit Sit to Stand: 5: Supervision;From bed;With upper extremity assist Stand to Sit: 5: Supervision;With upper extremity assist;To bed Ambulation/Gait Ambulation/Gait Assistance: 4: Min guard;5: Supervision Ambulation Distance (Feet): 440 Feet (total) Assistive device: 1 person hand held assist Ambulation/Gait Assistance Details: min/guard 140x2 with standing rest break, then supervision 70x2 with standing rest break Gait Pattern: Step-through pattern Gait velocity: decreased General Gait Details: pt would stop and rest while propped on UE's (states she does this at home to rest)    Exercises General Exercises - Lower Extremity Long Arc Quad: AROM;Seated;Both;15 reps Hip ABduction/ADduction: AROM;Standing;15 reps;Both Hip Flexion/Marching: AROM;Seated;Both;15 reps Heel Raises: AROM;Both;15 reps Mini-Sqauts: AROM;Both;15 reps;Standing    PT Diagnosis:    PT Problem List:   PT Treatment Interventions:     PT Goals Acute Rehab PT Goals PT Goal: Ambulate - Progress: Progressing toward goal  Visit Information  Last PT Received On: 11/10/12 Assistance Needed: +1    Subjective Data  Subjective: Oh good, I need to work my legs.  I've been laying too long.   Cognition       Balance     End of Session PT - End of Session Equipment Utilized During Treatment: Oxygen Activity Tolerance: Patient tolerated treatment well Patient left: with call bell/phone within reach;in bed   GP     Jezelle Gullick,KATHrine E 11/10/2012, 1:10 PM Zenovia Jarred, PT, DPT 11/10/2012 Pager: [phone]

## 2012-11-10 NOTE — Discharge Summary (Signed)
Physician Discharge Summary  [name] WUJ:[id number] DOB: 13-Dec-1945 DOA: 11/06/2012  PCP: Bufford Spikes, DO  Admit date: 11/06/2012 Discharge date: 11/10/2012  Recommendations for Outpatient Follow-up:  1. Pt will need to follow up with PCP in 2-3 weeks post discharge 2. Please obtain BMP to evaluate electrolytes and kidney function, potassium level 3. Please note that pt was given K-dur 40 MEQ one dose prior to discharge  4. Please also check CBC to evaluate Hg and Hct levels 5. Please note that pt was discharged on Zithromax to complete the therapy for 5 more days post discharge 6. Pt was also discharged in prednisone taper pack over the 5 day period 7. Please re evaluate kidney function and determine if HCTZ and Losartan should be continued based on kidney function tests   Discharge Diagnoses: Acute COPD exacerbation  Principal Problem:   COPD exacerbation Active Problems:   Pericardial effusion   Type II or unspecified type diabetes mellitus with unspecified complication, uncontrolled   Essential hypertension, benign   COPD (chronic obstructive pulmonary disease) with chronic bronchitis   Chest pain  Discharge Condition: Stable  Diet recommendation: Heart healthy diet discussed in details   Brief narrative:  67 y.o. female with O2 dependent COPD who presented to the ED with complaints of worsening SOB and Chest pressure with exertion over the past week and a half. She denies having any fevers or chills, nausea or vomiting.   Principal Problem:  1.COPD Exacerbation- clinically improved and at her baseline this am, maintaining oxygen saturations at target range on 2 L , continue nebulizer albuterol PRN ans scheduled, continue Zithromax, pt was started on Solumedrol initially and has responded well, we were able to transition to prednisone  2. Chest Pain- resolved, no events on tele, continue Aspirin, BP control  3. Pericardial Effusion- Chronic and Stable.  4. DM2-  HbA1C 5.9, SSI coverage, hold metformin for now.  5. HTN- Monitor, continue HCTZ, Losartan, Hydralazine added for better BP control  6. Acute renal failure - unclear etiology, stable this AM, will defer the decision to PCP if LCTZ and Losartan should be continued based on renal function tests   Consultants:  None Procedures/Studies:  Ct Angio Chest Pe W/cm &/or Wo Cm 11/08/2012 --> No evidence of acute pulmonary thromboembolism. Improved small pericardial effusion.  Antibiotics:  Zithromax 5/24 --> 5 more days post discharge   Code Status: Full  Family Communication: Pt at bedside   Discharge Exam: Filed Vitals:   11/10/12 0601  BP: 151/85  Pulse: 89  Temp: 98.3 F (36.8 C)  Resp: 20   Filed Vitals:   11/09/12 2323 11/10/12 0325 11/10/12 0601 11/10/12 0748  BP:   151/85   Pulse:   89   Temp:   98.3 F (36.8 C)   TempSrc:   Oral   Resp:   20   Height:      Weight:      SpO2: 96% 96% 100% 97%    General: Pt is alert, follows commands appropriately, not in acute distress Cardiovascular: Regular rate and rhythm, S1/S2 +, no murmurs, no rubs, no gallops Respiratory: Clear to auscultation bilaterally, no wheezing, no crackles, no rhonchi Abdominal: Soft, non tender, non distended, bowel sounds +, no guarding Extremities: no edema, no cyanosis, pulses palpable bilaterally DP and PT Neuro: Grossly nonfocal  Discharge Instructions  Discharge Orders   Future Appointments Provider Department Dept Phone   12/31/2012 11:00 AM Kermit Balo, DO [facility] [phone]   Future  Orders Complete By Expires     Diet - low sodium heart healthy  As directed     Increase activity slowly  As directed         Medication List    TAKE these medications       albuterol (2.5 MG/3ML) 0.083% nebulizer solution  Commonly known as:  PROVENTIL  Take 2.5 mg by nebulization every 6 (six) hours as needed for shortness of breath.     PROAIR HFA 108 (90 BASE) MCG/ACT inhaler   Generic drug:  albuterol  Inhale 2 puffs into the lungs every 4 (four) hours as needed for shortness of breath.     azithromycin 250 MG tablet  Commonly known as:  ZITHROMAX  Take 2 tablets (500 mg total) by mouth daily.     budesonide-formoterol 80-4.5 MCG/ACT inhaler  Commonly known as:  SYMBICORT  Inhale 2 puffs into the lungs 2 (two) times daily.     guaiFENesin 600 MG 12 hr tablet  Commonly known as:  MUCINEX  Take 1 tablet (600 mg total) by mouth 2 (two) times daily.     hydrALAZINE 25 MG tablet  Commonly known as:  APRESOLINE  Take 1 tablet (25 mg total) by mouth every 8 (eight) hours.     hydrochlorothiazide 25 MG tablet  Commonly known as:  HYDRODIURIL  Take 1 tablet (25 mg total) by mouth daily.     indomethacin 25 MG capsule  Commonly known as:  INDOCIN  Take 25 mg by mouth 3 (three) times daily as needed (for gout).     Iron 325 (65 FE) MG Tabs  Take 1 tablet by mouth daily.     losartan 100 MG tablet  Commonly known as:  COZAAR  Take 1 tablet (100 mg total) by mouth daily.     lovastatin 40 MG tablet  Commonly known as:  MEVACOR  Take 40 mg by mouth at bedtime.     metFORMIN 500 MG tablet  Commonly known as:  GLUCOPHAGE  Take 500 mg by mouth 2 (two) times daily with a meal.     multivitamin with minerals Tabs  Take 1 tablet by mouth daily.     oxyCODONE 5 MG immediate release tablet  Commonly known as:  Oxy IR/ROXICODONE  Take 1 tablet (5 mg total) by mouth every 4 (four) hours as needed.     OXYGEN-HELIUM IN  Inhale 2 L/min into the lungs daily. 24/7     predniSONE 50 MG tablet  Commonly known as:  DELTASONE  Take 50 mg tablet today and taper down by 10 mg daily until completed.     tiotropium 18 MCG inhalation capsule  Commonly known as:  SPIRIVA  Place 18 mcg into inhaler and inhale daily.           Follow-up Information   Follow up with REED, [name], DO In 3 weeks.   Contact information:   [address] [place]  [address] [phone]        The results of significant diagnostics from this hospitalization (including imaging, microbiology, ancillary and laboratory) are listed below for reference.     Microbiology: No results found for this or any previous visit (from the past 240 hour(s)).   Labs: Basic Metabolic Panel:  Recent Labs Lab 11/06/12 1615 11/07/12 0340 11/09/12 0425 11/10/12 0517  NA 143 141 140 140  K 4.1 4.2 3.7 3.3*  CL 104 101 101 100  CO2 30 30 29  33*  GLUCOSE  150* 177* 149* 108*  BUN 21 20 37* 37*  CREATININE 1.07 0.90 1.16* 1.19*  CALCIUM 9.7 9.4 9.0 8.9   Liver Function Tests:  Recent Labs Lab 11/09/12 0425  AST 18  ALT 18  ALKPHOS 66  BILITOT 0.2*  PROT 6.8  ALBUMIN 3.1*   CBC:  Recent Labs Lab 11/06/12 1615 11/07/12 0340 11/09/12 0425 11/10/12 0517  WBC 8.7 8.4 10.0 8.1  HGB 10.0* 9.4* 9.5* 9.7*  HCT 32.1* 31.5* 32.5* 32.4*  MCV 84.7 84.7 85.8 85.0  PLT 275 253 251 241   Cardiac Enzymes:  Recent Labs Lab 11/06/12 2115 11/07/12 0310 11/07/12 0915  TROPONINI <0.30 <0.30 <0.30   BNP: BNP (last 3 results)  Recent Labs  11/06/12 1615  PROBNP 182.3*   CBG:  Recent Labs Lab 11/09/12 0719 11/09/12 1138 11/09/12 1656 11/09/12 2226 11/10/12 0747  GLUCAP 158* 174* 158* 145* 106*   SIGNED: Time coordinating discharge: Over 30 minutes  Debbora Presto, MD  [facility] 11/10/2012, 11:41 AM Pager [phone]  If 7PM-7AM, please contact night-coverage [email or web address] Password [email or web address]

## 2012-11-11 DIAGNOSIS — J9600 Acute respiratory failure, unspecified whether with hypoxia or hypercapnia: Secondary | ICD-10-CM

## 2012-11-11 DIAGNOSIS — J441 Chronic obstructive pulmonary disease with (acute) exacerbation: Secondary | ICD-10-CM

## 2012-11-11 DIAGNOSIS — E1149 Type 2 diabetes mellitus with other diabetic neurological complication: Secondary | ICD-10-CM

## 2012-11-11 DIAGNOSIS — I319 Disease of pericardium, unspecified: Secondary | ICD-10-CM

## 2012-11-13 ENCOUNTER — Encounter: Payer: Self-pay | Admitting: *Deleted

## 2012-11-16 ENCOUNTER — Encounter: Payer: Self-pay | Admitting: Internal Medicine

## 2012-11-16 ENCOUNTER — Ambulatory Visit (INDEPENDENT_AMBULATORY_CARE_PROVIDER_SITE_OTHER): Payer: Medicare Other | Admitting: Internal Medicine

## 2012-11-16 VITALS — BP 132/82 | HR 90 | Temp 98.1°F | Ht 62.5 in | Wt 196.8 lb

## 2012-11-16 VITALS — BP 118/60 | HR 86 | Temp 98.0°F | Resp 20 | Ht 63.5 in | Wt 195.0 lb

## 2012-11-16 DIAGNOSIS — R079 Chest pain, unspecified: Secondary | ICD-10-CM

## 2012-11-16 DIAGNOSIS — J4489 Other specified chronic obstructive pulmonary disease: Secondary | ICD-10-CM

## 2012-11-16 DIAGNOSIS — I10 Essential (primary) hypertension: Secondary | ICD-10-CM | POA: Insufficient documentation

## 2012-11-16 DIAGNOSIS — J449 Chronic obstructive pulmonary disease, unspecified: Secondary | ICD-10-CM

## 2012-11-16 DIAGNOSIS — J441 Chronic obstructive pulmonary disease with (acute) exacerbation: Secondary | ICD-10-CM

## 2012-11-16 MED ORDER — BUDESONIDE-FORMOTEROL FUMARATE 80-4.5 MCG/ACT IN AERO: 2.0000 | INHALATION_SPRAY | Freq: Two times a day (BID) | RESPIRATORY_TRACT | Status: DC

## 2012-11-16 NOTE — Progress Notes (Signed)
Subjective:    Patient ID: Chip Boer, female    DOB: 05-22-1946, 67 y.o.   MRN: [id number] REED, [name], DO  Is pcp   HPI 1. Severe COPD -O2 depenedent (advair causes epistaxis)  - MM phenotype Nov 2012  - Spirometry Nov 2012: Fev1 0.85L/45%, Ratio - CAT score 10 - May 2013 2. Recurrent AECOPD -     - Oct 2011, JAn 2012, March 2012 and August 2012, Sept 2013 3. Small but increasing pericardial effusion on CT jan and march 2012. Moderate Rt atrial collapse in August 2012 - Dr Elease Hashimoto since Aug 2012  - Improved small pericardial effusion 11/08/2012 4. Isolated social situation.  5. Ex smoker  6.hx of snoring and excess daytime somnolence but not using cpap; no dx of OSA.  7. Body mass index is 33.98 kg/(m^2). - on 08/01/2011  - Body mass index is 34.33 kg/(m^2). - 11/04/2011 - Body mass index is 36.43 kg/(m^2). on 02/28/2012  - Body mass index is 35.4 kg/(m^2). on 11/16/2012  8. lung cancer screening  - CT angiogram chest done for pulmonary embolism 11/08/2012: No evidence of lung cancer        OV 02/28/2012  Followup COPD. Was doing baseline and well. A weeik or so ago develiped insidious onset of worsening  cough and increase white phlegm; now improving to nearly resolved. Denies change in dyspnea or fever, or colored sputum. CAT score shows doubling of symptoms to scoe 19 (was 10 at last visit). Otherwise well  REC   #COPD  Your severe copd is in the end of a mild flare right now  Glad you finished rehab  Continue spiriva, symbicort and oxygen  Take doxycycline 100mg  po twice daily x 5 days; take after meals and avoid sunlight  Take prednisone 40 mg daily x 2 days, then 20mg  daily x 2 days, then 10mg  daily x 2 days, and stop  Call anytime if you feel symptoms are getting worse or any change  At followup do CAT SCORE  #immunization  - have flu shot today  - nurse will call REED, [name], DO office and check status of your pneumonia vaccine  #Followup  3 months or  sooner if needed   OV 11/16/2012  Hospitalized 11/06/2012 through 11/10/2012 with COPD exacerbation  and acute renal failure (creatinine rose from 0.9 mg percent 1.2 mg percent]. CT angiogram chest ruled out pulmonary embolism under [facility]. She now presents for office followup. She's feeling much better. She ran out of Symbicort couple weeks ago. Currently maintained only on Spiriva. Though better because of recent hospitalization and lack of Symbicort ,  COPD cat score is worse than her baseline and is 21 currently. She is complaining of significant cost of $42 per month for each inhaler of Spiriva and Symbicort. Currently she has many months supply of her Spiriva but has no supply of Symbicort. Symbicort helps her and she is unwilling to switch to nebulizer because of the gain from Symbicort and also because of significant supply of Spiriva that is present at this point in time. She maintains compliance with treatment  Of note, when she was discharged from the hospital she was prescribed azithromycin and prednisone taper but she did not fill these medications and has not started this.   CAT SCORE 11/04/11 02/28/2012  11/16/2012 Post hospitalization for COPD flare   Never Cough -> Cough all the time 0 3 0  No phlegm in chest -> Chest is full of phlegm 0 2 2  No chest tightness -> Chest feels very tight 3 1 2   No dyspnea for 1 flight stairs/hill -> Very dyspneic for 1 flight of stairs 5 4 5   No limitations for ADL at home -> Very limited with ADL at home 0 2 5  Confident leaving home -> Not at all confident leaving home 0 1 3  Sleep soundly -> Do not sleep soundly because of lung condition 0 3 3  Lots of Energy -> No energy at all 2 3 1   TOTAL Score (max 40)  10 19 21    Past, Family, Social reviewed: Recent hospitalization for COPD exacerbation and mild renal insufficiency. Due to see her primary care physician today   Review of Systems  Constitutional: Negative for fever and  unexpected weight change.  HENT: Negative for ear pain, nosebleeds, congestion, sore throat, rhinorrhea, sneezing, trouble swallowing, dental problem, postnasal drip and sinus pressure.   Eyes: Negative for redness and itching.  Respiratory: Positive for shortness of breath. Negative for cough, chest tightness and wheezing.   Cardiovascular: Negative for palpitations and leg swelling.  Gastrointestinal: Negative for nausea and vomiting.  Genitourinary: Negative for dysuria.  Musculoskeletal: Negative for joint swelling.  Skin: Negative for rash.  Neurological: Negative for headaches.  Hematological: Does not bruise/bleed easily.  Psychiatric/Behavioral: Negative for dysphoric mood. The patient is not nervous/anxious.    Current outpatient prescriptions:albuterol (PROAIR HFA) 108 (90 BASE) MCG/ACT inhaler, Inhale 2 puffs into the lungs every 4 (four) hours as needed for shortness of breath. , Disp: , Rfl: ;  albuterol (PROVENTIL) (2.5 MG/3ML) 0.083% nebulizer solution, Take 2.5 mg by nebulization every 6 (six) hours as needed for shortness of breath. , Disp: , Rfl: ;  Ferrous Sulfate (IRON) 325 (65 FE) MG TABS, Take 1 tablet by mouth daily.  , Disp: , Rfl:  guaiFENesin (MUCINEX) 600 MG 12 hr tablet, Take 1 tablet (600 mg total) by mouth 2 (two) times daily., Disp: 40 tablet, Rfl: 0;  hydrALAZINE (APRESOLINE) 25 MG tablet, Take 1 tablet (25 mg total) by mouth every 8 (eight) hours., Disp: 90 tablet, Rfl: 3;  hydrochlorothiazide (HYDRODIURIL) 25 MG tablet, Take 1 tablet (25 mg total) by mouth daily., Disp: 30 tablet, Rfl: 3 indomethacin (INDOCIN) 25 MG capsule, Take 25 mg by mouth 3 (three) times daily as needed (for gout)., Disp: , Rfl: ;  losartan (COZAAR) 100 MG tablet, Take 1 tablet (100 mg total) by mouth daily., Disp: 90 tablet, Rfl: 3;  lovastatin (MEVACOR) 40 MG tablet, Take 40 mg by mouth at bedtime.  , Disp: , Rfl: ;  metFORMIN (GLUCOPHAGE) 500 MG tablet, Take 500 mg by mouth 2 (two) times  daily with a meal., Disp: , Rfl:  Multiple Vitamin (MULTIVITAMIN WITH MINERALS) TABS, Take 1 tablet by mouth daily., Disp: , Rfl: ;  oxyCODONE (OXY IR/ROXICODONE) 5 MG immediate release tablet, Take 1 tablet (5 mg total) by mouth every 4 (four) hours as needed., Disp: 45 tablet, Rfl: 0;  OXYGEN-HELIUM IN, Inhale 2 L/min into the lungs daily. 24/7, Disp: , Rfl: ;  tiotropium (SPIRIVA) 18 MCG inhalation capsule, Place 18 mcg into inhaler and inhale daily., Disp: , Rfl:  azithromycin (ZITHROMAX) 250 MG tablet, Take 2 tablets (500 mg total) by mouth daily., Disp: 5 tablet, Rfl: 0;  budesonide-formoterol (SYMBICORT) 80-4.5 MCG/ACT inhaler, Inhale 2 puffs into the lungs 2 (two) times daily., Disp: 1 Inhaler, Rfl: 3;  predniSONE (DELTASONE) 50 MG tablet, Take 50 mg tablet today and taper down by 10 mg  daily until completed., Disp: 15 tablet, Rfl: 0 [DISCONTINUED] albuterol-ipratropium (COMBIVENT) 18-103 MCG/ACT inhaler, Inhale 2 puffs into the lungs every 6 (six) hours as needed. For shortness of breath., Disp: , Rfl:      Objective:   Physical Exam  Vitals reviewed. Constitutional: She is oriented to person, place, and time. She appears well-developed and well-nourished. No distress.  Chronically looking  Female Has walker with oxygen on  HENT:  Head: Normocephalic and atraumatic.  Right Ear: External ear normal.  Left Ear: External ear normal.  Mouth/Throat: Oropharynx is clear and moist. No oropharyngeal exudate.  Eyes: Conjunctivae and EOM are normal. Pupils are equal, round, and reactive to light. Right eye exhibits no discharge. Left eye exhibits no discharge. No scleral icterus.  Neck: Normal range of motion. Neck supple. No JVD present. No tracheal deviation present. No thyromegaly present.  Cardiovascular: Normal rate, regular rhythm, normal heart sounds and intact distal pulses.  Exam reveals no gallop and no friction rub.   No murmur heard. Pulmonary/Chest: Effort normal and breath sounds  normal. No respiratory distress. She has no wheezes. She has no rales. She exhibits no tenderness.  Abdominal: Soft. Bowel sounds are normal. She exhibits no distension and no mass. There is no tenderness. There is no rebound and no guarding.  Musculoskeletal: Normal range of motion. She exhibits no edema and no tenderness.  Lymphadenopathy:    She has no cervical adenopathy.  Neurological: She is alert and oriented to person, place, and time. She has normal reflexes. No cranial nerve deficit. She exhibits normal muscle tone. Coordination normal.  Skin: Skin is warm and dry. No rash noted. She is not diaphoretic. No erythema. No pallor.  Psychiatric: She has a normal mood and affect. Her behavior is normal. Judgment and thought content normal.   Filed Vitals:   11/16/12 0950  BP: 132/82  Pulse: 90  Temp: 98.1 F (36.7 C)  TempSrc: Oral  Height: 5' 2.5" (1.588 m)  Weight: 196 lb 12.8 oz (89.268 kg)  SpO2: 87%               Assessment & Plan:

## 2012-11-16 NOTE — Patient Instructions (Addendum)
#  COPD - Glad you are better after recent flareup - Please start and complete azithromycin and prednisone taper as instructed by the hospital - In the future for all flareups please learn to recognize them immediately and call our office for help. The strategy will l avoid potentially an admission - Continue Spiriva 1 puff daily - Restart Symbicort 80/4.5, 2 puff 2 times daily; please demonstrate technique  - Will help you with samples as much as possible - Use  albuterol as needed - In the future, when Spiriva runs out we can look at switching to all nebulizer treatment to help with cost - Continue oxygen as before - Next visit will talk N-acetyl cystein  #Recent renal insufficiency at the hospital - Please make sure primary care physician REED, [name], DO check blood work to check your kidney function  #Followup - 4 months with COPD cat score - Flu shot in the fall

## 2012-11-16 NOTE — Progress Notes (Signed)
Patient ID: [name], female   DOB: [birth year], 67 y.o.   MRN: [id number] Code Status: full code  Allergies  Allergen Reactions  . Lisinopril Swelling    Angioedema   . Advair Diskus (Fluticasone-Salmeterol) Other (See Comments)    Nose bleeds.   . Penicillins     hives    Chief Complaint  Patient presents with  . Hospitalization Follow-up    excerbation for COPD    HPI: Patient is a 67 y.o. AA female seen in the office today for hospital f/u after COPD exacerbation. Was feeling lethargic and like she could vomit.  Got to the point where she had chest pressure like someone pushing on it.  Did a neb--breathing got worse.  Called 911 and went to the hospital.  Micah Flesher to [facility] this time.  Was there for 4 days.  Would not let her out of the bed and alarms went off when she had to get up to urinate.  Had therapy right before sent home--walked down hall, then sent home.  Tried to send her home by bus, but she can't walk that far.  Pt had to call cab herself and it was raining out.  Had no assistive device with her either.  Has been home 6 days now.    Said pharmacist would not fill prednisone with zithromax.  Just ready today.  So did not take this.    Says she does have spiriva now.  Dr. Marchelle Gearing gave her symbicort samples yesterday.  He is going to do her medication assistance for symbicort.  Could not afford symbicort.    Feels better than she did even when she was here the last time despite not picking up the prednisone or zithromax due to pharmacy not wanting to initially give them.    Review of Systems:  Review of Systems  Constitutional: Positive for malaise/fatigue. Negative for fever and chills.       Malaise improved  HENT: Negative for congestion.   Eyes: Negative for blurred vision.  Respiratory: Positive for cough, sputum production and shortness of breath.        All improved w/o having taken zpak or prednisone  Cardiovascular: Negative for chest pain,  palpitations, orthopnea and PND.  Gastrointestinal: Negative for heartburn and constipation.  Genitourinary: Negative for dysuria.  Musculoskeletal: Negative for falls.  Neurological: Negative for dizziness and focal weakness.  Psychiatric/Behavioral: Negative for memory loss.     Past Medical History  Diagnosis Date  . Asthma   . COPD (chronic obstructive pulmonary disease)   . Hypertension   . Diabetes mellitus 09/13/2010    glucophage  . Gout   . Oxygen dependent     pt uses O2   . Pericardial effusion 08/2011    small residual on echo  . Hyperlipidemia   . Type II or unspecified type diabetes mellitus with neurological manifestations, uncontrolled(250.62)   . Personal history of noncompliance with medical treatment, presenting hazards to health   . Unspecified disease of pericardium   . Hemorrhage of rectum and anus   . Panic disorder without agoraphobia   . Depressive disorder, not elsewhere classified   . Edema   . Anxiety state, unspecified   . Unspecified hereditary and idiopathic peripheral neuropathy   . Type II or unspecified type diabetes mellitus without mention of complication, not stated as uncontrolled   . Other and unspecified hyperlipidemia   . Obesity, unspecified   . Unspecified tinnitus   . Unspecified essential hypertension   .  Pneumonia, organism unspecified   . Chronic obstructive asthma, unspecified   . Acute and chronic respiratory failure   . Urinary frequency   . Urgency of urination    Past Surgical History  Procedure Laterality Date  . Breast biopsy      both breasts  . Total abdominal hysterectomy     Social History:   reports that she quit smoking about 8 years ago. Her smoking use included Cigarettes. She has a 30 pack-year smoking history. She has never used smokeless tobacco. She reports that she drinks about 1.0 ounces of alcohol per week. She reports that she does not use illicit drugs.  Family History  Problem Relation Age of  Onset  . COPD Mother   . COPD Sister   . Hypertension Sister   . Cancer Father   . Asthma Father   . Diabetes Other     3 maternal aunts  . Prostate cancer Paternal Uncle     Medications: Patient's Medications  New Prescriptions   No medications on file  Previous Medications   ALBUTEROL (PROAIR HFA) 108 (90 BASE) MCG/ACT INHALER    Inhale 2 puffs into the lungs every 4 (four) hours as needed for shortness of breath.    ALBUTEROL (PROVENTIL) (2.5 MG/3ML) 0.083% NEBULIZER SOLUTION    Take 2.5 mg by nebulization every 6 (six) hours as needed for shortness of breath.    AZITHROMYCIN (ZITHROMAX) 250 MG TABLET    Take 2 tablets (500 mg total) by mouth daily.   BUDESONIDE-FORMOTEROL (SYMBICORT) 80-4.5 MCG/ACT INHALER    Inhale 2 puffs into the lungs 2 (two) times daily.   FERROUS SULFATE (IRON) 325 (65 FE) MG TABS    Take 1 tablet by mouth daily.     GUAIFENESIN (MUCINEX) 600 MG 12 HR TABLET    Take 1 tablet (600 mg total) by mouth 2 (two) times daily.   HYDRALAZINE (APRESOLINE) 25 MG TABLET    Take 1 tablet (25 mg total) by mouth every 8 (eight) hours.   HYDROCHLOROTHIAZIDE (HYDRODIURIL) 25 MG TABLET    Take 1 tablet (25 mg total) by mouth daily.   INDOMETHACIN (INDOCIN) 25 MG CAPSULE    Take 25 mg by mouth 3 (three) times daily as needed (for gout).   LOSARTAN (COZAAR) 100 MG TABLET    Take 1 tablet (100 mg total) by mouth daily.   LOVASTATIN (MEVACOR) 40 MG TABLET    Take 40 mg by mouth at bedtime.     METFORMIN (GLUCOPHAGE) 500 MG TABLET    Take 500 mg by mouth 2 (two) times daily with a meal.   MULTIPLE VITAMIN (MULTIVITAMIN WITH MINERALS) TABS    Take 1 tablet by mouth daily.   OXYCODONE (OXY IR/ROXICODONE) 5 MG IMMEDIATE RELEASE TABLET    Take 1 tablet (5 mg total) by mouth every 4 (four) hours as needed.   OXYGEN-HELIUM IN    Inhale 2 L/min into the lungs daily. 24/7   PREDNISONE (DELTASONE) 50 MG TABLET    Take 50 mg tablet today and taper down by 10 mg daily until completed.    TIOTROPIUM (SPIRIVA) 18 MCG INHALATION CAPSULE    Place 18 mcg into inhaler and inhale daily.  Modified Medications   No medications on file  Discontinued Medications   No medications on file     Physical Exam: Filed Vitals:   11/16/12 1518  BP: 118/60  Pulse: 86  Temp: 98 F (36.7 C)  Resp: 20  Height: 5' 3.5" (  1.613 m)  Weight: 195 lb (88.451 kg)  SpO2: 90%  Physical Exam  Constitutional: She is oriented to person, place, and time.  Obese, AA female  HENT:  Head: Normocephalic and atraumatic.  Neck: No JVD present.  Cardiovascular: Normal rate, regular rhythm, normal heart sounds and intact distal pulses.   Pulmonary/Chest: No respiratory distress. She has no wheezes. She has no rales.  Decreased breath sounds  Abdominal: Soft. Bowel sounds are normal. She exhibits no distension and no mass. There is no tenderness. A hernia is present.  Musculoskeletal: Normal range of motion.  Ambulates with rollator walker and oxygen  Neurological: She is alert and oriented to person, place, and time.  Skin: Skin is warm and dry.  Psychiatric: She has a normal mood and affect.       Labs reviewed: Basic Metabolic Panel:  Recent Labs  78/29/56 0340 11/09/12 0425 11/10/12 0517  NA 141 140 140  K 4.2 3.7 3.3*  CL 101 101 100  CO2 30 29 33*  GLUCOSE 177* 149* 108*  BUN 20 37* 37*  CREATININE 0.90 1.16* 1.19*  CALCIUM 9.4 9.0 8.9   Liver Function Tests:  Recent Labs  11/09/12 0425  AST 18  ALT 18  ALKPHOS 66  BILITOT 0.2*  PROT 6.8  ALBUMIN 3.1*  CBC:  Recent Labs  09/24/12 1433  11/07/12 0340 11/09/12 0425 11/10/12 0517  WBC 6.0  < > 8.4 10.0 8.1  NEUTROABS 3.4  --   --   --   --   HGB 9.4*  < > 9.4* 9.5* 9.7*  HCT 31.8*  < > 31.5* 32.5* 32.4*  MCV 85  < > 84.7 85.8 85.0  PLT  --   < > 253 251 241  < > = values in this interval not displayed.  Past Procedures: Reviewed CTs and xrays from hospital.    Assessment/Plan COPD exacerbation Had another  exacerbation.  Suspect due to running out of symbicort.  Going to get through a prescription assistance program now.  Got some samples.  Also has her spiriva at this point.  Never was able to fill prednisone and zpak at this point and has continued to improve so advised she should not fill at this point (missed tapering of her steroids b/c pharmacist would not fill script for her).    Chest pain Resolved.  Was in context of her COPD exacerbation.  Was ruled out for PE.    Hypertension BP under good control since addition of hydralazine.  Will f/u bmp today.   Labs/tests ordered:  Bmp today Keep f/u in July.

## 2012-11-16 NOTE — Assessment & Plan Note (Signed)
#  COPD - Glad you are better after recent flareup - Please start and complete azithromycin and prednisone taper as instructed by the hospital - In the future for all flareups please learn to recognize them immediately and call our office for help. The strategy will l avoid potentially an admission - Continue Spiriva 1 puff daily - Restart Symbicort 80/4.5, 2 puff 2 times daily; please demonstrate technique  - Will help you with samples as much as possible - Use  albuterol as needed - In the future, when Spiriva runs out we can look at switching to all nebulizer treatment to help with cost - Continue oxygen as before - Next visit will talk N-acetyl cystein  #Followup - 4 months with COPD cat score - Flu shot in the fall   > 50% of this > 25 min visit spent in face to face counseling (15 min visit converted to 25 min)

## 2012-11-16 NOTE — Assessment & Plan Note (Signed)
Had another exacerbation.  Suspect due to running out of symbicort.  Going to get through a prescription assistance program now.  Got some samples.  Also has her spiriva at this point.  Never was able to fill prednisone and zpak at this point and has continued to improve so advised she should not fill at this point (missed tapering of her steroids b/c pharmacist would not fill script for her).

## 2012-11-16 NOTE — Assessment & Plan Note (Signed)
Resolved.  Was in context of her COPD exacerbation.  Was ruled out for PE.

## 2012-11-16 NOTE — Assessment & Plan Note (Signed)
BP under good control since addition of hydralazine.  Will f/u bmp today.

## 2012-11-17 ENCOUNTER — Inpatient Hospital Stay: Payer: Medicare Other | Admitting: Adult Health

## 2012-11-17 LAB — BASIC METABOLIC PANEL
BUN/Creatinine Ratio: 18 (ref 11–26)
BUN: 19 mg/dL (ref 8–27)
CO2: 33 mmol/L — ABNORMAL HIGH (ref 19–28)
Calcium: 8.9 mg/dL (ref 8.6–10.2)
Chloride: 98 mmol/L (ref 97–108)
Creatinine, Ser: 1.03 mg/dL — ABNORMAL HIGH (ref 0.57–1.00)
GFR calc Af Amer: 65 mL/min/{1.73_m2} (ref >59)
GFR calc non Af Amer: 56 mL/min/{1.73_m2} — ABNORMAL LOW (ref >59)
Glucose: 73 mg/dL (ref 65–99)
Potassium: 4 mmol/L (ref 3.5–5.2)
Sodium: 142 mmol/L (ref 134–144)

## 2012-11-18 ENCOUNTER — Telehealth: Payer: Self-pay | Admitting: Internal Medicine

## 2012-11-18 NOTE — Telephone Encounter (Signed)
No need for message per patient.  Patient was upset about prescription assistance forms.

## 2012-11-19 ENCOUNTER — Telehealth: Payer: Self-pay | Admitting: Internal Medicine

## 2012-11-19 NOTE — Telephone Encounter (Signed)
lmomtcb for pt 

## 2012-11-20 NOTE — Telephone Encounter (Signed)
[email or web address]. Jennifer Castillo, CMA  

## 2012-11-23 NOTE — Telephone Encounter (Signed)
[email or web address]. Jennifer Castillo, CMA  

## 2012-11-24 NOTE — Telephone Encounter (Signed)
lmomtcb x4. Will sign off per triage protocol

## 2012-12-07 ENCOUNTER — Telehealth: Payer: Self-pay | Admitting: Internal Medicine

## 2012-12-07 NOTE — Telephone Encounter (Signed)
[id number].Jennifer Castillo, CMA  

## 2012-12-09 NOTE — Telephone Encounter (Signed)
LMTCB

## 2012-12-10 NOTE — Telephone Encounter (Signed)
ATC patient, no answer LMOMTCB 

## 2012-12-11 NOTE — Telephone Encounter (Signed)
LMOMTCB

## 2012-12-14 NOTE — Telephone Encounter (Signed)
Lm advising if anything further was needed tcb. Will sign off message per triage protocol

## 2012-12-23 ENCOUNTER — Telehealth: Payer: Self-pay | Admitting: Internal Medicine

## 2012-12-23 NOTE — Telephone Encounter (Signed)
I spoke with the pt and she states 2 days after her last OV she dropped off some patient assistance forms to the front desk and wanted an update on them. I cannot locate these forms and a prescription has not been printed for the medication either. I advised the pt that I have not located these forms but that I would be happy to send her another copy and when she brings them by to have attention jennifer on them so they can come to me. Pt states no need to send her because she has a copy at home and she will mail this copy. I advised the pt that it would be best to bring them by the office and have them given directly to me but she states she does not want to come out. I asked that if she mails them to please put attention jennifer on the front. Pt states understanding. I verified that the pt does have samples of the medication to use in the meantime. Carron Curie, CMA

## 2012-12-31 ENCOUNTER — Ambulatory Visit (INDEPENDENT_AMBULATORY_CARE_PROVIDER_SITE_OTHER): Payer: Medicare Other | Admitting: Internal Medicine

## 2012-12-31 ENCOUNTER — Encounter: Payer: Self-pay | Admitting: Internal Medicine

## 2012-12-31 VITALS — BP 150/88 | HR 63 | Temp 98.3°F | Resp 14 | Ht 63.5 in | Wt 197.0 lb

## 2012-12-31 DIAGNOSIS — J449 Chronic obstructive pulmonary disease, unspecified: Secondary | ICD-10-CM

## 2012-12-31 DIAGNOSIS — Z86010 Personal history of colonic polyps: Secondary | ICD-10-CM

## 2012-12-31 DIAGNOSIS — J4489 Other specified chronic obstructive pulmonary disease: Secondary | ICD-10-CM

## 2012-12-31 DIAGNOSIS — E1165 Type 2 diabetes mellitus with hyperglycemia: Secondary | ICD-10-CM

## 2012-12-31 DIAGNOSIS — E669 Obesity, unspecified: Secondary | ICD-10-CM

## 2012-12-31 DIAGNOSIS — R600 Localized edema: Secondary | ICD-10-CM

## 2012-12-31 DIAGNOSIS — R609 Edema, unspecified: Secondary | ICD-10-CM

## 2012-12-31 DIAGNOSIS — L723 Sebaceous cyst: Secondary | ICD-10-CM

## 2012-12-31 DIAGNOSIS — IMO0002 Reserved for concepts with insufficient information to code with codable children: Secondary | ICD-10-CM

## 2012-12-31 DIAGNOSIS — I10 Essential (primary) hypertension: Secondary | ICD-10-CM

## 2012-12-31 DIAGNOSIS — D509 Iron deficiency anemia, unspecified: Secondary | ICD-10-CM

## 2012-12-31 DIAGNOSIS — K921 Melena: Secondary | ICD-10-CM

## 2012-12-31 DIAGNOSIS — E118 Type 2 diabetes mellitus with unspecified complications: Secondary | ICD-10-CM

## 2012-12-31 MED ORDER — ALBUTEROL SULFATE HFA 108 (90 BASE) MCG/ACT IN AERS: 2.0000 | INHALATION_SPRAY | RESPIRATORY_TRACT | Status: DC | PRN

## 2012-12-31 MED ORDER — METFORMIN HCL 500 MG PO TABS: 500.0000 mg | ORAL_TABLET | Freq: Two times a day (BID) | ORAL | Status: DC

## 2012-12-31 NOTE — Assessment & Plan Note (Signed)
Suspect diverticular bleeding as cause of her hematochezia and melena she had a few weeks ago for a week after eating crushed red pepper.  Concerning, however.  See melena a/p.

## 2012-12-31 NOTE — Assessment & Plan Note (Signed)
F/u cbc today due to bloody stools.

## 2012-12-31 NOTE — Assessment & Plan Note (Signed)
At upper limits of satisfactory today.  She is out of breath sitting.  Continue current regimen

## 2012-12-31 NOTE — Assessment & Plan Note (Signed)
Control has been improving.  hba1c 5.9 two mos ago.  Not yet due for recheck.  On statin and arb.

## 2012-12-31 NOTE — Assessment & Plan Note (Signed)
Has all of her meds now except her rescue inhaler which I reordered today (faxed to her pharmacy).  She is using all of them as directed at this time.

## 2012-12-31 NOTE — Progress Notes (Signed)
Patient ID: [name], female   DOB: March 31, 1946, 67 y.o.   MRN: [id number] Location:  [facility]  Allergies  Allergen Reactions  . Lisinopril Swelling    Angioedema   . Advair Diskus (Fluticasone-Salmeterol) Other (See Comments)    Nose bleeds.   . Penicillins     hives    Chief Complaint  Patient presents with  . Medical Managment of Chronic Issues    HPI: Patient is a 67 y.o. AA female seen in the office today for med mgt chronic diseases.    Had pizza three weeks ago with crushed red pepper.  Immediately got bloody stools that smelled bad.  Was dark burgundy stool.  Water became red when she went--lasted a week until it cleared.  First 2 days were the worst.  She was scared to death.  Thinks she was told not to eat crushed red pepper flakes after her cscope.  Had diverticulosis.  Has sworn off of crushed red pepper now.  Had no abdominal pain.  Initially had diarrhea.  Was loose for 2-3 days, then firmed up.    Has lower rash on lower part of spine--about a week ago began.  Had been itchy.  Now just feels it once in a while.  Does not hurt or burn.    Breathing:  Has felt more tired than usual.  Does not cover as much space.  Does not do anything so not physically exhausted, just hard to breathe.  Is using albuterol every four hours.  Does not have her rescue inhaler.  Has symbicort.  Dr. Marchelle Gearing gave her form to do for help with drugs.  No script was provided.  She mailed it to the office, but then they couldn't find it when she called about it.  Has spiriva.    Review of Systems:  Review of Systems  Constitutional: Negative for fever and malaise/fatigue.  HENT: Negative for congestion.   Eyes: Negative for blurred vision.  Respiratory: Positive for sputum production, shortness of breath and wheezing. Negative for cough.   Cardiovascular: Positive for leg swelling. Negative for chest pain.       Left greater than right   Gastrointestinal: Positive for diarrhea, blood in stool and melena. Negative for heartburn, nausea, vomiting, abdominal pain and constipation.  Genitourinary: Negative for dysuria.  Musculoskeletal: Negative for falls.  Skin: Positive for itching and rash.       Area of low back  Neurological: Negative for dizziness, focal weakness and headaches.  Endo/Heme/Allergies: Does not bruise/bleed easily.  Psychiatric/Behavioral: Negative for depression and memory loss. The patient is nervous/anxious.     Past Medical History  Diagnosis Date  . Asthma   . COPD (chronic obstructive pulmonary disease)   . Hypertension   . Diabetes mellitus 09/13/2010    glucophage  . Gout   . Oxygen dependent     pt uses O2   . Pericardial effusion 08/2011    small residual on echo  . Hyperlipidemia   . Type II or unspecified type diabetes mellitus with neurological manifestations, uncontrolled(250.62)   . Personal history of noncompliance with medical treatment, presenting hazards to health   . Unspecified disease of pericardium   . Hemorrhage of rectum and anus   . Panic disorder without agoraphobia   . Depressive disorder, not elsewhere classified   . Edema   . Anxiety state, unspecified   . Unspecified hereditary and idiopathic peripheral neuropathy   . Type II or  unspecified type diabetes mellitus without mention of complication, not stated as uncontrolled   . Other and unspecified hyperlipidemia   . Obesity, unspecified   . Unspecified tinnitus   . Unspecified essential hypertension   . Pneumonia, organism unspecified   . Chronic obstructive asthma, unspecified   . Acute and chronic respiratory failure   . Urinary frequency   . Urgency of urination     Past Surgical History  Procedure Laterality Date  . Breast biopsy      both breasts  . Total abdominal hysterectomy      Social History:   reports that she quit smoking about 8 years ago. Her smoking use included Cigarettes. She has a 30  pack-year smoking history. She has never used smokeless tobacco. She reports that she drinks about 1.0 ounces of alcohol per week. She reports that she does not use illicit drugs.  Family History  Problem Relation Age of Onset  . COPD Mother   . COPD Sister   . Hypertension Sister   . Cancer Father   . Asthma Father   . Diabetes Other     3 maternal aunts  . Prostate cancer Paternal Uncle     Medications: Patient's Medications  New Prescriptions   No medications on file  Previous Medications   ALBUTEROL (PROVENTIL) (2.5 MG/3ML) 0.083% NEBULIZER SOLUTION    Take 2.5 mg by nebulization every 6 (six) hours as needed for shortness of breath.    BUDESONIDE-FORMOTEROL (SYMBICORT) 80-4.5 MCG/ACT INHALER    Inhale 2 puffs into the lungs 2 (two) times daily.   FERROUS SULFATE (IRON) 325 (65 FE) MG TABS    Take 1 tablet by mouth daily.     GUAIFENESIN (MUCINEX) 600 MG 12 HR TABLET    Take 1 tablet (600 mg total) by mouth 2 (two) times daily.   HYDRALAZINE (APRESOLINE) 25 MG TABLET    Take 1 tablet (25 mg total) by mouth every 8 (eight) hours.   HYDROCHLOROTHIAZIDE (HYDRODIURIL) 25 MG TABLET    Take 1 tablet (25 mg total) by mouth daily.   INDOMETHACIN (INDOCIN) 25 MG CAPSULE    Take 25 mg by mouth 3 (three) times daily as needed (for gout).   LOSARTAN (COZAAR) 100 MG TABLET    Take 1 tablet (100 mg total) by mouth daily.   LOVASTATIN (MEVACOR) 40 MG TABLET    Take 40 mg by mouth at bedtime.     MULTIPLE VITAMIN (MULTIVITAMIN WITH MINERALS) TABS    Take 1 tablet by mouth daily.   OXYCODONE (OXY IR/ROXICODONE) 5 MG IMMEDIATE RELEASE TABLET    Take 1 tablet (5 mg total) by mouth every 4 (four) hours as needed.   OXYGEN-HELIUM IN    Inhale 2 L/min into the lungs daily. 24/7   TIOTROPIUM (SPIRIVA) 18 MCG INHALATION CAPSULE    Place 18 mcg into inhaler and inhale daily.  Modified Medications   Modified Medication Previous Medication   ALBUTEROL (PROAIR HFA) 108 (90 BASE) MCG/ACT INHALER albuterol  (PROAIR HFA) 108 (90 BASE) MCG/ACT inhaler      Inhale 2 puffs into the lungs every 4 (four) hours as needed for shortness of breath.    Inhale 2 puffs into the lungs every 4 (four) hours as needed for shortness of breath.    METFORMIN (GLUCOPHAGE) 500 MG TABLET metFORMIN (GLUCOPHAGE) 500 MG tablet      Take 1 tablet (500 mg total) by mouth 2 (two) times daily with a meal.    Take  500 mg by mouth 2 (two) times daily with a meal.  Discontinued Medications   AZITHROMYCIN (ZITHROMAX) 250 MG TABLET    Take 2 tablets (500 mg total) by mouth daily.   PREDNISONE (DELTASONE) 50 MG TABLET    Take 50 mg tablet today and taper down by 10 mg daily until completed.     Physical Exam: Filed Vitals:   12/31/12 1041  BP: 150/88  Pulse: 63  Temp: 98.3 F (36.8 C)  TempSrc: Oral  Resp: 14  Height: 5' 3.5" (1.613 m)  Weight: 197 lb (89.359 kg)  SpO2: 99%  Physical Exam  Constitutional: She is oriented to person, place, and time. She appears well-developed and well-nourished. No distress.  HENT:  Head: Normocephalic and atraumatic.  Neck: No JVD present.  Cardiovascular: Normal rate, regular rhythm, normal heart sounds and intact distal pulses.  Exam reveals no gallop and no friction rub.   No murmur heard. Pulmonary/Chest: Effort normal and breath sounds normal. No respiratory distress.  Abdominal: Soft. Bowel sounds are normal. She exhibits no distension. There is tenderness.  Mild midabdominal tenderness  Musculoskeletal: Normal range of motion. She exhibits no edema and no tenderness.  Lymphadenopathy:    She has no cervical adenopathy.  Neurological: She is alert and oriented to person, place, and time. No cranial nerve deficit.  Skin: Skin is warm and dry.  Small patch midline lumbar area with one raised cyst present and 2 dried up papules  Psychiatric:  Becomes tearful when she discusses her shortness of breath and anxiety    Labs reviewed: Basic Metabolic Panel:  Recent Labs   11/09/12 0425 11/10/12 0517 11/16/12 1654  NA 140 140 142  K 3.7 3.3* 4.0  CL 101 100 98  CO2 29 33* 33*  GLUCOSE 149* 108* 73  BUN 37* 37* 19  CREATININE 1.16* 1.19* 1.03*  CALCIUM 9.0 8.9 8.9   Liver Function Tests:  Recent Labs  11/09/12 0425  AST 18  ALT 18  ALKPHOS 66  BILITOT 0.2*  PROT 6.8  ALBUMIN 3.1*  CBC:  Recent Labs  09/24/12 1433  11/07/12 0340 11/09/12 0425 11/10/12 0517  WBC 6.0  < > 8.4 10.0 8.1  NEUTROABS 3.4  --   --   --   --   HGB 9.4*  < > 9.4* 9.5* 9.7*  HCT 31.8*  < > 31.5* 32.5* 32.4*  MCV 85  < > 84.7 85.8 85.0  PLT  --   < > 253 251 241  < > = values in this interval not displayed.  Lab Results  Component Value Date   HGBA1C 5.9* 11/07/2012   Assessment/Plan COPD (chronic obstructive pulmonary disease) Has all of her meds now except her rescue inhaler which I reordered today (faxed to her pharmacy).  She is using all of them as directed at this time.   Essential hypertension, benign At upper limits of satisfactory today.  She is out of breath sitting.  Continue current regimen  History of colon polyps Suspect diverticular bleeding as cause of her hematochezia and melena she had a few weeks ago for a week after eating crushed red pepper.  Concerning, however.  See melena a/p.  Iron deficiency anemia F/u cbc today due to bloody stools.    Type II or unspecified type diabetes mellitus with unspecified complication, uncontrolled Control has been improving.  hba1c 5.9 two mos ago.  Not yet due for recheck.  On statin and arb.  Melena:  And hematochezia--has now  resolved.  F/u cbc.  Heme check stools x 3.  Left leg edema:  This is new.  No pain.  Will r/o DVT with venous doppler today.  Sebaceous cyst:  Seems to be what she has on her low back with dry skin surrounding.  No herpes zoster of concerning rash on low back.  Advised she could apply hydrocortisone cream if the area continued to itch, but would not drain cyst b/c it is not  painful.    Labs/tests ordered:  Cbc, heme check stools x 3, venous doppler Next appt:  3 mos

## 2012-12-31 NOTE — Patient Instructions (Signed)
Please do hemoccult stool tests at home and bring back to office.    We checked your blood counts today due to your bloody stools.    Please go get an ultrasound of your left leg to make sure you do not have a blood clot.

## 2013-01-01 ENCOUNTER — Telehealth: Payer: Self-pay | Admitting: Geriatric Medicine

## 2013-01-01 ENCOUNTER — Encounter (HOSPITAL_COMMUNITY): Payer: Self-pay | Admitting: *Deleted

## 2013-01-01 ENCOUNTER — Inpatient Hospital Stay (HOSPITAL_COMMUNITY)
Admission: EM | Admit: 2013-01-01 | Discharge: 2013-01-07 | DRG: 378 | Disposition: A | Discharge status: Home / Self Care | Payer: Medicare Other | Attending: Internal Medicine | Admitting: Internal Medicine

## 2013-01-01 DIAGNOSIS — G4700 Insomnia, unspecified: Secondary | ICD-10-CM

## 2013-01-01 DIAGNOSIS — D62 Acute posthemorrhagic anemia: Secondary | ICD-10-CM | POA: Diagnosis present

## 2013-01-01 DIAGNOSIS — Z880 Allergy status to penicillin: Secondary | ICD-10-CM

## 2013-01-01 DIAGNOSIS — IMO0002 Reserved for concepts with insufficient information to code with codable children: Secondary | ICD-10-CM | POA: Diagnosis present

## 2013-01-01 DIAGNOSIS — F411 Generalized anxiety disorder: Secondary | ICD-10-CM | POA: Diagnosis present

## 2013-01-01 DIAGNOSIS — J441 Chronic obstructive pulmonary disease with (acute) exacerbation: Secondary | ICD-10-CM

## 2013-01-01 DIAGNOSIS — D509 Iron deficiency anemia, unspecified: Secondary | ICD-10-CM

## 2013-01-01 DIAGNOSIS — Z91199 Patient's noncompliance with other medical treatment and regimen due to unspecified reason: Secondary | ICD-10-CM

## 2013-01-01 DIAGNOSIS — E669 Obesity, unspecified: Secondary | ICD-10-CM | POA: Diagnosis present

## 2013-01-01 DIAGNOSIS — I313 Pericardial effusion (noninflammatory): Secondary | ICD-10-CM

## 2013-01-01 DIAGNOSIS — E1142 Type 2 diabetes mellitus with diabetic polyneuropathy: Secondary | ICD-10-CM | POA: Diagnosis present

## 2013-01-01 DIAGNOSIS — Z90710 Acquired absence of both cervix and uterus: Secondary | ICD-10-CM

## 2013-01-01 DIAGNOSIS — Z9981 Dependence on supplemental oxygen: Secondary | ICD-10-CM

## 2013-01-01 DIAGNOSIS — F410 Panic disorder [episodic paroxysmal anxiety] without agoraphobia: Secondary | ICD-10-CM | POA: Diagnosis present

## 2013-01-01 DIAGNOSIS — Z833 Family history of diabetes mellitus: Secondary | ICD-10-CM

## 2013-01-01 DIAGNOSIS — Z9119 Patient's noncompliance with other medical treatment and regimen: Secondary | ICD-10-CM

## 2013-01-01 DIAGNOSIS — E1165 Type 2 diabetes mellitus with hyperglycemia: Secondary | ICD-10-CM | POA: Diagnosis present

## 2013-01-01 DIAGNOSIS — E782 Mixed hyperlipidemia: Secondary | ICD-10-CM | POA: Diagnosis present

## 2013-01-01 DIAGNOSIS — J449 Chronic obstructive pulmonary disease, unspecified: Secondary | ICD-10-CM | POA: Diagnosis present

## 2013-01-01 DIAGNOSIS — F329 Major depressive disorder, single episode, unspecified: Secondary | ICD-10-CM | POA: Diagnosis present

## 2013-01-01 DIAGNOSIS — E1149 Type 2 diabetes mellitus with other diabetic neurological complication: Secondary | ICD-10-CM | POA: Diagnosis present

## 2013-01-01 DIAGNOSIS — Z825 Family history of asthma and other chronic lower respiratory diseases: Secondary | ICD-10-CM

## 2013-01-01 DIAGNOSIS — K31811 Angiodysplasia of stomach and duodenum with bleeding: Principal | ICD-10-CM | POA: Diagnosis present

## 2013-01-01 DIAGNOSIS — F3289 Other specified depressive episodes: Secondary | ICD-10-CM | POA: Diagnosis present

## 2013-01-01 DIAGNOSIS — Z836 Family history of other diseases of the respiratory system: Secondary | ICD-10-CM

## 2013-01-01 DIAGNOSIS — Z8249 Family history of ischemic heart disease and other diseases of the circulatory system: Secondary | ICD-10-CM

## 2013-01-01 DIAGNOSIS — D500 Iron deficiency anemia secondary to blood loss (chronic): Secondary | ICD-10-CM | POA: Diagnosis present

## 2013-01-01 DIAGNOSIS — I10 Essential (primary) hypertension: Secondary | ICD-10-CM | POA: Diagnosis present

## 2013-01-01 DIAGNOSIS — K922 Gastrointestinal hemorrhage, unspecified: Secondary | ICD-10-CM | POA: Diagnosis present

## 2013-01-01 DIAGNOSIS — Z860100 Personal history of colon polyps, unspecified: Secondary | ICD-10-CM

## 2013-01-01 DIAGNOSIS — K5520 Angiodysplasia of colon without hemorrhage: Secondary | ICD-10-CM | POA: Diagnosis present

## 2013-01-01 DIAGNOSIS — J4489 Other specified chronic obstructive pulmonary disease: Secondary | ICD-10-CM | POA: Diagnosis present

## 2013-01-01 DIAGNOSIS — R079 Chest pain, unspecified: Secondary | ICD-10-CM

## 2013-01-01 DIAGNOSIS — M109 Gout, unspecified: Secondary | ICD-10-CM | POA: Diagnosis not present

## 2013-01-01 DIAGNOSIS — E118 Type 2 diabetes mellitus with unspecified complications: Secondary | ICD-10-CM

## 2013-01-01 DIAGNOSIS — Z6833 Body mass index (BMI) 33.0-33.9, adult: Secondary | ICD-10-CM

## 2013-01-01 DIAGNOSIS — Z8042 Family history of malignant neoplasm of prostate: Secondary | ICD-10-CM

## 2013-01-01 DIAGNOSIS — D649 Anemia, unspecified: Secondary | ICD-10-CM | POA: Diagnosis present

## 2013-01-01 DIAGNOSIS — K31819 Angiodysplasia of stomach and duodenum without bleeding: Secondary | ICD-10-CM | POA: Diagnosis present

## 2013-01-01 DIAGNOSIS — Z87891 Personal history of nicotine dependence: Secondary | ICD-10-CM

## 2013-01-01 DIAGNOSIS — K5521 Angiodysplasia of colon with hemorrhage: Secondary | ICD-10-CM | POA: Diagnosis present

## 2013-01-01 DIAGNOSIS — J9610 Chronic respiratory failure, unspecified whether with hypoxia or hypercapnia: Secondary | ICD-10-CM | POA: Diagnosis present

## 2013-01-01 DIAGNOSIS — Z888 Allergy status to other drugs, medicaments and biological substances status: Secondary | ICD-10-CM

## 2013-01-01 DIAGNOSIS — Z79899 Other long term (current) drug therapy: Secondary | ICD-10-CM

## 2013-01-01 HISTORY — DX: Iron deficiency anemia secondary to blood loss (chronic): D50.0

## 2013-01-01 LAB — CBC WITH DIFFERENTIAL/PLATELET
Basophils Absolute: 0 10*3/uL (ref 0.0–0.2)
Basophils Absolute: 0 10*3/uL (ref 0.0–0.1)
Basophils Relative: 0 % (ref 0–1)
Basos: 0 % (ref 0–3)
Eos: 2 % (ref 0–5)
Eosinophils Absolute: 0.1 10*3/uL (ref 0.0–0.4)
Eosinophils Relative: 2 % (ref 0–5)
HCT: 21.7 % — ABNORMAL LOW (ref 34.0–46.6)
HCT: 21.7 % — ABNORMAL LOW (ref 36.0–46.0)
Hemoglobin: 6.5 g/dL — CL (ref 11.1–15.9)
Immature Grans (Abs): 0 10*3/uL (ref 0.0–0.1)
Immature Granulocytes: 0 % (ref 0–2)
Lymphocytes Absolute: 1.2 10*3/uL (ref 0.7–3.1)
Lymphocytes Relative: 20 % (ref 12–46)
Lymphs: 19 % (ref 14–46)
MCH: 26.6 pg (ref 26.6–33.0)
MCHC: 30 g/dL — ABNORMAL LOW (ref 31.5–35.7)
MCHC: 30.4 g/dL (ref 30.0–36.0)
MCV: 89 fL (ref 79–97)
Monocytes Absolute: 0.7 10*3/uL (ref 0.1–0.9)
Monocytes Absolute: 0.5 10*3/uL (ref 0.1–1.0)
Monocytes: 11 % (ref 4–12)
Neutro Abs: 3.8 10*3/uL (ref 1.7–7.7)
Neutrophils Absolute: 4.3 10*3/uL (ref 1.4–7.0)
Neutrophils Relative %: 68 % (ref 40–74)
Platelets: 346 10*3/uL (ref 150–400)
RBC: 2.44 x10E6/uL — CL (ref 3.77–5.28)
RDW: 17.1 % — ABNORMAL HIGH (ref 12.3–15.4)
RDW: 17 % — ABNORMAL HIGH (ref 11.5–15.5)
WBC: 6.4 10*3/uL (ref 3.4–10.8)
WBC: 5.5 10*3/uL (ref 4.0–10.5)

## 2013-01-01 LAB — BASIC METABOLIC PANEL
CO2: 33 mEq/L — ABNORMAL HIGH (ref 19–32)
Calcium: 9.4 mg/dL (ref 8.4–10.5)
Creatinine, Ser: 0.99 mg/dL (ref 0.50–1.10)
GFR calc Af Amer: 67 mL/min — ABNORMAL LOW (ref >90)
Sodium: 140 mEq/L (ref 135–145)

## 2013-01-01 MED ORDER — SODIUM CHLORIDE 0.9 % IJ SOLN
3.0000 mL | Freq: Two times a day (BID) | INTRAMUSCULAR | Status: DC
  Administered 2013-01-04 – 2013-01-06 (×4): 3 mL via INTRAVENOUS

## 2013-01-01 MED ORDER — HYDROCHLOROTHIAZIDE 25 MG PO TABS
25.0000 mg | ORAL_TABLET | Freq: Every day | ORAL | Status: DC
  Administered 2013-01-02 – 2013-01-06 (×4): 25 mg via ORAL
  Filled 2013-01-01 (×5): qty 1

## 2013-01-01 MED ORDER — ALBUTEROL SULFATE (5 MG/ML) 0.5% IN NEBU: 2.5000 mg | INHALATION_SOLUTION | RESPIRATORY_TRACT | Status: DC | PRN

## 2013-01-01 MED ORDER — BIOTENE DRY MOUTH MT LIQD: 15.0000 mL | Freq: Two times a day (BID) | OROMUCOSAL | Status: DC

## 2013-01-01 MED ORDER — ACETAMINOPHEN 650 MG RE SUPP: 650.0000 mg | Freq: Four times a day (QID) | RECTAL | Status: DC | PRN

## 2013-01-01 MED ORDER — FERROUS SULFATE 325 (65 FE) MG PO TABS
325.0000 mg | ORAL_TABLET | Freq: Every day | ORAL | Status: DC
  Administered 2013-01-02 – 2013-01-04 (×2): 325 mg via ORAL
  Filled 2013-01-01 (×3): qty 1

## 2013-01-01 MED ORDER — LOSARTAN POTASSIUM 50 MG PO TABS
100.0000 mg | ORAL_TABLET | Freq: Every day | ORAL | Status: DC
  Administered 2013-01-02 – 2013-01-06 (×4): 100 mg via ORAL
  Filled 2013-01-01 (×5): qty 2

## 2013-01-01 MED ORDER — ALBUTEROL SULFATE HFA 108 (90 BASE) MCG/ACT IN AERS
2.0000 | INHALATION_SPRAY | RESPIRATORY_TRACT | Status: DC | PRN
  Administered 2013-01-02 – 2013-01-04 (×4): 2 via RESPIRATORY_TRACT
  Filled 2013-01-01: qty 6.7

## 2013-01-01 MED ORDER — ACETAMINOPHEN 325 MG PO TABS: 650.0000 mg | ORAL_TABLET | Freq: Four times a day (QID) | ORAL | Status: DC | PRN

## 2013-01-01 MED ORDER — HYDRALAZINE HCL 25 MG PO TABS
25.0000 mg | ORAL_TABLET | Freq: Three times a day (TID) | ORAL | Status: DC
  Administered 2013-01-01 – 2013-01-06 (×15): 25 mg via ORAL
  Filled 2013-01-01 (×18): qty 1

## 2013-01-01 MED ORDER — TIOTROPIUM BROMIDE MONOHYDRATE 18 MCG IN CAPS
18.0000 ug | ORAL_CAPSULE | Freq: Every day | RESPIRATORY_TRACT | Status: DC
  Administered 2013-01-02 – 2013-01-06 (×5): 18 ug via RESPIRATORY_TRACT
  Filled 2013-01-01: qty 5

## 2013-01-01 MED ORDER — METFORMIN HCL 500 MG PO TABS
500.0000 mg | ORAL_TABLET | Freq: Two times a day (BID) | ORAL | Status: DC
  Filled 2013-01-01 (×3): qty 1

## 2013-01-01 MED ORDER — SERTRALINE HCL 50 MG PO TABS
50.0000 mg | ORAL_TABLET | Freq: Every day | ORAL | Status: DC
  Administered 2013-01-01 – 2013-01-06 (×6): 50 mg via ORAL
  Filled 2013-01-01 (×6): qty 1

## 2013-01-01 MED ORDER — CHLORHEXIDINE GLUCONATE 0.12 % MT SOLN
15.0000 mL | Freq: Two times a day (BID) | OROMUCOSAL | Status: DC
  Administered 2013-01-04 – 2013-01-06 (×4): 15 mL via OROMUCOSAL
  Filled 2013-01-01 (×11): qty 15

## 2013-01-01 MED ORDER — BUDESONIDE-FORMOTEROL FUMARATE 80-4.5 MCG/ACT IN AERO
2.0000 | INHALATION_SPRAY | Freq: Two times a day (BID) | RESPIRATORY_TRACT | Status: DC
  Administered 2013-01-01 – 2013-01-06 (×11): 2 via RESPIRATORY_TRACT
  Filled 2013-01-01: qty 6.9

## 2013-01-01 NOTE — ED Provider Notes (Signed)
History    CSN: [id number] Arrival date & time 01/01/13  1557  First MD Initiated Contact with Patient 01/01/13 1656     Chief Complaint  Patient presents with  . low Hgb    (Consider location/radiation/quality/duration/timing/severity/associated sxs/prior Treatment) HPI Comments: Patient was sent to the ED by her PCP for Hgb 6.  States she has had maroon and dark stools intermittently for 2 weeks.  Has coming increasing SOB and tired, with worsening DoE.  Pt states she no longer can walk to the bathroom without extreme SOB.  Denies lightheadedness or dizziness.  Has had a light cough productive of white sputum x 3-4 days.  Does note occasional abdominal bloating with eating. Denies any other source of bleeding or any abnormal bruising. Denies fever, chills, body aches, chest pain, abdominal pain, N/V.  Has had colonoscopies in the past x 3, last 2013, polyps have been removed, no malignancies.  Pt takes an 81mg  aspirin daily.  Denies other blood thinner usage.    The history is provided by the patient.   Past Medical History  Diagnosis Date  . Asthma   . COPD (chronic obstructive pulmonary disease)   . Hypertension   . Diabetes mellitus 09/13/2010    glucophage  . Gout   . Oxygen dependent     pt uses O2   . Pericardial effusion 08/2011    small residual on echo  . Hyperlipidemia   . Type II or unspecified type diabetes mellitus with neurological manifestations, uncontrolled(250.62)   . Personal history of noncompliance with medical treatment, presenting hazards to health   . Unspecified disease of pericardium   . Hemorrhage of rectum and anus   . Panic disorder without agoraphobia   . Depressive disorder, not elsewhere classified   . Edema   . Anxiety state, unspecified   . Unspecified hereditary and idiopathic peripheral neuropathy   . Type II or unspecified type diabetes mellitus without mention of complication, not stated as uncontrolled   . Other and unspecified  hyperlipidemia   . Obesity, unspecified   . Unspecified tinnitus   . Unspecified essential hypertension   . Pneumonia, organism unspecified   . Chronic obstructive asthma, unspecified   . Acute and chronic respiratory failure   . Urinary frequency   . Urgency of urination    Past Surgical History  Procedure Laterality Date  . Breast biopsy      both breasts  . Total abdominal hysterectomy     Family History  Problem Relation Age of Onset  . COPD Mother   . COPD Sister   . Hypertension Sister   . Cancer Father   . Asthma Father   . Diabetes Other     3 maternal aunts  . Prostate cancer Paternal Uncle    History  Substance Use Topics  . Smoking status: Former Smoker -- 1.00 packs/day for 30 years    Types: Cigarettes    Quit date: 06/17/2004  . Smokeless tobacco: Never Used  . Alcohol Use: 1.0 oz/week    2 drink(s) per week   OB History   Grav Para Term Preterm Abortions TAB SAB Ect Mult Living                 Review of Systems  Constitutional: Negative for fever and chills.  HENT: Negative for nosebleeds.   Respiratory: Positive for cough and shortness of breath.   Cardiovascular: Positive for leg swelling. Negative for chest pain.  Bilateral lower extremity edema x 2 weeks, unchanged.  Gastrointestinal: Positive for blood in stool. Negative for nausea, vomiting, abdominal pain and diarrhea.  Genitourinary: Negative for hematuria and vaginal bleeding.  Skin: Negative for wound.  Neurological: Negative for dizziness and light-headedness.  Hematological: Does not bruise/bleed easily.    Allergies  Lisinopril; Advair diskus; and Penicillins  Home Medications   Current Outpatient Rx  Name  Route  Sig  Dispense  Refill  . albuterol (PROAIR HFA) 108 (90 BASE) MCG/ACT inhaler   Inhalation   Inhale 2 puffs into the lungs every 4 (four) hours as needed for shortness of breath.   1 Inhaler   5   . albuterol (PROVENTIL) (2.5 MG/3ML) 0.083% nebulizer  solution   Nebulization   Take 2.5 mg by nebulization every 6 (six) hours as needed for shortness of breath.          . budesonide-formoterol (SYMBICORT) 80-4.5 MCG/ACT inhaler   Inhalation   Inhale 2 puffs into the lungs 2 (two) times daily.   4 Inhaler   0   . Ferrous Sulfate (IRON) 325 (65 FE) MG TABS   Oral   Take 1 tablet by mouth daily.           . hydrALAZINE (APRESOLINE) 25 MG tablet   Oral   Take 1 tablet (25 mg total) by mouth every 8 (eight) hours.   90 tablet   3   . hydrochlorothiazide (HYDRODIURIL) 25 MG tablet   Oral   Take 1 tablet (25 mg total) by mouth daily.   30 tablet   3   . indomethacin (INDOCIN) 25 MG capsule   Oral   Take 25 mg by mouth 3 (three) times daily as needed (for gout).         [name] losartan (COZAAR) 100 MG tablet   Oral   Take 1 tablet (100 mg total) by mouth daily.   90 tablet   3   . lovastatin (MEVACOR) 40 MG tablet   Oral   Take 40 mg by mouth at bedtime.           . metFORMIN (GLUCOPHAGE) 500 MG tablet   Oral   Take 1 tablet (500 mg total) by mouth 2 (two) times daily with a meal.   60 tablet   3   . OXYGEN-HELIUM IN   Inhalation   Inhale 2 L/min into the lungs daily. 24/7         . tiotropium (SPIRIVA) 18 MCG inhalation capsule   Inhalation   Place 18 mcg into inhaler and inhale daily.          BP 159/88  Pulse 93  Temp(Src) 98.3 F (36.8 C)  Resp 18  SpO2 97% Physical Exam  Nursing note and vitals reviewed. Constitutional: She appears well-developed and well-nourished. No distress.  HENT:  Head: Normocephalic and atraumatic.  Neck: Neck supple.  Cardiovascular: Normal rate and regular rhythm.   Pulmonary/Chest: Effort normal. No respiratory distress. She has decreased breath sounds. She has no wheezes. She has no rales.  Decreased breath sounds throughout.  Wearing O2 by nasal canula (on home O2)  Abdominal: Soft. She exhibits no distension. There is no tenderness. There is no rebound and no  guarding.  Genitourinary: Rectum normal.  Musculoskeletal: She exhibits edema.  Bilateral lower extremity edema, pitting, to midshin.  Neurological: She is alert.  Skin: She is not diaphoretic.  Psychiatric: She has a normal mood and affect. Her behavior is normal. Thought  content normal.    ED Course  Procedures (including critical care time) Labs Reviewed  CBC WITH DIFFERENTIAL - Abnormal; Notable for the following:    RBC 2.44 (*)    Hemoglobin 6.6 (*)    HCT 21.7 (*)    RDW 17.0 (*)    All other components within normal limits  BASIC METABOLIC PANEL - Abnormal; Notable for the following:    CO2 33 (*)    Glucose, Bld 113 (*)    GFR calc non Af Amer 58 (*)    GFR calc Af Amer 67 (*)    All other components within normal limits  OCCULT BLOOD, POC DEVICE - Abnormal; Notable for the following:    Fecal Occult Bld POSITIVE (*)    All other components within normal limits  TYPE AND SCREEN  ABO/RH  PREPARE RBC (CROSSMATCH)   No results found.  Pt discussed with Dr Ethelda Chick.   1. Anemia   2. GI bleed     MDM  Pt with intermittent blood in stool x several weeks, gradually became more weak and SOB, found to be anemic with Hgb 6.6.  Hemoccult positive.  Type and cross ordered.  Pt admitted to hospital, [facility] to admit.  Holding orders placed by me.  Pt on clear liquid diet.  She did have diabetic tray ordered and had just received it - took one bite of potato prior to my taking it away from her.  Pt informed of all results and updated on plan.  Pt verbalizes understanding and agrees with plan.    Trixie Dredge, PA-C 01/01/13 1925

## 2013-01-01 NOTE — ED Notes (Signed)
Critical Lab value received of Hgb 6.6.

## 2013-01-01 NOTE — ED Provider Notes (Signed)
Patient presents with generalized weakness. Sent from her primary care physician's office with low hemoglobin. Patient not acutely ill appearing alert appropriate present hungry. She does admit to rectal bleeding  Doug Sou, MD 01/01/13 1901

## 2013-01-01 NOTE — ED Notes (Signed)
Per pt sts she was sent here by her doctor for low Hgb of 6. sts she has been having blood in stool and feeling tired and weak.

## 2013-01-01 NOTE — H&P (Signed)
[facility] History and Physical  [name]  ZOX:[id number]  DOB: 12/10/1945  DOA: 01/01/2013  Referring physician: Dr Ethelda Chick PCP: Bufford Spikes, DO   Chief Complaint: GI bleed  HPI: [name] is a 67 y.o. female with Past medical history of COPD, hypertension borderline diabetes, diverticulosis, presented with a complaint of dark maroon loose bowel movement that occurred 3 weeks ago, and at that time lasted for 3 days. After that she started having continuous tiredness along with worsening shortness of breath. She has been using more and more of her throat but gradually since last couple of days no shortness of breath got severely worse and went to see his PCP. The PCP found that she was anemic and sent to the ED for further workup. At present the patient does not have any active bleeding, and her last bowel movement were brown. She denies any abdomen pain, nausea or bleeding anywhere else  Review of Systems: as mentioned in the history of present illness.  A Comprehensive review of the other systems is negative.  Past Medical History  Diagnosis Date  . Asthma   . COPD (chronic obstructive pulmonary disease)   . Hypertension   . Diabetes mellitus 09/13/2010    glucophage  . Gout   . Oxygen dependent     pt uses O2   . Pericardial effusion 08/2011    small residual on echo  . Hyperlipidemia   . Type II or unspecified type diabetes mellitus with neurological manifestations, uncontrolled(250.62)   . Personal history of noncompliance with medical treatment, presenting hazards to health   . Unspecified disease of pericardium   . Hemorrhage of rectum and anus   . Panic disorder without agoraphobia   . Depressive disorder, not elsewhere classified   . Edema   . Anxiety state, unspecified   . Unspecified hereditary and idiopathic peripheral neuropathy   . Type II or unspecified type diabetes mellitus without mention of complication, not stated as  uncontrolled   . Other and unspecified hyperlipidemia   . Obesity, unspecified   . Unspecified tinnitus   . Unspecified essential hypertension   . Pneumonia, organism unspecified   . Chronic obstructive asthma, unspecified   . Acute and chronic respiratory failure   . Urinary frequency   . Urgency of urination    Past Surgical History  Procedure Laterality Date  . Breast biopsy      both breasts  . Total abdominal hysterectomy     Social History:  reports that she quit smoking about 8 years ago. Her smoking use included Cigarettes. She has a 30 pack-year smoking history. She has never used smokeless tobacco. She reports that she drinks about 1.0 ounces of alcohol per week. She reports that she does not use illicit drugs. Patient is coming from home Patient can participate in ADLs.  Allergies  Allergen Reactions  . Lisinopril Swelling    Angioedema   . Advair Diskus (Fluticasone-Salmeterol) Other (See Comments)    Nose bleeds.   . Penicillins     hives    Family History  Problem Relation Age of Onset  . COPD Mother   . COPD Sister   . Hypertension Sister   . Cancer Father   . Asthma Father   . Diabetes Other     3 maternal aunts  . Prostate cancer Paternal Uncle     Prior to Admission medications   Medication Sig Start Date End Date Taking? Authorizing Provider  albuterol (PROVENTIL) (2.5 MG/3ML) 0.083% nebulizer  solution Take 2.5 mg by nebulization every 6 (six) hours as needed for shortness of breath.    Yes Historical Provider, MD  aspirin EC 81 MG tablet Take 81 mg by mouth daily.   Yes Historical Provider, MD  budesonide-formoterol (SYMBICORT) 80-4.5 MCG/ACT inhaler Inhale 2 puffs into the lungs 2 (two) times daily. 11/16/12  Yes Kalman Shan, MD  Ferrous Sulfate (IRON) 325 (65 FE) MG TABS Take 1 tablet by mouth daily.     Yes Historical Provider, MD  hydrALAZINE (APRESOLINE) 25 MG tablet Take 1 tablet (25 mg total) by mouth every 8 (eight) hours. 11/10/12  Yes  Dorothea Ogle, MD  hydrochlorothiazide (HYDRODIURIL) 25 MG tablet Take 1 tablet (25 mg total) by mouth daily. 10/26/12  Yes [name] L Reed, DO  indomethacin (INDOCIN) 25 MG capsule Take 25 mg by mouth 3 (three) times daily as needed (for gout).   Yes Historical Provider, MD  losartan (COZAAR) 100 MG tablet Take 1 tablet (100 mg total) by mouth daily. 09/24/12  Yes [name] L Reed, DO  lovastatin (MEVACOR) 40 MG tablet Take 40 mg by mouth at bedtime.    Yes Historical Provider, MD  metFORMIN (GLUCOPHAGE) 500 MG tablet Take 1 tablet (500 mg total) by mouth 2 (two) times daily with a meal. 12/31/12  Yes [name] L Reed, DO  OXYGEN-HELIUM IN Inhale 2 L/min into the lungs daily. 24/7   Yes Historical Provider, MD  sertraline (ZOLOFT) 25 MG tablet Take 50 mg by mouth at bedtime.   Yes Historical Provider, MD  tiotropium (SPIRIVA) 18 MCG inhalation capsule Place 18 mcg into inhaler and inhale daily.   Yes Historical Provider, MD  albuterol (PROAIR HFA) 108 (90 BASE) MCG/ACT inhaler Inhale 2 puffs into the lungs every 4 (four) hours as needed for shortness of breath. 12/31/12   Kermit Balo, DO    Physical Exam: Filed Vitals:   01/01/13 1900 01/01/13 2048 01/01/13 2115 01/01/13 2136  BP: 153/83 158/92 165/95 162/77  Pulse: 91 109 92 89  Temp:  98.4 F (36.9 C) 98.8 F (37.1 C) 98 F (36.7 C)  TempSrc:  Oral Oral Oral  Resp: 26 24 18 20   Height:  5' 3.5" (1.613 m)    Weight:  87.952 kg (193 lb 14.4 oz)    SpO2: 98% 98% 99% 98%    General: Alert, Awake and Oriented to Time, Place and Person. Appear in milddistress Eyes: PERRL ENT: Oral Mucosa clear moist . Neck: Normal JVD, no  Carotid Bruits,  Cardiovascular: S1 and S2 Present, Murmur present, Peripheral Pulses Present Respiratory: Clear to Auscultation, Bilateral Air entry equal and Decreased, Crackles negative, wheezes negative  Abdomen: Bowel Sound Present, Soft and Non tender, Organomegaly Skin: no decubitus Ulcer Extremities: Pedal edema  negative, calf tenderness negative. Neurologic: Mental status, Motor strength, Sensation, reflexes, Proprioception Grossly Unremarkable.  Labs on Admission:  Basic Metabolic Panel:  Recent Labs Lab 01/01/13 1723  NA 140  K 3.9  CL 100  CO2 33*  GLUCOSE 113*  BUN 14  CREATININE 0.99  CALCIUM 9.4   Liver Function Tests: No results found for this basename: AST, ALT, ALKPHOS, BILITOT, PROT, ALBUMIN,  in the last 168 hours No results found for this basename: LIPASE, AMYLASE,  in the last 168 hours No results found for this basename: AMMONIA,  in the last 168 hours CBC:  Recent Labs Lab 12/31/12 1152 01/01/13 1723  WBC 6.4 5.5  NEUTROABS 4.3 3.8  HGB 6.5* 6.6*  HCT 21.7*  21.7*  MCV 89 88.9  PLT  --  346   Cardiac Enzymes: No results found for this basename: CKTOTAL, CKMB, CKMBINDEX, TROPONINI,  in the last 168 hours  BNP (last 3 results)  Recent Labs  11/06/12 1615  PROBNP 182.3*   CBG: No results found for this basename: GLUCAP,  in the last 168 hours  Radiological Exams on Admission: No results found.  Assessment/Plan Principal Problem:   GI bleed Active Problems:   COPD (chronic obstructive pulmonary disease)   Mixed hyperlipidemia   Type II or unspecified type diabetes mellitus with unspecified complication, uncontrolled   Essential hypertension, benign   Anemia   1. GI bleeding More likely a diverticular bleed since the patient does not have any pain, and she did have diverticulosis on her last year her colonoscopy. At present we will keep her n.p.o. We will give her 2 units of blood and check her H&H regularly Patient appears to be hemodynamically stable and that is no active bleeding therefore there is no need for acute intervention, but he was concerned GI for further workup  2. COPD Continue her on home inhaler at home oxygen  3. diabetes mellitus And place her on sliding insulin scale    DVT Prophylaxis: mechanical compression  device Nutrition: N.p.o.  Code Status: Full   Author: Lynden Oxford, MD [facility] Pager: [phone] 01/01/2013 10:13 PM    If 7PM-7AM, please contact night-coverage [email or web address] Password [email or web address]

## 2013-01-01 NOTE — ED Notes (Signed)
Ordered diabetic dinner tray 

## 2013-01-01 NOTE — Telephone Encounter (Signed)
Called patient and left her a message asking her to call me back as soon as possible concerning her labs. She needs to go to the emergency room due to her low hgb.

## 2013-01-02 DIAGNOSIS — D649 Anemia, unspecified: Secondary | ICD-10-CM

## 2013-01-02 DIAGNOSIS — D62 Acute posthemorrhagic anemia: Secondary | ICD-10-CM

## 2013-01-02 DIAGNOSIS — K922 Gastrointestinal hemorrhage, unspecified: Secondary | ICD-10-CM

## 2013-01-02 DIAGNOSIS — I10 Essential (primary) hypertension: Secondary | ICD-10-CM

## 2013-01-02 DIAGNOSIS — J449 Chronic obstructive pulmonary disease, unspecified: Secondary | ICD-10-CM

## 2013-01-02 LAB — CBC
Hemoglobin: 8 g/dL — ABNORMAL LOW (ref 12.0–15.0)
MCH: 27 pg (ref 26.0–34.0)
MCHC: 31 g/dL (ref 30.0–36.0)
MCV: 87.2 fL (ref 78.0–100.0)

## 2013-01-02 LAB — BASIC METABOLIC PANEL
BUN: 12 mg/dL (ref 6–23)
CO2: 29 mEq/L (ref 19–32)
Calcium: 9.4 mg/dL (ref 8.4–10.5)
GFR calc non Af Amer: 58 mL/min — ABNORMAL LOW (ref >90)
Glucose, Bld: 104 mg/dL — ABNORMAL HIGH (ref 70–99)
Potassium: 3.5 mEq/L (ref 3.5–5.1)

## 2013-01-02 LAB — PROTIME-INR: Prothrombin Time: 12.9 seconds (ref 11.6–15.2)

## 2013-01-02 MED ORDER — PANTOPRAZOLE SODIUM 40 MG PO TBEC
40.0000 mg | DELAYED_RELEASE_TABLET | Freq: Every day | ORAL | Status: DC
  Administered 2013-01-02 – 2013-01-04 (×3): 40 mg via ORAL
  Filled 2013-01-02 (×3): qty 1

## 2013-01-02 MED ORDER — INSULIN ASPART 100 UNIT/ML ~~LOC~~ SOLN
0.0000 [IU] | Freq: Three times a day (TID) | SUBCUTANEOUS | Status: DC
  Administered 2013-01-03 – 2013-01-04 (×2): 1 [IU] via SUBCUTANEOUS

## 2013-01-02 NOTE — Progress Notes (Signed)
Patient admitted to 5w27 from ED. Patient is A&Ox3. Patient lives at home alone. Patient oriented to unit and room. Patient's skin is warm, dry and intact. Patient has swelling in bilateral lower extremities. Elevated legs on pillows. Patient refused to watch safety video. Patient placed on tele running stach. Will continue to monitor patient. Nelda Marseille, RN

## 2013-01-02 NOTE — ED Provider Notes (Signed)
Medical screening examination/treatment/procedure(s) were conducted as a shared visit with non-physician practitioner(s) and myself.  I personally evaluated the patient during the encounter  Doug Sou, MD 01/02/13 [phone]

## 2013-01-02 NOTE — Progress Notes (Signed)
PATIENT DETAILS Name: [name] Age: 67 y.o. 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 -no current bleeding, last melanotic stool was 3-4 days back -GI consulted-planning EGD tomorrow -start PPI  Active Problems: Subacute Blood Loss anemia -Transfused 2 units PRBC on admission, with appropriate rise in Hb -monitor H/H    COPD (chronic obstructive pulmonary disease) -lungs clear on exam -stable -c/w Spiriva and prn Albuterol  DM -hold Metformin while inpatient -c/w SSI    Mixed hyperlipidemia -resume Statins on discharge    Essential hypertension, benign -BP stable -c/w HCTZ, Losartan,and Hydralazine  Disposition: Remain inpatient  DVT Prophylaxis:  SCD's  Code Status: Full code   Family Communication None at bedside  Procedures:  None  CONSULTS:  GI   MEDICATIONS: Scheduled Meds: . budesonide-formoterol  2 puff Inhalation BID  . chlorhexidine  15 mL Mouth Rinse BID  . ferrous sulfate  325 mg Oral Daily  . hydrALAZINE  25 mg Oral Q8H  . hydrochlorothiazide  25 mg Oral Daily  . losartan  100 mg Oral Daily  . metFORMIN  500 mg Oral BID WC  . sertraline  50 mg Oral QHS  . sodium chloride  3 mL Intravenous Q12H  . tiotropium  18 mcg Inhalation Daily   Continuous Infusions:  PRN Meds:.acetaminophen, acetaminophen, albuterol, albuterol  Antibiotics: Anti-infectives   None       PHYSICAL EXAM: Vital signs in last 24 hours: Filed Vitals:   01/02/13 0122 01/02/13 0222 01/02/13 0241 01/02/13 0533  BP: 151/78 156/84 163/96 151/85  Pulse: 96 98 100 104  Temp: 98.4 F (36.9 C) 98 F (36.7 C) 98.1 F (36.7 C) 98.4 F (36.9 C)  TempSrc: Oral Oral Oral Oral  Resp: 20 18 20 18   Height:       Weight:      SpO2: 99% 97% 100% 95%    Weight change:  Filed Weights   01/01/13 2048  Weight: 87.952 kg (193 lb 14.4 oz)   Body mass index is 33.8 kg/(m^2).   Gen Exam: Awake and alert with clear speech.   Neck: Supple, No JVD.   Chest: B/L Clear.   CVS: S1 S2 Regular, no murmurs.  Abdomen: soft, BS +, non tender, non distended.  Extremities: no edema, lower extremities warm to touch. Neurologic: Non Focal.   Skin: No Rash.   Wounds: N/A.    Intake/Output from previous day:  Intake/Output Summary (Last 24 hours) at 01/02/13 1351 Last data filed at 01/02/13 0236  Gross per 24 hour  Intake 801.68 ml  Output      0 ml  Net 801.68 ml     LAB RESULTS: CBC  Recent Labs Lab 12/31/12 1152 01/01/13 1723 01/02/13 0550  WBC 6.4 5.5 5.4  HGB 6.5* 6.6* 8.0*  HCT 21.7* 21.7* 25.8*  PLT  --  346 342  MCV 89 88.9 87.2  MCH 26.6 27.0 27.0  MCHC 30.0* 30.4 31.0  RDW 17.1* 17.0* 18.0*  LYMPHSABS 1.2 1.1  --   MONOABS  --  0.5  --   EOSABS 0.1 0.1  --   BASOSABS 0.0 0.0  --     Chemistries   Recent Labs Lab 01/01/13 1723 01/02/13 0550  NA 140 141  K 3.9 3.5  CL 100 102  CO2 33* 29  GLUCOSE 113* 104*  BUN 14 12  CREATININE 0.99 0.98  CALCIUM 9.4 9.4    CBG: No results found for this basename: GLUCAP,  in the last 168 hours  GFR Estimated Creatinine Clearance: 59.3 ml/min (by C-G formula based on Cr of 0.98).  Coagulation profile  Recent Labs Lab 01/02/13 0550  INR 0.99    Cardiac Enzymes No results found for this basename: CK, CKMB, TROPONINI, MYOGLOBIN,  in the last 168 hours  No components found with this basename: POCBNP,  No results found for this basename: DDIMER,  in the last 72 hours No results found for this basename: HGBA1C,  in the last 72 hours No results found for this basename: CHOL, HDL, LDLCALC, TRIG, CHOLHDL, LDLDIRECT,  in the last 72 hours No results found for this basename: TSH, T4TOTAL, FREET3, T3FREE, THYROIDAB,  in the  last 72 hours No results found for this basename: VITAMINB12, FOLATE, FERRITIN, TIBC, IRON, RETICCTPCT,  in the last 72 hours No results found for this basename: LIPASE, AMYLASE,  in the last 72 hours  Urine Studies No results found for this basename: UACOL, UAPR, USPG, UPH, UTP, UGL, UKET, UBIL, UHGB, UNIT, UROB, ULEU, UEPI, UWBC, URBC, UBAC, CAST, CRYS, UCOM, BILUA,  in the last 72 hours  MICROBIOLOGY: No results found for this or any previous visit (from the past 240 hour(s)).  RADIOLOGY STUDIES/RESULTS: No results found.  Jeoffrey Massed, MD  [facility] Pager:[phone]  If 7PM-7AM, please contact night-coverage [email or web address] Password [email or web address] 01/02/2013, 1:51 PM   LOS: 1 day

## 2013-01-02 NOTE — Consult Note (Signed)
[facility] Consultation  Referring Provider:  [facility]    Primary Care Physician:  REED, [name], DO Primary Gastroenterologist:  Jay Pyrtle, MD       Reason for Consultation:    anemia         HPI:   [name] is a 67 y.o. female with multiple medical problems including oxygen dependent COPD . She has a history of colon polyps. Her last colonoscopy was April 2013..   Patient admitted last night with severe anemia. A few weeks ago she had some maroon stools but stools have since cleared. Patient has been tired and SOB lately. She saw PCP who did labs and subsequently sent her to ED. She denies abdominal pain or nausea. She takes a baby asa, no other NSAIDS. For gout she takes Indocin as needed but hasn't had any in several months.   Past Medical History  Diagnosis Date  . Asthma   . COPD (chronic obstructive pulmonary disease)   . Hypertension   . Diabetes mellitus 09/13/2010    glucophage  . Gout   . Oxygen dependent     pt uses O2   . Pericardial effusion 08/2011    small residual on echo  . Hyperlipidemia   . Personal history of noncompliance with medical treatment, presenting hazards to health   . Unspecified disease of pericardium   . Panic disorder without agoraphobia   . Depressive disorder, not elsewhere classified   . Anxiety state, unspecified   . Unspecified hereditary and idiopathic peripheral neuropathy   . Type II or unspecified type diabetes mellitus without mention of complication, not stated as uncontrolled   . Other and unspecified hyperlipidemia   . Obesity, unspecified   . Unspecified tinnitus   . Pneumonia, organism unspecified     Past Surgical History  Procedure Laterality Date  . Breast biopsy      both breasts  . Total abdominal hysterectomy      Family History  Problem Relation Age of Onset  . COPD Mother   . COPD Sister   . Hypertension Sister   . Cancer Father   . Asthma Father   . Diabetes Other     3  maternal aunts  . Prostate cancer Paternal Uncle      History  Substance Use Topics  . Smoking status: Former Smoker -- 1.00 packs/day for 30 years    Types: Cigarettes    Quit date: 06/17/2004  . Smokeless tobacco: Never Used  . Alcohol Use: 1.0 oz/week    2 drink(s) per week     Comment: none iin a month    Prior to Admission medications   Medication Sig Start Date End Date Taking? Authorizing Provider  albuterol (PROVENTIL) (2.5 MG/3ML) 0.083% nebulizer solution Take 2.5 mg by nebulization every 6 (six) hours as needed for shortness of breath.    Yes Historical Provider, MD  aspirin EC 81 MG tablet Take 81 mg by mouth daily.   Yes Historical Provider, MD  budesonide-formoterol (SYMBICORT) 80-4.5 MCG/ACT inhaler Inhale 2 puffs into the lungs 2 (two) times daily. 11/16/12  Yes Murali Ramaswamy, MD  Ferrous Sulfate (IRON) 325 (65 FE) MG TABS Take 1 tablet by mouth daily.     Yes Historical Provider, MD  hydrALAZINE (APRESOLINE) 25 MG tablet Take 1 tablet (25 mg total) by mouth every 8 (eight) hours. 11/10/12  Yes Iskra M Myers, MD  hydrochlorothiazide (HYDRODIURIL) 25 MG tablet Take 1 tablet (25 mg total) by mouth daily. 10/26/12    Yes [name] L Reed, DO  losartan (COZAAR) 100 MG tablet Take 1 tablet (100 mg total) by mouth daily. 09/24/12  Yes [name] L Reed, DO  lovastatin (MEVACOR) 40 MG tablet Take 40 mg by mouth at bedtime.    Yes Historical Provider, MD  metFORMIN (GLUCOPHAGE) 500 MG tablet Take 1 tablet (500 mg total) by mouth 2 (two) times daily with a meal. 12/31/12  Yes [name] L Reed, DO  sertraline (ZOLOFT) 25 MG tablet Take 50 mg by mouth at bedtime.   Yes Historical Provider, MD  tiotropium (SPIRIVA) 18 MCG inhalation capsule Place 18 mcg into inhaler and inhale daily.   Yes Historical Provider, MD  albuterol (PROAIR HFA) 108 (90 BASE) MCG/ACT inhaler Inhale 2 puffs into the lungs every 4 (four) hours as needed for shortness of breath. 12/31/12   [name] L Reed, DO    Current  Facility-Administered Medications  Medication Dose Route Frequency Provider Last Rate Last Dose  . acetaminophen (TYLENOL) tablet 650 mg  650 mg Oral Q6H PRN Pranav Patel, MD       Or  . acetaminophen (TYLENOL) suppository 650 mg  650 mg Rectal Q6H PRN Pranav Patel, MD      . albuterol (PROVENTIL HFA;VENTOLIN HFA) 108 (90 BASE) MCG/ACT inhaler 2 puff  2 puff Inhalation Q4H PRN Pranav Patel, MD      . albuterol (PROVENTIL) (5 MG/ML) 0.5% nebulizer solution 2.5 mg  2.5 mg Nebulization Q4H PRN Pranav Patel, MD      . budesonide-formoterol (SYMBICORT) 80-4.5 MCG/ACT inhaler 2 puff  2 puff Inhalation BID Pranav Patel, MD   2 puff at 01/02/13 0927  . chlorhexidine (PERIDEX) 0.12 % solution 15 mL  15 mL Mouth Rinse BID Pranav Patel, MD      . ferrous sulfate tablet 325 mg  325 mg Oral Daily Pranav Patel, MD   325 mg at 01/02/13 1009  . hydrALAZINE (APRESOLINE) tablet 25 mg  25 mg Oral Q8H Pranav Patel, MD   25 mg at 01/02/13 0600  . hydrochlorothiazide (HYDRODIURIL) tablet 25 mg  25 mg Oral Daily Pranav Patel, MD   25 mg at 01/02/13 1009  . losartan (COZAAR) tablet 100 mg  100 mg Oral Daily Pranav Patel, MD   100 mg at 01/02/13 1009  . metFORMIN (GLUCOPHAGE) tablet 500 mg  500 mg Oral BID WC Pranav Patel, MD      . sertraline (ZOLOFT) tablet 50 mg  50 mg Oral QHS Pranav Patel, MD   50 mg at 01/01/13 2249  . sodium chloride 0.9 % injection 3 mL  3 mL Intravenous Q12H Pranav Patel, MD      . tiotropium (SPIRIVA) inhalation capsule 18 mcg  18 mcg Inhalation Daily Pranav Patel, MD   18 mcg at 01/02/13 0927    Allergies as of 01/01/2013 - Review Complete 01/01/2013  Allergen Reaction Noted  . Lisinopril Swelling 01/29/2011  . Advair diskus (fluticasone-salmeterol) Other (See Comments) 08/08/2011  . Penicillins  09/13/2010    Review of Systems:    All systems reviewed and negative except where noted in HPI.   Physical Exam:  Vital signs in last 24 hours: Temp:  [97.6 F (36.4 C)-98.8 F (37.1 C)]  98.4 F (36.9 C) (07/19 0533) Pulse Rate:  [81-109] 104 (07/19 0533) Resp:  [18-26] 18 (07/19 0533) BP: (149-168)/(77-102) 151/85 mmHg (07/19 0533) SpO2:  [95 %-100 %] 95 % (07/19 0533) FiO2 (%):  [2 %] 2 % (07/18 1807) Weight:  [193 lb 14.4 oz (  87.952 kg)] 193 lb 14.4 oz (87.952 kg) (07/18 2048) Last BM Date: 12/31/12 General:   Pleasant black female in NAD Head:  Normocephalic and atraumatic. Eyes:   No icterus.   Conjunctiva pink. Ears:  Normal auditory acuity. Neck:  Supple; no masses felt Lungs:  Respirations even and unlabored. Lungs clear to auscultation bilaterally.   No wheezes, crackles, or rhonchi.  Heart:  Regular rate and rhythm. Abdomen:  Soft, nondistended, nontender. Normal bowel sounds. No appreciable masses or hepatomegaly.  Msk:  Symmetrical without gross deformities.  Extremities:  Without edema. Neurologic:  Alert and  oriented x4;  grossly normal neurologically. Skin:  Intact without significant lesions or rashes. Cervical Nodes:  No significant cervical adenopathy. Psych:  Alert and cooperative. Normal affect.  LAB RESULTS:  Recent Labs  12/31/12 1152 01/01/13 1723 01/02/13 0550  WBC 6.4 5.5 5.4  HGB 6.5* 6.6* 8.0*  HCT 21.7* 21.7* 25.8*  PLT  --  346 342   BMET  Recent Labs  01/01/13 1723 01/02/13 0550  NA 140 141  K 3.9 3.5  CL 100 102  CO2 33* 29  GLUCOSE 113* 104*  BUN 14 12  CREATININE 0.99 0.98  CALCIUM 9.4 9.4    PREVIOUS ENDOSCOPIES:            colonoscopy April 2013 (Pyrtle). Findings included a nonbleeding cecal AVM, diverticulosis, hemorrhoids and polyps (adenomas).    Impression / Plan:   1. Pleasant 67 year old black female admitted with severe anemia and history of maroon stools a few weeks ago (now resolved). Rule out PUD, intestinal AVMs. She needs EGD for further evaluation. The benefits, risks, and potential complications of EGD with possible biopsies were discussed with the patient and she agrees to proceed.  Will plan  for EGD tomorrow. If negative will probably need to pursue colonoscopy as patient has a known history of a cecal AVM seen on colonoscopy last week.   2. Acute on chronic anemia. Baseline hgb mid 9 to 10. Hgb on admission 6.5, up to 8.0 after 2 units of blood.   3. History of adenomatous colon polyps, up to date on screening exam.  4. Multiple medical problems as listed in PMH  Thanks   LOS: 1 day   Paula Guenther  01/02/2013, 11:56 AM  Chart was reviewed and patient was examined. X-rays and lab were reviewed.    I agree with management and plans. Subacute GI bleeding causing severe anemia. Differential diagnosis as above including active peptic ulcer disease, less likely upper GI neoplasm, or AVMs. Plan as outlined above.  Robert D. Kaplan, M.D., FACG  Gastro[facility]ology Cell [phone]      

## 2013-01-02 NOTE — Progress Notes (Signed)
Referral for medication assistance passed along to RN CM.  Please re-consult if CSW needs should arise.

## 2013-01-03 ENCOUNTER — Encounter (HOSPITAL_COMMUNITY)
Admission: EM | Disposition: A | Discharge status: Home / Self Care | Payer: Self-pay | Source: Home / Self Care | Attending: Internal Medicine

## 2013-01-03 ENCOUNTER — Encounter (HOSPITAL_COMMUNITY): Payer: Self-pay

## 2013-01-03 DIAGNOSIS — K31819 Angiodysplasia of stomach and duodenum without bleeding: Secondary | ICD-10-CM | POA: Diagnosis present

## 2013-01-03 DIAGNOSIS — Q2733 Arteriovenous malformation of digestive system vessel: Secondary | ICD-10-CM

## 2013-01-03 HISTORY — PX: ESOPHAGOGASTRODUODENOSCOPY: SHX5428

## 2013-01-03 LAB — CBC
HCT: 27 % — ABNORMAL LOW (ref 36.0–46.0)
MCH: 27.4 pg (ref 26.0–34.0)
MCV: 89.1 fL (ref 78.0–100.0)
Platelets: 342 10*3/uL (ref 150–400)
RBC: 3.03 MIL/uL — ABNORMAL LOW (ref 3.87–5.11)

## 2013-01-03 LAB — GLUCOSE, CAPILLARY
Glucose-Capillary: 107 mg/dL — ABNORMAL HIGH (ref 70–99)
Glucose-Capillary: 144 mg/dL — ABNORMAL HIGH (ref 70–99)

## 2013-01-03 SURGERY — EGD (ESOPHAGOGASTRODUODENOSCOPY)
Anesthesia: Moderate Sedation

## 2013-01-03 MED ORDER — MIDAZOLAM HCL 5 MG/5ML IJ SOLN
INTRAMUSCULAR | Status: DC | PRN
  Administered 2013-01-03 (×2): 2 mg via INTRAVENOUS
  Administered 2013-01-03: 1 mg via INTRAVENOUS

## 2013-01-03 MED ORDER — FENTANYL CITRATE 0.05 MG/ML IJ SOLN
INTRAMUSCULAR | Status: DC | PRN
  Administered 2013-01-03 (×3): 25 ug via INTRAVENOUS

## 2013-01-03 MED ORDER — FENTANYL CITRATE 0.05 MG/ML IJ SOLN
INTRAMUSCULAR | Status: AC
  Filled 2013-01-03: qty 2

## 2013-01-03 MED ORDER — MIDAZOLAM HCL 5 MG/ML IJ SOLN
INTRAMUSCULAR | Status: AC
  Filled 2013-01-03: qty 2

## 2013-01-03 MED ORDER — BUTAMBEN-TETRACAINE-BENZOCAINE 2-2-14 % EX AERO
INHALATION_SPRAY | CUTANEOUS | Status: DC | PRN
  Administered 2013-01-03: 2 via TOPICAL

## 2013-01-03 NOTE — H&P (View-Only) (Signed)
[facility] Consultation  Referring Provider:  [facility]    Primary Care Physician:  Bufford Spikes, DO Primary Gastroenterologist:  Erick Blinks, MD       Reason for Consultation:    anemia         HPI:   [name] is a 67 y.o. female with multiple medical problems including oxygen dependent COPD . She has a history of colon polyps. Her last colonoscopy was April 2013.[name]   Patient admitted last night with severe anemia. A few weeks ago she had some maroon stools but stools have since cleared. Patient has been tired and SOB lately. She saw PCP who did labs and subsequently sent her to ED. She denies abdominal pain or nausea. She takes a baby asa, no other NSAIDS. For gout she takes Indocin as needed but hasn't had any in several months.   Past Medical History  Diagnosis Date  . Asthma   . COPD (chronic obstructive pulmonary disease)   . Hypertension   . Diabetes mellitus 09/13/2010    glucophage  . Gout   . Oxygen dependent     pt uses O2   . Pericardial effusion 08/2011    small residual on echo  . Hyperlipidemia   . Personal history of noncompliance with medical treatment, presenting hazards to health   . Unspecified disease of pericardium   . Panic disorder without agoraphobia   . Depressive disorder, not elsewhere classified   . Anxiety state, unspecified   . Unspecified hereditary and idiopathic peripheral neuropathy   . Type II or unspecified type diabetes mellitus without mention of complication, not stated as uncontrolled   . Other and unspecified hyperlipidemia   . Obesity, unspecified   . Unspecified tinnitus   . Pneumonia, organism unspecified     Past Surgical History  Procedure Laterality Date  . Breast biopsy      both breasts  . Total abdominal hysterectomy      Family History  Problem Relation Age of Onset  . COPD Mother   . COPD Sister   . Hypertension Sister   . Cancer Father   . Asthma Father   . Diabetes Other     3  maternal aunts  . Prostate cancer Paternal Uncle      History  Substance Use Topics  . Smoking status: Former Smoker -- 1.00 packs/day for 30 years    Types: Cigarettes    Quit date: 06/17/2004  . Smokeless tobacco: Never Used  . Alcohol Use: 1.0 oz/week    2 drink(s) per week     Comment: none iin a month    Prior to Admission medications   Medication Sig Start Date End Date Taking? Authorizing Provider  albuterol (PROVENTIL) (2.5 MG/3ML) 0.083% nebulizer solution Take 2.5 mg by nebulization every 6 (six) hours as needed for shortness of breath.    Yes Historical Provider, MD  aspirin EC 81 MG tablet Take 81 mg by mouth daily.   Yes Historical Provider, MD  budesonide-formoterol (SYMBICORT) 80-4.5 MCG/ACT inhaler Inhale 2 puffs into the lungs 2 (two) times daily. 11/16/12  Yes Kalman Shan, MD  Ferrous Sulfate (IRON) 325 (65 FE) MG TABS Take 1 tablet by mouth daily.     Yes Historical Provider, MD  hydrALAZINE (APRESOLINE) 25 MG tablet Take 1 tablet (25 mg total) by mouth every 8 (eight) hours. 11/10/12  Yes Dorothea Ogle, MD  hydrochlorothiazide (HYDRODIURIL) 25 MG tablet Take 1 tablet (25 mg total) by mouth daily. 10/26/12  Yes [name] L Reed, DO  losartan (COZAAR) 100 MG tablet Take 1 tablet (100 mg total) by mouth daily. 09/24/12  Yes [name] L Reed, DO  lovastatin (MEVACOR) 40 MG tablet Take 40 mg by mouth at bedtime.    Yes Historical Provider, MD  metFORMIN (GLUCOPHAGE) 500 MG tablet Take 1 tablet (500 mg total) by mouth 2 (two) times daily with a meal. 12/31/12  Yes [name] L Reed, DO  sertraline (ZOLOFT) 25 MG tablet Take 50 mg by mouth at bedtime.   Yes Historical Provider, MD  tiotropium (SPIRIVA) 18 MCG inhalation capsule Place 18 mcg into inhaler and inhale daily.   Yes Historical Provider, MD  albuterol (PROAIR HFA) 108 (90 BASE) MCG/ACT inhaler Inhale 2 puffs into the lungs every 4 (four) hours as needed for shortness of breath. 12/31/12   [name] L Reed, DO    Current  Facility-Administered Medications  Medication Dose Route Frequency Provider Last Rate Last Dose  . acetaminophen (TYLENOL) tablet 650 mg  650 mg Oral Q6H PRN Courtney Oxford, MD       Or  . acetaminophen (TYLENOL) suppository 650 mg  650 mg Rectal Q6H PRN Courtney Oxford, MD      . albuterol (PROVENTIL HFA;VENTOLIN HFA) 108 (90 BASE) MCG/ACT inhaler 2 puff  2 puff Inhalation Q4H PRN Courtney Oxford, MD      . albuterol (PROVENTIL) (5 MG/ML) 0.5% nebulizer solution 2.5 mg  2.5 mg Nebulization Q4H PRN Courtney Oxford, MD      . budesonide-formoterol (SYMBICORT) 80-4.5 MCG/ACT inhaler 2 puff  2 puff Inhalation BID Courtney Oxford, MD   2 puff at 01/02/13 0927  . chlorhexidine (PERIDEX) 0.12 % solution 15 mL  15 mL Mouth Rinse BID Courtney Oxford, MD      . ferrous sulfate tablet 325 mg  325 mg Oral Daily Courtney Oxford, MD   325 mg at 01/02/13 1009  . hydrALAZINE (APRESOLINE) tablet 25 mg  25 mg Oral Q8H Courtney Oxford, MD   25 mg at 01/02/13 0600  . hydrochlorothiazide (HYDRODIURIL) tablet 25 mg  25 mg Oral Daily Courtney Oxford, MD   25 mg at 01/02/13 1009  . losartan (COZAAR) tablet 100 mg  100 mg Oral Daily Courtney Oxford, MD   100 mg at 01/02/13 1009  . metFORMIN (GLUCOPHAGE) tablet 500 mg  500 mg Oral BID WC Courtney Oxford, MD      . sertraline (ZOLOFT) tablet 50 mg  50 mg Oral QHS Courtney Oxford, MD   50 mg at 01/01/13 2249  . sodium chloride 0.9 % injection 3 mL  3 mL Intravenous Q12H Courtney Oxford, MD      . tiotropium [facility]) inhalation capsule 18 mcg  18 mcg Inhalation Daily Courtney Oxford, MD   18 mcg at 01/02/13 2130    Allergies as of 01/01/2013 - Review Complete 01/01/2013  Allergen Reaction Noted  . Lisinopril Swelling 01/29/2011  . Advair diskus (fluticasone-salmeterol) Other (See Comments) 08/08/2011  . Penicillins  09/13/2010    Review of Systems:    All systems reviewed and negative except where noted in HPI.   Physical Exam:  Vital signs in last 24 hours: Temp:  [97.6 F (36.4 C)-98.8 F (37.1 C)]  98.4 F (36.9 C) (07/19 0533) Pulse Rate:  [81-109] 104 (07/19 0533) Resp:  [18-26] 18 (07/19 0533) BP: (149-168)/(77-102) 151/85 mmHg (07/19 0533) SpO2:  [95 %-100 %] 95 % (07/19 0533) FiO2 (%):  [2 %] 2 % (07/18 1807) Weight:  [193 lb 14.4 oz (  87.952 kg)] 193 lb 14.4 oz (87.952 kg) (07/18 2048) Last BM Date: 12/31/12 General:   Pleasant black female in NAD Head:  Normocephalic and atraumatic. Eyes:   No icterus.   Conjunctiva pink. Ears:  Normal auditory acuity. Neck:  Supple; no masses felt Lungs:  Respirations even and unlabored. Lungs clear to auscultation bilaterally.   No wheezes, crackles, or rhonchi.  Heart:  Regular rate and rhythm. Abdomen:  Soft, nondistended, nontender. Normal bowel sounds. No appreciable masses or hepatomegaly.  Msk:  Symmetrical without gross deformities.  Extremities:  Without edema. Neurologic:  Alert and  oriented x4;  grossly normal neurologically. Skin:  Intact without significant lesions or rashes. Cervical Nodes:  No significant cervical adenopathy. Psych:  Alert and cooperative. Normal affect.  LAB RESULTS:  Recent Labs  12/31/12 1152 01/01/13 1723 01/02/13 0550  WBC 6.4 5.5 5.4  HGB 6.5* 6.6* 8.0*  HCT 21.7* 21.7* 25.8*  PLT  --  346 342   BMET  Recent Labs  01/01/13 1723 01/02/13 0550  NA 140 141  K 3.9 3.5  CL 100 102  CO2 33* 29  GLUCOSE 113* 104*  BUN 14 12  CREATININE 0.99 0.98  CALCIUM 9.4 9.4    PREVIOUS ENDOSCOPIES:            colonoscopy April 2013 (Pyrtle). Findings included a nonbleeding cecal AVM, diverticulosis, hemorrhoids and polyps (adenomas).    Impression / Plan:   1. Pleasant 67 year old black female admitted with severe anemia and history of maroon stools a few weeks ago (now resolved). Rule out PUD, intestinal AVMs. She needs EGD for further evaluation. The benefits, risks, and potential complications of EGD with possible biopsies were discussed with the patient and she agrees to proceed.  Will plan  for EGD tomorrow. If negative will probably need to pursue colonoscopy as patient has a known history of a cecal AVM seen on colonoscopy last week.   2. Acute on chronic anemia. Baseline hgb mid 9 to 10. Hgb on admission 6.5, up to 8.0 after 2 units of blood.   3. History of adenomatous colon polyps, up to date on screening exam.  4. Multiple medical problems as listed in PMH  Thanks   LOS: 1 day   Willette Cluster  01/02/2013, 11:56 AM  Chart was reviewed and patient was examined. X-rays and lab were reviewed.    I agree with management and plans. Subacute GI bleeding causing severe anemia. Differential diagnosis as above including active peptic ulcer disease, less likely upper GI neoplasm, or AVMs. Plan as outlined above.  Barbette Hair. Arlyce Dice, M.D., [facility] Cell [phone]

## 2013-01-03 NOTE — Progress Notes (Signed)
PATIENT DETAILS Name: [name] Age: 67 y.o. Sex: female Date of Birth: 1946/01/24 Admit Date: 01/01/2013 Admitting Physician Lynden Oxford, MD NGE:XBMW, [name], DO  Subjective: No major events overnight-no Bowel movement since admission  Assessment/Plan: Principal Problem:   GI bleed -suspected recent UGI bleed-however does have hx of Cecal AVM's-so cannot be sure.No Bleed while hospitalized so far. -no current bleeding, last melanotic stool was 3-4 days back prior to admission -GI consulted-planning EGD today -c/w PPI -further plan will depend on EGD results  Active Problems: Subacute Blood Loss anemia -Transfused 2 units PRBC on admission, with appropriate rise in Hb -monitor H/H-Hb stable at 8.3    COPD (chronic obstructive pulmonary disease) -lungs clear on exam -stable -c/w Spiriva and prn Albuterol  DM -hold Metformin while inpatient -c/w SSI -CBG's stable    Mixed hyperlipidemia -resume Statins on discharge    Essential hypertension, benign -BP stable -c/w HCTZ, Losartan,and Hydralazine  Disposition: Remain inpatient  DVT Prophylaxis:  SCD's  Code Status: Full code   Family Communication None at bedside  Procedures:  None  CONSULTS:  GI   MEDICATIONS: Scheduled Meds: . [MAR HOLD] budesonide-formoterol  2 puff Inhalation BID  . [facility] HOLD] chlorhexidine  15 mL Mouth Rinse BID  . [facility] HOLD] ferrous sulfate  325 mg Oral Daily  . [MAR HOLD] hydrALAZINE  25 mg Oral Q8H  . [facility] HOLD] hydrochlorothiazide  25 mg Oral Daily  . [MAR HOLD] insulin aspart  0-9 Units Subcutaneous TID WC  . [MAR HOLD] losartan  100 mg Oral Daily  . [MAR HOLD] pantoprazole  40 mg Oral Q1200  . [facility] HOLD] sertraline  50 mg Oral QHS  . [MAR HOLD] sodium chloride  3 mL Intravenous Q12H  . [MAR HOLD] tiotropium  18 mcg Inhalation Daily   Continuous Infusions:  PRN Meds:.[MAR HOLD] acetaminophen, [MAR HOLD] acetaminophen, [MAR HOLD] albuterol, [MAR HOLD]  albuterol, butamben-tetracaine-benzocaine, fentaNYL, midazolam  Antibiotics: Anti-infectives   None       PHYSICAL EXAM: Vital signs in last 24 hours: Filed Vitals:   01/03/13 1005 01/03/13 1010 01/03/13 1015 01/03/13 1030  BP:    135/70  Pulse:      Temp:      TempSrc:      Resp:    23  Height:      Weight:      SpO2: 99% 97% 96% 96%    Weight change:  Filed Weights   01/01/13 2048  Weight: 87.952 kg (193 lb 14.4 oz)   Body mass index is 33.8 kg/(m^2).   Gen Exam: Awake and alert with clear speech.   Neck: Supple, No JVD.   Chest: B/L Clear.   CVS: S1 S2 Regular, no murmurs.  Abdomen: soft, BS +, non tender, non distended.  Extremities: no edema, lower extremities warm to touch. Neurologic: Non Focal.   Skin: No Rash.   Wounds: N/A.    Intake/Output from previous day: No intake or output data in the 24 hours ending 01/03/13 1031   LAB RESULTS: CBC  Recent Labs Lab 12/31/12 1152 01/01/13 1723 01/02/13 0550 01/03/13 0720  WBC 6.4 5.5 5.4 6.0  HGB 6.5* 6.6* 8.0* 8.3*  HCT 21.7* 21.7* 25.8* 27.0*  PLT  --  346 342 342  MCV 89 88.9 87.2 89.1  MCH 26.6 27.0 27.0 27.4  MCHC 30.0* 30.4 31.0 30.7  RDW 17.1* 17.0* 18.0* 17.9*  LYMPHSABS 1.2 1.1  --   --   MONOABS  --  0.5  --   --  EOSABS 0.1 0.1  --   --   BASOSABS 0.0 0.0  --   --     Chemistries   Recent Labs Lab 01/01/13 1723 01/02/13 0550  NA 140 141  K 3.9 3.5  CL 100 102  CO2 33* 29  GLUCOSE 113* 104*  BUN 14 12  CREATININE 0.99 0.98  CALCIUM 9.4 9.4    CBG:  Recent Labs Lab 01/02/13 1802 01/02/13 2151 01/03/13 0758  GLUCAP 109* 109* 117*    GFR Estimated Creatinine Clearance: 59.3 ml/min (by C-G formula based on Cr of 0.98).  Coagulation profile  Recent Labs Lab 01/02/13 0550  INR 0.99    Cardiac Enzymes No results found for this basename: CK, CKMB, TROPONINI, MYOGLOBIN,  in the last 168 hours  No components found with this basename: POCBNP,  No results found  for this basename: DDIMER,  in the last 72 hours No results found for this basename: HGBA1C,  in the last 72 hours No results found for this basename: CHOL, HDL, LDLCALC, TRIG, CHOLHDL, LDLDIRECT,  in the last 72 hours No results found for this basename: TSH, T4TOTAL, FREET3, T3FREE, THYROIDAB,  in the last 72 hours No results found for this basename: VITAMINB12, FOLATE, FERRITIN, TIBC, IRON, RETICCTPCT,  in the last 72 hours No results found for this basename: LIPASE, AMYLASE,  in the last 72 hours  Urine Studies No results found for this basename: UACOL, UAPR, USPG, UPH, UTP, UGL, UKET, UBIL, UHGB, UNIT, UROB, ULEU, UEPI, UWBC, URBC, UBAC, CAST, CRYS, UCOM, BILUA,  in the last 72 hours  MICROBIOLOGY: No results found for this or any previous visit (from the past 240 hour(s)).  RADIOLOGY STUDIES/RESULTS: No results found.  Jeoffrey Massed, MD  [facility] Pager:[phone]  If 7PM-7AM, please contact night-coverage [email or web address] Password [facility] 01/03/2013, 10:31 AM   LOS: 2 days

## 2013-01-03 NOTE — Op Note (Signed)
[facility] [address] [place], [address]   ENDOSCOPY PROCEDURE REPORT  PATIENT: [name], [name]  MR#: [id number] BIRTHDATE: 01/02/1946 , 67  yrs. old GENDER: Female ENDOSCOPIST: Louis Meckel, MD REFERRED BY:  Bufford Spikes, M.D. PROCEDURE DATE:  01/03/2013 PROCEDURE:  EGD w/ control of bleeding ASA CLASS:     Class II INDICATIONS:  Melena. MEDICATIONS: These medications were titrated to patient response per physician's verbal order, Versed 5 mg IV, and Fentanyl-Detailed 75 mcg IV TOPICAL ANESTHETIC: Cetacaine Spray  DESCRIPTION OF PROCEDURE: After the risks benefits and alternatives of the procedure were thoroughly explained, informed consent was obtained.  The Pentax Gastroscope [id number] endoscope was introduced through the mouth and advanced to the third portion of the duodenum. Without limitations.  The instrument was slowly withdrawn as the mucosa was fully examined.      In the duodenal bulb along the posterior wall there was a nonbleeding 2 mm AVM.  A gastric AVM was seen at the incisura measurin 1 mm.  There was no fresh or old blood.  Each AVM was fulgurated utilizing the argon plasma coagulator.   In the duodenal bulb along the posterior wall there was a nonbleeding 2 mm AVM.  A gastric AVM was seen at the incisura measurin 1 mm.  There was no fresh or old blood.  Each AVM was fulgurated utilizing the argon plasma coagulator.   The remainder of the upper endoscopy exam was otherwise normal.  Retroflexed views revealed no abnormalities. The scope was then withdrawn from the patient and the procedure completed.  COMPLICATIONS: There were no complications. ENDOSCOPIC IMPRESSION: 1.   gastric and duodenal AVMs-status post fulguration with the argon plasma coagulator  RECOMMENDATIONS: if patient has rere bleeding then she will need colonoscopy for treatment of known cecal AVM REPEAT EXAM:  eSigned:  Louis Meckel, MD  01/03/2013 10:35 AM   [id number], Vonna Kotyk MD and Zelphia Cairo MD

## 2013-01-03 NOTE — Progress Notes (Signed)
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 Each was fulgurated. This could be the source for her GI bleeding. 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 Should she have any recurrent bleeding she will require repeat colonoscopy.

## 2013-01-03 NOTE — Interval H&P Note (Signed)
History and Physical Interval Note:  01/03/2013 9:56 AM  Chip Boer  has presented today for surgery, with the diagnosis of anemia, GI bleed  The various methods of treatment have been discussed with the patient and family. After consideration of risks, benefits and other options for treatment, the patient has consented to  Procedure(s): ESOPHAGOGASTRODUODENOSCOPY (EGD) (N/A) as a surgical intervention .  The patient's history has been reviewed, patient examined, no change in status, stable for surgery.  I have reviewed the patient's chart and labs.  Questions were answered to the patient's satisfaction.    The recent H&P (dated *01/02/13**) was reviewed, the patient was examined and there is no change in the patients condition since that H&P was completed.   Melvia Heaps  01/03/2013, 9:56 AM    Melvia Heaps

## 2013-01-04 ENCOUNTER — Other Ambulatory Visit: Payer: Medicare Other

## 2013-01-04 ENCOUNTER — Encounter (HOSPITAL_COMMUNITY): Payer: Self-pay | Admitting: Gastroenterology

## 2013-01-04 DIAGNOSIS — K5520 Angiodysplasia of colon without hemorrhage: Secondary | ICD-10-CM

## 2013-01-04 DIAGNOSIS — D500 Iron deficiency anemia secondary to blood loss (chronic): Secondary | ICD-10-CM | POA: Diagnosis present

## 2013-01-04 HISTORY — DX: Iron deficiency anemia secondary to blood loss (chronic): D50.0

## 2013-01-04 LAB — HEMOGLOBIN AND HEMATOCRIT, BLOOD
HCT: 25.1 % — ABNORMAL LOW (ref 36.0–46.0)
Hemoglobin: 7.8 g/dL — ABNORMAL LOW (ref 12.0–15.0)

## 2013-01-04 LAB — GLUCOSE, CAPILLARY

## 2013-01-04 LAB — PREPARE RBC (CROSSMATCH)

## 2013-01-04 MED ORDER — PEG-KCL-NACL-NASULF-NA ASC-C 100 G PO SOLR: 1.0000 | Freq: Once | ORAL | Status: DC

## 2013-01-04 MED ORDER — PEG-KCL-NACL-NASULF-NA ASC-C 100 G PO SOLR
0.5000 | Freq: Once | ORAL | Status: AC
  Administered 2013-01-04: 50 g via ORAL
  Filled 2013-01-04: qty 1

## 2013-01-04 MED ORDER — SODIUM CHLORIDE 0.9 % IV SOLN
INTRAVENOUS | Status: DC
  Administered 2013-01-04: 250 mL via INTRAVENOUS
  Administered 2013-01-05 (×2): via INTRAVENOUS

## 2013-01-04 MED ORDER — PEG-KCL-NACL-NASULF-NA ASC-C 100 G PO SOLR
0.5000 | Freq: Once | ORAL | Status: AC
  Administered 2013-01-05: 50 g via ORAL
  Filled 2013-01-04: qty 1

## 2013-01-04 MED ORDER — FUROSEMIDE 10 MG/ML IJ SOLN
20.0000 mg | Freq: Once | INTRAMUSCULAR | Status: AC
  Administered 2013-01-04: 20 mg via INTRAVENOUS
  Filled 2013-01-04: qty 2

## 2013-01-04 NOTE — Progress Notes (Signed)
PATIENT DETAILS Name: [name] Age: 67 y.o. Sex: female Date of Birth: 06/30/1945 Admit Date: 01/01/2013 Admitting Physician Lynden Oxford, MD [id number], [name], DO  Subjective: One large black bowel movement yesterday after endoscopy.  Patient feels very tired.  Lives at home alone.  Has not walked since admission.  Reports her breathing is at baseline.  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 - Hx of Cecal AVM - if she continues to bleed she will need a colonoscopy. -c/w PPI  Active Problems: Acute Blood Loss anemia -Transfused 2 units PRBC on admission, with appropriate rise in Hb -Transfuse 1 units on 7/21 as patient is very fatigued, and has increased work of breathing. -Baseline hgb appears to be 10.0, Hgb on 7/21 is 7.8   COPD (chronic obstructive pulmonary disease) -lungs clear on exam -stable -c/w Spiriva and prn Albuterol  DM -hold Metformin while inpatient, resume on discharge -c/w SSI -CBG's stable    Mixed hyperlipidemia -resume Statins on discharge    Essential hypertension, benign -BP stable -c/w HCTZ, Losartan,and Hydralazine  Disposition: Remain inpatient PT Consultation requested - patient from home alone.   Likely D/C 7/22  DVT Prophylaxis:  SCD's  Code Status: Full code   Family Communication None at bedside  Procedures:  None  CONSULTS:  GI   MEDICATIONS: Scheduled Meds: . budesonide-formoterol  2 puff Inhalation BID  . chlorhexidine  15 mL Mouth Rinse BID  . ferrous sulfate  325 mg Oral Daily  . furosemide  20 mg Intravenous Once  . hydrALAZINE  25 mg Oral Q8H  . hydrochlorothiazide  25 mg Oral Daily  . insulin aspart  0-9 Units Subcutaneous TID WC  . losartan  100 mg Oral Daily  . pantoprazole  40 mg Oral Q1200  . sertraline  50 mg Oral QHS  . sodium chloride  3 mL Intravenous Q12H  . tiotropium  18 mcg Inhalation Daily    Continuous Infusions:  PRN Meds:.acetaminophen, acetaminophen, albuterol, albuterol  Antibiotics: Anti-infectives   None       PHYSICAL EXAM: Vital signs in last 24 hours: Filed Vitals:   01/03/13 1045 01/03/13 1410 01/03/13 2124 01/04/13 0537  BP: 140/73 131/76 145/75 142/88  Pulse:      Temp:  99.1 F (37.3 C) 98.5 F (36.9 C) 98.3 F (36.8 C)  TempSrc:  Oral Oral Oral  Resp: 24 22 20 18   Height:      Weight:      SpO2: 97% 90% 93% 92%    Weight change:  Filed Weights   01/01/13 2048  Weight: 87.952 kg (193 lb 14.4 oz)   Body mass index is 33.8 kg/(m^2).   Gen Exam: Awake and alert with clear speech.  Pleasant Neck: Supple, No JVD.   Chest: B/L Clear.  Wearing o2, with some increased work of breathing (patient states this is baseline) CVS: S1 S2 Regular, no murmurs.  Abdomen: soft, BS +, non tender, non distended.  Extremities: no edema, lower extremities warm to touch. Neurologic: Non Focal.   Skin: No Rash.      Intake/Output from previous day:  Intake/Output Summary (Last 24 hours) at 01/04/13 0823 Last data filed at 01/03/13 1430  Gross per 24 hour  Intake      0 ml  Output      1 ml  Net     -1 ml     LAB RESULTS: CBC  Recent Labs  Lab 12/31/12 1152 01/01/13 1723 01/02/13 0550 01/03/13 0720 01/04/13 0014  WBC 6.4 5.5 5.4 6.0  --   HGB 6.5* 6.6* 8.0* 8.3* 7.8*  HCT 21.7* 21.7* 25.8* 27.0* 25.1*  PLT  --  346 342 342  --   MCV 89 88.9 87.2 89.1  --   MCH 26.6 27.0 27.0 27.4  --   MCHC 30.0* 30.4 31.0 30.7  --   RDW 17.1* 17.0* 18.0* 17.9*  --   LYMPHSABS 1.2 1.1  --   --   --   MONOABS  --  0.5  --   --   --   EOSABS 0.1 0.1  --   --   --   BASOSABS 0.0 0.0  --   --   --     Chemistries   Recent Labs Lab 01/01/13 1723 01/02/13 0550  NA 140 141  K 3.9 3.5  CL 100 102  CO2 33* 29  GLUCOSE 113* 104*  BUN 14 12  CREATININE 0.99 0.98  CALCIUM 9.4 9.4    CBG:  Recent Labs Lab 01/03/13 0758 01/03/13 1145  01/03/13 1713 01/03/13 2148 01/04/13 0734  GLUCAP 117* 107* 136* 144* 119*    GFR Estimated Creatinine Clearance: 59.3 ml/min (by C-G formula based on Cr of 0.98).  Coagulation profile  Recent Labs Lab 01/02/13 0550  INR 0.99     RADIOLOGY STUDIES/RESULTS: No results found.  [place] [place] [facility] Pager:[phone]  If 7PM-7AM, please contact night-coverage [email or web address] Password [email or web address] 01/04/2013, 8:23 AM   LOS: 3 days    Attending Patient seen and examined, appreciate GI assistance. Claims to have malaise, tiredness and slight worsening of baseline SOB-?symptomatic anemia-transfuse 1 unit of PRBC and see if improvement in symptoms  Windell Norfolk MD

## 2013-01-04 NOTE — Progress Notes (Signed)
     [name] Daily Rounding Note 01/04/2013, 11:54 AM  SUBJECTIVE:       Receiving unit PRBC (# 3  This admit).  Dark, not tarry, stools. On solids.  Feels dizzy for short time when she gets up .  Also SOB with walking.  No nausea  OBJECTIVE:         Vital signs in last 24 hours:    Temp:  [98.1 F (36.7 C)-99.1 F (37.3 C)] 98.3 F (36.8 C) (07/21 1055) Pulse Rate:  [94-100] 100 (07/21 1055) Resp:  [18-22] 22 (07/21 1055) BP: (119-145)/(69-88) 119/69 mmHg (07/21 1055) SpO2:  [90 %-96 %] 96 % (07/21 1028) Last BM Date: 01/03/13 General: looks well.    Heart: RRR.  Slight tachy. Chest: clear.  No dyspnea Abdomen: soft, not tender, active BS  Extremities: no pedal edema Neuro/Psych:  Not confused, relaxed.    Lab Results:  Recent Labs  01/01/13 1723 01/02/13 0550 01/03/13 0720 01/04/13 0014  WBC 5.5 5.4 6.0  --   HGB 6.6* 8.0* 8.3* 7.8*  HCT 21.7* 25.8* 27.0* 25.1*  PLT 346 342 342  --       ASSESMENT: * recurrent blood loss anemia, severe.  Non-bleeding gastric and duodenal AVMS were ablated 7/20.  Known cecal AVM may be the culprit.      PLAN: *  Colonoscopy tomorrow?  Will confer with Dr Leone Payor.    LOS: 3 days   [name]  01/04/2013, 11:54 AM Pager: [phone]   [name] Attending  I have also seen and assessed the patient and agree with the above note. Given known cecal AVM I think it makes sense to treat that so she will have colonoscopy and AVM ablation tomorrow. Possible dc home after. The risks and benefits as well as alternatives of endoscopic procedure(s) have been discussed and reviewed. All questions answered. The patient agrees to proceed.   Iva Boop, MD, [facility] [phone] (pager) 01/04/2013 8:12 PM

## 2013-01-04 NOTE — Progress Notes (Signed)
PT Cancellation Note  Patient Details Name: [name] MRN: [id number] DOB: Aug 26, 1945   Cancelled Treatment:    Reason Eval/Treat Not Completed: Medical issues which prohibited therapy. PT eval received, chart reviewed. Pt's Hg is currently 7.8 g/dL and is scheduled for blood transfusion this am. Pt also has active bedrest orders. Will continue to follow and attempt evaluation again this afternoon time permitting.   Ruthann Cancer 01/04/2013, 9:17 AM  Ruthann Cancer, PT Acute Rehabilitation Services

## 2013-01-04 NOTE — Progress Notes (Signed)
Utilization review completed. Niyam Bisping RN CCM Case Mgmt phone [phone] 

## 2013-01-05 ENCOUNTER — Encounter (HOSPITAL_COMMUNITY): Payer: Self-pay

## 2013-01-05 ENCOUNTER — Encounter (HOSPITAL_COMMUNITY)
Admission: EM | Disposition: A | Discharge status: Home / Self Care | Payer: Self-pay | Source: Home / Self Care | Attending: Internal Medicine

## 2013-01-05 HISTORY — PX: COLONOSCOPY: SHX5424

## 2013-01-05 LAB — TYPE AND SCREEN
ABO/RH(D): O POS
Antibody Screen: NEGATIVE
Unit division: 0
Unit division: 0

## 2013-01-05 LAB — CBC
HCT: 29.1 % — ABNORMAL LOW (ref 36.0–46.0)
MCV: 87.9 fL (ref 78.0–100.0)
Platelets: 297 10*3/uL (ref 150–400)
RBC: 3.31 MIL/uL — ABNORMAL LOW (ref 3.87–5.11)
WBC: 8.4 10*3/uL (ref 4.0–10.5)

## 2013-01-05 LAB — GLUCOSE, CAPILLARY: Glucose-Capillary: 107 mg/dL — ABNORMAL HIGH (ref 70–99)

## 2013-01-05 SURGERY — COLONOSCOPY
Anesthesia: Moderate Sedation

## 2013-01-05 MED ORDER — FENTANYL CITRATE 0.05 MG/ML IJ SOLN
INTRAMUSCULAR | Status: DC | PRN
  Administered 2013-01-05 (×3): 25 ug via INTRAVENOUS

## 2013-01-05 MED ORDER — MIDAZOLAM HCL 5 MG/ML IJ SOLN
INTRAMUSCULAR | Status: AC
  Filled 2013-01-05: qty 2

## 2013-01-05 MED ORDER — MIDAZOLAM HCL 5 MG/5ML IJ SOLN
INTRAMUSCULAR | Status: DC | PRN
  Administered 2013-01-05: 1 mg via INTRAVENOUS
  Administered 2013-01-05 (×2): 2 mg via INTRAVENOUS
  Administered 2013-01-05: 1 mg via INTRAVENOUS

## 2013-01-05 MED ORDER — INDOMETHACIN 50 MG PO CAPS
50.0000 mg | ORAL_CAPSULE | Freq: Once | ORAL | Status: AC
  Administered 2013-01-05: 50 mg via ORAL
  Filled 2013-01-05: qty 1

## 2013-01-05 MED ORDER — FENTANYL CITRATE 0.05 MG/ML IJ SOLN
INTRAMUSCULAR | Status: AC
  Filled 2013-01-05: qty 2

## 2013-01-05 MED ORDER — PANTOPRAZOLE SODIUM 40 MG PO TBEC: 40.0000 mg | DELAYED_RELEASE_TABLET | Freq: Every day | ORAL | Status: DC

## 2013-01-05 NOTE — Evaluation (Signed)
Physical Therapy Evaluation Patient Details Name: [name] MRN: [id number] DOB: 1945-08-11 Today's Date: 01/05/2013 Time: 1478-2956 PT Time Calculation (min): 16 min  PT Assessment / Plan / Recommendation History of Present Illness  Pt adm with GI bleed.  Clinical Impression  Pt doing well with mobility.  No further PT needed.    PT Assessment  Patent does not need any further PT services    Follow Up Recommendations  No PT follow up    Does the patient have the potential to tolerate intense rehabilitation      Barriers to Discharge        Equipment Recommendations  None recommended by PT    Recommendations for Other Services     Frequency      Precautions / Restrictions Precautions Precautions: None   Pertinent Vitals/Pain Pt on 2L O2      Mobility  Transfers Transfers: Sit to Stand;Stand to Sit Sit to Stand: 6: Modified independent (Device/Increase time);With upper extremity assist;From bed Stand to Sit: 6: Modified independent (Device/Increase time);With upper extremity assist;With armrests;To bed;To chair/3-in-1 Ambulation/Gait Ambulation/Gait Assistance: 6: Modified independent (Device/Increase time) Ambulation Distance (Feet): 275 Feet Assistive device: Rolling walker Ambulation/Gait Assistance Details: 2 standing rest breaks with arms propped on walker. Gait Pattern: Step-through pattern;Decreased stride length Gait velocity: decr    Exercises     PT Diagnosis:    PT Problem List:   PT Treatment Interventions:       PT Goals(Current goals can be found in the care plan section) Acute Rehab PT Goals PT Goal Formulation: No goals set, d/c therapy  Visit Information  Last PT Received On: 01/05/13 History of Present Illness: Pt adm with GI bleed.       Prior Functioning  Home Living Family/patient expects to be discharged to:: Private residence Living Arrangements: Alone Type of Home: Apartment Home Access: Stairs to enter [employer] of Steps: 16 Entrance Stairs-Rails: Right Home Layout: One level Home Equipment: Walker - 4 wheels Additional Comments: friends will help her get trash out, assists getting groceries into apt. Prior Function Level of Independence: Independent with assistive device(s) Communication Communication: No difficulties    Cognition  Cognition Arousal/Alertness: Awake/alert Behavior During Therapy: WFL for tasks assessed/performed Overall Cognitive Status: Within Functional Limits for tasks assessed    Extremity/Trunk Assessment Upper Extremity Assessment Upper Extremity Assessment: Overall WFL for tasks assessed Lower Extremity Assessment Lower Extremity Assessment: Overall WFL for tasks assessed   Balance Balance Balance Assessed: Yes Static Standing Balance Static Standing - Balance Support: No upper extremity supported Static Standing - Level of Assistance: 6: Modified independent (Device/Increase time)  End of Session PT - End of Session Activity Tolerance: Patient tolerated treatment well Patient left: in chair (going to endo) Nurse Communication: Mobility status  GP     Klaudia Beirne 01/05/2013, 2:39 PM  [facility] PT [phone]

## 2013-01-05 NOTE — Discharge Summary (Signed)
PATIENT DETAILS Name: [name] Age: 67 y.o. Sex: female Date of Birth: 08/01/1945 MRN: [id number]. Admit Date: 01/01/2013 Admitting Physician: Lynden Oxford, MD [id number], [name], DO  Recommendations for Outpatient Follow-up:  CBC check in 7 - 10 days.  Follow up for COPD and Gout   PRIMARY DISCHARGE DIAGNOSIS:  Principal Problem:   GI bleed Active Problems:   COPD (chronic obstructive pulmonary disease)   Mixed hyperlipidemia   Type II or unspecified type diabetes mellitus with unspecified complication, uncontrolled   Essential hypertension, benign   Anemia   Acute posthemorrhagic anemia   Gastric AVM   AVM (arteriovenous malformation) of duodenum, acquired   Cecal angiodysplasia   Gout       PAST MEDICAL HISTORY: Past Medical History  Diagnosis Date  . Asthma   . COPD (chronic obstructive pulmonary disease)   . Hypertension   . Diabetes mellitus 09/13/2010    glucophage  . Gout   . Oxygen dependent     pt uses O2   . Pericardial effusion 08/2011    small residual on echo  . Hyperlipidemia   . Type II or unspecified type diabetes mellitus with neurological manifestations, uncontrolled(250.62)   . Personal history of noncompliance with medical treatment, presenting hazards to health   . Unspecified disease of pericardium   . Hemorrhage of rectum and anus   . Panic disorder without agoraphobia   . Depressive disorder, not elsewhere classified   . Edema   . Anxiety state, unspecified   . Unspecified hereditary and idiopathic peripheral neuropathy   . Type II or unspecified type diabetes mellitus without mention of complication, not stated as uncontrolled   . Other and unspecified hyperlipidemia   . Obesity, unspecified   . Unspecified tinnitus   . Unspecified essential hypertension   . Pneumonia, organism unspecified   . Chronic obstructive asthma, unspecified   . Acute and chronic respiratory failure   . Urinary frequency   . Urgency of urination   .  Anemia due to chronic blood loss 01/04/2013    DISCHARGE MEDICATIONS:   Medication List    STOP taking these medications       aspirin EC 81 MG tablet     indomethacin 25 MG capsule  Commonly known as:  INDOCIN     OXYGEN-HELIUM IN      TAKE these medications       albuterol (2.5 MG/3ML) 0.083% nebulizer solution  Commonly known as:  PROVENTIL  Take 2.5 mg by nebulization every 6 (six) hours as needed for shortness of breath.     albuterol 108 (90 BASE) MCG/ACT inhaler  Commonly known as:  PROAIR HFA  Inhale 2 puffs into the lungs every 4 (four) hours as needed for shortness of breath.     budesonide-formoterol 80-4.5 MCG/ACT inhaler  Commonly known as:  SYMBICORT  Inhale 2 puffs into the lungs 2 (two) times daily.     colchicine 0.6 MG tablet  Take 1 tablet (0.6 mg total) by mouth daily.     hydrALAZINE 25 MG tablet  Commonly known as:  APRESOLINE  Take 1 tablet (25 mg total) by mouth every 8 (eight) hours.     hydrochlorothiazide 25 MG tablet  Commonly known as:  HYDRODIURIL  Take 1 tablet (25 mg total) by mouth daily.     Iron 325 (65 FE) MG Tabs  Take 1 tablet by mouth daily.     losartan 100 MG tablet  Commonly known as:  COZAAR  Take 1  tablet (100 mg total) by mouth daily.     lovastatin 40 MG tablet  Commonly known as:  MEVACOR  Take 40 mg by mouth at bedtime.     metFORMIN 500 MG tablet  Commonly known as:  GLUCOPHAGE  Take 1 tablet (500 mg total) by mouth 2 (two) times daily with a meal.     pantoprazole 40 MG tablet  Commonly known as:  PROTONIX  Take 1 tablet (40 mg total) by mouth daily at 12 noon.     sertraline 25 MG tablet  Commonly known as:  ZOLOFT  Take 50 mg by mouth at bedtime.     tiotropium 18 MCG inhalation capsule  Commonly known as:  SPIRIVA  Place 18 mcg into inhaler and inhale daily.        ALLERGIES:   Allergies  Allergen Reactions  . Lisinopril Swelling    Angioedema   . Advair Diskus (Fluticasone-Salmeterol)  Other (See Comments)    Nose bleeds.   . Penicillins     hives    BRIEF HPI:  See H&P, Labs, Consult and Test reports for all details in brief, patient is a 67 year old [place] American female with a history of COPD on home O2, prior history of GI bleeding from known cecal arteriovenous malformation , who was referred to the emergency room by her PCP for severe anemia. Retrospectively, patient gave a history of having black tarry stools a few days prior to admission. She was then admitted for further evaluation and treatment.  CONSULTATIONS:   GI  PERTINENT RADIOLOGIC STUDIES: No results found.   PERTINENT LAB RESULTS: CBC:  Recent Labs  01/04/13 0014 01/05/13 0440  WBC  --  8.4  HGB 7.8* 9.2*  HCT 25.1* 29.1*  PLT  --  297   CMET CMP     Component Value Date/Time   NA 141 01/02/2013 0550   NA 142 11/16/2012 1654   K 3.5 01/02/2013 0550   CL 102 01/02/2013 0550   CO2 29 01/02/2013 0550   GLUCOSE 104* 01/02/2013 0550   GLUCOSE 73 11/16/2012 1654   BUN 12 01/02/2013 0550   BUN 19 11/16/2012 1654   CREATININE 0.98 01/02/2013 0550   CALCIUM 9.4 01/02/2013 0550   PROT 6.8 11/09/2012 0425   ALBUMIN 3.1* 11/09/2012 0425   AST 18 11/09/2012 0425   ALT 18 11/09/2012 0425   ALKPHOS 66 11/09/2012 0425   BILITOT 0.2* 11/09/2012 0425   GFRNONAA 58* 01/02/2013 0550   GFRAA 68* 01/02/2013 0550      BRIEF HOSPITAL COURSE:   Principal Problem:   GI bleed - Secondary to suspected recent bleeding from gastric and duodenal AVMs that was seen on endoscopy on 7/20 and fulgurated. She was admitted and started on a PPI, GI was consulted, endoscopy was subsequently done on 7/20. Given her history of cecal AVMs, a Colonoscopy was done on 7/22.  Colonoscopy showed 6 total AVMs seen and ablated, 5 in the cecum and 1 in the ascending colon, moderate diverticulosis also noted in the sigmoid colon. - Patient did not have any significant bleeding episodes during this hospital stay, and required 3 units of PRBC  transfusion. She will be discharged on a PPI, we will discontinue her aspirin-she does not have a history of coronary artery disease or CVAs. She has been instructed, that in the future if she ever were to have bloody stools or melanotic stools, she seek immediate medical attention. She has claimed understanding. - Prior to discharge patient  received iron dextran complex 25 mg in sodium chloride 0.9% 50 mL IVPB followed by 1,000 mg in sodium chloride 0.9% 500 mL IVPB.  Continue oral iron supplementation following discharge.  Active Problems: Acute blood loss anemia - Patient was admitted with hemoglobin of 6.5, as noted above there was no major acute bleeding during her inpatient stay. In fact her last major bleeding episodes was approximately 3-4 days prior to this admission. She was transfused 3 units of PRBC with appropriate rise in hemoglobin. Hemoglobin on discharge was 9.2. - Prior to discharge patient received iron dextran complex 25 mg in sodium chloride 0.9% 50 mL IVPB followed by 1,000 mg in sodium chloride 0.9% 500 mL IVPB.  Continue oral iron supplementation following discharge.  Gout - Right MTP joint gout - pain started 7/22 - Indomethacin 50 mg given 7/22 with some relief of symptoms.  Due to patient's increased risk of bleeding, discontinue indomethacin use and begin colchicine.  Change discussed with patient and she states understanding. - Colchicine 1.2 mg once on 7/23 followed by 0.6 mg 1 hour later.  Continue Colchicine 0.6 mg daily for prophylactic therapy.  Prescription given - #20, no refill.  Follow-up with PCP.  COPD (chronic obstructive pulmonary disease)  -Lungs clear on exam  -Stable  -c/w Spiriva and prn Albuterol  - Continue with home O2 on discharge  DM  -hold Metformin while inpatient, resume on discharge  -she was maintained on SSI during the hospital stay -CBG's stable   Mixed hyperlipidemia  -resume Statins on discharge   Essential hypertension, benign   -BP stable  -c/w HCTZ, Losartan,and Hydralazine   TODAY-DAY OF DISCHARGE:  Subjective:   Emberlie Hauth today has no headache,no chest abdominal pain,no new weakness tingling or numbness, feels much better wants to go home today.  Objective:   Blood pressure 121/71, pulse 96, temperature 98.3 F (36.8 C), temperature source Oral, resp. rate 22, height 5' 2.5" (1.588 m), weight 87.952 kg (193 lb 14.4 oz), SpO2 94.00%.  Intake/Output Summary (Last 24 hours) at 01/06/13 1229 Last data filed at 01/05/13 1500  Gross per 24 hour  Intake    175 ml  Output      0 ml  Net    175 ml   Filed Weights   01/01/13 2048  Weight: 87.952 kg (193 lb 14.4 oz)    Exam General:  Awake Alert, Oriented *3, No new F.N deficits, Normal affect Head:  [place].AT,PERRAL Neck:  Supple Neck,No JVD, No cervical lymphadenopathy appreciated.  Chest:  Symmetrical Chest wall movement, Good air movement bilaterally, CTAB CV: RRR,No Gallops,Rubs or new Murmurs, No Parasternal Heave Abdomen:  positive B.Sounds, Abd Soft, Non tender, No organomegaly appreciated, No rebound -guarding or rigidity. Extremities:  No Cyanosis, Clubbing or edema, No new Rash or bruise.  Right MTP joint swelling and tenderness to palpation.  No erythema noted.  DISCHARGE CONDITION: Stable  DISPOSITION: Home  DISCHARGE INSTRUCTIONS:    Activity:  As tolerated with Full fall precautions use walker/cane & assistance as needed  Diet recommendation: Diabetic Diet Heart Healthy diet  Discharge Orders   Future Appointments Provider Department Dept Phone   04/01/2013 10:00 AM Kermit Balo, DO [facility] [phone]   Future Orders Complete By Expires     Call MD for:  persistant dizziness or light-headedness  As directed     Call MD for:  As directed     Scheduling Instructions:      Please seek immediate medical attention if  her stools are black in color, or bloody.    Diet - low sodium heart healthy  As directed      Diet - low sodium heart healthy  As directed     Diet Carb Modified  As directed     Increase activity slowly  As directed     Increase activity slowly  As directed        Follow-up Information   Follow up with REED, [name], DO. Schedule an appointment as soon as possible for a visit in 1 week.   Contact information:   [address] [place] [address] [phone]      Total Time spent on discharge equals 45 minutes.  Signed: Michae Kava, PA-C [place], [place] 01/06/2013 12:29 PM

## 2013-01-05 NOTE — Op Note (Addendum)
[facility] [address] [place], [address]   COLONOSCOPY PROCEDURE REPORT  PATIENT: [name], [name]  MR#: [id number] BIRTHDATE: 22-Jan-1946 , 67  yrs. 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  A digital rectal exam revealed no abnormalities of the rectum.   The Pentax Adult Colon [id number] endoscope was introduced through the anus and advanced to the cecum, which was identified by both the appendix and ileocecal valve. No adverse events experienced.   The quality of the prep was excellent, using MoviPrep  The instrument was then slowly withdrawn as the colon was fully examined.      COLON FINDINGS: AVM's were noted at the cecum (5) and in the ascending colon (1). Largest 4 mm. No active bleeding but some stigmata - halo effect. These were ablated with APC (40 and 1L settibngs)   Moderate diverticulosis was noted in the sigmoid colon.   The colon and terminal ileal mucosa were otherwise normal. Retroflexed views revealed no abnormalities. The time to cecum=minutes 0 seconds.  Withdrawal time=18 minutes 0 seconds. The scope was withdrawn and the procedure completed. COMPLICATIONS: There were no complications.  ENDOSCOPIC IMPRESSION: 1.   6 total AVM's wre seen and ablated in the cecum (5) and in the ascending colon(5) 2.   Moderate diverticulosis was noted in the sigmoid colon 3.   The colon mucosa and terminal ileum were otherwise normal - excellent prep  RECOMMENDATIONS: 1) Chronic iron Tx and serial Hgb through PCP, must have at least quarterly CBC.  GI  follow-up can be prn at this point 2) Will need CBC next week 3) Consider IV iron before dc 4) does not need PPI (I dced it) 5) If persistent problems keeping Hgb normal then consider push enteroscopy to look for other treatable AVM's   eSigned:  Iva Boop, MD, [facility] Graham 01/05/2013 3:14 PM cc: Erick Blinks MD and Bufford Spikes, DO

## 2013-01-05 NOTE — Progress Notes (Signed)
PATIENT DETAILS Name: [name] Age: 67 y.o. Sex: female Date of Birth: 10-24-1945 Admit Date: 01/01/2013 Admitting Physician Lynden Oxford, MD WUJ:WJXB, [name], DO  Subjective: Patient feels better.  She has completed overnight preparation for colonoscopy scheduled for today, 7/22.  Patient reports not feeling as tired.  Lives at home alone.  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  - c/w PPI - Hx of Cecal AVM - Colonoscopy with ablation of AVMs on 7/22- and likely subsequent discharge.  Active Problems:  Acute Blood Loss anemia  -Transfused 2 units PRBC on admission, with appropriate rise in Hb  -Transfuse 1 units on 7/21 as patient was very fatigued, and had increased work of breathing. She looks better today and has less problems compared to yesterday. -Baseline hgb appears to be 10.0, Hgb on 7/21 is 7.8, Hgb on 7/22 is 9.2   COPD (chronic obstructive pulmonary disease)  -Lungs clear on exam  -Stable  -c/w Spiriva and prn Albuterol   DM  -hold Metformin while inpatient, resume on discharge  -c/w SSI  -CBG's stable   Mixed hyperlipidemia  -resume Statins on discharge   Essential hypertension, benign  -BP stable  -c/w HCTZ, Losartan,and Hydralazine  Disposition: Remain inpatient PT Consultation requested, unable to be completed 7/21, Attempt PT evaluation 7/22 Likely D/C late 7/22 or 7/23  DVT Prophylaxis: SCD's  Code Status: Full code  Family Communication None at bedside  Procedures:  None  CONSULTS:  GI   MEDICATIONS: Scheduled Meds: . budesonide-formoterol  2 puff Inhalation BID  . chlorhexidine  15 mL Mouth Rinse BID  . hydrALAZINE  25 mg Oral Q8H  . hydrochlorothiazide  25 mg Oral Daily  . insulin aspart  0-9 Units Subcutaneous TID WC  . losartan  100 mg Oral Daily  . pantoprazole  40 mg Oral Q1200  . sertraline  50 mg Oral QHS  . sodium  chloride  3 mL Intravenous Q12H  . tiotropium  18 mcg Inhalation Daily   Continuous Infusions: . sodium chloride 20 mL/hr at 01/05/13 0637   PRN Meds:.acetaminophen, acetaminophen, albuterol, albuterol  Antibiotics: Anti-infectives   None       PHYSICAL EXAM: Vital signs in last 24 hours: Filed Vitals:   01/04/13 1200 01/04/13 1334 01/04/13 2116 01/05/13 0648  BP: 129/83 148/91 146/94 147/88  Pulse:  97 101 99  Temp:  99 F (37.2 C) 99.3 F (37.4 C) 98.6 F (37 C)  TempSrc:  Oral Oral Oral  Resp:  20 20 18   Height:      Weight:      SpO2:   96% 94%    Weight change:  Filed Weights   01/01/13 2048  Weight: 87.952 kg (193 lb 14.4 oz)   Body mass index is 33.8 kg/(m^2).   Gen Exam: Awake and alert with clear speech.  Pleasant Neck: Supple, No JVD.   Chest: B/L Clear.  Wearing O2, with some increased work of breathing on exertion. CVS: S1 S2 Regular, no murmurs.  Abdomen: soft, BS +, non tender, non distended.  Extremities: no edema, lower extremities warm to touch. Neurologic: Non Focal.   Skin: No Rash.   Wounds: N/A.    Intake/Output from previous day:  Intake/Output Summary (Last 24 hours) at 01/05/13 0856 Last data filed at 01/04/13 1055  Gross per 24 hour  Intake  502.5 ml  Output      0 ml  Net  502.5 ml     LAB RESULTS: CBC  Recent Labs Lab 12/31/12 1152 01/01/13 1723 01/02/13 0550 01/03/13 0720 01/04/13 0014 01/05/13 0440  WBC 6.4 5.5 5.4 6.0  --  8.4  HGB 6.5* 6.6* 8.0* 8.3* 7.8* 9.2*  HCT 21.7* 21.7* 25.8* 27.0* 25.1* 29.1*  PLT  --  346 342 342  --  297  MCV 89 88.9 87.2 89.1  --  87.9  MCH 26.6 27.0 27.0 27.4  --  27.8  MCHC 30.0* 30.4 31.0 30.7  --  31.6  RDW 17.1* 17.0* 18.0* 17.9*  --  17.3*  LYMPHSABS 1.2 1.1  --   --   --   --   MONOABS  --  0.5  --   --   --   --   EOSABS 0.1 0.1  --   --   --   --   BASOSABS 0.0 0.0  --   --   --   --     Chemistries   Recent Labs Lab 01/01/13 1723 01/02/13 0550  NA 140 141  K  3.9 3.5  CL 100 102  CO2 33* 29  GLUCOSE 113* 104*  BUN 14 12  CREATININE 0.99 0.98  CALCIUM 9.4 9.4    CBG:  Recent Labs Lab 01/04/13 0734 01/04/13 1127 01/04/13 1627 01/04/13 2109 01/05/13 0816  GLUCAP 119* 150* 118* 97 107*    GFR Estimated Creatinine Clearance: 59.3 ml/min (by C-G formula based on Cr of 0.98).  Coagulation profile  Recent Labs Lab 01/02/13 0550  INR 0.99    RADIOLOGY STUDIES/RESULTS: No results found.  Michae Kava, PA-S  [facility] Pager:[phone]  If 7PM-7AM, please contact night-coverage [email or web address] Password [email or web address] 01/05/2013, 8:56 AM   LOS: 4 days    Attending - Patient seen and examined, the above assessment and plan. She seems less tired and fatigue following transfusion yesterday. Current plans are for colonoscopy today, and possible discharge planning on colonoscopy results.  S Ghimire

## 2013-01-06 ENCOUNTER — Encounter (HOSPITAL_COMMUNITY): Payer: Self-pay | Admitting: Internal Medicine

## 2013-01-06 DIAGNOSIS — M109 Gout, unspecified: Secondary | ICD-10-CM | POA: Diagnosis not present

## 2013-01-06 LAB — GLUCOSE, CAPILLARY
Glucose-Capillary: 112 mg/dL — ABNORMAL HIGH (ref 70–99)
Glucose-Capillary: 104 mg/dL — ABNORMAL HIGH (ref 70–99)
Glucose-Capillary: 94 mg/dL (ref 70–99)
Glucose-Capillary: 153 mg/dL — ABNORMAL HIGH (ref 70–99)

## 2013-01-06 MED ORDER — COLCHICINE 0.6 MG PO TABS: 0.6000 mg | ORAL_TABLET | Freq: Every day | ORAL | Status: DC

## 2013-01-06 MED ORDER — SODIUM CHLORIDE 0.9 % IV SOLN
25.0000 mg | Freq: Once | INTRAVENOUS | Status: AC
  Administered 2013-01-06: 25 mg via INTRAVENOUS
  Filled 2013-01-06: qty 0.5

## 2013-01-06 MED ORDER — COLCHICINE 0.6 MG PO TABS
1.2000 mg | ORAL_TABLET | Freq: Once | ORAL | Status: AC
Start: 2013-01-06 — End: 2013-01-06
  Administered 2013-01-06: 1.2 mg via ORAL
  Filled 2013-01-06: qty 2

## 2013-01-06 MED ORDER — SODIUM CHLORIDE 0.9 % IV SOLN
1000.0000 mg | Freq: Once | INTRAVENOUS | Status: AC
  Administered 2013-01-06: 1000 mg via INTRAVENOUS
  Filled 2013-01-06 (×2): qty 20

## 2013-01-06 MED ORDER — COLCHICINE 0.6 MG PO TABS
0.6000 mg | ORAL_TABLET | Freq: Every day | ORAL | Status: DC
  Administered 2013-01-06: 0.6 mg via ORAL
  Filled 2013-01-06: qty 1

## 2013-01-06 NOTE — Progress Notes (Signed)
MEDICATION RELATED CONSULT NOTE - INITIAL   Pharmacy Consult for IV Iron Dextran Indication: Iron Deficiency Anemia  Allergies  Allergen Reactions  . Lisinopril Swelling    Angioedema   . Advair Diskus (Fluticasone-Salmeterol) Other (See Comments)    Nose bleeds.   . Penicillins     hives    Patient Measurements: Height: 5' 2.5" (158.8 cm) Weight: 193 lb 14.4 oz (87.952 kg) IBW/kg (Calculated) : 51.25   Vital Signs: Temp: 98.3 F (36.8 C) (07/23 0434) Temp src: Oral (07/23 0434) BP: 121/71 mmHg (07/23 0434) Pulse Rate: 96 (07/23 0434) Intake/Output from previous day: 07/22 0701 - 07/23 0700 In: 175 [I.V.:175] Out: -  Intake/Output from this shift:    Labs:  Recent Labs  01/04/13 0014 01/05/13 0440  WBC  --  8.4  HGB 7.8* 9.2*  HCT 25.1* 29.1*  PLT  --  297   Estimated Creatinine Clearance: 58 ml/min (by C-G formula based on Cr of 0.98).   Microbiology: No results found for this or any previous visit (from the past 720 hour(s)).  Medical History: Past Medical History  Diagnosis Date  . Asthma   . COPD (chronic obstructive pulmonary disease)   . Hypertension   . Diabetes mellitus 09/13/2010    glucophage  . Gout   . Oxygen dependent     pt uses O2   . Pericardial effusion 08/2011    small residual on echo  . Hyperlipidemia   . Type II or unspecified type diabetes mellitus with neurological manifestations, uncontrolled(250.62)   . Personal history of noncompliance with medical treatment, presenting hazards to health   . Unspecified disease of pericardium   . Hemorrhage of rectum and anus   . Panic disorder without agoraphobia   . Depressive disorder, not elsewhere classified   . Edema   . Anxiety state, unspecified   . Unspecified hereditary and idiopathic peripheral neuropathy   . Type II or unspecified type diabetes mellitus without mention of complication, not stated as uncontrolled   . Other and unspecified hyperlipidemia   . Obesity,  unspecified   . Unspecified tinnitus   . Unspecified essential hypertension   . Pneumonia, organism unspecified   . Chronic obstructive asthma, unspecified   . Acute and chronic respiratory failure   . Urinary frequency   . Urgency of urination   . Anemia due to chronic blood loss 01/04/2013    Medications:  Scheduled:  . budesonide-formoterol  2 puff Inhalation BID  . chlorhexidine  15 mL Mouth Rinse BID  . hydrALAZINE  25 mg Oral Q8H  . hydrochlorothiazide  25 mg Oral Daily  . insulin aspart  0-9 Units Subcutaneous TID WC  . iron dextran (INFED/DEXFERRUM) infusion  25 mg Intravenous Once   Followed by  . iron dextran (INFED/DEXFERRUM) infusion  1,000 mg Intravenous Once  . losartan  100 mg Oral Daily  . sertraline  50 mg Oral QHS  . sodium chloride  3 mL Intravenous Q12H  . tiotropium  18 mcg Inhalation Daily    Assessment: 67 yo F, admitted to ED per PCP for further work up of anemia.  Patient complained of dark maroon stool 3 weeks ago which lasted 3 days, tiredness, worsening SOB.  Endoscopy performed and found gastric and duodenal AVM (possible source of bleed).  GI recommends IV iron replacement.  Goal of Therapy:  Normal HGB (ideal 14.8)  Plan:  1. Initiate IV iron dextran test dose (25 mg X1), followed by IV iron dextran 1g  X1 2. Recommend follow up outpatient CBC and iron studies  Anabel Bene, PharmD Clinical Pharmacist Resident Pager: [phone]  Anabel Bene 01/06/2013,10:13 AM

## 2013-01-06 NOTE — Discharge Summary (Signed)
Addendum  Patient seen and examined, chart and data base reviewed.  I agree with the above assessment and plan.  For full details please see Mrs. Algis Downs PA note.  Acute GI bleed secondary to duodenal AVMs, acute blood loss anemia corrected with transfusion.  Followup with gastroenterology as outpatient.   Clint Lipps, MD [facility] Pager: [phone] 01/06/2013, 2:05 PM

## 2013-01-06 NOTE — Progress Notes (Signed)
Referred to CSW for medication assistance. Innappropriate referral, notified RNCM Deborah. CSW signing off.  Roddie Mc, [place], [place], [phone]

## 2013-01-06 NOTE — Care Management Note (Signed)
    Page 1 of 1   01/06/2013     11:52:03 AM   CARE MANAGEMENT NOTE 01/06/2013  Patient:  [name], [name]   Account Number:  [id number]  Date Initiated:  01/04/2013  Documentation initiated by:  [facility]  Subjective/Objective Assessment:   GI bleed, anemia  admit- lives alone. Has home oxygen , which is with her in the room.     Action/Plan:   pt eval- no pt f/u needed.   Anticipated DC Date:  01/06/2013   Anticipated DC Plan:  HOME/SELF CARE      DC Planning Services  CM consult      Choice offered to / List presented to:             Status of service:  Completed, signed off Medicare Important Message given?   (If response is "NO", the following Medicare IM given date fields will be blank) Date Medicare IM given:   Date Additional Medicare IM given:    Discharge Disposition:  HOME/SELF CARE  Per UR Regulation:  Reviewed for med. necessity/level of care/duration of stay  If discussed at Long Length of Stay Meetings, dates discussed:    Comments:  01/06/13 11:46 Letha Cape RN, BSN [phone] patient lives alone, she uses a rolling walker, patient drives her self where she needs to go. Per physical therapy she has no pt f/u needs.  Patient has home oxygen, she has her portable tank with her.  Patient has medication coverage, she states her pulmonologist is looking at trying to change her inhalers from spiriva to something else but Dr. Marchelle Gearing gives her samples and she has one at home. Patient has also applied for patient asst for  spriva inhaler because it is so expensive.

## 2013-01-07 NOTE — Progress Notes (Signed)
Pt. discharged to home. Pt after visit summary reviewed and pt capable of re verbalizing medications and follow up appointments. Pt remains stable. No signs and symptoms of distress. Educated to return to ER in the event of SOB, dizziness, chest pain, or fainting. Patient will call MD in the am for prescriptions.  Reata Petrov E

## 2013-01-14 ENCOUNTER — Other Ambulatory Visit: Payer: Self-pay | Admitting: Geriatric Medicine

## 2013-01-14 MED ORDER — COLCHICINE 0.6 MG PO TABS: 0.6000 mg | ORAL_TABLET | Freq: Every day | ORAL | Status: DC

## 2013-01-15 ENCOUNTER — Encounter: Payer: Self-pay | Admitting: Internal Medicine

## 2013-01-15 NOTE — Progress Notes (Signed)
Patient ID: [name], female   DOB: [birth year], 67 y.o.   MRN: [id number] Location:  [facility]  Code Status: full code  Allergies  Allergen Reactions  . Lisinopril Swelling    Angioedema   . Advair Diskus (Fluticasone-Salmeterol) Other (See Comments)    Nose bleeds.   . Penicillins     hives    Chief Complaint  Patient presents with  . Medical Managment of Chronic Issues    discuss breathing inhalers    HPI: Patient is a 67 y.o. black female seen in the office today for f/u on her chronic bronchitis.  She does not have all of her inhalers.  She is getting symbicort through Dr. Marchelle Gearing, but has no spiriva, and does not think she is to be on both.  Review of his notes indicates she is.  Her breathing is currently pretty well controlled, except her panic attacks she has occasionally when out in public and realizes she has gone too far and becomes short of breath.  Has to ask others to help her.    Review of Systems:  Review of Systems  Constitutional: Negative for fever and chills.  HENT: Negative for congestion.   Eyes: Negative for blurred vision.  Respiratory: Positive for cough, sputum production, shortness of breath and wheezing.   Cardiovascular: Negative for chest pain.  Gastrointestinal: Positive for constipation. Negative for heartburn, blood in stool and melena.  Genitourinary: Negative for dysuria.  Musculoskeletal: Negative for falls.  Skin: Negative for rash.  Neurological: Negative for dizziness and headaches.  Endo/Heme/Allergies: Does not bruise/bleed easily.  Psychiatric/Behavioral: Negative for depression and memory loss. The patient is nervous/anxious.      Past Medical History  Diagnosis Date  . Asthma   . COPD (chronic obstructive pulmonary disease)   . Hypertension   . Diabetes mellitus 09/13/2010    glucophage  . Gout   . Oxygen dependent     pt uses O2   . Pericardial effusion 08/2011    small  residual on echo  . Hyperlipidemia   . Type II or unspecified type diabetes mellitus with neurological manifestations, uncontrolled(250.62)   . Personal history of noncompliance with medical treatment, presenting hazards to health   . Unspecified disease of pericardium   . Hemorrhage of rectum and anus   . Panic disorder without agoraphobia   . Depressive disorder, not elsewhere classified   . Edema   . Anxiety state, unspecified   . Unspecified hereditary and idiopathic peripheral neuropathy   . Type II or unspecified type diabetes mellitus without mention of complication, not stated as uncontrolled   . Other and unspecified hyperlipidemia   . Obesity, unspecified   . Unspecified tinnitus   . Unspecified essential hypertension   . Pneumonia, organism unspecified   . Chronic obstructive asthma, unspecified   . Acute and chronic respiratory failure   . Urinary frequency   . Urgency of urination   . Anemia due to chronic blood loss 01/04/2013    Past Surgical History  Procedure Laterality Date  . Breast biopsy      both breasts  . Total abdominal hysterectomy    . Esophagogastroduodenoscopy N/A 01/03/2013    Procedure: ESOPHAGOGASTRODUODENOSCOPY (EGD);  Surgeon: Louis Meckel, MD;  [facility] ENDOSCOPY;  Service: Endoscopy;  Laterality: N/A;  . Colonoscopy N/A 01/05/2013    Procedure: COLONOSCOPY;  Surgeon: Iva Boop, MD;  Location: [facility] ENDOSCOPY;  Service: Endoscopy;  Laterality: N/A;  Social History:   reports that she quit smoking about 8 years ago. Her smoking use included Cigarettes. She has a 30 pack-year smoking history. She has never used smokeless tobacco. She reports that she drinks about 1.0 ounces of alcohol per week. She reports that she does not use illicit drugs.  Family History  Problem Relation Age of Onset  . COPD Mother   . COPD Sister   . Hypertension Sister   . Cancer Father   . Asthma Father   . Diabetes Other     3 maternal aunts  . Prostate  cancer Paternal Uncle     Medications: Patient's Medications  New Prescriptions   ALBUTEROL (PROAIR HFA) 108 (90 BASE) MCG/ACT INHALER    Inhale 2 puffs into the lungs every 4 (four) hours as needed for shortness of breath.   HYDRALAZINE (APRESOLINE) 25 MG TABLET    Take 1 tablet (25 mg total) by mouth every 8 (eight) hours.   HYDROCHLOROTHIAZIDE (HYDRODIURIL) 25 MG TABLET    Take 1 tablet (25 mg total) by mouth daily.   METFORMIN (GLUCOPHAGE) 500 MG TABLET    Take 1 tablet (500 mg total) by mouth 2 (two) times daily with a meal.   PANTOPRAZOLE (PROTONIX) 40 MG TABLET    Take 1 tablet (40 mg total) by mouth daily at 12 noon.  Previous Medications   ALBUTEROL (PROVENTIL) (2.5 MG/3ML) 0.083% NEBULIZER SOLUTION    Take 2.5 mg by nebulization every 6 (six) hours as needed for shortness of breath.    FERROUS SULFATE (IRON) 325 (65 FE) MG TABS    Take 1 tablet by mouth daily.     LOSARTAN (COZAAR) 100 MG TABLET    Take 1 tablet (100 mg total) by mouth daily.   LOVASTATIN (MEVACOR) 40 MG TABLET    Take 40 mg by mouth at bedtime.    SERTRALINE (ZOLOFT) 25 MG TABLET    Take 50 mg by mouth at bedtime.   TIOTROPIUM (SPIRIVA) 18 MCG INHALATION CAPSULE    Place 18 mcg into inhaler and inhale daily.  Modified Medications   Modified Medication Previous Medication   BUDESONIDE-FORMOTEROL (SYMBICORT) 80-4.5 MCG/ACT INHALER budesonide-formoterol (SYMBICORT) 80-4.5 MCG/ACT inhaler      Inhale 2 puffs into the lungs 2 (two) times daily.    Inhale 2 puffs into the lungs 2 (two) times daily.   COLCHICINE 0.6 MG TABLET colchicine 0.6 MG tablet      Take 1 tablet (0.6 mg total) by mouth daily.    Take 1 tablet (0.6 mg total) by mouth daily.  Discontinued Medications   ALBUTEROL (PROAIR HFA) 108 (90 BASE) MCG/ACT INHALER    Inhale 2 puffs into the lungs every 4 (four) hours as needed for shortness of breath.    ASPIRIN 81 MG TABLET    Take 81 mg by mouth daily.   HYDROCHLOROTHIAZIDE 25 MG TABLET    Take 25 mg by  mouth daily.     INDOMETHACIN (INDOCIN) 25 MG CAPSULE    Take 25 mg by mouth 3 (three) times daily as needed.   INDOMETHACIN (INDOCIN) 25 MG CAPSULE    Take 1 capsule (25 mg total) by mouth 3 (three) times daily as needed.   METFORMIN (GLUCOPHAGE) 500 MG TABLET    Take 500 mg by mouth 2 (two) times daily with a meal.   MULTIPLE VITAMINS-MINERALS (MULTIPLE VITAMINS/WOMENS PO)    Take 1 tablet by mouth daily.   OXYGEN-HELIUM IN    Inhale 2 L/min  into the lungs daily. 24/7   SERTRALINE (ZOLOFT) 25 MG TABLET    Take 25 mg by mouth daily.      Physical Exam: Filed Vitals:   10/26/12 1541  BP: 138/92  Pulse: 75  Temp: 98.1 F (36.7 C)  TempSrc: Oral  Resp: 18  Height: 5\' 2"  (1.575 m)  Weight: 196 lb (88.905 kg)  SpO2: 98%  Physical Exam  Constitutional: She is oriented to person, place, and time.  Obese black female, nad, on portable O2  HENT:  Head: Normocephalic and atraumatic.  Cardiovascular: Normal rate, regular rhythm, normal heart sounds and intact distal pulses.   Pulmonary/Chest: Effort normal and breath sounds normal. She has no wheezes.  Abdominal: Soft. Bowel sounds are normal.  Neurological: She is alert and oriented to person, place, and time.  Skin: Skin is warm and dry.   April labs reviewed.  Assessment/Plan No problem-specific assessment & plan notes found for this encounter. 1. COPD (chronic obstructive pulmonary disease) with chronic bronchitis --given spiriva sample--advised to call back when getting low to see if we have more samples --to f/u with Dr. Marchelle Gearing about symbicort program  --explained importance of never running out of these or her rescue inhaler --also may use nebs at home as needed on bad days  Labs/tests ordered:  As already scheduled pre appointment Next appt:  As scheduled

## 2013-01-28 ENCOUNTER — Encounter: Payer: Self-pay | Admitting: Internal Medicine

## 2013-01-28 ENCOUNTER — Ambulatory Visit (INDEPENDENT_AMBULATORY_CARE_PROVIDER_SITE_OTHER): Payer: Medicare Other | Admitting: Internal Medicine

## 2013-01-28 VITALS — BP 120/70 | HR 108 | Temp 97.8°F | Resp 18 | Ht 62.5 in | Wt 190.6 lb

## 2013-01-28 DIAGNOSIS — D509 Iron deficiency anemia, unspecified: Secondary | ICD-10-CM

## 2013-01-28 DIAGNOSIS — F411 Generalized anxiety disorder: Secondary | ICD-10-CM

## 2013-01-28 DIAGNOSIS — J449 Chronic obstructive pulmonary disease, unspecified: Secondary | ICD-10-CM

## 2013-01-28 DIAGNOSIS — M109 Gout, unspecified: Secondary | ICD-10-CM

## 2013-01-28 DIAGNOSIS — J4489 Other specified chronic obstructive pulmonary disease: Secondary | ICD-10-CM

## 2013-01-28 MED ORDER — BUDESONIDE-FORMOTEROL FUMARATE 80-4.5 MCG/ACT IN AERO: 2.0000 | INHALATION_SPRAY | Freq: Two times a day (BID) | RESPIRATORY_TRACT | Status: DC

## 2013-01-28 MED ORDER — SERTRALINE HCL 25 MG PO TABS: 50.0000 mg | ORAL_TABLET | Freq: Every day | ORAL | Status: DC

## 2013-01-28 NOTE — Progress Notes (Signed)
Patient ID: [name], female   DOB: 18-Feb-1946, 67 y.o.   MRN: [id number] Location:  [facility]  Code Status: DNR   Allergies  Allergen Reactions  . Lisinopril Swelling    Angioedema   . Advair Diskus [Fluticasone-Salmeterol] Other (See Comments)    Nose bleeds.   . Penicillins     hives    Chief Complaint  Patient presents with  . Hospitalization Follow-up    HPI: Patient is a 67 y.o. 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 I had sent her to the hospital due to low hemoglobin (down to hgb 6.5).  She required a transfusion of 2 units of prbcs.  She had heme positive stool on DRE.  She underwent EGD and colonoscopy by Dr. 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  The AVMs were fulgarized.  She's had no bleeding since.  Her discharge hgb was 9.2 on 01/05/13.  She feels better again.    Her breathing remains difficult and she develops severe anxiety when she walks long distances.   Review of Systems:  Review of Systems  Constitutional: Positive for malaise/fatigue. Negative for fever, chills and diaphoresis.  HENT: Negative for congestion.   Eyes: Negative for blurred vision.  Respiratory: Positive for cough, sputum production, shortness of breath and wheezing.   Cardiovascular: Negative for chest pain, orthopnea, leg swelling and PND.  Gastrointestinal: Negative for heartburn, blood in stool and melena.  Genitourinary: Negative for dysuria.  Musculoskeletal: Negative for myalgias and falls.  Skin: Negative for rash.  Neurological: Negative for dizziness, weakness and headaches.  Endo/Heme/Allergies: Does not bruise/bleed easily.  Psychiatric/Behavioral: Positive for depression. Negative for memory loss.     Past Medical History  Diagnosis Date  . Asthma   . COPD (chronic obstructive pulmonary  disease)   . Hypertension   . Diabetes mellitus 09/13/2010    glucophage  . Gout   . Oxygen dependent     pt uses O2   . Pericardial effusion 08/2011    small residual on echo  . Hyperlipidemia   . Type II or unspecified type diabetes mellitus with neurological manifestations, uncontrolled(250.62)   . Personal history of noncompliance with medical treatment, presenting hazards to health   . Unspecified disease of pericardium   . Hemorrhage of rectum and anus   . Panic disorder without agoraphobia   . Depressive disorder, not elsewhere classified   . Edema   . Anxiety state, unspecified   . Unspecified hereditary and idiopathic peripheral neuropathy   . Type II or unspecified type diabetes mellitus without mention of complication, not stated as uncontrolled   . Other and unspecified hyperlipidemia   . Obesity, unspecified   . Unspecified tinnitus   . Unspecified essential hypertension   . Pneumonia, organism unspecified   . Chronic obstructive asthma, unspecified   . Acute and chronic respiratory failure   . Urinary frequency   . Urgency of urination   . Anemia due to chronic blood loss 01/04/2013    Past Surgical History  Procedure Laterality Date  . Breast biopsy      both breasts  . Total abdominal hysterectomy    . Esophagogastroduodenoscopy N/A 01/03/2013    Procedure: ESOPHAGOGASTRODUODENOSCOPY (EGD);  Surgeon: Louis Meckel, MD;  Location: [facility] ENDOSCOPY;  Service: Endoscopy;  Laterality: N/A;  . Colonoscopy N/A 01/05/2013    Procedure: COLONOSCOPY;  Surgeon: Iva Boop, MD;  Location: [facility] ENDOSCOPY;  Service: Endoscopy;  Laterality: N/A;    Social History:   reports that she quit smoking about 8 years ago. Her smoking use included Cigarettes. She has a 30 pack-year smoking history. She has never used smokeless tobacco. She reports that she drinks about 1.0 ounces of alcohol per week. She reports that she does not use illicit drugs.  Family History  Problem Relation  Age of Onset  . COPD Mother   . COPD Sister   . Hypertension Sister   . Cancer Father   . Asthma Father   . Diabetes Other     3 maternal aunts  . Prostate cancer Paternal Uncle     Medications: Patient's Medications  New Prescriptions   No medications on file  Previous Medications   ALBUTEROL (PROAIR HFA) 108 (90 BASE) MCG/ACT INHALER    Inhale 2 puffs into the lungs every 4 (four) hours as needed for shortness of breath.   ALBUTEROL (PROVENTIL) (2.5 MG/3ML) 0.083% NEBULIZER SOLUTION    Take 2.5 mg by nebulization every 6 (six) hours as needed for shortness of breath.    BUDESONIDE-FORMOTEROL (SYMBICORT) 80-4.5 MCG/ACT INHALER    Inhale 2 puffs into the lungs 2 (two) times daily.   COLCHICINE 0.6 MG TABLET    Take 1 tablet (0.6 mg total) by mouth daily.   FERROUS SULFATE (IRON) 325 (65 FE) MG TABS    Take 1 tablet by mouth daily.     HYDRALAZINE (APRESOLINE) 25 MG TABLET    Take 1 tablet (25 mg total) by mouth every 8 (eight) hours.   HYDROCHLOROTHIAZIDE (HYDRODIURIL) 25 MG TABLET    Take 1 tablet (25 mg total) by mouth daily.   LOSARTAN (COZAAR) 100 MG TABLET    Take 1 tablet (100 mg total) by mouth daily.   LOVASTATIN (MEVACOR) 40 MG TABLET    Take 40 mg by mouth at bedtime.    METFORMIN (GLUCOPHAGE) 500 MG TABLET    Take 1 tablet (500 mg total) by mouth 2 (two) times daily with a meal.   PANTOPRAZOLE (PROTONIX) 40 MG TABLET    Take 1 tablet (40 mg total) by mouth daily at 12 noon.   TIOTROPIUM (SPIRIVA) 18 MCG INHALATION CAPSULE    Place 18 mcg into inhaler and inhale daily.  Modified Medications   Modified Medication Previous Medication   SERTRALINE (ZOLOFT) 25 MG TABLET sertraline (ZOLOFT) 25 MG tablet      Take 2 tablets (50 mg total) by mouth at bedtime.    Take 50 mg by mouth at bedtime.  Discontinued Medications   No medications on file     Physical Exam: Filed Vitals:   01/28/13 1407  BP: 120/70  Pulse: 108  Temp: 97.8 F (36.6 C)  TempSrc: Oral  Resp: 18   Height: 5' 2.5" (1.588 m)  Weight: 190 lb 9.6 oz (86.456 kg)  SpO2: 97%  Physical Exam  Constitutional: She is oriented to person, place, and time. No distress.  HENT:  Head: Normocephalic and atraumatic.  Cardiovascular: Normal rate, regular rhythm, normal heart sounds and intact distal pulses.   Pulmonary/Chest: She is in respiratory distress. She has wheezes.  Chronic increased work of breathing, dyspnea on exertion walking into the office, takes several minutes to recover  Abdominal: Soft. Bowel sounds are normal. She exhibits no distension. There is no tenderness.  Musculoskeletal: Normal range of motion.  Walks using rollator walker only to store her oxygen  Neurological: She is alert and oriented to person, place, and time.  Skin: Skin is warm and dry.  Psychiatric: She has a normal mood and affect.     Labs reviewed: Basic Metabolic Panel:  Recent Labs  16/10/96 1654 01/01/13 1723 01/02/13 0550  NA 142 140 141  K 4.0 3.9 3.5  CL 98 100 102  CO2 33* 33* 29  GLUCOSE 73 113* 104*  BUN 19 14 12   CREATININE 1.03* 0.99 0.98  CALCIUM 8.9 9.4 9.4   Liver Function Tests:  Recent Labs  11/09/12 0425  AST 18  ALT 18  ALKPHOS 66  BILITOT 0.2*  PROT 6.8  ALBUMIN 3.1*  CBC:  Recent Labs  09/24/12 1433  12/31/12 1152 01/01/13 1723 01/02/13 0550 01/03/13 0720 01/04/13 0014 01/05/13 0440  WBC 6.0  < > 6.4 5.5 5.4 6.0  --  8.4  NEUTROABS 3.4  --  4.3 3.8  --   --   --   --   HGB 9.4*  < > 6.5* 6.6* 8.0* 8.3* 7.8* 9.2*  HCT 31.8*  < > 21.7* 21.7* 25.8* 27.0* 25.1* 29.1*  MCV 85  < > 89 88.9 87.2 89.1  --  87.9  PLT  --   < >  --  346 342 342  --  297  < > = values in this interval not displayed.  Lab Results  Component Value Date   HGBA1C 5.9* 11/07/2012    Past Procedures: 01/03/13 EGD:  Gastric and duodenal AVMs--underwent fulguration--Dr. Arlyce Dice 01/05/13 cscope:  Cecal angiodysplasia  Assessment/Plan 1. Anxiety state, unspecified - renewed  sertraline (ZOLOFT) 25 MG tablet; Take 2 tablets (50 mg total) by mouth at bedtime.  Dispense: 60 tablet; Refill: 3 - stable with this  2. Iron deficiency anemia -has AVMs present that caused her bleeding -cont iron replacement -repeat hgb today to reassess and make sure she is not having any microscopic bleeding  3. Gout -left great toe flare during hospitalization -was put on colchicine for this -had eaten raw clams, oysters, muscles, crabs just before her visit with me and the hospitalization--knows she overdid it -check uric acid level   4. COPD (chronic obstructive pulmonary disease) -same as usual -is out of symbicort again -given the application to complete for medication assistance with her symbicort  Labs/tests ordered:  Cbc, uric acid Next appt:  Keep regular f/u

## 2013-01-29 LAB — CBC WITH DIFFERENTIAL/PLATELET
Basophils Absolute: 0 10*3/uL (ref 0.0–0.2)
Basos: 0 % (ref 0–3)
Eos: 2 % (ref 0–5)
Eosinophils Absolute: 0.1 10*3/uL (ref 0.0–0.4)
HCT: 34.3 % (ref 34.0–46.6)
Hemoglobin: 10.5 g/dL — ABNORMAL LOW (ref 11.1–15.9)
Immature Grans (Abs): 0 10*3/uL (ref 0.0–0.1)
Immature Granulocytes: 0 % (ref 0–2)
Lymphocytes Absolute: 1.3 10*3/uL (ref 0.7–3.1)
Lymphs: 22 % (ref 14–46)
MCH: 27 pg (ref 26.6–33.0)
MCHC: 30.6 g/dL — ABNORMAL LOW (ref 31.5–35.7)
MCV: 88 fL (ref 79–97)
Monocytes Absolute: 0.4 10*3/uL (ref 0.1–0.9)
Monocytes: 7 % (ref 4–12)
Neutrophils Absolute: 4 10*3/uL (ref 1.4–7.0)
Neutrophils Relative %: 69 % (ref 40–74)
RBC: 3.89 x10E6/uL (ref 3.77–5.28)
RDW: 16.7 % — ABNORMAL HIGH (ref 12.3–15.4)
WBC: 5.8 10*3/uL (ref 3.4–10.8)

## 2013-01-29 LAB — URIC ACID: Uric Acid: 6.9 mg/dL (ref 2.5–7.1)

## 2013-03-16 ENCOUNTER — Ambulatory Visit (INDEPENDENT_AMBULATORY_CARE_PROVIDER_SITE_OTHER): Payer: Medicare Other

## 2013-03-16 DIAGNOSIS — Z23 Encounter for immunization: Secondary | ICD-10-CM

## 2013-04-01 ENCOUNTER — Ambulatory Visit: Payer: Medicare Other | Admitting: Internal Medicine

## 2013-04-15 ENCOUNTER — Ambulatory Visit (INDEPENDENT_AMBULATORY_CARE_PROVIDER_SITE_OTHER): Payer: Medicare Other | Admitting: Internal Medicine

## 2013-04-15 ENCOUNTER — Encounter: Payer: Self-pay | Admitting: Internal Medicine

## 2013-04-15 VITALS — BP 134/80 | HR 87 | Temp 98.4°F | Wt 190.8 lb

## 2013-04-15 DIAGNOSIS — I10 Essential (primary) hypertension: Secondary | ICD-10-CM

## 2013-04-15 DIAGNOSIS — Z0000 Encounter for general adult medical examination without abnormal findings: Secondary | ICD-10-CM

## 2013-04-15 DIAGNOSIS — J4489 Other specified chronic obstructive pulmonary disease: Secondary | ICD-10-CM

## 2013-04-15 DIAGNOSIS — F411 Generalized anxiety disorder: Secondary | ICD-10-CM

## 2013-04-15 DIAGNOSIS — D509 Iron deficiency anemia, unspecified: Secondary | ICD-10-CM

## 2013-04-15 DIAGNOSIS — J449 Chronic obstructive pulmonary disease, unspecified: Secondary | ICD-10-CM

## 2013-04-15 DIAGNOSIS — E782 Mixed hyperlipidemia: Secondary | ICD-10-CM

## 2013-04-15 DIAGNOSIS — E119 Type 2 diabetes mellitus without complications: Secondary | ICD-10-CM

## 2013-04-15 NOTE — Progress Notes (Signed)
Patient ID: [name], female   DOB: 1946-02-05, 67 y.o.   MRN: [id number] Location:  [facility]  Code Status: full code   Allergies  Allergen Reactions  . Lisinopril Swelling    Angioedema   . Advair Diskus [Fluticasone-Salmeterol] Other (See Comments)    Nose bleeds.   . Penicillins     hives    Chief Complaint  Patient presents with  . Medical Managment of Chronic Issues    3 month f/u    HPI: Patient is a 67 y.o. AA female seen in the office today for Medical management of chronic issues.  States that she has not noticed any blood in her stool since then. States that she checks every time she has a BM.  Pt. Has been approved for the "medication assistance program" so she was able to get her Symbicort and has been using that. States that her breathing is better because she was able to go to [employer] homecoming parade and was able to walk. States that the cooler weather has helped her breathing. States that recently she was able to walk into [employer] from the parking lot and did not get winded.   States that her anxiety has resolved.   Review of Systems:  Review of Systems  Constitutional: Negative for fever, chills and weight loss.  Eyes: Negative for blurred vision and double vision.  Respiratory: Positive for shortness of breath.   Cardiovascular: Negative for chest pain.  Gastrointestinal: Negative for nausea, vomiting, diarrhea and constipation.  Genitourinary: Positive for frequency. Negative for dysuria and urgency.       Especially during the night  Musculoskeletal: Negative for falls.  Neurological: Negative for dizziness and headaches.  Psychiatric/Behavioral: Negative for depression and memory loss. The patient is not nervous/anxious.      Past Medical History  Diagnosis Date  . Asthma   . COPD (chronic obstructive pulmonary disease)   . Hypertension   . Diabetes mellitus 09/13/2010    glucophage  . Gout   .  Oxygen dependent     pt uses O2   . Pericardial effusion 08/2011    small residual on echo  . Hyperlipidemia   . Type II or unspecified type diabetes mellitus with neurological manifestations, uncontrolled(250.62)   . Personal history of noncompliance with medical treatment, presenting hazards to health   . Unspecified disease of pericardium   . Hemorrhage of rectum and anus   . Panic disorder without agoraphobia   . Depressive disorder, not elsewhere classified   . Edema   . Anxiety state, unspecified   . Unspecified hereditary and idiopathic peripheral neuropathy   . Type II or unspecified type diabetes mellitus without mention of complication, not stated as uncontrolled   . Other and unspecified hyperlipidemia   . Obesity, unspecified   . Unspecified tinnitus   . Unspecified essential hypertension   . Pneumonia, organism unspecified   . Chronic obstructive asthma, unspecified   . Acute and chronic respiratory failure   . Urinary frequency   . Urgency of urination   . Anemia due to chronic blood loss 01/04/2013    Past Surgical History  Procedure Laterality Date  . Breast biopsy      both breasts  . Total abdominal hysterectomy    . Esophagogastroduodenoscopy N/A 01/03/2013    Procedure: ESOPHAGOGASTRODUODENOSCOPY (EGD);  Surgeon: Louis Meckel, MD;  Location: [facility] ENDOSCOPY;  Service: Endoscopy;  Laterality: N/A;  . Colonoscopy N/A 01/05/2013  Procedure: COLONOSCOPY;  Surgeon: Iva Boop, MD;  Location: [facility] ENDOSCOPY;  Service: Endoscopy;  Laterality: N/A;    Social History:   reports that she quit smoking about 8 years ago. Her smoking use included Cigarettes. She has a 30 pack-year smoking history. She has never used smokeless tobacco. She reports that she drinks about 1.0 ounces of alcohol per week. She reports that she does not use illicit drugs.  Family History  Problem Relation Age of Onset  . COPD Mother   . COPD Sister   . Hypertension Sister   . Cancer  Father   . Asthma Father   . Diabetes Other     3 maternal aunts  . Prostate cancer Paternal Uncle     Medications: Patient's Medications  New Prescriptions   No medications on file  Previous Medications   ALBUTEROL (PROAIR HFA) 108 (90 BASE) MCG/ACT INHALER    Inhale 2 puffs into the lungs every 4 (four) hours as needed for shortness of breath.   ALBUTEROL (PROVENTIL) (2.5 MG/3ML) 0.083% NEBULIZER SOLUTION    Take 2.5 mg by nebulization every 6 (six) hours as needed for shortness of breath.    BUDESONIDE-FORMOTEROL (SYMBICORT) 80-4.5 MCG/ACT INHALER    Inhale 2 puffs into the lungs 2 (two) times daily.   COLCHICINE 0.6 MG TABLET    Take 1 tablet (0.6 mg total) by mouth daily.   FERROUS SULFATE (IRON) 325 (65 FE) MG TABS    Take 1 tablet by mouth daily.     HYDRALAZINE (APRESOLINE) 25 MG TABLET    Take 1 tablet (25 mg total) by mouth every 8 (eight) hours.   HYDROCHLOROTHIAZIDE (HYDRODIURIL) 25 MG TABLET    Take 1 tablet (25 mg total) by mouth daily.   LOSARTAN (COZAAR) 100 MG TABLET    Take 1 tablet (100 mg total) by mouth daily.   LOVASTATIN (MEVACOR) 40 MG TABLET    Take 40 mg by mouth at bedtime.    METFORMIN (GLUCOPHAGE) 500 MG TABLET    Take 1 tablet (500 mg total) by mouth 2 (two) times daily with a meal.   PANTOPRAZOLE (PROTONIX) 40 MG TABLET    Take 1 tablet (40 mg total) by mouth daily at 12 noon.   SERTRALINE (ZOLOFT) 25 MG TABLET    Take 2 tablets (50 mg total) by mouth at bedtime.   TIOTROPIUM (SPIRIVA) 18 MCG INHALATION CAPSULE    Place 18 mcg into inhaler and inhale daily.  Modified Medications   No medications on file  Discontinued Medications   No medications on file     Physical Exam: Filed Vitals:   04/15/13 1002  BP: 134/80  Pulse: 87  Temp: 98.4 F (36.9 C)  TempSrc: Oral  Weight: 190 lb 12.8 oz (86.546 kg)  SpO2: 99%   Physical Exam  Constitutional: She is oriented to person, place, and time. She appears well-developed and well-nourished.   Pulmonary/Chest: Effort normal and breath sounds normal.  Breath sounds diminshed  Musculoskeletal: Normal range of motion.  Neurological: She is alert and oriented to person, place, and time.  Skin: Skin is warm and dry.  Psychiatric: She has a normal mood and affect.   Labs reviewed: Basic Metabolic Panel:  Recent Labs  16/10/96 1654 01/01/13 1723 01/02/13 0550  NA 142 140 141  K 4.0 3.9 3.5  CL 98 100 102  CO2 33* 33* 29  GLUCOSE 73 113* 104*  BUN 19 14 12   CREATININE 1.03* 0.99 0.98  CALCIUM  8.9 9.4 9.4   Liver Function Tests:  Recent Labs  11/09/12 0425  AST 18  ALT 18  ALKPHOS 66  BILITOT 0.2*  PROT 6.8  ALBUMIN 3.1*   CBC:  Recent Labs  12/31/12 1152 01/01/13 1723 01/02/13 0550 01/03/13 0720 01/04/13 0014 01/05/13 0440 01/28/13 1448  WBC 6.4 5.5 5.4 6.0  --  8.4 5.8  NEUTROABS 4.3 3.8  --   --   --   --  4.0  HGB 6.5* 6.6* 8.0* 8.3* 7.8* 9.2* 10.5*  HCT 21.7* 21.7* 25.8* 27.0* 25.1* 29.1* 34.3  MCV 89 88.9 87.2 89.1  --  87.9 88  PLT  --  346 342 342  --  297  --    Lipid Panel:  Lab Results  Component Value Date   HGBA1C 5.9* 11/07/2012   Assessment/Plan 1. Anxiety state, unspecified -much improved recently -has made new friends she plays cards with on Tuesday mornings  2. Essential hypertension, benign At goal, no changes needed  3. Type 2 diabetes mellitus, controlled - CBC with Differential - Comprehensive metabolic panel - Hemoglobin A1c -due for follow up labs as above, readings have been satisfactory  4. Iron deficiency anemia -f/u cbc  -continues on iron  5. COPD (chronic obstructive pulmonary disease) with chronic bronchitis -is advanced, uses oxygen therapy 24/7 -follows with pulmonary -now has drug company support for both spiriva and symbicort and has been using her medications as prescribed  6. Mixed hyperlipidemia F/u lipid panel to reassess  Labs/tests ordered: cbc, cmp, hba1c, flp Next appt:  3  mos

## 2013-04-16 LAB — LIPID PANEL
Chol/HDL Ratio: 3.3 ratio units (ref 0.0–4.4)
Cholesterol, Total: 260 mg/dL — ABNORMAL HIGH (ref 100–199)
HDL: 80 mg/dL (ref >39)
LDL Calculated: 165 mg/dL — ABNORMAL HIGH (ref 0–99)
Triglycerides: 73 mg/dL (ref 0–149)
VLDL Cholesterol Cal: 15 mg/dL (ref 5–40)

## 2013-04-16 LAB — COMPREHENSIVE METABOLIC PANEL
ALT: 13 IU/L (ref 0–32)
AST: 12 IU/L (ref 0–40)
Albumin/Globulin Ratio: 2.2 (ref 1.1–2.5)
Albumin: 4.4 g/dL (ref 3.6–4.8)
Alkaline Phosphatase: 68 IU/L (ref 39–117)
BUN/Creatinine Ratio: 19 (ref 11–26)
BUN: 20 mg/dL (ref 8–27)
CO2: 32 mmol/L — ABNORMAL HIGH (ref 18–29)
Calcium: 9.7 mg/dL (ref 8.6–10.2)
Chloride: 97 mmol/L (ref 97–108)
Creatinine, Ser: 1.08 mg/dL — ABNORMAL HIGH (ref 0.57–1.00)
GFR calc Af Amer: 61 mL/min/{1.73_m2} (ref >59)
GFR calc non Af Amer: 53 mL/min/{1.73_m2} — ABNORMAL LOW (ref >59)
Globulin, Total: 2 g/dL (ref 1.5–4.5)
Glucose: 104 mg/dL — ABNORMAL HIGH (ref 65–99)
Potassium: 3.9 mmol/L (ref 3.5–5.2)
Sodium: 143 mmol/L (ref 134–144)
Total Bilirubin: 0.2 mg/dL (ref 0.0–1.2)
Total Protein: 6.4 g/dL (ref 6.0–8.5)

## 2013-04-16 LAB — CBC WITH DIFFERENTIAL/PLATELET
Basophils Absolute: 0 10*3/uL (ref 0.0–0.2)
Basos: 0 %
Eos: 2 %
Eosinophils Absolute: 0.1 10*3/uL (ref 0.0–0.4)
HCT: 37.4 % (ref 34.0–46.6)
Hemoglobin: 11.8 g/dL (ref 11.1–15.9)
Immature Grans (Abs): 0 10*3/uL (ref 0.0–0.1)
Immature Granulocytes: 0 %
Lymphocytes Absolute: 1.4 10*3/uL (ref 0.7–3.1)
Lymphs: 26 %
MCH: 26.8 pg (ref 26.6–33.0)
MCHC: 31.6 g/dL (ref 31.5–35.7)
MCV: 85 fL (ref 79–97)
Monocytes Absolute: 0.5 10*3/uL (ref 0.1–0.9)
Monocytes: 9 %
Neutrophils Absolute: 3.3 10*3/uL (ref 1.4–7.0)
Neutrophils Relative %: 63 %
RBC: 4.41 x10E6/uL (ref 3.77–5.28)
RDW: 17.1 % — ABNORMAL HIGH (ref 12.3–15.4)
WBC: 5.2 10*3/uL (ref 3.4–10.8)

## 2013-04-16 LAB — HEMOGLOBIN A1C
Est. average glucose Bld gHb Est-mCnc: 137 mg/dL
Hgb A1c MFr Bld: 6.4 % — ABNORMAL HIGH (ref 4.8–5.6)

## 2013-04-20 ENCOUNTER — Telehealth: Payer: Self-pay

## 2013-04-20 MED ORDER — LOVASTATIN 40 MG PO TABS: ORAL_TABLET | ORAL | Status: DC

## 2013-04-20 MED ORDER — METFORMIN HCL 1000 MG PO TABS: 1000.0000 mg | ORAL_TABLET | Freq: Two times a day (BID) | ORAL | Status: DC

## 2013-04-20 NOTE — Telephone Encounter (Signed)
Message copied by Maurice Small on Tue Apr 20, 2013  9:22 AM ------      Message from: [place], [place] L      Created: Fri Apr 16, 2013  1:50 PM       Kidney function and glucose slightly worse.  Increase metformin to 1000mg  po bid.      Cholesterol is above goal.  Increase lovastatin to 80mg  qhs.        Will recheck labs next time.  Remind her to try to watch her fried foods, sweets and drink enough water.       ------

## 2013-04-20 NOTE — Telephone Encounter (Signed)
Left message on voicemail for patient to return call when available   

## 2013-04-20 NOTE — Telephone Encounter (Signed)
Discussed with patient, patient verbalized understanding of results. Rx's sent to pharmacy

## 2013-04-21 ENCOUNTER — Other Ambulatory Visit: Payer: Self-pay | Admitting: *Deleted

## 2013-04-21 MED ORDER — LOVASTATIN 20 MG PO TABS: ORAL_TABLET | ORAL | Status: DC

## 2013-05-26 ENCOUNTER — Ambulatory Visit
Admission: RE | Admit: 2013-05-26 | Discharge: 2013-05-26 | Disposition: A | Discharge status: Home / Self Care | Payer: Medicare Other | Source: Ambulatory Visit | Attending: Internal Medicine | Admitting: Internal Medicine

## 2013-05-26 ENCOUNTER — Other Ambulatory Visit: Payer: Self-pay | Admitting: Internal Medicine

## 2013-05-26 DIAGNOSIS — Z0000 Encounter for general adult medical examination without abnormal findings: Secondary | ICD-10-CM

## 2013-05-26 DIAGNOSIS — Z1231 Encounter for screening mammogram for malignant neoplasm of breast: Secondary | ICD-10-CM

## 2013-06-25 ENCOUNTER — Encounter (HOSPITAL_COMMUNITY): Payer: Self-pay | Admitting: Emergency Medicine

## 2013-06-25 ENCOUNTER — Emergency Department (INDEPENDENT_AMBULATORY_CARE_PROVIDER_SITE_OTHER)
Admission: EM | Admit: 2013-06-25 | Discharge: 2013-06-25 | Disposition: A | Discharge status: Home / Self Care | Payer: Medicare Other | Source: Home / Self Care | Attending: Family Medicine | Admitting: Family Medicine

## 2013-06-25 ENCOUNTER — Telehealth: Payer: Self-pay | Admitting: *Deleted

## 2013-06-25 DIAGNOSIS — J111 Influenza due to unidentified influenza virus with other respiratory manifestations: Secondary | ICD-10-CM

## 2013-06-25 DIAGNOSIS — R69 Illness, unspecified: Principal | ICD-10-CM

## 2013-06-25 MED ORDER — OSELTAMIVIR PHOSPHATE 75 MG PO CAPS: 75.0000 mg | ORAL_CAPSULE | Freq: Two times a day (BID) | ORAL | Status: DC

## 2013-06-25 MED ORDER — MUCINEX DM 30-600 MG PO TB12: 1.0000 | ORAL_TABLET | Freq: Two times a day (BID) | ORAL | Status: DC

## 2013-06-25 NOTE — ED Provider Notes (Addendum)
CSN: [id number]     Arrival date & time 06/25/13  1425 History   None    Chief Complaint  Patient presents with  . URI   (Consider location/radiation/quality/duration/timing/severity/associated sxs/prior Treatment) Patient is a 68 y.o. female presenting with URI. The history is provided by the patient.  URI Presenting symptoms: congestion, cough, fatigue, rhinorrhea and sore throat   Severity:  Mild Onset quality:  Sudden Duration:  1 day Progression:  Worsening Chronicity:  New Relieved by:  None tried Worsened by:  Nothing tried Ineffective treatments:  None tried Associated symptoms: headaches and myalgias     Past Medical History  Diagnosis Date  . Asthma   . COPD (chronic obstructive pulmonary disease)   . Hypertension   . Diabetes mellitus 09/13/2010    glucophage  . Gout   . Oxygen dependent     pt uses O2   . Pericardial effusion 08/2011    small residual on echo  . Hyperlipidemia   . Type II or unspecified type diabetes mellitus with neurological manifestations, uncontrolled(250.62)   . Personal history of noncompliance with medical treatment, presenting hazards to health   . Unspecified disease of pericardium   . Hemorrhage of rectum and anus   . Panic disorder without agoraphobia   . Depressive disorder, not elsewhere classified   . Edema   . Anxiety state, unspecified   . Unspecified hereditary and idiopathic peripheral neuropathy   . Type II or unspecified type diabetes mellitus without mention of complication, not stated as uncontrolled   . Other and unspecified hyperlipidemia   . Obesity, unspecified   . Unspecified tinnitus   . Unspecified essential hypertension   . Pneumonia, organism unspecified   . Chronic obstructive asthma, unspecified   . Acute and chronic respiratory failure   . Urinary frequency   . Urgency of urination   . Anemia due to chronic blood loss 01/04/2013   Past Surgical History  Procedure Laterality Date  . Breast biopsy     both breasts  . Total abdominal hysterectomy    . Esophagogastroduodenoscopy N/A 01/03/2013    Procedure: ESOPHAGOGASTRODUODENOSCOPY (EGD);  Surgeon: Inda Castle, MD;  Location: [facility];  Service: Endoscopy;  Laterality: N/A;  . Colonoscopy N/A 01/05/2013    Procedure: COLONOSCOPY;  Surgeon: Gatha Mayer, MD;  Location: [place];  Service: Endoscopy;  Laterality: N/A;   Family History  Problem Relation Age of Onset  . COPD Mother   . COPD Sister   . Hypertension Sister   . Cancer Father   . Asthma Father   . Diabetes Other     3 maternal aunts  . Prostate cancer Paternal Uncle    History  Substance Use Topics  . Smoking status: Former Smoker -- 1.00 packs/day for 30 years    Types: Cigarettes    Quit date: 06/17/2004  . Smokeless tobacco: Never Used  . Alcohol Use: 1.0 oz/week    2 drink(s) per week     Comment: none iin a month   OB History   Grav Para Term Preterm Abortions TAB SAB Ect Mult Living                 Review of Systems  Constitutional: Positive for fatigue. Negative for chills.  HENT: Positive for congestion, rhinorrhea and sore throat.   Respiratory: Positive for cough.   Gastrointestinal: Negative.   Musculoskeletal: Positive for myalgias.  Skin: Negative.   Neurological: Positive for headaches.    Allergies  Lisinopril;  Advair diskus; and Penicillins  Home Medications   Current Outpatient Rx  Name  Route  Sig  Dispense  Refill  . Ferrous Sulfate (IRON) 325 (65 FE) MG TABS   Oral   Take 1 tablet by mouth daily.           . hydrALAZINE (APRESOLINE) 25 MG tablet   Oral   Take 1 tablet (25 mg total) by mouth every 8 (eight) hours.   90 tablet   3   . hydrochlorothiazide (HYDRODIURIL) 25 MG tablet   Oral   Take 1 tablet (25 mg total) by mouth daily.   30 tablet   3   . losartan (COZAAR) 100 MG tablet   Oral   Take 1 tablet (100 mg total) by mouth daily.   90 tablet   3   . lovastatin (MEVACOR) 20 MG tablet      Take  four tablets at bedtime to lower cholesterol   120 tablet   6   . metFORMIN (GLUCOPHAGE) 1000 MG tablet   Oral   Take 1 tablet (1,000 mg total) by mouth 2 (two) times daily with a meal.   180 tablet   1   . sertraline (ZOLOFT) 25 MG tablet   Oral   Take 2 tablets (50 mg total) by mouth at bedtime.   60 tablet   3   . tiotropium (SPIRIVA) 18 MCG inhalation capsule   Inhalation   Place 18 mcg into inhaler and inhale daily.         [name] albuterol (PROAIR HFA) 108 (90 BASE) MCG/ACT inhaler   Inhalation   Inhale 2 puffs into the lungs every 4 (four) hours as needed for shortness of breath.   1 Inhaler   5   . albuterol (PROVENTIL) (2.5 MG/3ML) 0.083% nebulizer solution   Nebulization   Take 2.5 mg by nebulization every 6 (six) hours as needed for shortness of breath.          . budesonide-formoterol (SYMBICORT) 80-4.5 MCG/ACT inhaler   Inhalation   Inhale 2 puffs into the lungs 2 (two) times daily.   4 Inhaler   3   . colchicine 0.6 MG tablet   Oral   Take 1 tablet (0.6 mg total) by mouth daily.   20 tablet   0   . Dextromethorphan-Guaifenesin (MUCINEX DM) 30-600 MG TB12   Oral   Take 1 tablet by mouth 2 (two) times daily.   28 each   0   . oseltamivir (TAMIFLU) 75 MG capsule   Oral   Take 1 capsule (75 mg total) by mouth every 12 (twelve) hours. For flu Take all of medication.   10 capsule   0   . pantoprazole (PROTONIX) 40 MG tablet   Oral   Take 1 tablet (40 mg total) by mouth daily at 12 noon.   30 tablet   0    BP 132/76  Pulse 94  Temp(Src) 98.5 F (36.9 C) (Oral)  Resp 16  SpO2 100% Physical Exam  Nursing note and vitals reviewed. Constitutional: She is oriented to person, place, and time. She appears well-developed and well-nourished.  HENT:  Head: Normocephalic.  Right Ear: External ear normal.  Left Ear: External ear normal.  Mouth/Throat: Oropharynx is clear and moist.  Eyes: Conjunctivae are normal. Pupils are equal, round, and  reactive to light.  Neck: Normal range of motion. Neck supple.  Cardiovascular: Normal rate, regular rhythm, normal heart sounds and intact distal pulses.  Pulmonary/Chest: Effort normal and breath sounds normal.  O2 dependent COPD  Abdominal: Soft. Bowel sounds are normal.  Lymphadenopathy:    She has no cervical adenopathy.  Neurological: She is alert and oriented to person, place, and time.  Skin: Skin is warm and dry.    ED Course  Procedures (including critical care time) Labs Review Labs Reviewed - No data to display Imaging Review No results found.  EKG Interpretation    Date/Time:    Ventricular Rate:    PR Interval:    QRS Duration:   QT Interval:    QTC Calculation:   R Axis:     Text Interpretation:              MDM      Billy Fischer, MD 06/25/13 1621  Billy Fischer, MD 06/27/13 2005

## 2013-06-25 NOTE — ED Notes (Signed)
C/o productive cough.  Scratchy throat.  Sinus pressure.  Headache.  Denies fever, n/v/d.  Pt has not tried any otc meds for symptoms.   Symptoms present since yesterday.

## 2013-06-25 NOTE — Telephone Encounter (Signed)
Pt called with symptoms such as sinus pain/pressure, stuffiness, scratchy throat, cough, chills and aches since last night.  Since there is no provider her today, she was advised to go to the Urgent Care on [facility] to be check out due to her having COPD. Pt aware/agreeable

## 2013-07-15 ENCOUNTER — Ambulatory Visit (INDEPENDENT_AMBULATORY_CARE_PROVIDER_SITE_OTHER): Payer: Medicare Other | Admitting: Internal Medicine

## 2013-07-15 ENCOUNTER — Encounter: Payer: Self-pay | Admitting: Internal Medicine

## 2013-07-15 VITALS — BP 144/82 | HR 86 | Temp 97.8°F | Wt 187.4 lb

## 2013-07-15 DIAGNOSIS — E782 Mixed hyperlipidemia: Secondary | ICD-10-CM

## 2013-07-15 DIAGNOSIS — D509 Iron deficiency anemia, unspecified: Secondary | ICD-10-CM

## 2013-07-15 DIAGNOSIS — IMO0002 Reserved for concepts with insufficient information to code with codable children: Secondary | ICD-10-CM

## 2013-07-15 DIAGNOSIS — J449 Chronic obstructive pulmonary disease, unspecified: Secondary | ICD-10-CM

## 2013-07-15 DIAGNOSIS — E1165 Type 2 diabetes mellitus with hyperglycemia: Secondary | ICD-10-CM

## 2013-07-15 DIAGNOSIS — R05 Cough: Secondary | ICD-10-CM

## 2013-07-15 DIAGNOSIS — J4489 Other specified chronic obstructive pulmonary disease: Secondary | ICD-10-CM

## 2013-07-15 DIAGNOSIS — R059 Cough, unspecified: Secondary | ICD-10-CM

## 2013-07-15 DIAGNOSIS — E669 Obesity, unspecified: Secondary | ICD-10-CM

## 2013-07-15 DIAGNOSIS — E118 Type 2 diabetes mellitus with unspecified complications: Principal | ICD-10-CM

## 2013-07-15 NOTE — Progress Notes (Signed)
Patient ID: [name], female   DOB: [birth year], 68 y.o.   MRN: [id number]   Location:  [facility] / [facility]  Code Status: full code  Allergies  Allergen Reactions  . Lisinopril Swelling    Angioedema   . Advair Diskus [Fluticasone-Salmeterol] Other (See Comments)    Nose bleeds.   . Penicillins     hives    Chief Complaint  Patient presents with  . Medical Managment of Chronic Issues    3 month f/u   . other    coughing up whitish colored phlegm & having a tickle in her throat x 1 week.    HPI: Patient is a 68 y.o. black female seen in the office today for medical mgt of chronic diseases and increased cough with sputum production--is loose and white.  Used warm salt water, honey, lozenges and coughed and it won't go away.  No fever, chills.  Did have cold two weeks ago but sputum was thick and white then.    Review of Systems:  Review of Systems  Constitutional: Negative for fever, chills and malaise/fatigue.  HENT: Negative for congestion.   Eyes: Negative for blurred vision.  Respiratory: Positive for cough and sputum production.   Cardiovascular: Negative for chest pain.  Gastrointestinal: Negative for abdominal pain.  Genitourinary: Negative for dysuria.  Musculoskeletal: Negative for falls.  Neurological: Negative for dizziness and weakness.  Psychiatric/Behavioral: Negative for depression and memory loss.     Past Medical History  Diagnosis Date  . Asthma   . COPD (chronic obstructive pulmonary disease)   . Hypertension   . Diabetes mellitus 09/13/2010    glucophage  . Gout   . Oxygen dependent     pt uses O2   . Pericardial effusion 08/2011    small residual on echo  . Hyperlipidemia   . Type II or unspecified type diabetes mellitus with neurological manifestations, uncontrolled   . Personal history of noncompliance with medical treatment, presenting hazards to health   . Unspecified disease of pericardium   .  Hemorrhage of rectum and anus   . Panic disorder without agoraphobia   . Depressive disorder, not elsewhere classified   . Edema   . Anxiety state, unspecified   . Unspecified hereditary and idiopathic peripheral neuropathy   . Type II or unspecified type diabetes mellitus without mention of complication, not stated as uncontrolled   . Other and unspecified hyperlipidemia   . Obesity, unspecified   . Unspecified tinnitus   . Unspecified essential hypertension   . Pneumonia, organism unspecified   . Chronic obstructive asthma, unspecified   . Acute and chronic respiratory failure   . Urinary frequency   . Urgency of urination   . Anemia due to chronic blood loss 01/04/2013    Past Surgical History  Procedure Laterality Date  . Breast biopsy      both breasts  . Total abdominal hysterectomy    . Esophagogastroduodenoscopy N/A 01/03/2013    Procedure: ESOPHAGOGASTRODUODENOSCOPY (EGD);  Surgeon: Inda Castle, MD;  Location: [place];  Service: Endoscopy;  Laterality: N/A;  . Colonoscopy N/A 01/05/2013    Procedure: COLONOSCOPY;  Surgeon: Gatha Mayer, MD;  Location: [place];  Service: Endoscopy;  Laterality: N/A;    Social History:   reports that she quit smoking about 9 years ago. Her smoking use included Cigarettes. She has a 30 pack-year smoking history. She has never used smokeless tobacco. She reports that she drinks about  1.0 ounces of alcohol per week. She reports that she does not use illicit drugs.  Family History  Problem Relation Age of Onset  . COPD Mother   . COPD Sister   . Hypertension Sister   . Cancer Father   . Asthma Father   . Diabetes Other     3 maternal aunts  . Prostate cancer Paternal Uncle     Medications: Patient's Medications  New Prescriptions   No medications on file  Previous Medications   ALBUTEROL (PROAIR HFA) 108 (90 BASE) MCG/ACT INHALER    Inhale 2 puffs into the lungs every 4 (four) hours as needed for shortness of breath.     ALBUTEROL (PROVENTIL) (2.5 MG/3ML) 0.083% NEBULIZER SOLUTION    Take 2.5 mg by nebulization every 6 (six) hours as needed for shortness of breath.    BUDESONIDE-FORMOTEROL (SYMBICORT) 80-4.5 MCG/ACT INHALER    Inhale 2 puffs into the lungs 2 (two) times daily.   COLCHICINE 0.6 MG TABLET    Take 1 tablet (0.6 mg total) by mouth daily.   DEXTROMETHORPHAN-GUAIFENESIN (MUCINEX DM) 30-600 MG TB12    Take 1 tablet by mouth 2 (two) times daily.   FERROUS SULFATE (IRON) 325 (65 FE) MG TABS    Take 1 tablet by mouth daily.     HYDRALAZINE (APRESOLINE) 25 MG TABLET    Take 1 tablet (25 mg total) by mouth every 8 (eight) hours.   HYDROCHLOROTHIAZIDE (HYDRODIURIL) 25 MG TABLET    Take 1 tablet (25 mg total) by mouth daily.   LOSARTAN (COZAAR) 100 MG TABLET    Take 1 tablet (100 mg total) by mouth daily.   LOVASTATIN (MEVACOR) 20 MG TABLET    Take four tablets at bedtime to lower cholesterol   METFORMIN (GLUCOPHAGE) 1000 MG TABLET    Take 1 tablet (1,000 mg total) by mouth 2 (two) times daily with a meal.   PANTOPRAZOLE (PROTONIX) 40 MG TABLET    Take 1 tablet (40 mg total) by mouth daily at 12 noon.   SERTRALINE (ZOLOFT) 25 MG TABLET    Take 2 tablets (50 mg total) by mouth at bedtime.   TIOTROPIUM (SPIRIVA) 18 MCG INHALATION CAPSULE    Place 18 mcg into inhaler and inhale daily.  Modified Medications   No medications on file  Discontinued Medications   OSELTAMIVIR (TAMIFLU) 75 MG CAPSULE    Take 1 capsule (75 mg total) by mouth every 12 (twelve) hours. For flu Take all of medication.     Physical Exam: Filed Vitals:   07/15/13 1139  BP: 144/82  Pulse: 86  Temp: 97.8 F (36.6 C)  TempSrc: Oral  Weight: 187 lb 6.4 oz (85.004 kg)  SpO2: 92%  Physical Exam  Constitutional: She is oriented to person, place, and time. She appears well-developed and well-nourished. No distress.  Cardiovascular: Normal rate, regular rhythm, normal heart sounds and intact distal pulses.   Pulmonary/Chest: Effort  normal. No respiratory distress. She has wheezes.  O2 dependent  Abdominal: Soft. Bowel sounds are normal. She exhibits no distension and no mass. There is no tenderness.  Musculoskeletal: Normal range of motion.  Walks with rollator walker primarily to transport her oxygen  Neurological: She is alert and oriented to person, place, and time.  Psychiatric: She has a normal mood and affect.     Labs reviewed: Basic Metabolic Panel:  Recent Labs  01/01/13 1723 01/02/13 0550 04/15/13 1046  NA 140 141 143  K 3.9 3.5 3.9  CL 100  102 97  CO2 33* 29 32*  GLUCOSE 113* 104* 104*  BUN 14 12 20   CREATININE 0.99 0.98 1.08*  CALCIUM 9.4 9.4 9.7   Liver Function Tests:  Recent Labs  11/09/12 0425 04/15/13 1046  AST 18 12  ALT 18 13  ALKPHOS 66 68  BILITOT 0.2* 0.2  PROT 6.8 6.4  ALBUMIN 3.1*  --    No results found for this basename: LIPASE, AMYLASE,  in the last 8760 hours No results found for this basename: AMMONIA,  in the last 8760 hours CBC:  Recent Labs  01/01/13 1723 01/02/13 0550 01/03/13 0720  01/05/13 0440 01/28/13 1448 04/15/13 1046  WBC 5.5 5.4 6.0  --  8.4 5.8 5.2  NEUTROABS 3.8  --   --   --   --  4.0 3.3  HGB 6.6* 8.0* 8.3*  < > 9.2* 10.5* 11.8  HCT 21.7* 25.8* 27.0*  < > 29.1* 34.3 37.4  MCV 88.9 87.2 89.1  --  87.9 88 85  PLT 346 342 342  --  297  --   --   < > = values in this interval not displayed. Lipid Panel:  Recent Labs  04/15/13 1046  HDL 80  LDLCALC 165*  TRIG 73  CHOLHDL 3.3   Lab Results  Component Value Date   HGBA1C 6.4* 04/15/2013    Assessment/Plan 1. Type II or unspecified type diabetes mellitus with unspecified complication, uncontrolled -tells me sugars have been stable, f/u labs, cont low carb diet - Hemoglobin A1c -cont metformin, kidneys stable  2. COPD (chronic obstructive pulmonary disease) with chronic bronchitis - had recent URI with exacerbation, seems resolved - Comprehensive metabolic panel -cont  symbicort, spiriva, albuterol  3. Obesity -encouraged adherence to diet and more exercise as she can tolerate - Comprehensive metabolic panel  4. Mixed hyperlipidemia - cont mevacor and diet and exercise - Lipid panel  5. Iron deficiency anemia - cont daily iron supplement with adequate fiber in diet - CBC with Differential  6. Cough -chronic, mild residual at this point from URI also, but seems much better  Labs/tests ordered: Orders Placed This Encounter  Procedures  . CBC with Differential  . Comprehensive metabolic panel  . Hemoglobin A1c  . Lipid panel    Next appt:  3 mos

## 2013-07-16 LAB — CBC WITH DIFFERENTIAL/PLATELET
Basophils Absolute: 0 10*3/uL (ref 0.0–0.2)
Basos: 0 %
Eos: 2 %
Eosinophils Absolute: 0.1 10*3/uL (ref 0.0–0.4)
HCT: 37 % (ref 34.0–46.6)
Hemoglobin: 11.8 g/dL (ref 11.1–15.9)
Immature Grans (Abs): 0 10*3/uL (ref 0.0–0.1)
Immature Granulocytes: 0 %
Lymphocytes Absolute: 1.6 10*3/uL (ref 0.7–3.1)
Lymphs: 28 %
MCH: 28.4 pg (ref 26.6–33.0)
MCHC: 31.9 g/dL (ref 31.5–35.7)
MCV: 89 fL (ref 79–97)
Monocytes Absolute: 0.6 10*3/uL (ref 0.1–0.9)
Monocytes: 10 %
Neutrophils Absolute: 3.4 10*3/uL (ref 1.4–7.0)
Neutrophils Relative %: 60 %
RBC: 4.16 x10E6/uL (ref 3.77–5.28)
RDW: 16.1 % — ABNORMAL HIGH (ref 12.3–15.4)
WBC: 5.6 10*3/uL (ref 3.4–10.8)

## 2013-07-16 LAB — COMPREHENSIVE METABOLIC PANEL
ALT: 13 IU/L (ref 0–32)
AST: 14 IU/L (ref 0–40)
Albumin/Globulin Ratio: 1.7 (ref 1.1–2.5)
Albumin: 4.2 g/dL (ref 3.6–4.8)
Alkaline Phosphatase: 64 IU/L (ref 39–117)
BUN/Creatinine Ratio: 16 (ref 11–26)
BUN: 17 mg/dL (ref 8–27)
CO2: 32 mmol/L — ABNORMAL HIGH (ref 18–29)
Calcium: 9.6 mg/dL (ref 8.7–10.3)
Chloride: 98 mmol/L (ref 97–108)
Creatinine, Ser: 1.06 mg/dL — ABNORMAL HIGH (ref 0.57–1.00)
GFR calc Af Amer: 63 mL/min/{1.73_m2} (ref >59)
GFR calc non Af Amer: 54 mL/min/{1.73_m2} — ABNORMAL LOW (ref >59)
Globulin, Total: 2.5 g/dL (ref 1.5–4.5)
Glucose: 88 mg/dL (ref 65–99)
Potassium: 4 mmol/L (ref 3.5–5.2)
Sodium: 144 mmol/L (ref 134–144)
Total Bilirubin: 0.3 mg/dL (ref 0.0–1.2)
Total Protein: 6.7 g/dL (ref 6.0–8.5)

## 2013-07-16 LAB — LIPID PANEL
Chol/HDL Ratio: 2.8 ratio units (ref 0.0–4.4)
Cholesterol, Total: 220 mg/dL — ABNORMAL HIGH (ref 100–199)
HDL: 79 mg/dL (ref >39)
LDL Calculated: 127 mg/dL — ABNORMAL HIGH (ref 0–99)
Triglycerides: 71 mg/dL (ref 0–149)
VLDL Cholesterol Cal: 14 mg/dL (ref 5–40)

## 2013-07-16 LAB — HEMOGLOBIN A1C
Est. average glucose Bld gHb Est-mCnc: 126 mg/dL
Hgb A1c MFr Bld: 6 % — ABNORMAL HIGH (ref 4.8–5.6)

## 2013-07-20 ENCOUNTER — Telehealth: Payer: Self-pay | Admitting: *Deleted

## 2013-07-20 NOTE — Telephone Encounter (Signed)
Patient called wanting an antibiotic called into the pharmacy. Patient stated that she did not want to be seen and just wanted the medication called in because she said she was just seen. Told patient that she was just seen for a follow up not for an acute visit. Told patient we would have to evaluate her and offered her an appointment for today at 3:00. Patient refused and stated that she would just go to Urgent Care and hung up on me.

## 2013-07-29 ENCOUNTER — Ambulatory Visit: Payer: Medicare PPO | Admitting: Internal Medicine

## 2013-08-02 ENCOUNTER — Ambulatory Visit: Payer: Medicare PPO | Admitting: Internal Medicine

## 2013-08-03 ENCOUNTER — Ambulatory Visit: Payer: Medicare Other | Admitting: Internal Medicine

## 2013-08-12 ENCOUNTER — Ambulatory Visit: Payer: Medicare Other | Admitting: Internal Medicine

## 2013-08-16 ENCOUNTER — Ambulatory Visit (INDEPENDENT_AMBULATORY_CARE_PROVIDER_SITE_OTHER): Payer: Medicare Other | Admitting: Internal Medicine

## 2013-08-16 ENCOUNTER — Encounter: Payer: Self-pay | Admitting: Internal Medicine

## 2013-08-16 VITALS — BP 120/82 | HR 118 | Ht 62.5 in | Wt 189.2 lb

## 2013-08-16 DIAGNOSIS — Z23 Encounter for immunization: Secondary | ICD-10-CM

## 2013-08-16 DIAGNOSIS — J449 Chronic obstructive pulmonary disease, unspecified: Secondary | ICD-10-CM

## 2013-08-16 NOTE — Progress Notes (Signed)
Subjective:    Patient ID: [name], female    DOB: Jan 12, 1946, 68 y.o.   MRN: [id number]  HPI    1. Severe COPD -O2 depenedent (advair causes epistaxis)  tonic respiratory failure- MM phenotype Nov 2012  - Spirometry Nov 2012: Fev1 0.85L/45%, Ratio - CAT score 10 - May 2013 2. Recurrent AECOPD -     - Oct 2011, JAn 2012, March 2012 and August 2012, Sept 2013  - hospitalized may 2014 3. Small but increasing pericardial effusion on CT jan and march 2012. Moderate Rt atrial collapse in August 2012 - Dr Acie Fredrickson since Aug 2012  - Improved small pericardial effusion 11/08/2012 4. Isolated social situation.  5. Ex smoker  6.hx of snoring and excess daytime somnolence but not using cpap; no dx of OSA.  7. Body mass index is 33.98 kg/(m^2). - on 08/01/2011  - Body mass index is 34.03 kg/(m^2). on 08/16/2013    8. lung cancer screening  - CT angiogram chest done for pulmonary embolism 11/08/2012: No evidence of lung cancer - Low-dose CT scan of the chest to be done after June 2015        OV 02/28/2012  Followup COPD. Was doing baseline and well. A weeik or so ago develiped insidious onset of worsening  cough and increase white phlegm; now improving to nearly resolved. Denies change in dyspnea or fever, or colored sputum. CAT score shows doubling of symptoms to scoe 19 (was 10 at last visit). Otherwise well  REC   #COPD  Your severe copd is in the end of a mild flare right now  Glad you finished rehab  Continue spiriva, symbicort and oxygen  Take doxycycline 100mg  po twice daily x 5 days; take after meals and avoid sunlight  Take prednisone 40 mg daily x 2 days, then 20mg  daily x 2 days, then 10mg  daily x 2 days, and stop  Call anytime if you feel symptoms are getting worse or any change  At followup do CAT SCORE  #immunization  - have flu shot today  - nurse will call [facility], [place], DO office and check status of your pneumonia vaccine  #Followup  3 months or  sooner if needed   OV 11/16/2012  Hospitalized 11/06/2012 through 11/10/2012 with COPD exacerbation  and acute renal failure (creatinine rose from 0.9 mg percent 1.2 mg percent]. CT angiogram chest ruled out pulmonary embolism under [facility] hospitalist. She now presents for office followup. She's feeling much better. She ran out of Symbicort couple weeks ago. Currently maintained only on Spiriva. Though better because of recent hospitalization and lack of Symbicort ,  COPD cat score is worse than her baseline and is 21 currently. She is complaining of significant cost of $42 per month for each inhaler of Spiriva and Symbicort. Currently she has many months supply of her Spiriva but has no supply of Symbicort. Symbicort helps her and she is unwilling to switch to nebulizer because of the gain from Symbicort and also because of significant supply of Spiriva that is present at this point in time. She maintains compliance with treatment  Of note, when she was discharged from the hospital she was prescribed azithromycin and prednisone taper but she did not fill these medications and has not started this.   OV 08/16/2013  Chief Complaint  Patient presents with  . COPD    follow-up. Pt states SOB is same. She states she has some bad days on and off.    Followup severe  COPD with chronic respiratory failure. Last seen June 2014. Since then she's been doing well with good days and bad days. Overall COPD stable. She is compliant with oxygen, Spiriva and Symbicort. She continues to [employer] with a walker. She is frustrated that after an admission and summer 2014 she was discharged her in a day from [place] in the hospital with advice to take a bus home. Smoking continues to be in remission. She has not had Prevnar vaccine but will have it today. COPD cat score is  Discussed research trials for COPD by [employer] and [employer]: she fully understands the studies and is interested and is willing and able if she qualifies  for them. She understands this is research and not treatment  Last CT scan of the chest was in May 2014 that showed no evidence of nodules a lung cancer   CAT SCORE 11/04/11 02/28/2012  11/16/2012 Post hospitalization for COPD flare   Never Cough -> Cough all the time 0 3 0  No phlegm in chest -> Chest is full of phlegm 0 2 2  No chest tightness -> Chest feels very tight 3 1 2   No dyspnea for 1 flight stairs/hill -> Very dyspneic for 1 flight of stairs 5 4 5   No limitations for ADL at home -> Very limited with ADL at home 0 2 5  Confident leaving home -> Not at all confident leaving home 0 1 3  Sleep soundly -> Do not sleep soundly because of lung condition 0 3 3  Lots of Energy -> No energy at all 2 3 1   TOTAL Score (max 40)  10 19 21     Past, Family, Social reviewed: no change since last visit except July 2014: 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  HENT: Negative for congestion, dental problem, ear pain, nosebleeds, postnasal drip, rhinorrhea, sinus pressure, sneezing, sore throat and trouble swallowing.   Eyes: Negative for redness and itching.  Respiratory: Positive for shortness of breath. Negative for cough, chest tightness and wheezing.   Cardiovascular: Negative for palpitations and leg swelling.  Gastrointestinal: Negative for nausea and vomiting.  Genitourinary: Negative for dysuria.  Musculoskeletal: Negative for joint swelling.  Skin: Negative for rash.  Neurological: Negative for headaches.  Hematological: Does not bruise/bleed easily.  Psychiatric/Behavioral: Negative for dysphoric mood. The patient is not nervous/anxious.        Objective:   Physical Exam   Vitals reviewed. Constitutional: She is oriented to person, place, and time. She appears well-developed and well-nourished. No distress.  Chronically ill looking  Female Has walker with oxygen on  HENT:  Head: Normocephalic  and atraumatic.  Right Ear: External ear normal.  Left Ear: External ear normal.  Mouth/Throat: Oropharynx is clear and moist. No oropharyngeal exudate.  Eyes: Conjunctivae and EOM are normal. Pupils are equal, round, and reactive to light. Right eye exhibits no discharge. Left eye exhibits no discharge. No scleral icterus.  Neck: Normal range of motion. Neck supple. No JVD present. No tracheal deviation present. No thyromegaly present.  Cardiovascular: Normal rate, regular rhythm, normal heart sounds and intact distal pulses.  Exam reveals no gallop and no friction rub.   No murmur heard. Pulmonary/Chest: Effort normal and breath sounds normal. No respiratory distress. She has no wheezes. She has no rales. She exhibits no tenderness.  Abdominal: Soft. Bowel sounds are normal. She exhibits no distension and no mass. There  is no tenderness. There is no rebound and no guarding.  Musculoskeletal: Normal range of motion. She exhibits no edema and no tenderness.  Lymphadenopathy:    She has no cervical adenopathy.  Neurological: She is alert and oriented to person, place, and time. She has normal reflexes. No cranial nerve deficit. She exhibits normal muscle tone. Coordination normal.  Skin: Skin is warm and dry. No rash noted. She is not diaphoretic. No erythema. No pallor.  Psychiatric: She has a normal mood and affect. Her behavior is normal. Judgment and thought content normal.  Filed Vitals:   08/16/13 1401  BP: 120/82  Pulse: 118  Height: 5' 2.5" (1.588 m)  Weight: 85.821 kg (189 lb 3.2 oz)  SpO2: 91%        Assessment & Plan:

## 2013-08-16 NOTE — Patient Instructions (Signed)
#  COPD - Glad COPD is stable  - Continue Spiriva 1 puff daily -  Symbicort 80/4.5, 2 puff 2 times daily; - Continue oxygen as before - Next visit will talk N-acetyl cystein - try mucinex as needed - PREVNAR vaccine 08/16/2013 if meets criteria - will have research coordinator contact you about COPD trials   #Followup - 3 months with COPD cat score - Flu shot in the fall

## 2013-08-22 NOTE — Assessment & Plan Note (Signed)
#  COPD - Glad COPD is stable  - Continue Spiriva 1 puff daily -  Symbicort 80/4.5, 2 puff 2 times daily; - Continue oxygen as before - Next visit will talk N-acetyl cystein - try mucinex as needed - PREVNAR vaccine 08/16/2013 if meets criteria - will have research coordinator contact you about COPD trials   #Followup - 3 months with COPD cat score - Flu shot in the fall  

## 2013-10-14 ENCOUNTER — Ambulatory Visit: Payer: Medicare PPO | Admitting: Internal Medicine

## 2013-10-22 ENCOUNTER — Encounter: Payer: Self-pay | Admitting: Internal Medicine

## 2013-10-22 ENCOUNTER — Ambulatory Visit (INDEPENDENT_AMBULATORY_CARE_PROVIDER_SITE_OTHER): Payer: Medicare Other | Admitting: Internal Medicine

## 2013-10-22 VITALS — BP 140/82 | HR 77 | Temp 97.9°F | Resp 10 | Wt 194.0 lb

## 2013-10-22 DIAGNOSIS — E118 Type 2 diabetes mellitus with unspecified complications: Secondary | ICD-10-CM

## 2013-10-22 DIAGNOSIS — IMO0002 Reserved for concepts with insufficient information to code with codable children: Secondary | ICD-10-CM

## 2013-10-22 DIAGNOSIS — J449 Chronic obstructive pulmonary disease, unspecified: Secondary | ICD-10-CM

## 2013-10-22 DIAGNOSIS — E1165 Type 2 diabetes mellitus with hyperglycemia: Secondary | ICD-10-CM

## 2013-10-22 DIAGNOSIS — J4489 Other specified chronic obstructive pulmonary disease: Secondary | ICD-10-CM

## 2013-10-22 DIAGNOSIS — D509 Iron deficiency anemia, unspecified: Secondary | ICD-10-CM

## 2013-10-22 DIAGNOSIS — E669 Obesity, unspecified: Secondary | ICD-10-CM

## 2013-10-22 DIAGNOSIS — F411 Generalized anxiety disorder: Secondary | ICD-10-CM | POA: Insufficient documentation

## 2013-10-22 DIAGNOSIS — E782 Mixed hyperlipidemia: Secondary | ICD-10-CM

## 2013-10-22 MED ORDER — SERTRALINE HCL 50 MG PO TABS: 50.0000 mg | ORAL_TABLET | Freq: Every day | ORAL | Status: DC

## 2013-10-22 MED ORDER — TIOTROPIUM BROMIDE MONOHYDRATE 18 MCG IN CAPS: 18.0000 ug | ORAL_CAPSULE | Freq: Every day | RESPIRATORY_TRACT | Status: DC

## 2013-10-22 NOTE — Progress Notes (Signed)
Patient ID: [name], female   DOB: 12/27/1945, 68 y.o.   MRN: [id number]   Location:  [facility]  Code Status: full code  Allergies  Allergen Reactions  . Lisinopril Swelling    Angioedema   . Advair Diskus [Fluticasone-Salmeterol] Other (See Comments)    Nose bleeds.   . Penicillins     hives    Chief Complaint  Patient presents with  . Medical Management of Chronic Issues    3 month follow-up, no recent labs (not fasting today)     HPI: Patient is a 68 y.o. black female with late stage COPD seen in the office today for medical mgt of chronic diseases. Is doing well.  Says feisty as ever.   Breathing unchanged.  Has good and bad days.   [facility] finally accepted her--now has gotten 3 symbicort inhalers.  Low on some of her meds.  Needs spiriva and zoloft refilled.   Is happy, mood good.  Would be better if she had money.  Is playing cards and going out to eat with friends.   Generally, eats a healthy diet.   Usually doesn't need neb treatments when uses symbicort and spiriva.    Review of Systems:  Review of Systems  Constitutional: Negative for fever and malaise/fatigue.  HENT: Negative for congestion.   Eyes: Negative for blurred vision.  Respiratory: Positive for shortness of breath. Negative for cough.   Cardiovascular: Negative for chest pain and leg swelling.  Gastrointestinal: Negative for constipation and blood in stool.       Notes distention of her abdomen that does not go away  Genitourinary: Negative for dysuria, urgency and frequency.  Musculoskeletal: Negative for back pain, falls and joint pain.  Skin: Negative for itching and rash.  Neurological: Negative for weakness.     Past Medical History  Diagnosis Date  . Asthma   . COPD (chronic obstructive pulmonary disease)   . Hypertension   . Diabetes mellitus 09/13/2010    glucophage  . Gout   . Oxygen dependent     pt uses O2   .  Pericardial effusion 08/2011    small residual on echo  . Hyperlipidemia   . Type II or unspecified type diabetes mellitus with neurological manifestations, uncontrolled   . Personal history of noncompliance with medical treatment, presenting hazards to health   . Unspecified disease of pericardium   . Hemorrhage of rectum and anus   . Panic disorder without agoraphobia   . Depressive disorder, not elsewhere classified   . Edema   . Anxiety state, unspecified   . Unspecified hereditary and idiopathic peripheral neuropathy   . Type II or unspecified type diabetes mellitus without mention of complication, not stated as uncontrolled   . Other and unspecified hyperlipidemia   . Obesity, unspecified   . Unspecified tinnitus   . Unspecified essential hypertension   . Pneumonia, organism unspecified   . Chronic obstructive asthma, unspecified   . Acute and chronic respiratory failure   . Urinary frequency   . Urgency of urination   . Anemia due to chronic blood loss 01/04/2013    Past Surgical History  Procedure Laterality Date  . Breast biopsy      both breasts  . Total abdominal hysterectomy    . Esophagogastroduodenoscopy N/A 01/03/2013    Procedure: ESOPHAGOGASTRODUODENOSCOPY (EGD);  Surgeon: Inda Castle, MD;  Location: [place];  Service: Endoscopy;  Laterality: N/A;  . Colonoscopy  N/A 01/05/2013    Procedure: COLONOSCOPY;  Surgeon: Gatha Mayer, MD;  Location: [place];  Service: Endoscopy;  Laterality: N/A;    Social History:   reports that she quit smoking about 9 years ago. Her smoking use included Cigarettes. She has a 30 pack-year smoking history. She has never used smokeless tobacco. She reports that she drinks about one ounce of alcohol per week. She reports that she does not use illicit drugs.  Family History  Problem Relation Age of Onset  . COPD Mother   . COPD Sister   . Hypertension Sister   . Cancer Father   . Asthma Father   . Diabetes Other     3  maternal aunts  . Prostate cancer Paternal Uncle     Medications: Patient's Medications  New Prescriptions   No medications on file  Previous Medications   ALBUTEROL (PROAIR HFA) 108 (90 BASE) MCG/ACT INHALER    Inhale 2 puffs into the lungs every 4 (four) hours as needed for shortness of breath.   ALBUTEROL (PROVENTIL) (2.5 MG/3ML) 0.083% NEBULIZER SOLUTION    Take 2.5 mg by nebulization every 6 (six) hours as needed for shortness of breath.    BUDESONIDE-FORMOTEROL (SYMBICORT) 80-4.5 MCG/ACT INHALER    Inhale 2 puffs into the lungs 2 (two) times daily.   FERROUS SULFATE (IRON) 325 (65 FE) MG TABS    Take 1 tablet by mouth daily.     HYDRALAZINE (APRESOLINE) 25 MG TABLET    Take 1 tablet (25 mg total) by mouth every 8 (eight) hours.   HYDROCHLOROTHIAZIDE (HYDRODIURIL) 25 MG TABLET    Take 1 tablet (25 mg total) by mouth daily.   LOSARTAN (COZAAR) 100 MG TABLET    Take 1 tablet (100 mg total) by mouth daily.   LOVASTATIN (MEVACOR) 20 MG TABLET    Take four tablets at bedtime to lower cholesterol   METFORMIN (GLUCOPHAGE) 1000 MG TABLET    Take 1 tablet (1,000 mg total) by mouth 2 (two) times daily with a meal.   SERTRALINE (ZOLOFT) 25 MG TABLET    Take 2 tablets (50 mg total) by mouth at bedtime.   TIOTROPIUM (SPIRIVA) 18 MCG INHALATION CAPSULE    Place 18 mcg into inhaler and inhale daily.  Modified Medications   Modified Medication Previous Medication   COLCHICINE 0.6 MG TABLET colchicine 0.6 MG tablet      Take 0.6 mg by mouth as needed.    Take 1 tablet (0.6 mg total) by mouth daily.   DEXTROMETHORPHAN-GUAIFENESIN (MUCINEX DM) 30-600 MG TB12 Dextromethorphan-Guaifenesin (MUCINEX DM) 30-600 MG TB12      Take 1 tablet by mouth 2 (two) times daily as needed.    Take 1 tablet by mouth 2 (two) times daily.   PANTOPRAZOLE (PROTONIX) 40 MG TABLET pantoprazole (PROTONIX) 40 MG tablet      Take 40 mg by mouth as needed.    Take 1 tablet (40 mg total) by mouth daily at 12 noon.  Discontinued  Medications   No medications on file     Physical Exam: Filed Vitals:   10/22/13 1042  BP: 140/82  Pulse: 77  Temp: 97.9 F (36.6 C)  TempSrc: Oral  Resp: 10  Weight: 194 lb (87.998 kg)  SpO2: 95%  Physical Exam  Constitutional: She is oriented to person, place, and time. No distress.  Does get dyspneic on exertion, but better today than usual  Cardiovascular: Normal rate, regular rhythm, normal heart sounds and intact distal  pulses.   Pulmonary/Chest:  Requires increased effort while ambulating, comfortable at rest wearing her O2;  Prolonged expiratory phase, but no wheezing today  Abdominal: Soft. Bowel sounds are normal. She exhibits distension. She exhibits no mass. There is no tenderness.  Soft adipose tissue  Musculoskeletal: Normal range of motion. She exhibits no edema and no tenderness.  Uses rollator walker  Neurological: She is alert and oriented to person, place, and time. No cranial nerve deficit.  Skin: Skin is warm and dry.  Psychiatric: She has a normal mood and affect.    Labs reviewed: Basic Metabolic Panel:  Recent Labs  01/02/13 0550 04/15/13 1046 07/15/13 1218  NA 141 143 144  K 3.5 3.9 4.0  CL 102 97 98  CO2 29 32* 32*  GLUCOSE 104* 104* 88  BUN 12 20 17   CREATININE 0.98 1.08* 1.06*  CALCIUM 9.4 9.7 9.6   Liver Function Tests:  Recent Labs  11/09/12 0425 04/15/13 1046 07/15/13 1218  AST 18 12 14   ALT 18 13 13   ALKPHOS 66 68 64  BILITOT 0.2* 0.2 0.3  PROT 6.8 6.4 6.7  ALBUMIN 3.1*  --   --   CBC:  Recent Labs  01/02/13 0550 01/03/13 0720  01/05/13 0440 01/28/13 1448 04/15/13 1046 07/15/13 1218  WBC 5.4 6.0  --  8.4 5.8 5.2 5.6  NEUTROABS  --   --   --   --  4.0 3.3 3.4  HGB 8.0* 8.3*  < > 9.2* 10.5* 11.8 11.8  HCT 25.8* 27.0*  < > 29.1* 34.3 37.4 37.0  MCV 87.2 89.1  --  87.9 88 85 89  PLT 342 342  --  297  --   --   --   < > = values in this interval not displayed. Lipid Panel:  Recent Labs  04/15/13 1046  07/15/13 1218  HDL 80 79  LDLCALC 165* 127*  TRIG 73 71  CHOLHDL 3.3 2.8   Lab Results  Component Value Date   HGBA1C 6.0* 07/15/2013   Assessment/Plan 1. Anxiety state, unspecified - has been well controlled with the zoloft and generally her mood is good - sertraline (ZOLOFT) 50 MG tablet; Take 1 tablet (50 mg total) by mouth at bedtime.  Dispense: 30 tablet; Refill: 3  2. COPD (chronic obstructive pulmonary disease) with chronic bronchitis -needed medications renewed--getting through pharmaceutical company support program plus samples when possible due to cost - tiotropium (SPIRIVA) 18 MCG inhalation capsule; Place 1 capsule (18 mcg total) into inhaler and inhale daily. 2 samples provided today also Dispense: 30 capsule; Refill: 3 - Basic metabolic panel  3. Type II or unspecified type diabetes mellitus with unspecified complication, uncontrolled - says her glucose readings run under 200 - Hemoglobin B5Z - Basic metabolic panel  4. Obesity -is not very physically active due to her advanced COPD, but does eat a balanced diet that is generally healthy most of the time--unable to lose her "belly"  5. Mixed hyperlipidemia -has had high LDL and TG, most recently only LDL high--continues on statin--mevacor - Basic metabolic panel - Lipid panel  6. Iron deficiency anemia - prior GI bleed - cont daily iron and adequate fiber to prevent constipation - CBC With differential/Platelet  Labs/tests ordered: Orders Placed This Encounter  Procedures  . Hemoglobin A1c  . CBC With differential/Platelet  . Basic metabolic panel  . Lipid panel   Next appt:  3 mos

## 2013-10-23 LAB — CBC WITH DIFFERENTIAL
Basophils Absolute: 0 10*3/uL (ref 0.0–0.2)
Basos: 1 %
Eos: 4 %
Eosinophils Absolute: 0.2 10*3/uL (ref 0.0–0.4)
HCT: 37.2 % (ref 34.0–46.6)
Hemoglobin: 11.7 g/dL (ref 11.1–15.9)
Immature Grans (Abs): 0 10*3/uL (ref 0.0–0.1)
Immature Granulocytes: 0 %
Lymphocytes Absolute: 1.4 10*3/uL (ref 0.7–3.1)
Lymphs: 34 %
MCH: 28.3 pg (ref 26.6–33.0)
MCHC: 31.5 g/dL (ref 31.5–35.7)
MCV: 90 fL (ref 79–97)
Monocytes Absolute: 0.4 10*3/uL (ref 0.1–0.9)
Monocytes: 10 %
Neutrophils Absolute: 2.1 10*3/uL (ref 1.4–7.0)
Neutrophils Relative %: 51 %
Platelets: 242 10*3/uL (ref 150–379)
RBC: 4.13 x10E6/uL (ref 3.77–5.28)
RDW: 15.2 % (ref 12.3–15.4)
WBC: 4.1 10*3/uL (ref 3.4–10.8)

## 2013-10-23 LAB — BASIC METABOLIC PANEL
BUN/Creatinine Ratio: 23 (ref 11–26)
BUN: 24 mg/dL (ref 8–27)
CO2: 34 mmol/L — ABNORMAL HIGH (ref 18–29)
Calcium: 9.3 mg/dL (ref 8.7–10.3)
Chloride: 100 mmol/L (ref 97–108)
Creatinine, Ser: 1.03 mg/dL — ABNORMAL HIGH (ref 0.57–1.00)
GFR calc Af Amer: 65 mL/min/{1.73_m2} (ref >59)
GFR calc non Af Amer: 56 mL/min/{1.73_m2} — ABNORMAL LOW (ref >59)
Glucose: 96 mg/dL (ref 65–99)
Potassium: 4.4 mmol/L (ref 3.5–5.2)
Sodium: 145 mmol/L — ABNORMAL HIGH (ref 134–144)

## 2013-10-23 LAB — LIPID PANEL
Chol/HDL Ratio: 3.6 ratio units (ref 0.0–4.4)
Cholesterol, Total: 250 mg/dL — ABNORMAL HIGH (ref 100–199)
HDL: 69 mg/dL (ref >39)
LDL Calculated: 162 mg/dL — ABNORMAL HIGH (ref 0–99)
Triglycerides: 93 mg/dL (ref 0–149)
VLDL Cholesterol Cal: 19 mg/dL (ref 5–40)

## 2013-10-23 LAB — HEMOGLOBIN A1C
Est. average glucose Bld gHb Est-mCnc: 128 mg/dL
Hgb A1c MFr Bld: 6.1 % — ABNORMAL HIGH (ref 4.8–5.6)

## 2013-10-27 ENCOUNTER — Other Ambulatory Visit: Payer: Self-pay | Admitting: *Deleted

## 2013-10-27 MED ORDER — LOVASTATIN 40 MG PO TABS: 40.0000 mg | ORAL_TABLET | Freq: Every day | ORAL | Status: DC

## 2013-10-27 NOTE — Telephone Encounter (Signed)
Pt notified VIA phone regarding result/recommendations and medication sent to the pharmacy.

## 2013-11-12 ENCOUNTER — Other Ambulatory Visit: Payer: Self-pay | Admitting: Internal Medicine

## 2013-11-15 ENCOUNTER — Other Ambulatory Visit: Payer: Self-pay | Admitting: Internal Medicine

## 2013-11-17 ENCOUNTER — Other Ambulatory Visit: Payer: Self-pay | Admitting: *Deleted

## 2013-11-17 MED ORDER — HYDROCHLOROTHIAZIDE 25 MG PO TABS: ORAL_TABLET | ORAL | Status: DC

## 2013-11-17 MED ORDER — LOSARTAN POTASSIUM 100 MG PO TABS: ORAL_TABLET | ORAL | Status: DC

## 2013-11-17 NOTE — Telephone Encounter (Signed)
Patient requested Rx to be faxed to pharmacy.  

## 2013-12-06 ENCOUNTER — Encounter: Payer: Self-pay | Admitting: Internal Medicine

## 2013-12-06 ENCOUNTER — Telehealth: Payer: Self-pay | Admitting: Emergency Medicine

## 2013-12-06 ENCOUNTER — Ambulatory Visit (INDEPENDENT_AMBULATORY_CARE_PROVIDER_SITE_OTHER): Payer: Medicare Other | Admitting: Internal Medicine

## 2013-12-06 VITALS — BP 148/82 | HR 106 | Ht 62.5 in | Wt 192.8 lb

## 2013-12-06 DIAGNOSIS — Q279 Congenital malformation of peripheral vascular system, unspecified: Secondary | ICD-10-CM

## 2013-12-06 DIAGNOSIS — Q2730 Arteriovenous malformation, site unspecified: Secondary | ICD-10-CM

## 2013-12-06 DIAGNOSIS — J449 Chronic obstructive pulmonary disease, unspecified: Secondary | ICD-10-CM

## 2013-12-06 NOTE — Assessment & Plan Note (Signed)
#  Abdominal pain and AVM - she missed seeing GI. So,  meet our [facility] and get re-referred to [facility]

## 2013-12-06 NOTE — Progress Notes (Signed)
Subjective:    Patient ID: Herbert Pun, female    DOB: 05-05-1946, 68 y.o.   MRN: [id number]  HPI    1. Severe COPD -O2 depenedent (advair causes epistaxis)  chronic respiratory failure- MM phenotype Nov 2012  - Spirometry Nov 2012: Fev1 0.85L/45%, Ratio - CAT score 10 - May 2013  =- COPD based on age, spiormetry and smoking hx and emphysema on CT 2014  2. Recurrent AECOPD -     - Oct 2011, JAn 2012, March 2012 and August 2012, Sept 2013  - hospitalized may 2014  - PCP office Rx June 2014 - zpak and pred  - ? 01/08/13 - telephone Rx of zpak and pred from Dr Chase Caller  - 07/20/13: Had called into office ofPCP with chest congestion, cough  - and she reported going to urgent care. Pharmacy has no records of Rx  3. Small but increasing pericardial effusion on CT jan and march 2012. Moderate Rt atrial collapse in August 2012 - Dr Acie Fredrickson since Aug 2012  - Improved small pericardial effusion 11/08/2012  4. Isolated social situation.   5. Ex smoker   6.hx of snoring and excess daytime somnolence but not using cpap; no dx of OSA.   7. Body mass index is 33.98 kg/(m^2). - on 08/01/2011  - Body mass index is 34.03 kg/(m^2). on 08/16/2013    8. lung cancer screening  - CT angiogram chest done for pulmonary embolism 11/08/2012: No evidence of lung cancer - Low-dose CT scan of the chest to be done after June 2015    OV 12/06/2013  Chief Complaint  Patient presents with  . Follow-up    Pt states her breathing is unchanged. Pt states she has some days when she "feels like she cant't get enough air or wont be able to take another breath". Pt denies cough and CP.    COPD: currently stable. COPD symptom CAT score is 16. Compliant with mdi spiriva and symbicort and o2. HEat bothered her; almost went to ER fo rexacerbation but stelled spontaneosuly. Advised her to call us next time. She watns samples. STill interested in research trials but does not meet criteria due to < 2 aecopd past  year  Past: having abd pain near umbilcus. Missed her AVM followup with [facility] due to AECOPD admit. PCP apparently told her is visceral obesity but she feels is much more.    CAT SCORE 11/04/11 02/28/2012  11/16/2012 Post hospitalization for COPD flare  12/06/2013   Never Cough -> Cough all the time 0 3 0 1  No phlegm in chest -> Chest is full of phlegm 0 2 2 0  No chest tightness -> Chest feels very tight 3 1 2  0  No dyspnea for 1 flight stairs/hill -> Very dyspneic for 1 flight of stairs 5 4 5 5   No limitations for ADL at home -> Very limited with ADL at home 0 2 5 4   Confident leaving home -> Not at all confident leaving home 0 1 3 0  Sleep soundly -> Do not sleep soundly because of lung condition 0 3 3 3   Lots of Energy -> No energy at all 2 3 1 3   TOTAL Score (max 40)  10 19 21 16     Past, Family, Social reviewed: no change since last visit except July 2014: 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  HENT: Negative for  congestion, dental problem, ear pain, nosebleeds, postnasal drip, rhinorrhea, sinus pressure, sneezing, sore throat and trouble swallowing.   Eyes: Negative for redness and itching.  Respiratory: Positive for shortness of breath. Negative for cough, chest tightness and wheezing.   Cardiovascular: Positive for leg swelling. Negative for palpitations.  Gastrointestinal: Negative for nausea and vomiting.  Genitourinary: Negative for dysuria.  Musculoskeletal: Negative for joint swelling.  Skin: Negative for rash.  Neurological: Negative for headaches.  Hematological: Does not bruise/bleed easily.  Psychiatric/Behavioral: Negative for dysphoric mood. The patient is not nervous/anxious.        Objective:   Physical Exam  Filed Vitals:   12/06/13 1332  BP: 148/82  Pulse: 106  Height: 5' 2.5" (1.588 m)  Weight: 192 lb 12.8 oz (87.454 kg)  SpO2: 96%    itals  reviewed. Constitutional: She is oriented to person, place, and time. She appears well-developed and well-nourished. No distress.  Chronically looking  Female Has walker with oxygen on  HENT:  Head: Normocephalic and atraumatic.  Right Ear: External ear normal.  Left Ear: External ear normal.  Mouth/Throat: Oropharynx is clear and moist. No oropharyngeal exudate.  Eyes: Conjunctivae and EOM are normal. Pupils are equal, round, and reactive to light. Right eye exhibits no discharge. Left eye exhibits no discharge. No scleral icterus.  Neck: Normal range of motion. Neck supple. No JVD present. No tracheal deviation present. No thyromegaly present.  Cardiovascular: Normal rate, regular rhythm, normal heart sounds and intact distal pulses.  Exam reveals no gallop and no friction rub.   No murmur heard. Pulmonary/Chest: Effort normal and breath sounds normal. No respiratory distress. She has no wheezes. She has no rales. She exhibits no tenderness.  Abdominal: Soft. Bowel sounds are normal. She exhibits no distension and no mass. There is no tenderness. There is no rebound and no guarding.  Musculoskeletal: Normal range of motion. She exhibits no edema and no tenderness.  Lymphadenopathy:    She has no cervical adenopathy.  Neurological: She is alert and oriented to person, place, and time. She has normal reflexes. No cranial nerve deficit. She exhibits normal muscle tone. Coordination normal.  Skin: Skin is warm and dry. No rash noted. She is not diaphoretic. No erythema. No pallor.  Psychiatric: She has a normal mood and affect. Her behavior is normal. Judgment and thought content normal.       Assessment & Plan:  #COPD - Glad COPD is stable  - Continue Spiriva 1 puff daily -  Symbicort 80/4.5, 2 puff 2 times daily; - Continue oxygen as before - STart N-acetyl cysteine 600mg  twice daily to prevent copd flare up (you can get it at [facility]) - try mucinex as needed -  will have research  coordinator contact you about COPD trials   #Abdominal pain and AVM  - meet our [facility] and get re-referred to [facility]   #Followup - 3 months with COPD cat score - Flu shot in the fall - can call us anytime [phone] for copd issues

## 2013-12-06 NOTE — Patient Instructions (Addendum)
#  COPD - Glad COPD is stable  - Continue Spiriva 1 puff daily -  Symbicort 80/4.5, 2 puff 2 times daily; - Continue oxygen as before - STart N-acetyl cysteine 600mg  twice daily to prevent copd flare up (you can get it at [facility]) - try mucinex as needed -  will have research coordinator contact you about COPD trials   #Abdominal pain and AVM  - meet our [facility] and get re-referred to [facility]   #Followup - 3 months with COPD cat score - Flu shot in the fall - can call us anytime [phone] for copd issues

## 2013-12-06 NOTE — Assessment & Plan Note (Signed)
#  COPD - Glad COPD is stable  - Continue Spiriva 1 puff daily -  Symbicort 80/4.5, 2 puff 2 times daily; - Continue oxygen as before - STart N-acetyl cysteine 600mg  twice daily to prevent copd flare up (you can get it at [facility]) - try mucinex as needed -  will have research coordinator contact you about COPD trials  #Followup - 3 months with COPD cat score - Flu shot in the fall - can call us anytime [phone] for copd issues

## 2013-12-06 NOTE — Telephone Encounter (Signed)
Called and spoke to pt's pharmacy, [facility] [address], [place]. Pharmacy tech stated [name] had a pred taper and zpak both prescribed on 01/08/2013 by MR, nothing has been prescribed in 2015 regarding a COPD exacerbation.

## 2013-12-12 ENCOUNTER — Telehealth: Payer: Self-pay | Admitting: Internal Medicine

## 2013-12-12 DIAGNOSIS — Q2730 Arteriovenous malformation, site unspecified: Secondary | ICD-10-CM | POA: Insufficient documentation

## 2013-12-12 NOTE — Telephone Encounter (Signed)
Let her know that at this point she does not qualify for copd research trials but we will continue to monitor her and when she qualifies will let her know promptly

## 2013-12-12 NOTE — Assessment & Plan Note (Signed)
 #  Abdominal pain and AVM  - meet our [facility] and get re-referred to [facility]

## 2013-12-14 NOTE — Telephone Encounter (Signed)
lmtcb

## 2013-12-15 NOTE — Telephone Encounter (Signed)
Pt returned call. Informed pt of MR's recs. Pt verbalized understanding and denied any further questions or concerns at this time.

## 2014-01-21 ENCOUNTER — Ambulatory Visit (INDEPENDENT_AMBULATORY_CARE_PROVIDER_SITE_OTHER): Payer: Medicare Other | Admitting: Internal Medicine

## 2014-01-21 ENCOUNTER — Encounter: Payer: Self-pay | Admitting: Internal Medicine

## 2014-01-21 VITALS — BP 154/100 | HR 96 | Temp 98.3°F | Resp 20 | Ht 62.5 in | Wt 189.2 lb

## 2014-01-21 DIAGNOSIS — E669 Obesity, unspecified: Secondary | ICD-10-CM

## 2014-01-21 DIAGNOSIS — E118 Type 2 diabetes mellitus with unspecified complications: Secondary | ICD-10-CM

## 2014-01-21 DIAGNOSIS — E1165 Type 2 diabetes mellitus with hyperglycemia: Secondary | ICD-10-CM

## 2014-01-21 DIAGNOSIS — J449 Chronic obstructive pulmonary disease, unspecified: Secondary | ICD-10-CM

## 2014-01-21 DIAGNOSIS — E782 Mixed hyperlipidemia: Secondary | ICD-10-CM

## 2014-01-21 DIAGNOSIS — D509 Iron deficiency anemia, unspecified: Secondary | ICD-10-CM

## 2014-01-21 DIAGNOSIS — IMO0002 Reserved for concepts with insufficient information to code with codable children: Secondary | ICD-10-CM

## 2014-01-21 DIAGNOSIS — J4489 Other specified chronic obstructive pulmonary disease: Secondary | ICD-10-CM

## 2014-01-21 DIAGNOSIS — M109 Gout, unspecified: Secondary | ICD-10-CM

## 2014-01-21 DIAGNOSIS — F411 Generalized anxiety disorder: Secondary | ICD-10-CM

## 2014-01-21 DIAGNOSIS — I10 Essential (primary) hypertension: Secondary | ICD-10-CM

## 2014-01-21 MED ORDER — HYDRALAZINE HCL 25 MG PO TABS
25.0000 mg | ORAL_TABLET | Freq: Three times a day (TID) | ORAL | Status: DC
Start: 2014-01-21 — End: 2014-07-07

## 2014-01-21 MED ORDER — SERTRALINE HCL 50 MG PO TABS: 50.0000 mg | ORAL_TABLET | Freq: Every day | ORAL | Status: DC

## 2014-01-21 MED ORDER — COLCHICINE 0.6 MG PO TABS: 0.6000 mg | ORAL_TABLET | ORAL | Status: DC | PRN

## 2014-01-21 MED ORDER — ALBUTEROL SULFATE HFA 108 (90 BASE) MCG/ACT IN AERS: 2.0000 | INHALATION_SPRAY | RESPIRATORY_TRACT | Status: DC | PRN

## 2014-01-21 NOTE — Progress Notes (Signed)
Patient ID: [name], female   DOB: [birth year], 68 y.o.   MRN: [id number]   Location:  [facility]  Code Status: full code, given living will info   Allergies  Allergen Reactions  . Lisinopril Swelling    Angioedema   . Advair Diskus [Fluticasone-Salmeterol] Other (See Comments)    Nose bleeds.   . Penicillins     hives    Chief Complaint  Patient presents with  . Medical Management of Chronic Issues    HPI: Patient is a 68 y.o. black female seen in the office today for medical mgt of chronic diseases.  Says her sister has been sick so she's been riding back and forth to [place] visiting her.    Has questions about losartan.  Asked why she is on it when she is black.  Reviewed that she is also on hydralazine.    Going home to visit family next week and going to celebrate her sister's bday.    No complaints medically today.  Stable with her copd.  Feels good on the days when it's not too hot or humid.  Missed her am meds b/c she was hurrying to get here so bp high.  Review of Systems:  Review of Systems  Constitutional: Negative for fever, chills, weight loss and malaise/fatigue.  HENT: Negative for congestion.   Eyes: Negative for blurred vision.       Glasses  Respiratory: Positive for cough, sputum production and shortness of breath.        Unchanged  Cardiovascular: Negative for chest pain and leg swelling.  Gastrointestinal: Negative for abdominal pain, diarrhea, constipation, blood in stool and melena.  Genitourinary: Negative for dysuria, urgency and frequency.  Musculoskeletal: Negative for falls and myalgias.  Skin: Negative for rash.  Neurological: Negative for dizziness, loss of consciousness and weakness.  Psychiatric/Behavioral: Negative for depression and memory loss.     Past Medical History  Diagnosis Date  . Asthma   . COPD (chronic obstructive pulmonary disease)   . Hypertension   . Diabetes mellitus  09/13/2010    glucophage  . Gout   . Oxygen dependent     pt uses O2   . Pericardial effusion 08/2011    small residual on echo  . Hyperlipidemia   . Type II or unspecified type diabetes mellitus with neurological manifestations, uncontrolled   . Personal history of noncompliance with medical treatment, presenting hazards to health   . Unspecified disease of pericardium   . Hemorrhage of rectum and anus   . Panic disorder without agoraphobia   . Depressive disorder, not elsewhere classified   . Edema   . Anxiety state, unspecified   . Unspecified hereditary and idiopathic peripheral neuropathy   . Type II or unspecified type diabetes mellitus without mention of complication, not stated as uncontrolled   . Other and unspecified hyperlipidemia   . Obesity, unspecified   . Unspecified tinnitus   . Unspecified essential hypertension   . Pneumonia, organism unspecified   . Chronic obstructive asthma, unspecified   . Acute and chronic respiratory failure   . Urinary frequency   . Urgency of urination   . Anemia due to chronic blood loss 01/04/2013    Past Surgical History  Procedure Laterality Date  . Breast biopsy      both breasts  . Total abdominal hysterectomy    . Esophagogastroduodenoscopy N/A 01/03/2013    Procedure: ESOPHAGOGASTRODUODENOSCOPY (EGD);  Surgeon: Inda Castle,  MD;  Location: [facility];  Service: Endoscopy;  Laterality: N/A;  . Colonoscopy N/A 01/05/2013    Procedure: COLONOSCOPY;  Surgeon: Gatha Mayer, [place];  Location: [facility];  Service: Endoscopy;  Laterality: N/A;    Social History:   reports that she quit smoking about 9 years ago. Her smoking use included Cigarettes. She has a 30 pack-year smoking history. She has never used smokeless tobacco. She reports that she drinks about one ounce of alcohol per week. She reports that she does not use illicit drugs.  Family History  Problem Relation Age of Onset  . COPD Mother   . COPD Sister   .  Hypertension Sister   . Cancer Father   . Asthma Father   . Diabetes Other     3 maternal aunts  . Prostate cancer Paternal Uncle     Medications: Patient's Medications  New Prescriptions   No medications on file  Previous Medications   ALBUTEROL (PROAIR HFA) 108 (90 BASE) MCG/ACT INHALER    Inhale 2 puffs into the lungs every 4 (four) hours as needed for shortness of breath.   ALBUTEROL (PROVENTIL) (2.5 MG/3ML) 0.083% NEBULIZER SOLUTION    Take 2.5 mg by nebulization every 6 (six) hours as needed for shortness of breath.    BUDESONIDE-FORMOTEROL (SYMBICORT) 80-4.5 MCG/ACT INHALER    Inhale 2 puffs into the lungs 2 (two) times daily.   COLCHICINE 0.6 MG TABLET    Take 0.6 mg by mouth as needed.   DEXTROMETHORPHAN-GUAIFENESIN (MUCINEX DM) 30-600 MG TB12    Take 1 tablet by mouth 2 (two) times daily as needed.   FERROUS SULFATE (IRON) 325 (65 FE) MG TABS    Take 1 tablet by mouth daily.     HYDRALAZINE (APRESOLINE) 25 MG TABLET    Take 1 tablet (25 mg total) by mouth every 8 (eight) hours.   HYDROCHLOROTHIAZIDE (HYDRODIURIL) 25 MG TABLET    Take one tablet by mouth once daily for blood pressure   LOSARTAN (COZAAR) 100 MG TABLET    Take one tablet by mouth once daily to control blood pressure   LOVASTATIN (MEVACOR) 40 MG TABLET    Take 1 tablet (40 mg total) by mouth at bedtime. For hyperlipedemia   METFORMIN (GLUCOPHAGE) 1000 MG TABLET    Take 1 tablet (1,000 mg total) by mouth 2 (two) times daily with a meal.   PANTOPRAZOLE (PROTONIX) 40 MG TABLET    Take 40 mg by mouth as needed.   SERTRALINE (ZOLOFT) 50 MG TABLET    Take 1 tablet (50 mg total) by mouth at bedtime.   TIOTROPIUM (SPIRIVA) 18 MCG INHALATION CAPSULE    Place 1 capsule (18 mcg total) into inhaler and inhale daily.  Modified Medications   No medications on file  Discontinued Medications   No medications on file     Physical Exam: Filed Vitals:   01/21/14 1040  BP: 154/100  Pulse: 96  Temp: 98.3 F (36.8 C)    TempSrc: Oral  Resp: 20  Height: 5' 2.5" (1.588 m)  Weight: 189 lb 3.2 oz (85.821 kg)  SpO2: 96%  Physical Exam  Constitutional: She is oriented to person, place, and time. She appears well-developed and well-nourished. No distress.  Black female ambulates with rollator walker primarily to hold her oxygen  HENT:  Head: Normocephalic and atraumatic.  Cardiovascular: Normal rate, regular rhythm, normal heart sounds and intact distal pulses.   Pulmonary/Chest: Effort normal. No respiratory distress.  Prolonged expiratory phase  Abdominal: Soft. Bowel sounds are normal. She exhibits no distension and no mass. There is no tenderness.  Musculoskeletal: Normal range of motion. She exhibits no edema and no tenderness.  Neurological: She is alert and oriented to person, place, and time.  Skin: Skin is warm and dry.  Psychiatric: She has a normal mood and affect.     Labs reviewed: Basic Metabolic Panel:  Recent Labs  04/15/13 1046 07/15/13 1218 10/22/13 1136  NA 143 144 145*  K 3.9 4.0 4.4  CL 97 98 100  CO2 32* 32* 34*  GLUCOSE 104* 88 96  BUN 20 17 24   CREATININE 1.08* 1.06* 1.03*  CALCIUM 9.7 9.6 9.3   Liver Function Tests:  Recent Labs  04/15/13 1046 07/15/13 1218  AST 12 14  ALT 13 13  ALKPHOS 68 64  BILITOT 0.2 0.3  PROT 6.4 6.7   No results found for this basename: LIPASE, AMYLASE,  in the last 8760 hours No results found for this basename: AMMONIA,  in the last 8760 hours CBC:  Recent Labs  04/15/13 1046 07/15/13 1218 10/22/13 1136  WBC 5.2 5.6 4.1  NEUTROABS 3.3 3.4 2.1  HGB 11.8 11.8 11.7  HCT 37.4 37.0 37.2  MCV 85 89 90  PLT  --   --  242   Lipid Panel:  Recent Labs  04/15/13 1046 07/15/13 1218 10/22/13 1136  HDL 80 79 69  LDLCALC 165* 127* 162*  TRIG 73 71 93  CHOLHDL 3.3 2.8 3.6   Lab Results  Component Value Date   HGBA1C 6.1* 10/22/2013  Assessment/Plan 1. COPD (chronic obstructive pulmonary disease) with chronic  bronchitis -has been stable lately--she's actually had all of her medications b/c she is getting them through discount and free programs with the pharmaceutical companies -did refill her albuterol inhaler today  2. Type II or unspecified type diabetes mellitus with unspecified complication, uncontrolled - has been under fair control lately but she is not very adherent with diet and cannot do much exercise due to her advanced COPD - Comprehensive metabolic panel - Hemoglobin A1c  3. Obesity - does not do much exercise to lose weight  -encouraged her to eat healthy diet  - Comprehensive metabolic panel  4. Mixed hyperlipidemia - cont mevacor 40mg  and see where her LDL and TG have gone--she has a good HDL - Lipid panel  5. Iron deficiency anemia - CBC With differential/Platelet -continues on iron daily  6. Essential hypertension, benign -bp NOT at goal today b/c she did not get the chance to take her meds before coming in this am--plans to take as soon as she can eat and drink after her labs  7. Acute gout of foot, unspecified cause, unspecified laterality - had a recent flare which has resolved now -refuses daily allopurinol or uloric to lower her uric acid levels and prevent flares -requests refill of prn colchicine for anticipated flare-up with trip to [place] to eat seafood - colchicine 0.6 MG tablet; Take 1 tablet (0.6 mg total) by mouth as needed.  Dispense: 30 tablet; Refill: 3  8. Anxiety state, unspecified -has been quite stable with low dose zoloft--refilled today -does have more stress with travel to visit her sister who has not been healthy - sertraline (ZOLOFT) 50 MG tablet; Take 1 tablet (50 mg total) by mouth at bedtime.  Dispense: 30 tablet; Refill: 3  Labs/tests ordered:   Orders Placed This Encounter  Procedures  . CBC With differential/Platelet  . Comprehensive metabolic panel  .  Hemoglobin A1c  . Lipid panel    Next appt:  3 mos

## 2014-01-22 LAB — CBC WITH DIFFERENTIAL
Basophils Absolute: 0 10*3/uL (ref 0.0–0.2)
Basos: 0 %
Eos: 3 %
Eosinophils Absolute: 0.2 10*3/uL (ref 0.0–0.4)
HCT: 35.7 % (ref 34.0–46.6)
Hemoglobin: 11.3 g/dL (ref 11.1–15.9)
Immature Grans (Abs): 0 10*3/uL (ref 0.0–0.1)
Immature Granulocytes: 0 %
Lymphocytes Absolute: 1.5 10*3/uL (ref 0.7–3.1)
Lymphs: 31 %
MCH: 27.8 pg (ref 26.6–33.0)
MCHC: 31.7 g/dL (ref 31.5–35.7)
MCV: 88 fL (ref 79–97)
Monocytes Absolute: 0.4 10*3/uL (ref 0.1–0.9)
Monocytes: 9 %
Neutrophils Absolute: 2.8 10*3/uL (ref 1.4–7.0)
Neutrophils Relative %: 57 %
Platelets: 284 10*3/uL (ref 150–379)
RBC: 4.07 x10E6/uL (ref 3.77–5.28)
RDW: 14.9 % (ref 12.3–15.4)
WBC: 4.9 10*3/uL (ref 3.4–10.8)

## 2014-01-22 LAB — COMPREHENSIVE METABOLIC PANEL
ALT: 13 IU/L (ref 0–32)
AST: 12 IU/L (ref 0–40)
Albumin/Globulin Ratio: 1.6 (ref 1.1–2.5)
Albumin: 4.1 g/dL (ref 3.6–4.8)
Alkaline Phosphatase: 72 IU/L (ref 39–117)
BUN/Creatinine Ratio: 17 (ref 11–26)
BUN: 19 mg/dL (ref 8–27)
CO2: 32 mmol/L — ABNORMAL HIGH (ref 18–29)
Calcium: 9.5 mg/dL (ref 8.7–10.3)
Chloride: 98 mmol/L (ref 97–108)
Creatinine, Ser: 1.15 mg/dL — ABNORMAL HIGH (ref 0.57–1.00)
GFR calc Af Amer: 56 mL/min/{1.73_m2} — ABNORMAL LOW (ref >59)
GFR calc non Af Amer: 49 mL/min/{1.73_m2} — ABNORMAL LOW (ref >59)
Globulin, Total: 2.6 g/dL (ref 1.5–4.5)
Glucose: 109 mg/dL — ABNORMAL HIGH (ref 65–99)
Potassium: 3.9 mmol/L (ref 3.5–5.2)
Sodium: 144 mmol/L (ref 134–144)
Total Bilirubin: 0.3 mg/dL (ref 0.0–1.2)
Total Protein: 6.7 g/dL (ref 6.0–8.5)

## 2014-01-22 LAB — LIPID PANEL
Chol/HDL Ratio: 3.6 ratio units (ref 0.0–4.4)
Cholesterol, Total: 258 mg/dL — ABNORMAL HIGH (ref 100–199)
HDL: 71 mg/dL (ref >39)
LDL Calculated: 155 mg/dL — ABNORMAL HIGH (ref 0–99)
Triglycerides: 159 mg/dL — ABNORMAL HIGH (ref 0–149)
VLDL Cholesterol Cal: 32 mg/dL (ref 5–40)

## 2014-01-22 LAB — HEMOGLOBIN A1C
Est. average glucose Bld gHb Est-mCnc: 137 mg/dL
Hgb A1c MFr Bld: 6.4 % — ABNORMAL HIGH (ref 4.8–5.6)

## 2014-02-02 ENCOUNTER — Telehealth: Payer: Self-pay | Admitting: Internal Medicine

## 2014-02-02 NOTE — Telephone Encounter (Signed)
Added patient to see MW in am as she is feeling tired, not breathing as well, and due to go out of town tomorrow.

## 2014-02-03 ENCOUNTER — Encounter: Payer: Self-pay | Admitting: Internal Medicine

## 2014-02-03 ENCOUNTER — Ambulatory Visit (INDEPENDENT_AMBULATORY_CARE_PROVIDER_SITE_OTHER)
Admission: RE | Admit: 2014-02-03 | Discharge: 2014-02-03 | Disposition: A | Discharge status: Home / Self Care | Payer: Medicare Other | Source: Ambulatory Visit | Attending: Internal Medicine | Admitting: Internal Medicine

## 2014-02-03 ENCOUNTER — Ambulatory Visit (INDEPENDENT_AMBULATORY_CARE_PROVIDER_SITE_OTHER): Payer: Medicare Other | Admitting: Internal Medicine

## 2014-02-03 VITALS — BP 150/98 | HR 99 | Temp 98.4°F | Ht 62.5 in | Wt 191.6 lb

## 2014-02-03 DIAGNOSIS — J4489 Other specified chronic obstructive pulmonary disease: Secondary | ICD-10-CM

## 2014-02-03 DIAGNOSIS — J449 Chronic obstructive pulmonary disease, unspecified: Secondary | ICD-10-CM

## 2014-02-03 MED ORDER — PREDNISONE 10 MG PO TABS: ORAL_TABLET | ORAL | Status: DC

## 2014-02-03 NOTE — Patient Instructions (Signed)
Work on inhaler technique:  relax and gently blow all the way out then take a nice smooth deep breath back in, triggering the inhaler at same time you start breathing in.  Hold for up to 5 seconds if you can.  Rinse and gargle with water when done  Please remember to go to the  x-ray department downstairs for your tests - we will call you with the results when they are available.  Prednisone 10 mg take  4 each am x 2 days,   2 each am x 2 days,  1 each am x 2 days and stop   Keep appt to see Dr Chase Caller

## 2014-02-03 NOTE — Progress Notes (Signed)
Subjective:    Patient ID: [name], female    DOB: Dec 26, 1945, 68 y.o.   MRN: [id number]  HPI    1. Severe COPD -O2 depenedent (advair causes epistaxis)  chronic respiratory failure- MM phenotype Nov 2012  - Spirometry Nov 2012: Fev1 0.85L/45%, Ratio - CAT score 10 - May 2013  =- COPD based on age, spiormetry and smoking hx and emphysema on CT 2014  2. Recurrent AECOPD -     - Oct 2011, JAn 2012, March 2012 and August 2012, Sept 2013  - hospitalized may 2014  - PCP office Rx June 2014 - zpak and pred  - ? 01/08/13 - telephone Rx of zpak and pred from Dr Chase Caller  - 07/20/13: Had called into office ofPCP with chest congestion, cough  - and she reported going to urgent care. Pharmacy has no records of Rx  3. Small but increasing pericardial effusion on CT jan and march 2012. Moderate Rt atrial collapse in August 2012 - Dr Acie Fredrickson since Aug 2012  - Improved small pericardial effusion 11/08/2012  4. Isolated social situation.   5. Ex smoker   6.hx of snoring and excess daytime somnolence but not using cpap; no dx of OSA.   7. Body mass index is 33.98 kg/(m^2). - on 08/01/2011  - Body mass index is 34.03 kg/(m^2). on 08/16/2013    8. lung cancer screening  - CT angiogram chest done for pulmonary embolism 11/08/2012: No evidence of lung cancer - Low-dose CT scan of the chest to be done after June 2015    OV 12/06/2013  Chief Complaint  Patient presents with  . Follow-up    Pt states her breathing is unchanged. Pt states she has some days when she "feels like she cant't get enough air or wont be able to take another breath". Pt denies cough and CP.    COPD: currently stable. COPD symptom CAT score is 16. Compliant with mdi spiriva and symbicort and o2. HEat bothered her; almost went to ER fo rexacerbation but stelled spontaneosuly. Advised her to call us next time. She watns samples. STill interested in research trials but does not meet criteria due to < 2 aecopd past  year  Past: having abd pain near umbilcus. Missed her AVM followup with [facility] due to AECOPD admit. PCP apparently told her is visceral obesity but she feels is much more.  rec    02/03/2014 f/u ov/[name] re: copd/02 dep Chief Complaint  Patient presents with  . acute office visit    Pt c/o increased SOB and fatigue. Pt states that her energy levels are low.   sitting still ok Lying down sob x 2 day Doe x 10 days   When doing well doesn't need a albuterol, since got worse started using saba outside, then started neb x 48 and helps some    No obvious day to day or daytime variabilty or assoc flare of cough or cp or chest tightness, subjective wheeze overt sinus or hb symptoms. No unusual exp hx or h/o childhood pna/ asthma or knowledge of premature birth.  Sleeping ok without nocturnal  or early am exacerbation  of respiratory  c/o's or need for noct saba. Also denies any obvious fluctuation of symptoms with weather or environmental changes or other aggravating or alleviating factors except as outlined above   Current Medications, Allergies, Complete Past Medical History, Past Surgical History, Family History, and Social History were reviewed in [employer] record.  ROS  The following are not active complaints unless bolded sore throat, dysphagia, dental problems, itching, sneezing,  nasal congestion or excess/ purulent secretions, ear ache,   fever, chills, sweats, unintended wt loss, pleuritic or exertional cp, hemoptysis,  orthopnea pnd or leg swelling, presyncope, palpitations, heartburn, abdominal pain, anorexia, nausea, vomiting, diarrhea  or change in bowel or urinary habits, change in stools or urine, dysuria,hematuria,  rash, arthralgias, visual complaints, headache, numbness weakness or ataxia or problems with walking or coordination,  change in mood/affect or memory.                  Objective:   Physical Exam  Wt Readings from Last 3  Encounters:  02/03/14 191 lb 9.6 oz (86.909 kg)  01/21/14 189 lb 3.2 oz (85.821 kg)  12/06/13 192 lb 12.8 oz (87.454 kg)      using symbicort x 2 2.5 h and no saba/ spiriva each pm    HEENT mild turbinate edema.  Oropharynx no thrush or excess pnd or cobblestoning.  No JVD or cervical adenopathy. Mild accessory muscle hypertrophy. Trachea midline, nl thryroid. Chest was hyperinflated by percussion with diminished breath sounds and moderate increased exp time without wheeze. Hoover sign positive at mid inspiration. Regular rate and rhythm without murmur gallop or rub or increase P2 or edema.  Abd: no hsm, nl excursion. Ext warm without cyanosis or clubbing.      CXR  02/03/2014 : Underlying emphysema. No edema or consolidation. Stable cardiomegaly. Mild aortic tortuosity may reflect chronic hypertensive change and appears stable.    Assessment & Plan:

## 2014-02-04 ENCOUNTER — Telehealth: Payer: Self-pay | Admitting: Internal Medicine

## 2014-02-04 NOTE — Assessment & Plan Note (Signed)
DDX of  difficult airways management all start with A and  include Adherence, Ace Inhibitors, Acid Reflux, Active Sinus Disease, Alpha 1 Antitripsin deficiency, Anxiety masquerading as Airways dz,  ABPA,  allergy(esp in young), Aspiration (esp in elderly), Adverse effects of DPI,  Active smokers, plus two Bs  = Bronchiectasis and Beta blocker use..and one C= CHF  Adherence is always the initial "prime suspect" and is a multilayered concern that requires a "trust but verify" approach in every patient - starting with knowing how to use medications, especially inhalers, correctly, keeping up with refills and understanding the fundamental difference between maintenance and prns vs those medications only taken for a very short course and then stopped and not refilled.  The proper method of use, as well as anticipated side effects, of a metered-dose inhaler are discussed and demonstrated to the patient. Improved effectiveness after extensive coaching during this visit to a level of approximately  75%   ? Astham/allergy > Prednisone 10 mg take  4 each am x 2 days,   2 each am x 2 days,  1 each am x 2 days and stop  ? Acid (or non-acid) GERD > always difficult to exclude as up to 75% of pts in some series report no assoc GI/ Heartburn symptoms> rec continue max (24h)  acid suppression and diet restrictions/ reviewed     See instructions for specific recommendations which were reviewed directly with the patient who was given a copy with highlighter outlining the key components.

## 2014-02-07 NOTE — Telephone Encounter (Signed)
Spoke with pt and notified of results per Dr. Wert. Pt verbalized understanding and denied any questions. 

## 2014-02-07 NOTE — Progress Notes (Signed)
Quick Note:  Spoke with pt and notified of results per Dr. Wert. Pt verbalized understanding and denied any questions.  ______ 

## 2014-02-08 ENCOUNTER — Other Ambulatory Visit: Payer: Self-pay | Admitting: *Deleted

## 2014-02-08 DIAGNOSIS — E119 Type 2 diabetes mellitus without complications: Secondary | ICD-10-CM

## 2014-02-16 ENCOUNTER — Encounter: Payer: Self-pay | Admitting: Internal Medicine

## 2014-02-16 ENCOUNTER — Ambulatory Visit (INDEPENDENT_AMBULATORY_CARE_PROVIDER_SITE_OTHER): Payer: Medicare Other | Admitting: Internal Medicine

## 2014-02-16 ENCOUNTER — Ambulatory Visit (INDEPENDENT_AMBULATORY_CARE_PROVIDER_SITE_OTHER)
Admission: RE | Admit: 2014-02-16 | Discharge: 2014-02-16 | Disposition: A | Discharge status: Home / Self Care | Payer: Medicare Other | Source: Ambulatory Visit | Attending: Internal Medicine | Admitting: Internal Medicine

## 2014-02-16 VITALS — BP 174/86 | HR 88 | Ht 62.5 in | Wt 191.0 lb

## 2014-02-16 DIAGNOSIS — R142 Eructation: Secondary | ICD-10-CM

## 2014-02-16 DIAGNOSIS — R143 Flatulence: Secondary | ICD-10-CM

## 2014-02-16 DIAGNOSIS — R140 Abdominal distension (gaseous): Secondary | ICD-10-CM

## 2014-02-16 DIAGNOSIS — Q2733 Arteriovenous malformation of digestive system vessel: Secondary | ICD-10-CM

## 2014-02-16 DIAGNOSIS — D500 Iron deficiency anemia secondary to blood loss (chronic): Secondary | ICD-10-CM

## 2014-02-16 DIAGNOSIS — R141 Gas pain: Secondary | ICD-10-CM

## 2014-02-16 DIAGNOSIS — K5520 Angiodysplasia of colon without hemorrhage: Secondary | ICD-10-CM

## 2014-02-16 DIAGNOSIS — K219 Gastro-esophageal reflux disease without esophagitis: Secondary | ICD-10-CM

## 2014-02-16 DIAGNOSIS — Z86010 Personal history of colonic polyps: Secondary | ICD-10-CM

## 2014-02-16 DIAGNOSIS — K31819 Angiodysplasia of stomach and duodenum without bleeding: Secondary | ICD-10-CM

## 2014-02-16 MED ORDER — PANTOPRAZOLE SODIUM 40 MG PO TBEC: 40.0000 mg | DELAYED_RELEASE_TABLET | Freq: Every day | ORAL | Status: DC

## 2014-02-16 MED ORDER — RIFAXIMIN 550 MG PO TABS: 550.0000 mg | ORAL_TABLET | Freq: Three times a day (TID) | ORAL | Status: DC

## 2014-02-16 NOTE — Patient Instructions (Signed)
We will send in Protonix and Xifaxian to your pharmacy  Your appointment with [name] is scheduled on 03/15/2014 at 2pm Make sure your Multivitamin has iron in it  We have given you a gas and flatulence diet handout  Go to the basement for your xray today

## 2014-02-17 ENCOUNTER — Encounter: Payer: Self-pay | Admitting: Internal Medicine

## 2014-02-17 DIAGNOSIS — K219 Gastro-esophageal reflux disease without esophagitis: Secondary | ICD-10-CM | POA: Insufficient documentation

## 2014-02-17 NOTE — Progress Notes (Signed)
Subjective:    Patient ID: [name], female    DOB: 03-11-1946, 68 y.o.   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 She is here alone today. She reports overall she has been feeling well but has good and bad days with her breathing and COPD. She remains on continuous oxygen.  She reports upper abdominal bloating which is worse after eating. This is described as a tightness. She's had no nausea or vomiting. Her appetite has been good. Some heartburn but this had been completely relieved by pantoprazole, though this medicine was dropped for unclear reason. No dysphagia or odynophagia. Bowel limits have been regular and light in color. No melena or rectal bleeding. She was previously taking iron but has been out of it for several months. She did recently buy a multivitamin and feels like this has iron in it.  She was hospitalized with melena and acute on chronic anemia in July 2014. Upper endoscopy and colonoscopy were performed and she had 1 gastric angiodysplastic lesion ablated and 6 right colonic angiodysplastic lesions ablated.  Review of Systems As per history of present illness, otherwise negative  Current Medications, Allergies, Past Medical History, Past Surgical History, Family History and Social History were reviewed in [employer] record.     Objective:   Physical Exam BP 174/86  Pulse 88  Ht 5' 2.5" (1.588 m)  Wt 191 lb (86.637 kg)  BMI 34.36 kg/m2 Constitutional: Well-developed and well-nourished, chronically ill-appearing. No distress. HEENT: Normocephalic and atraumatic. Oropharynx is clear and moist. No oropharyngeal exudate. Conjunctivae are normal.  No  scleral icterus. Option by nasal cannula in place Cardiovascular: Normal rate, regular rhythm and intact distal pulses. No M/R/G Pulmonary/chest: Mild increased effort with distant breath sounds. End expiratory wheezing, no rales or rhonchi. Abdominal: Soft, obese, nontender, nondistended. Bowel sounds active throughout. Extremities: no clubbing, cyanosis, or edema Neurological: Alert and oriented to person place and time. Psychiatric: Normal mood and affect. Behavior is normal.  CBC    Component Value Date/Time   WBC 4.9 01/21/2014 1127   WBC 8.4 01/05/2013 0440   RBC 4.07 01/21/2014 1127   RBC 3.31* 01/05/2013 0440   RBC 3.75* 11/07/2012 0340   HGB 11.3 01/21/2014 1127   HCT 35.7 01/21/2014 1127   PLT 284 01/21/2014 1127   MCV 88 01/21/2014 1127   MCH 27.8 01/21/2014 1127   MCH 27.8 01/05/2013 0440   MCHC 31.7 01/21/2014 1127   MCHC 31.6 01/05/2013 0440   RDW 14.9 01/21/2014 1127   RDW 17.3* 01/05/2013 0440   LYMPHSABS 1.5 01/21/2014 1127   LYMPHSABS 1.1 01/01/2013 1723   MONOABS 0.5 01/01/2013 1723   EOSABS 0.2 01/21/2014 1127   EOSABS 0.1 01/01/2013 1723   BASOSABS 0.0 01/21/2014 1127   BASOSABS 0.0 01/01/2013 1723    CMP     Component Value Date/Time   NA 144 01/21/2014 1127   NA 141 01/02/2013 0550   K 3.9 01/21/2014 1127   CL 98 01/21/2014 1127   CO2 32* 01/21/2014 1127   GLUCOSE 109* 01/21/2014 1127   GLUCOSE 104* 01/02/2013 0550   BUN 19 01/21/2014 1127   BUN 12 01/02/2013 0550   CREATININE 1.15* 01/21/2014 1127   CALCIUM 9.5 01/21/2014 1127  PROT 6.7 01/21/2014 1127   PROT 6.8 11/09/2012 0425   ALBUMIN 3.1* 11/09/2012 0425   AST 12 01/21/2014 1127   ALT 13 01/21/2014 1127   ALKPHOS 72 01/21/2014 1127   BILITOT 0.3 01/21/2014 1127   GFRNONAA 49* 01/21/2014 1127   GFRAA 56* 01/21/2014 1127   EGD/Colonoscopy July 2014 - gastric AVM ablated, cecal and ascending colon AVMs ablated.  Sigmoid diverticulosis.  Otherwise normal in TI and throughout the colon.  No polyps. Excellent prep.   CT ABDOMEN AND PELVIS WITH  CONTRAST -- FEB 2013   Technique:  Multidetector CT imaging of the abdomen and pelvis was performed following the standard protocol during bolus administration of intravenous contrast.   Contrast: 114mL OMNIPAQUE IOHEXOL 300 MG/ML IV SOLN   Comparison: Chest CTA dated 01/17/2011.   Findings: No significant change in a moderately large pericardial effusion, measuring 3.6 cm in maximum thickness.  Adjacent mass- like consolidation of the right middle lobe appears smaller and more smoothly marginated.   Multiple small liver cysts.  Small ventral hernia above the level of the umbilicus, at the level of the inferior edge of the lateral segment of the left lobe of the liver, in the midline.  This contains fluid and a small amount of fat.  This is at the superior aspect of a possible small midline surgical scar just above the level of the umbilicus.   8 mm oval area of low density in the lateral aspect of the spleen. This is only faintly visualized and not definitely seen previously. Unremarkable pancreas, gallbladder, adrenal glands, kidneys and urinary bladder.  Surgically absent uterus.  Normal left ovary. The right ovary is not identified.   No gastrointestinal abnormalities or enlarged lymph nodes.  The appendix is not definitely identified.  No evidence of appendicitis.  Lumbar spine degenerative changes.   IMPRESSION:   1.  Stable moderately large pericardial effusion. 2.  Decreased size of a mass-like area of consolidation in the medial right middle lobe, with smooth margins. 3.  Small liver cysts and probable small splenic cyst or hemangioma. 4.  Small ventral hernia containing fluid and a small amount of fat.   Original Report Authenticated By: Gerald Stabs, M.D.  ABD 2v today -- normal bowel gas pattern, no evidence of ileus or obstruction    Assessment & Plan:   68 year old female with severe COPD on continuous oxygen, history of adenomatous colon polyps,  history of gastric and right colonic angiodysplastic lesions status post ablation in July 2014, history of iron deficiency anemia on oral iron, hypertension, hyperlipidemia who is seen for followup.  1.  Abd bloating -- no alarm symptoms though she is bothered by postprandial bloating. Abdominal x-ray unremarkable today. I'm restarting her PPI for reflux, see #2. This may help her abdominal symptoms as well. I will treat her for bacterial overgrowth empirically with rifaximin 550 mg 3 times daily x10 days. Have recommended a low bloating diet. We will see her back in 4 weeks with advanced practitioner for followup to see if symptoms improve. Probiotic could be considered at next visit  2. GERD -- no alarm symptoms, though she is having some heartburn. Resume pantoprazole 40 mg daily  3.  History of gastric and colonic angiodysplasia/IDA --  No evidence for recurrent GI bleeding. Fortunately her anemia has resolved and she is normocytic. She has even stopped iron replacement. Primary care can follow iron studies and resume oral iron if necessary. No indication for repeat endoscopy at present. Monitor  hemoglobin regularly with routine primary care  4.  History of adenomatous colon polyps -- no polyps on colonoscopy in July 2014. Repeat screening recommended July 2019 based on overall health at that time.

## 2014-03-08 ENCOUNTER — Other Ambulatory Visit: Payer: Self-pay | Admitting: Internal Medicine

## 2014-03-08 ENCOUNTER — Other Ambulatory Visit: Payer: Self-pay | Admitting: *Deleted

## 2014-03-08 MED ORDER — HYDROCHLOROTHIAZIDE 25 MG PO TABS: ORAL_TABLET | ORAL | Status: DC

## 2014-03-08 MED ORDER — LOSARTAN POTASSIUM 100 MG PO TABS: ORAL_TABLET | ORAL | Status: DC

## 2014-03-08 NOTE — Telephone Encounter (Signed)
Patient Requested to be faxed to pharmacy. 

## 2014-03-09 ENCOUNTER — Other Ambulatory Visit: Payer: Self-pay | Admitting: *Deleted

## 2014-03-09 DIAGNOSIS — J449 Chronic obstructive pulmonary disease, unspecified: Secondary | ICD-10-CM

## 2014-03-09 MED ORDER — BUDESONIDE-FORMOTEROL FUMARATE 80-4.5 MCG/ACT IN AERO: 2.0000 | INHALATION_SPRAY | Freq: Two times a day (BID) | RESPIRATORY_TRACT | Status: DC

## 2014-03-09 NOTE — Telephone Encounter (Signed)
Patient called and requested Rx to be faxed to [facility] for Patient Assistance for Symbicort. Fax to # [phone]  With Patient account # [id number]

## 2014-03-15 ENCOUNTER — Telehealth: Payer: Self-pay | Admitting: *Deleted

## 2014-03-15 ENCOUNTER — Ambulatory Visit (INDEPENDENT_AMBULATORY_CARE_PROVIDER_SITE_OTHER): Payer: Medicare Other | Admitting: Physician Assistant

## 2014-03-15 ENCOUNTER — Telehealth: Payer: Self-pay | Admitting: Internal Medicine

## 2014-03-15 ENCOUNTER — Encounter: Payer: Self-pay | Admitting: Physician Assistant

## 2014-03-15 VITALS — BP 146/84 | HR 96 | Ht 62.5 in | Wt 184.0 lb

## 2014-03-15 DIAGNOSIS — R141 Gas pain: Secondary | ICD-10-CM

## 2014-03-15 DIAGNOSIS — R143 Flatulence: Secondary | ICD-10-CM

## 2014-03-15 DIAGNOSIS — R142 Eructation: Secondary | ICD-10-CM

## 2014-03-15 DIAGNOSIS — R140 Abdominal distension (gaseous): Secondary | ICD-10-CM

## 2014-03-15 NOTE — Patient Instructions (Signed)
We will fax your prescription information to [facility] for the Xifaxan medication. They will work with [employer] and let us know about the approval for the medication .  We will call you once we know about the cost.  They will mail this prescription to your home. Take Gas X with each meal and Beano also. Call our office for a follow up in 6- weeks with Dr. Hilarie Fredrickson.

## 2014-03-15 NOTE — Progress Notes (Addendum)
Subjective:    Patient ID: [name], female    DOB: [birth year], 68 y.o.   MRN: [id number]  HPI  [name] is a pleasant 68 year old African American female known to Dr. Hilarie Fredrickson who comes back in for followup of abdominal bloating and gas. She was seen in the office about a month ago and at that time restarted PPI therapy in the form of Protonix. She was also given a prescription for Xifaxan for potential bacterial overgrowth and a low gas diet. She has been taking the pantoprazole and says she's not having any heartburn or indigestion type symptoms and her appetite has been fair. She has continued with the upper abdominal bloating and tightness type of sensation she says she feels full and sometimes has discomfort on in her sides of her abdomen. She's not having any nausea or vomiting. Her weight has been stable her bowel movements have been normal for her with occasional episodes of sporadic diarrhea. She has not really been following a low gas diet because she says everything on that list her foods that she regularly. She does not drink carbonated beverages. She was unable to get the Xifaxan as her insurance doesn't cover it. Patient's does get short of breath very easily with walking or movement but does not feel short of breath with meals. She's not aware of swallowing air. She says if she gets excited or distressed she does have very rapid pursed lipped breathing and feels she may in just some air during those times.    Review of Systems  Constitutional: Negative.   HENT: Negative.   Eyes: Negative.   Respiratory: Positive for shortness of breath.   Cardiovascular: Negative.   Gastrointestinal: Positive for diarrhea and abdominal distention.  Endocrine: Negative.   Genitourinary: Negative.   Musculoskeletal: Positive for gait problem.  Skin: Negative.   Allergic/Immunologic: Negative.   Neurological: Negative.   Hematological: Negative.   Psychiatric/Behavioral: Negative.      Outpatient Prescriptions Prior to Visit  Medication Sig Dispense Refill  . albuterol (PROAIR HFA) 108 (90 BASE) MCG/ACT inhaler Inhale 2 puffs into the lungs every 4 (four) hours as needed for shortness of breath.  1 Inhaler  5  . albuterol (PROVENTIL) (2.5 MG/3ML) 0.083% nebulizer solution Take 2.5 mg by nebulization every 6 (six) hours as needed for shortness of breath.       . budesonide-formoterol (SYMBICORT) 80-4.5 MCG/ACT inhaler Inhale 2 puffs into the lungs 2 (two) times daily.  4 Inhaler  3  . colchicine 0.6 MG tablet Take 1 tablet (0.6 mg total) by mouth as needed.  30 tablet  3  . Dextromethorphan-Guaifenesin (MUCINEX DM) 30-600 MG TB12 Take 1 tablet by mouth 2 (two) times daily as needed.      . Ferrous Sulfate (IRON) 325 (65 FE) MG TABS Take 1 tablet by mouth daily.        . hydrALAZINE (APRESOLINE) 25 MG tablet Take 1 tablet (25 mg total) by mouth every 8 (eight) hours.  90 tablet  3  . hydrochlorothiazide (HYDRODIURIL) 25 MG tablet Take one tablet by mouth once daily for blood pressure  90 tablet  1  . losartan (COZAAR) 100 MG tablet TAKE ONE TABLET BY MOUTH ONCE DAILY  90 tablet  1  . lovastatin (MEVACOR) 40 MG tablet Take 1 tablet (40 mg total) by mouth at bedtime. For hyperlipedemia  30 tablet  3  . metFORMIN (GLUCOPHAGE) 1000 MG tablet Take 1 tablet (1,000 mg total) by mouth 2 (two)  times daily with a meal.  180 tablet  1  . pantoprazole (PROTONIX) 40 MG tablet Take 1 tablet (40 mg total) by mouth daily.  30 tablet  3  . predniSONE (DELTASONE) 10 MG tablet Take  4 each am x 2 days,   2 each am x 2 days,  1 each am x 2 days and stop  14 tablet  0  . rifaximin (XIFAXAN) 550 MG TABS tablet Take 1 tablet (550 mg total) by mouth 3 (three) times daily.  30 tablet  0  . sertraline (ZOLOFT) 50 MG tablet Take 1 tablet (50 mg total) by mouth at bedtime.  30 tablet  3  . tiotropium (SPIRIVA) 18 MCG inhalation capsule Place 1 capsule (18 mcg total) into inhaler and inhale daily.  30  capsule  3  . hydrochlorothiazide (HYDRODIURIL) 25 MG tablet TAKE ONE TABLET BY MOUTH ONCE DAILY  90 tablet  1  . losartan (COZAAR) 100 MG tablet Take one tablet by mouth once daily to control blood pressure  90 tablet  1  . pantoprazole (PROTONIX) 40 MG tablet Take 40 mg by mouth as needed.       No facility-administered medications prior to visit.   Allergies  Allergen Reactions  . Lisinopril Swelling    Angioedema   . Advair Diskus [Fluticasone-Salmeterol] Other (See Comments)    Nose bleeds.   . Penicillins     hives   Patient Active Problem List   Diagnosis Date Noted  . Gastroesophageal reflux disease without esophagitis 02/17/2014  . AVM (arteriovenous malformation) 12/12/2013  . COPD, severe 12/06/2013  . Anxiety state, unspecified 10/22/2013  . Gout 01/06/2013  . Cecal angiodysplasia 01/04/2013  . Anemia due to chronic blood loss 01/04/2013  . Gastric AVM 01/03/2013  . AVM (arteriovenous malformation) of duodenum, acquired 01/03/2013  . Acute posthemorrhagic anemia 01/02/2013  . GI bleed 01/01/2013  . Anemia 01/01/2013  . Hypertension   . COPD exacerbation 11/06/2012  . Chest pain 11/06/2012  . Iron deficiency anemia 10/18/2012  . Type II or unspecified type diabetes mellitus with unspecified complication, uncontrolled 09/24/2012  . Insomnia 09/24/2012  . Essential hypertension, benign 09/24/2012  . Obesity 11/04/2011  . History of colon polyps 09/09/2011  . Mixed hyperlipidemia 02/14/2011  . COPD, frequent exacerbations 11/09/2010  . Pericardial effusion 07/04/2010   History  Substance Use Topics  . Smoking status: Former Smoker -- 1.00 packs/day for 30 years    Types: Cigarettes    Quit date: 06/17/2004  . Smokeless tobacco: Never Used  . Alcohol Use: 1.0 oz/week    2 drink(s) per week     Comment: none iin a month   family history includes Asthma in her father; COPD in her mother and sister; Cancer in her father; Diabetes in her other; Hypertension in  her sister; Prostate cancer in her paternal uncle.     Objective:   Physical Exam well-developed older African American female in no acute distress, she ambulates with a walker and is on continuous oxygen blood pressure 146/84 pulse 96 height 5 foot 2 weight 184. HEENT; nontraumatic normocephalic EOMI PERRLA sclera anicteric, Supple; no JVD, Cardiovascular; regular rate and rhythm with S1-S2 no murmur or gallop, Pulmonary; decreased breath sounds bilaterally, Abdomen ;soft ,not distended, bowel sounds are present no palpable mass or hepatosplenomegaly and no focal tenderness, Rectal; exam not done, Extremities ;no clubbing cyanosis or edema skin warm dry, Psych ;mood and affect appropriate        Assessment &  Plan:  #88 68 year old female with severe COPD O2 dependent, and diabetic with complaints of abdominal bloating. No improvement on PPI therapy, she has not taken his Xifaxan for possible bacterial overgrowth so this is still a possibility. I also wonder if she may have a component of aerophagia with her severe COPD. Review of CT scan from 2013 nausea associated a small ventral hernia. #2 GERD-continue pantoprazole 40 mg by mouth daily #3 history of right colon  angioectasisas #4 history of adenomatous colon polyps  Plan; continue proton pantoprazole 40 mg by mouth every morning Patient encouraged to attempt low gas diet , and eliminate a few  of the high gas offenders Add been no with each meal Add Gas-X one with each meal Will try to get pre authorization  for a course of Xifaxan for small bowel bacterial overgrowth Followup in 1 month with Dr. Hilarie Fredrickson or myself -if symptoms are persisting may want to consider imaging.  Addendum: Reviewed and agree with management. Jerene Bears, MD

## 2014-03-15 NOTE — Telephone Encounter (Signed)
Okay, can try metronidazole 250 mg TID x 10 days for empiric SIBO treatment Have her let me know if it helps with gas/bloating, abd symptoms

## 2014-03-15 NOTE — Telephone Encounter (Signed)
Per [facility], [employer] will not cover Xifaxan until patient has tried an antispasmotic, Flagyl, Cipro or SSRI. Please advise.Marland KitchenMarland Kitchen

## 2014-03-15 NOTE — Telephone Encounter (Signed)
I gave 1 sample of symbicort 80 and reminded her to keep Oct ov with MR  Pt verbalized understanding and nothing further needed

## 2014-03-16 NOTE — Telephone Encounter (Signed)
Left message for patient to call back  

## 2014-03-17 NOTE — Telephone Encounter (Signed)
Left message for patient to call back  

## 2014-03-18 NOTE — Telephone Encounter (Signed)
Patient returned call and left a message with our schedulers to call her back.

## 2014-03-18 NOTE — Telephone Encounter (Signed)
Left a message for patient to call back and ask for [name] if calls back before 5 and if not then to call on Monday and ask for [name].

## 2014-03-21 MED ORDER — METRONIDAZOLE 250 MG PO TABS: 250.0000 mg | ORAL_TABLET | Freq: Three times a day (TID) | ORAL | Status: DC

## 2014-03-21 NOTE — Telephone Encounter (Signed)
I have spoken to patient to give Dr Vena Rua recommendations. New rx for metronidazole has been sent to pharmacy and patient verbalizes understanding. I have also asked her to call back with an update on how the medication works for her.

## 2014-03-28 ENCOUNTER — Ambulatory Visit: Payer: Medicare Other

## 2014-03-28 DIAGNOSIS — Z23 Encounter for immunization: Secondary | ICD-10-CM

## 2014-03-29 ENCOUNTER — Other Ambulatory Visit: Payer: Self-pay | Admitting: *Deleted

## 2014-03-29 DIAGNOSIS — J449 Chronic obstructive pulmonary disease, unspecified: Secondary | ICD-10-CM

## 2014-03-29 DIAGNOSIS — J4489 Other specified chronic obstructive pulmonary disease: Secondary | ICD-10-CM

## 2014-03-29 MED ORDER — BUDESONIDE-FORMOTEROL FUMARATE 80-4.5 MCG/ACT IN AERO: 2.0000 | INHALATION_SPRAY | Freq: Two times a day (BID) | RESPIRATORY_TRACT | Status: DC

## 2014-03-29 NOTE — Telephone Encounter (Signed)
Patient requested to be printed and faxed to [facility]. Fax [phone]  Patient ID# [id number]

## 2014-03-30 ENCOUNTER — Ambulatory Visit (INDEPENDENT_AMBULATORY_CARE_PROVIDER_SITE_OTHER): Payer: Medicare Other | Admitting: Internal Medicine

## 2014-03-30 ENCOUNTER — Encounter: Payer: Self-pay | Admitting: Internal Medicine

## 2014-03-30 VITALS — BP 146/88 | HR 93 | Ht 62.5 in | Wt 183.0 lb

## 2014-03-30 DIAGNOSIS — Z23 Encounter for immunization: Secondary | ICD-10-CM

## 2014-03-30 DIAGNOSIS — J449 Chronic obstructive pulmonary disease, unspecified: Secondary | ICD-10-CM

## 2014-03-30 DIAGNOSIS — J9611 Chronic respiratory failure with hypoxia: Secondary | ICD-10-CM

## 2014-03-30 NOTE — Progress Notes (Signed)
Subjective:    Patient ID: [name], female    DOB: 09/09/1945, 68 y.o.   MRN: [id number]  HPI      1. Severe COPD -O2 depenedent (advair causes epistaxis)  chronic respiratory failure- MM phenotype Nov 2012  - Spirometry Nov 2012: Fev1 0.85L/45%, Ratio - CAT score 10 - May 2013  =- COPD based on age, spiormetry and smoking hx and emphysema on CT 2014  2. Recurrent AECOPD - (baseline eos 200-400 putting her at risk for recurrent  aecopd)  - Oct 2011, JAn 2012, March 2012 and August 2012, Sept 2013  - hospitalized may 2014  - PCP office Rx June 2014 - zpak and pred  - ? 01/08/13 - telephone Rx of zpak and pred from Dr Chase Caller  - 07/20/13: Had called into office ofPCP with chest congestion, cough  - and she reported going to urgent care. Pharmacy has no records of Rx  - 8/.20/15 - OFfice urgent visit - AECOPD RX with prednisone by Dr Melvyn Novas  3. Small but increasing pericardial effusion on CT jan and march 2012. Moderate Rt atrial collapse in August 2012 - Dr Acie Fredrickson since Aug 2012  - Improved small pericardial effusion 11/08/2012  4. Isolated social situation.   5. Ex smoker   6.hx of snoring and excess daytime somnolence but not using cpap; no dx of OSA.   7. Body mass index is 33.98 kg/(m^2). - on 08/01/2011  - Body mass index is 34.03 kg/(m^2). on 08/16/2013 - Body mass index is 32.92 kg/(m^2). on 03/30/2014     8. lung cancer screening  - CT angiogram chest done for pulmonary embolism 11/08/2012: No evidence of lung cancer - CXR aug 2015  - clear - Low-dose CT scan of the chest to be done after June 2015     OV 03/30/2014   Fu for above  LAst seen by me in June 2015. After that saw Dr Melvyn Novas urgently 02/03/14 and in aecopd and given pred taper. CXR was clear. She is better now and at baseline chronic poor health. Compliant with spiriva and symbicort and o2; wants samples. Had flu shot yesterday. Will have PREVNAR 03/30/2014. COPD CAT Score is 27 and reflects  significant symptm burdern documented below. SHe continues to be very interested in research trials   CAT COPD Symptom & Quality of Life Score ([place]) 0 is no burden. 5 is highest burden 03/30/2014   Never Cough -> Cough all the time 1  No phlegm in chest -> Chest is full of phlegm 3  No chest tightness -> Chest feels very tight 4  No dyspnea for 1 flight stairs/hill -> Very dyspneic for 1 flight of stairs 5  No limitations for ADL at home -> Very limited with ADL at home 5  Confident leaving home -> Not at all confident leaving home 1  Sleep soundly -> Do not sleep soundly because of lung condition 3  Lots of Energy -> No energy at all 5  TOTAL Score (max 40)  27       Immunization History  Administered Date(s) Administered  . Influenza Split 02/28/2012  . Influenza Whole 03/17/2010, 03/31/2011  . Influenza,inj,Quad PF,36+ Mos 03/16/2013, 03/28/2014  . Pneumococcal Conjugate-13 08/16/2013  . Zoster 03/17/2010     Review of Systems  Constitutional: Negative for fever and unexpected weight change.  HENT: Negative for congestion, dental problem, ear pain, nosebleeds, postnasal drip, rhinorrhea, sinus pressure, sneezing, sore throat and trouble swallowing.   Eyes:  Negative for redness and itching.  Respiratory: Positive for chest tightness and shortness of breath. Negative for cough and wheezing.   Cardiovascular: Negative for palpitations and leg swelling.  Gastrointestinal: Negative for nausea and vomiting.  Genitourinary: Negative for dysuria.  Musculoskeletal: Negative for joint swelling.  Skin: Negative for rash.  Neurological: Negative for headaches.  Hematological: Does not bruise/bleed easily.  Psychiatric/Behavioral: Negative for dysphoric mood. The patient is not nervous/anxious.    Current outpatient prescriptions:albuterol (PROAIR HFA) 108 (90 BASE) MCG/ACT inhaler, Inhale 2 puffs into the lungs every 4 (four) hours as needed for shortness of breath.,  Disp: 1 Inhaler, Rfl: 5;  albuterol (PROVENTIL) (2.5 MG/3ML) 0.083% nebulizer solution, Take 2.5 mg by nebulization every 6 (six) hours as needed for shortness of breath. , Disp: , Rfl:  colchicine 0.6 MG tablet, Take 1 tablet (0.6 mg total) by mouth as needed., Disp: 30 tablet, Rfl: 3;  Dextromethorphan-Guaifenesin (MUCINEX DM) 30-600 MG TB12, Take 1 tablet by mouth 2 (two) times daily as needed., Disp: , Rfl: ;  hydrALAZINE (APRESOLINE) 25 MG tablet, Take 1 tablet (25 mg total) by mouth every 8 (eight) hours., Disp: 90 tablet, Rfl: 3 hydrochlorothiazide (HYDRODIURIL) 25 MG tablet, Take one tablet by mouth once daily for blood pressure, Disp: 90 tablet, Rfl: 1;  losartan (COZAAR) 100 MG tablet, TAKE ONE TABLET BY MOUTH ONCE DAILY, Disp: 90 tablet, Rfl: 1;  lovastatin (MEVACOR) 40 MG tablet, Take 1 tablet (40 mg total) by mouth at bedtime. For hyperlipedemia, Disp: 30 tablet, Rfl: 3 metFORMIN (GLUCOPHAGE) 1000 MG tablet, Take 1 tablet (1,000 mg total) by mouth 2 (two) times daily with a meal., Disp: 180 tablet, Rfl: 1;  pantoprazole (PROTONIX) 40 MG tablet, Take 1 tablet (40 mg total) by mouth daily., Disp: 30 tablet, Rfl: 3;  sertraline (ZOLOFT) 50 MG tablet, Take 1 tablet (50 mg total) by mouth at bedtime., Disp: 30 tablet, Rfl: 3 tiotropium (SPIRIVA) 18 MCG inhalation capsule, Place 1 capsule (18 mcg total) into inhaler and inhale daily., Disp: 30 capsule, Rfl: 3;  budesonide-formoterol (SYMBICORT) 80-4.5 MCG/ACT inhaler, Inhale 2 puffs into the lungs 2 (two) times daily., Disp: 4 Inhaler, Rfl: 3;  Ferrous Sulfate (IRON) 325 (65 FE) MG TABS, Take 1 tablet by mouth daily.  , Disp: , Rfl:  metroNIDAZOLE (FLAGYL) 250 MG tablet, Take 1 tablet (250 mg total) by mouth 3 (three) times daily., Disp: 30 tablet, Rfl: 0;  rifaximin (XIFAXAN) 550 MG TABS tablet, Take 1 tablet (550 mg total) by mouth 3 (three) times daily., Disp: 30 tablet, Rfl: 0;  [DISCONTINUED] albuterol-ipratropium (COMBIVENT) 18-103 MCG/ACT inhaler,  Inhale 2 puffs into the lungs every 6 (six) hours as needed. For shortness of breath., Disp: , Rfl:      Objective:   Physical Exam  Filed Vitals:   03/30/14 1005  BP: 146/88  Pulse: 93  Height: 5' 2.5" (1.588 m)  Weight: 183 lb (83.008 kg)  SpO2: 96%    itals reviewed. Constitutional: She is oriented to person, place, and time. She appears well-developed and well-nourished. No distress.  Chronically ill looking  Female - looks baseline Has walker with oxygen on  HENT:  Head: Normocephalic and atraumatic.  Right Ear: External ear normal.  Left Ear: External ear normal.  Mouth/Throat: Oropharynx is clear and moist. No oropharyngeal exudate.  Eyes: Conjunctivae and EOM are normal. Pupils are equal, round, and reactive to light. Right eye exhibits no discharge. Left eye exhibits no discharge. No scleral icterus.  Neck: Normal range of  motion. Neck supple. No JVD present. No tracheal deviation present. No thyromegaly present.  Cardiovascular: Normal rate, regular rhythm, normal heart sounds and intact distal pulses.  Exam reveals no gallop and no friction rub.   No murmur heard. Pulmonary/Chest: Effort normal and breath sounds normal. No respiratory distress. She has no wheezes. She has no rales. She exhibits no tenderness.  Abdominal: Soft. Bowel sounds are normal. She exhibits no distension and no mass. There is no tenderness. There is no rebound and no guarding.  Musculoskeletal: Normal range of motion. She exhibits no edema and no tenderness.  Lymphadenopathy:    She has no cervical adenopathy.  Neurological: She is alert and oriented to person, place, and time. She has normal reflexes. No cranial nerve deficit. She exhibits normal muscle tone. Coordination normal.  Skin: Skin is warm and dry. No rash noted. She is not diaphoretic. No erythema. No pallor.  Psychiatric: She has a normal mood and affect. Her behavior is normal. Judgment and thought content normal.          Assessment & Plan:  #COPD - Glad COPD is stable  - Continue Spiriva 1 puff daily -  Symbicort 80/4.5, 2 puff 2 times daily; - Continue oxygen as before - try mucinex as needed -  will have research coordinator contact you NOW  about COPD trials - glad you had flu shot  Yesterday - PREVNAR vaccine 03/30/2014   #Followup - 3 months with COPD cat score with DR Ramsawamy - Flu shot in the fall - can call us anytime [phone] for copd issues       Dr. Brand Males, M.D., [facility]C.P Pulmonary and Critical Care Medicine Staff Physician [facility] Pager: [phone], If no answer or between  15:00h - 7:00h: call [phone]  03/30/2014 10:33 AM

## 2014-03-30 NOTE — Patient Instructions (Addendum)
#  COPD - Glad COPD is stable  - Continue Spiriva 1 puff daily -  Symbicort 80/4.5, 2 puff 2 times daily; - Continue oxygen as before - try mucinex as needed -  will have research coordinator contact you NOW  about COPD trials - glad you had flu shot  Yesterday - PREVNAR vaccine 03/30/2014   #Followup - 3 months with COPD cat score with DR Ramsawamy - Flu shot in the fall - can call us anytime [phone] for copd issues

## 2014-03-30 NOTE — Addendum Note (Signed)
Addended by: Maurice March on: 03/30/2014 05:33 PM   Modules accepted: Orders

## 2014-04-05 ENCOUNTER — Other Ambulatory Visit: Payer: Self-pay

## 2014-04-05 DIAGNOSIS — J449 Chronic obstructive pulmonary disease, unspecified: Secondary | ICD-10-CM

## 2014-04-05 MED ORDER — BUDESONIDE-FORMOTEROL FUMARATE 80-4.5 MCG/ACT IN AERO: 2.0000 | INHALATION_SPRAY | Freq: Two times a day (BID) | RESPIRATORY_TRACT | Status: DC

## 2014-04-05 NOTE — Telephone Encounter (Signed)
Patient called to discuss back and forth concerns about Symbicort. Patient was approved for patient assistance, the company is unable to process refill for symbicort due to 2 rx's sent over with 2 different doses. Patient would like for [place] to re-submit rx and make note to VOID all previous received rx's. RX was sent to [phone].

## 2014-04-14 ENCOUNTER — Ambulatory Visit: Payer: Medicare Other | Admitting: Internal Medicine

## 2014-04-14 ENCOUNTER — Telehealth: Payer: Self-pay | Admitting: Internal Medicine

## 2014-04-14 MED ORDER — PROMETHAZINE-CODEINE 6.25-10 MG/5ML PO SYRP: 5.0000 mL | ORAL_SOLUTION | Freq: Four times a day (QID) | ORAL | Status: DC | PRN

## 2014-04-14 NOTE — Telephone Encounter (Signed)
Called and spoke to pt. Pt c/o  hacky cough with intermittent mucus production-clear in color x 1 week. Pt states she is having difficulty sleeping d/t cough. Pt also c/o slight increase in SOB with activity. Pt stated she has stopped taking her symbicort since the cough started because she did not want it to make the cough worse. Pt has been taking symbicort for years. Pt states [place] throat lozenges help with the cough. Pt denies CP/tightness and f/c/s. Pt is requesting recs today. MR is not in office-will send to doc of day.  CY please advise.   Allergies  Allergen Reactions  . Lisinopril Swelling    Angioedema   . Advair Diskus [Fluticasone-Salmeterol] Other (See Comments)    Nose bleeds.   . Penicillins     hives   Current Outpatient Prescriptions on File Prior to Visit  Medication Sig Dispense Refill  . albuterol (PROAIR HFA) 108 (90 BASE) MCG/ACT inhaler Inhale 2 puffs into the lungs every 4 (four) hours as needed for shortness of breath.  1 Inhaler  5  . albuterol (PROVENTIL) (2.5 MG/3ML) 0.083% nebulizer solution Take 2.5 mg by nebulization every 6 (six) hours as needed for shortness of breath.       . budesonide-formoterol (SYMBICORT) 80-4.5 MCG/ACT inhaler Inhale 2 puffs into the lungs 2 (two) times daily.  4 Inhaler  11  . colchicine 0.6 MG tablet Take 1 tablet (0.6 mg total) by mouth as needed.  30 tablet  3  . Dextromethorphan-Guaifenesin (MUCINEX DM) 30-600 MG TB12 Take 1 tablet by mouth 2 (two) times daily as needed.      . Ferrous Sulfate (IRON) 325 (65 FE) MG TABS Take 1 tablet by mouth daily.        . hydrALAZINE (APRESOLINE) 25 MG tablet Take 1 tablet (25 mg total) by mouth every 8 (eight) hours.  90 tablet  3  . hydrochlorothiazide (HYDRODIURIL) 25 MG tablet Take one tablet by mouth once daily for blood pressure  90 tablet  1  . losartan (COZAAR) 100 MG tablet TAKE ONE TABLET BY MOUTH ONCE DAILY  90 tablet  1  . lovastatin (MEVACOR) 40 MG tablet Take 1 tablet (40 mg  total) by mouth at bedtime. For hyperlipedemia  30 tablet  3  . metFORMIN (GLUCOPHAGE) 1000 MG tablet Take 1 tablet (1,000 mg total) by mouth 2 (two) times daily with a meal.  180 tablet  1  . metroNIDAZOLE (FLAGYL) 250 MG tablet Take 1 tablet (250 mg total) by mouth 3 (three) times daily.  30 tablet  0  . pantoprazole (PROTONIX) 40 MG tablet Take 1 tablet (40 mg total) by mouth daily.  30 tablet  3  . rifaximin (XIFAXAN) 550 MG TABS tablet Take 1 tablet (550 mg total) by mouth 3 (three) times daily.  30 tablet  0  . sertraline (ZOLOFT) 50 MG tablet Take 1 tablet (50 mg total) by mouth at bedtime.  30 tablet  3  . tiotropium (SPIRIVA) 18 MCG inhalation capsule Place 1 capsule (18 mcg total) into inhaler and inhale daily.  30 capsule  3  . [DISCONTINUED] albuterol-ipratropium (COMBIVENT) 18-103 MCG/ACT inhaler Inhale 2 puffs into the lungs every 6 (six) hours as needed. For shortness of breath.       No current facility-administered medications on file prior to visit.

## 2014-04-14 NOTE — Telephone Encounter (Signed)
Offer prometh codeine, 200 ml,  5 ml every 6 hours if needed, to control cough so she can get back on Symbicort

## 2014-04-14 NOTE — Telephone Encounter (Signed)
Called and spoke with pt and she is aware of CY recs.  This medication has been called to her pharmacy and nothing further is needed.

## 2014-04-15 ENCOUNTER — Other Ambulatory Visit: Payer: Self-pay | Admitting: Internal Medicine

## 2014-04-18 ENCOUNTER — Encounter (HOSPITAL_COMMUNITY): Payer: Self-pay | Admitting: *Deleted

## 2014-04-18 ENCOUNTER — Encounter: Payer: Self-pay | Admitting: Adult Health

## 2014-04-18 ENCOUNTER — Inpatient Hospital Stay (HOSPITAL_COMMUNITY)
Admission: AD | Admit: 2014-04-18 | Discharge: 2014-04-21 | DRG: 189 | Disposition: A | Discharge status: Home / Self Care | Payer: Medicare Other | Source: Ambulatory Visit | Attending: Pulmonary Disease | Admitting: Pulmonary Disease

## 2014-04-18 ENCOUNTER — Inpatient Hospital Stay (HOSPITAL_COMMUNITY): Payer: Medicare Other

## 2014-04-18 ENCOUNTER — Ambulatory Visit (INDEPENDENT_AMBULATORY_CARE_PROVIDER_SITE_OTHER): Payer: Medicare Other | Admitting: Adult Health

## 2014-04-18 ENCOUNTER — Telehealth: Payer: Self-pay | Admitting: Internal Medicine

## 2014-04-18 VITALS — BP 128/84 | HR 118 | Temp 98.7°F

## 2014-04-18 DIAGNOSIS — I5033 Acute on chronic diastolic (congestive) heart failure: Secondary | ICD-10-CM | POA: Diagnosis present

## 2014-04-18 DIAGNOSIS — N189 Chronic kidney disease, unspecified: Secondary | ICD-10-CM | POA: Diagnosis present

## 2014-04-18 DIAGNOSIS — J9622 Acute and chronic respiratory failure with hypercapnia: Secondary | ICD-10-CM | POA: Diagnosis present

## 2014-04-18 DIAGNOSIS — J449 Chronic obstructive pulmonary disease, unspecified: Secondary | ICD-10-CM

## 2014-04-18 DIAGNOSIS — K219 Gastro-esophageal reflux disease without esophagitis: Secondary | ICD-10-CM | POA: Diagnosis present

## 2014-04-18 DIAGNOSIS — E785 Hyperlipidemia, unspecified: Secondary | ICD-10-CM | POA: Diagnosis present

## 2014-04-18 DIAGNOSIS — G9340 Encephalopathy, unspecified: Secondary | ICD-10-CM | POA: Diagnosis present

## 2014-04-18 DIAGNOSIS — I509 Heart failure, unspecified: Secondary | ICD-10-CM

## 2014-04-18 DIAGNOSIS — Z9981 Dependence on supplemental oxygen: Secondary | ICD-10-CM

## 2014-04-18 DIAGNOSIS — J45909 Unspecified asthma, uncomplicated: Secondary | ICD-10-CM | POA: Diagnosis present

## 2014-04-18 DIAGNOSIS — Z87891 Personal history of nicotine dependence: Secondary | ICD-10-CM

## 2014-04-18 DIAGNOSIS — F329 Major depressive disorder, single episode, unspecified: Secondary | ICD-10-CM | POA: Diagnosis present

## 2014-04-18 DIAGNOSIS — F419 Anxiety disorder, unspecified: Secondary | ICD-10-CM | POA: Diagnosis present

## 2014-04-18 DIAGNOSIS — J9621 Acute and chronic respiratory failure with hypoxia: Secondary | ICD-10-CM | POA: Diagnosis present

## 2014-04-18 DIAGNOSIS — Z79899 Other long term (current) drug therapy: Secondary | ICD-10-CM | POA: Diagnosis not present

## 2014-04-18 DIAGNOSIS — R0602 Shortness of breath: Secondary | ICD-10-CM | POA: Diagnosis present

## 2014-04-18 DIAGNOSIS — D649 Anemia, unspecified: Secondary | ICD-10-CM | POA: Diagnosis present

## 2014-04-18 DIAGNOSIS — J441 Chronic obstructive pulmonary disease with (acute) exacerbation: Secondary | ICD-10-CM

## 2014-04-18 DIAGNOSIS — I129 Hypertensive chronic kidney disease with stage 1 through stage 4 chronic kidney disease, or unspecified chronic kidney disease: Secondary | ICD-10-CM | POA: Diagnosis present

## 2014-04-18 DIAGNOSIS — E1149 Type 2 diabetes mellitus with other diabetic neurological complication: Secondary | ICD-10-CM | POA: Diagnosis present

## 2014-04-18 DIAGNOSIS — E1122 Type 2 diabetes mellitus with diabetic chronic kidney disease: Secondary | ICD-10-CM | POA: Diagnosis present

## 2014-04-18 DIAGNOSIS — M109 Gout, unspecified: Secondary | ICD-10-CM | POA: Diagnosis present

## 2014-04-18 DIAGNOSIS — J9620 Acute and chronic respiratory failure, unspecified whether with hypoxia or hypercapnia: Secondary | ICD-10-CM

## 2014-04-18 LAB — CBC WITH DIFFERENTIAL/PLATELET
Basophils Absolute: 0 10*3/uL (ref 0.0–0.1)
Basophils Relative: 0 % (ref 0–1)
Eosinophils Absolute: 0 10*3/uL (ref 0.0–0.7)
Eosinophils Relative: 0 % (ref 0–5)
HEMATOCRIT: 35.8 % — AB (ref 36.0–46.0)
Hemoglobin: 10.8 g/dL — ABNORMAL LOW (ref 12.0–15.0)
LYMPHS PCT: 9 % — AB (ref 12–46)
Lymphs Abs: 0.7 10*3/uL (ref 0.7–4.0)
MCH: 27.7 pg (ref 26.0–34.0)
MCHC: 30.2 g/dL (ref 30.0–36.0)
MCV: 91.8 fL (ref 78.0–100.0)
Monocytes Absolute: 0.5 10*3/uL (ref 0.1–1.0)
Monocytes Relative: 6 % (ref 3–12)
Neutro Abs: 6.7 10*3/uL (ref 1.7–7.7)
Neutrophils Relative %: 85 % — ABNORMAL HIGH (ref 43–77)
Platelets: 215 10*3/uL (ref 150–400)
RBC: 3.9 MIL/uL (ref 3.87–5.11)
RDW: 15.9 % — ABNORMAL HIGH (ref 11.5–15.5)
WBC: 7.9 10*3/uL (ref 4.0–10.5)

## 2014-04-18 LAB — URINALYSIS, ROUTINE W REFLEX MICROSCOPIC
Bilirubin Urine: NEGATIVE
GLUCOSE, UA: NEGATIVE mg/dL
KETONES UR: NEGATIVE mg/dL
Leukocytes, UA: NEGATIVE
Nitrite: NEGATIVE
PH: 5.5 (ref 5.0–8.0)
Protein, ur: >300 mg/dL — AB
Specific Gravity, Urine: 1.01 (ref 1.005–1.030)
Urobilinogen, UA: 0.2 mg/dL (ref 0.0–1.0)

## 2014-04-18 LAB — COMPREHENSIVE METABOLIC PANEL
ALT: 21 U/L (ref 0–35)
AST: 21 U/L (ref 0–37)
Albumin: 3.6 g/dL (ref 3.5–5.2)
Alkaline Phosphatase: 77 U/L (ref 39–117)
Anion gap: 9 (ref 5–15)
BILIRUBIN TOTAL: 0.3 mg/dL (ref 0.3–1.2)
BUN: 24 mg/dL — ABNORMAL HIGH (ref 6–23)
CHLORIDE: 93 meq/L — AB (ref 96–112)
CO2: 39 meq/L — AB (ref 19–32)
CREATININE: 1.22 mg/dL — AB (ref 0.50–1.10)
Calcium: 9.8 mg/dL (ref 8.4–10.5)
GFR, EST AFRICAN AMERICAN: 52 mL/min — AB (ref >90)
GFR, EST NON AFRICAN AMERICAN: 44 mL/min — AB (ref >90)
GLUCOSE: 202 mg/dL — AB (ref 70–99)
Potassium: 4.1 mEq/L (ref 3.7–5.3)
Sodium: 141 mEq/L (ref 137–147)
Total Protein: 7.6 g/dL (ref 6.0–8.3)

## 2014-04-18 LAB — BLOOD GAS, ARTERIAL
Acid-Base Excess: 11.2 mmol/L — ABNORMAL HIGH (ref 0.0–2.0)
BICARBONATE: 38.2 meq/L — AB (ref 20.0–24.0)
Drawn by: 312971
O2 Content: 3 L/min
O2 SAT: 90.4 %
PO2 ART: 66.5 mmHg — AB (ref 80.0–100.0)
Patient temperature: 98.6
TCO2: 40.9 mmol/L (ref 0–100)
pCO2 arterial: 86.6 mmHg (ref 35.0–45.0)
pH, Arterial: 7.267 — ABNORMAL LOW (ref 7.350–7.450)

## 2014-04-18 LAB — GLUCOSE, CAPILLARY: GLUCOSE-CAPILLARY: 187 mg/dL — AB (ref 70–99)

## 2014-04-18 LAB — URINE MICROSCOPIC-ADD ON

## 2014-04-18 LAB — PRO B NATRIURETIC PEPTIDE: Pro B Natriuretic peptide (BNP): 554.9 pg/mL — ABNORMAL HIGH (ref 0–125)

## 2014-04-18 LAB — LACTIC ACID, PLASMA: Lactic Acid, Venous: 0.8 mmol/L (ref 0.5–2.2)

## 2014-04-18 LAB — MRSA PCR SCREENING: MRSA BY PCR: NEGATIVE

## 2014-04-18 LAB — TROPONIN I

## 2014-04-18 MED ORDER — SODIUM CHLORIDE 0.9 % IV SOLN
250.0000 mL | INTRAVENOUS | Status: DC | PRN
Start: 2014-04-18 — End: 2014-04-21

## 2014-04-18 MED ORDER — RIFAXIMIN 550 MG PO TABS
550.0000 mg | ORAL_TABLET | Freq: Three times a day (TID) | ORAL | Status: DC
  Administered 2014-04-18 – 2014-04-19 (×2): 550 mg via ORAL
  Filled 2014-04-18 (×11): qty 1

## 2014-04-18 MED ORDER — ONDANSETRON HCL 4 MG/2ML IJ SOLN: 4.0000 mg | Freq: Four times a day (QID) | INTRAMUSCULAR | Status: DC | PRN

## 2014-04-18 MED ORDER — SODIUM CHLORIDE 0.9 % IJ SOLN: 3.0000 mL | INTRAMUSCULAR | Status: DC | PRN

## 2014-04-18 MED ORDER — PRAVASTATIN SODIUM 40 MG PO TABS
40.0000 mg | ORAL_TABLET | Freq: Every day | ORAL | Status: DC
  Administered 2014-04-18 – 2014-04-20 (×2): 40 mg via ORAL
  Filled 2014-04-18 (×4): qty 1

## 2014-04-18 MED ORDER — HYDRALAZINE HCL 25 MG PO TABS
25.0000 mg | ORAL_TABLET | Freq: Three times a day (TID) | ORAL | Status: DC
  Administered 2014-04-18 – 2014-04-21 (×8): 25 mg via ORAL
  Filled 2014-04-18 (×11): qty 1

## 2014-04-18 MED ORDER — BUDESONIDE 0.25 MG/2ML IN SUSP
0.2500 mg | Freq: Two times a day (BID) | RESPIRATORY_TRACT | Status: DC
  Filled 2014-04-18 (×2): qty 2

## 2014-04-18 MED ORDER — ALBUTEROL SULFATE (2.5 MG/3ML) 0.083% IN NEBU
INHALATION_SOLUTION | RESPIRATORY_TRACT | Status: AC
  Administered 2014-04-18: 2.5 mg via RESPIRATORY_TRACT
  Filled 2014-04-18: qty 3

## 2014-04-18 MED ORDER — IRON 325 (65 FE) MG PO TABS: 1.0000 | ORAL_TABLET | Freq: Every day | ORAL | Status: DC

## 2014-04-18 MED ORDER — IPRATROPIUM-ALBUTEROL 0.5-2.5 (3) MG/3ML IN SOLN
3.0000 mL | Freq: Four times a day (QID) | RESPIRATORY_TRACT | Status: DC
  Administered 2014-04-18 – 2014-04-19 (×5): 3 mL via RESPIRATORY_TRACT
  Filled 2014-04-18 (×5): qty 3

## 2014-04-18 MED ORDER — FUROSEMIDE 10 MG/ML IJ SOLN
20.0000 mg | Freq: Once | INTRAMUSCULAR | Status: AC
  Administered 2014-04-18: 20 mg via INTRAVENOUS

## 2014-04-18 MED ORDER — PANTOPRAZOLE SODIUM 40 MG PO TBEC
40.0000 mg | DELAYED_RELEASE_TABLET | Freq: Every day | ORAL | Status: DC
  Administered 2014-04-18 – 2014-04-21 (×4): 40 mg via ORAL
  Filled 2014-04-18 (×4): qty 1

## 2014-04-18 MED ORDER — ALBUTEROL SULFATE (2.5 MG/3ML) 0.083% IN NEBU
2.5000 mg | INHALATION_SOLUTION | RESPIRATORY_TRACT | Status: DC | PRN
  Administered 2014-04-18: 2.5 mg via RESPIRATORY_TRACT

## 2014-04-18 MED ORDER — FERROUS SULFATE 325 (65 FE) MG PO TABS
325.0000 mg | ORAL_TABLET | Freq: Every day | ORAL | Status: DC
  Administered 2014-04-19 – 2014-04-21 (×3): 325 mg via ORAL
  Filled 2014-04-18 (×5): qty 1

## 2014-04-18 MED ORDER — ACETAMINOPHEN 325 MG PO TABS: 650.0000 mg | ORAL_TABLET | ORAL | Status: DC | PRN

## 2014-04-18 MED ORDER — LEVOFLOXACIN IN D5W 750 MG/150ML IV SOLN
750.0000 mg | INTRAVENOUS | Status: DC
  Administered 2014-04-18 – 2014-04-20 (×2): 750 mg via INTRAVENOUS
  Filled 2014-04-18 (×3): qty 150

## 2014-04-18 MED ORDER — HYDRALAZINE HCL 20 MG/ML IJ SOLN: 10.0000 mg | INTRAMUSCULAR | Status: DC | PRN

## 2014-04-18 MED ORDER — SODIUM CHLORIDE 0.9 % IJ SOLN
3.0000 mL | Freq: Two times a day (BID) | INTRAMUSCULAR | Status: DC
  Administered 2014-04-18 – 2014-04-20 (×5): 3 mL via INTRAVENOUS

## 2014-04-18 MED ORDER — HEPARIN SODIUM (PORCINE) 5000 UNIT/ML IJ SOLN
5000.0000 [IU] | Freq: Three times a day (TID) | INTRAMUSCULAR | Status: DC
  Administered 2014-04-18 – 2014-04-21 (×8): 5000 [IU] via SUBCUTANEOUS
  Filled 2014-04-18 (×11): qty 1

## 2014-04-18 MED ORDER — INSULIN ASPART 100 UNIT/ML ~~LOC~~ SOLN
0.0000 [IU] | Freq: Three times a day (TID) | SUBCUTANEOUS | Status: DC
  Administered 2014-04-18: 4 [IU] via SUBCUTANEOUS
  Administered 2014-04-19: 3 [IU] via SUBCUTANEOUS
  Administered 2014-04-19: 4 [IU] via SUBCUTANEOUS
  Administered 2014-04-19 (×2): 3 [IU] via SUBCUTANEOUS
  Administered 2014-04-20: 4 [IU] via SUBCUTANEOUS

## 2014-04-18 MED ORDER — METHYLPREDNISOLONE SODIUM SUCC 125 MG IJ SOLR
80.0000 mg | Freq: Three times a day (TID) | INTRAMUSCULAR | Status: DC
  Administered 2014-04-18 – 2014-04-19 (×2): 80 mg via INTRAVENOUS
  Filled 2014-04-18 (×3): qty 1.28
  Filled 2014-04-18: qty 2
  Filled 2014-04-18 (×2): qty 1.28

## 2014-04-18 MED ORDER — FUROSEMIDE 10 MG/ML IJ SOLN
INTRAMUSCULAR | Status: AC
  Filled 2014-04-18: qty 4

## 2014-04-18 MED ORDER — BUDESONIDE 0.25 MG/2ML IN SUSP
0.5000 mg | Freq: Two times a day (BID) | RESPIRATORY_TRACT | Status: DC
  Administered 2014-04-18 – 2014-04-20 (×4): 0.5 mg via RESPIRATORY_TRACT
  Filled 2014-04-18 (×6): qty 4

## 2014-04-18 MED ORDER — SERTRALINE HCL 50 MG PO TABS
50.0000 mg | ORAL_TABLET | Freq: Every day | ORAL | Status: DC
  Administered 2014-04-18 – 2014-04-20 (×3): 50 mg via ORAL
  Filled 2014-04-18 (×4): qty 1

## 2014-04-18 NOTE — Assessment & Plan Note (Signed)
Exacerbation with Acute on Chronic RF  Admit , see H/P

## 2014-04-18 NOTE — Patient Instructions (Signed)
Admit  

## 2014-04-18 NOTE — Progress Notes (Signed)
Pt. Tolerated BIPAP for 30 mins. When she was first put on it. Pt. Was taken off BIPAP by RN & placed on 4L [place] by RN. RN placed pt. Back on BIPAP & pt. Is tolerating BIPAP well at this time without any complications.

## 2014-04-18 NOTE — Progress Notes (Signed)
CRITICAL VALUE ALERT  Critical value received:  pCO2  Date of notification:  04/18/14  Time of notification:  9449   Critical value read back:Yes.    Nurse who received alert:  Alease Frame, RN  MD notified (1st page):  Elsworth Soho. MD   Time of first page:  1847  MD notified (2nd page):  Time of second page:  Responding MD:  Elsworth Soho, MD   Time MD responded: [email or web address]  Dr. Elsworth Soho also made aware of increased patient anxiety.

## 2014-04-18 NOTE — Plan of Care (Signed)
Problem: Phase I Progression Outcomes Goal: O2 sats > or equal 90% or at baseline Outcome: Completed/Met Date Met:  04/18/14 Goal: Dyspnea controlled at rest Outcome: Completed/Met Date Met:  04/18/14     

## 2014-04-18 NOTE — Progress Notes (Signed)
Pt tolerated Bi-Pap approximately 30 minutes before adamantly requesting its removal. Pt A+Ox4 and appropriate and consequently Bi-Pap removed. Pt current SpO2 93-94% on 4L [place] (@2018 ). MD Elsworth Soho) notified of pt's intolerance to Bi-Pap and this RN requested anti-anxiety medication to assist with tolerance. No orders received. Dr. Elsworth Soho endorsed pt could remain off Bi-Pap with CO2 of 88. Pt in no current obvious distress. Will continue to monitor and encourage use of Bi-Pap.

## 2014-04-18 NOTE — H&P (Signed)
PULMONARY / CRITICAL CARE MEDICINE   Name: [name] MRN: [id number] DOB: 01-23-1946    ADMISSION DATE:  (Not on file)    CHIEF COMPLAINT:  "cant breath"   INITIAL PRESENTATION: 68 year old with severe COPD/O2 dependent admitted from office 04/18/14  with A/C Hypoxic RF and AECOPD +/- fluid overload   STUDIES:    SIGNIFICANT EVENTS: 04/18/2014 Admit from office    HISTORY OF PRESENT ILLNESS:   Patient presents for an acute office visit. She complains of progressively worsening shortness of breath, cough, congestion, sleepiness, fatigue, and wheezing. Patient drove herself to the office and on arrival was unable to get out of her car in the parking lot and needed assistance Patient is normally on oxygen at 2 L at home. On arrival to the office today O2 saturation was 71% on 2 L. Her oxygen was increased to 3 L continuous flow with O2 saturation at 90%. Patient was mildly lethargic on arrival. She denies any hemoptysis, chest pain, palpitations, abdominal pain, nausea, vomiting, diarrhea. Has noted that her ankles, a bit more swollen over the last week. She will need hospital admission for further evaluation and treatment options.   PAST MEDICAL HISTORY :   has a past medical history of Asthma; COPD (chronic obstructive pulmonary disease); Hypertension; Diabetes mellitus (09/13/2010); Gout; Oxygen dependent; Pericardial effusion (08/2011); Hyperlipidemia; Type II or unspecified type diabetes mellitus with neurological manifestations, uncontrolled; Personal history of noncompliance with medical treatment, presenting hazards to health; Unspecified disease of pericardium; Hemorrhage of rectum and anus; Panic disorder without agoraphobia; Depressive disorder, not elsewhere classified; Edema; Anxiety state, unspecified; Unspecified hereditary and idiopathic peripheral neuropathy; Type II or unspecified type diabetes mellitus without mention of complication, not stated as uncontrolled;  Other and unspecified hyperlipidemia; Obesity, unspecified; Unspecified tinnitus; Unspecified essential hypertension; Pneumonia, organism unspecified; Chronic obstructive asthma, unspecified; Acute and chronic respiratory failure; Urinary frequency; Urgency of urination; and Anemia due to chronic blood loss (01/04/2013).  has past surgical history that includes Breast biopsy; Total abdominal hysterectomy; Esophagogastroduodenoscopy (N/A, 01/03/2013); and Colonoscopy (N/A, 01/05/2013). Prior to Admission medications   Medication Sig Start Date End Date Taking? Authorizing Provider  albuterol (PROAIR HFA) 108 (90 BASE) MCG/ACT inhaler Inhale 2 puffs into the lungs every 4 (four) hours as needed for shortness of breath. 01/21/14   [name] L Reed, DO  albuterol (PROVENTIL) (2.5 MG/3ML) 0.083% nebulizer solution Take 2.5 mg by nebulization every 6 (six) hours as needed for shortness of breath.     Historical Provider, MD  budesonide-formoterol (SYMBICORT) 80-4.5 MCG/ACT inhaler Inhale 2 puffs into the lungs 2 (two) times daily. 04/05/14   [name] L Reed, DO  colchicine 0.6 MG tablet Take 1 tablet (0.6 mg total) by mouth as needed. 01/21/14   [name] L Reed, DO  Dextromethorphan-Guaifenesin (MUCINEX DM) 30-600 MG TB12 Take 1 tablet by mouth 2 (two) times daily as needed. 06/25/13   Billy Fischer, MD  Ferrous Sulfate (IRON) 325 (65 FE) MG TABS Take 1 tablet by mouth daily.      Historical Provider, MD  hydrALAZINE (APRESOLINE) 25 MG tablet Take 1 tablet (25 mg total) by mouth every 8 (eight) hours. 01/21/14   [name] L Reed, DO  hydrochlorothiazide (HYDRODIURIL) 25 MG tablet Take one tablet by mouth once daily for blood pressure 03/08/14   Blanchie Serve, MD  losartan (COZAAR) 100 MG tablet TAKE ONE TABLET BY MOUTH ONCE DAILY 03/08/14   Estill Dooms, MD  lovastatin (MEVACOR) 40 MG tablet Take 1 tablet (  40 mg total) by mouth at bedtime. For hyperlipedemia 10/27/13   [name] L Reed, DO  metFORMIN (GLUCOPHAGE) 1000 MG tablet  TAKE ONE TABLET BY MOUTH TWICE DAILY WITH MEALS 04/15/14   Lauree Chandler, NP  metroNIDAZOLE (FLAGYL) 250 MG tablet Take 1 tablet (250 mg total) by mouth 3 (three) times daily. 03/21/14   Jerene Bears, MD  pantoprazole (PROTONIX) 40 MG tablet Take 1 tablet (40 mg total) by mouth daily. 02/16/14   Jerene Bears, MD  promethazine-codeine (PHENERGAN WITH CODEINE) 6.25-10 MG/5ML syrup Take 5 mLs by mouth every 6 (six) hours as needed for cough. 04/14/14   Deneise Lever, MD  rifaximin (XIFAXAN) 550 MG TABS tablet Take 1 tablet (550 mg total) by mouth 3 (three) times daily. 02/16/14   Jerene Bears, MD  sertraline (ZOLOFT) 50 MG tablet Take 1 tablet (50 mg total) by mouth at bedtime. 01/21/14   [name] L Reed, DO  tiotropium (SPIRIVA) 18 MCG inhalation capsule Place 1 capsule (18 mcg total) into inhaler and inhale daily. 10/22/13   [name] L Reed, DO   Allergies  Allergen Reactions  . Lisinopril Swelling    Angioedema   . Advair Diskus [Fluticasone-Salmeterol] Other (See Comments)    Nose bleeds.   . Penicillins     hives    FAMILY HISTORY:  indicated that her mother is deceased. She indicated that her father is deceased. She indicated that both of her sisters are alive. She indicated that both of her brothers are alive. She indicated that her daughter is alive. She indicated that her son is alive.  SOCIAL HISTORY:  reports that she quit smoking about 9 years ago. Her smoking use included Cigarettes. She has a 30 pack-year smoking history. She has never used smokeless tobacco. She reports that she drinks about 1.0 oz of alcohol per week. She reports that she does not use illicit drugs.  REVIEW OF SYSTEMS:   .Constitutional: No weight loss, night sweats, Fevers, chills, +fatigue, or lassitude.  HEENT: No headaches, Difficulty swallowing, Tooth/dental problems, or Sore throat,   No sneezing, itching, ear ache, nasal congestion, post nasal drip,   CV: No chest pain, Orthopnea,  PND, swelling in lower extremities, anasarca, dizziness, palpitations, syncope.   GI No heartburn, indigestion, abdominal pain, nausea, vomiting, diarrhea, change in bowel habits, loss of appetite, bloody stools.   Resp: No chest wall deformity  Skin: no rash or lesions.  GU: no dysuria, change in color of urine, no urgency or frequency. No flank pain, no hematuria   MS: No joint pain or swelling. No decreased range of motion. No back pain.  Psych: No change in mood or affect. No depression or anxiety. No memory loss.  SUBJECTIVE: "cant breath"   VITAL SIGNS: Temp:  [98.7 F (37.1 C)] 98.7 F (37.1 C) (11/02 1653) Pulse Rate:  [118] 118 (11/02 1653) BP: (128)/(84) 128/84 mmHg (11/02 1653) SpO2:  [71 %] 71 % (11/02 1653) HEMODYNAMICS:   VENTILATOR SETTINGS:   INTAKE / OUTPUT: No intake or output data in the 24 hours ending 04/18/14 1703  PHYSICAL EXAMINATION: GEN: A/Ox3; pleasant , mild lethargy , in wheelchair  HEENT: [place]/AT, EACs-clear, TMs-wnl, NOSE-clear, THROAT-clear, no lesions, no postnasal drip or exudate noted.   NECK: Supple w/ fair ROM; no JVD; normal carotid impulses w/o bruits; no thyromegaly or nodules palpated; no lymphadenopathy.   RESP Decreased BS in bases . no accessory muscle use, no dullness to percussion  CARD: ST , no m/r/g ,  1+ peripheral edema, pulses intact, no cyanosis or clubbing.  GI: Soft & nt; nml bowel sounds; no organomegaly or masses detected.   Musco: Warm bil, no deformities or joint swelling noted.   Neuro: alert, no focal deficits noted.   Skin: Warm, no lesions or rashes    LABS:  CBC No results for input(s): WBC, HGB, HCT, PLT in the last 168 hours. Coag's No results for input(s): APTT, INR in the last 168 hours. BMET No results for input(s): NA, K, CL, CO2, BUN, CREATININE, GLUCOSE in the last 168 hours. Electrolytes No results for input(s): CALCIUM, MG, PHOS in the last 168 hours. Sepsis Markers No  results for input(s): LATICACIDVEN, PROCALCITON, O2SATVEN in the last 168 hours. ABG No results for input(s): PHART, PCO2ART, PO2ART in the last 168 hours. Liver Enzymes No results for input(s): AST, ALT, ALKPHOS, BILITOT, ALBUMIN in the last 168 hours. Cardiac Enzymes No results for input(s): TROPONINI, PROBNP in the last 168 hours. Glucose No results for input(s): GLUCAP in the last 168 hours.  Imaging No results found.   ASSESSMENT / PLAN:  PULMONARY  A:AECOPD  Acute on Chronic Hypoxic Resp Failure +/- Hypercarbia  P:   Admit to SDU  CXR /ABG pending  Begin IV abx-Levaquin  IV solumedrol 80 q8  BD nebs  May need BIPAP depending on ABG results.    CARDIOVASCULAR  A: HTN  Fluid overload suspected diastolic dysfunction   P:  Hold Cozaar for now until bmet returns  Hold HCTZ  Lasix 20 IV x 1  Labs with troponin x 1 , EKG and bnp  Cont hydralazine  Hep Sq for DVT prophylaxis      RENAL A:  Chronic renal insufficiency (baseline scr 1.15)  P:   Strict I/O  Monitor and replace electrolytes as indicated.    GASTROINTESTINAL A:  GERD   P:   PPI  Cont Xifaxan   HEMATOLOGIC A:  No known acute issues  P:  Labs pending    INFECTIOUS A:  AECOPD  P:   CXR and labs pending  Abx: Levaquin  start date 11/2 , day 1/7  ENDOCRINE A:  DM  P:   SSI  Hold glucophage   NEUROLOGIC A:  Lethargic suspect secondary to Hypercarbia  P:   ABG is pending  Avoid sedating rx if possible RASS goal: n/a     FAMILY  - Updates: None available  - Inter-disciplinary family meet or Palliative Care meeting due by:  day 7  PARRETT,TAMMY NP-C  Pulmonary and [place] Pager: [phone]  04/18/2014, 5:03 PM    Reviewed above, and examined.  68 yo female former smoker presented to office with several days of dypsnea, cough, and wheeze.  She uses oxygen at home, but not to have significant hypoxia in office.  She is visible short  of breath, using accessory muscles, and has b/l wheeze with poor air entry on exam.  Will tx for AECOPD with IV steroids, IV Abx, scheduled BD's.  Will use BiPAP prn and supplemental oxygen to keep SpO2 > 90%.  F/u Labs and CXR.  [name], MD [facility] Pulmonary/Critical Care 04/18/2014, 6:16 PM Pager:  [phone] After 3pm call: [phone]

## 2014-04-18 NOTE — Telephone Encounter (Signed)
Called and spoke to pt. Pt c/o increase in SOB, increase in mucus production but unable to bring mucus up, left leg swollen x 1 week and insomnia. Appt made with TP today (04/18/14) at 4:15. Pt verbalized understanding and denied any further questions or concerns at this time.

## 2014-04-18 NOTE — Progress Notes (Signed)
ANTIBIOTIC CONSULT NOTE - INITIAL  Pharmacy Consult for levofloxacin Indication: COPD flare  Allergies  Allergen Reactions  . Lisinopril Swelling    Angioedema   . Advair Diskus [Fluticasone-Salmeterol] Other (See Comments)    Nose bleeds.   . Penicillins     hives    Patient Measurements: Height: 5' 2.5" (158.8 cm) Weight: 183 lb (83.008 kg) IBW/kg (Calculated) : 51.25   Vital Signs: Temp: 98.6 F (37 C) (11/02 1800) Temp Source: Oral (11/02 1800) BP: 204/110 mmHg (11/02 1800) Pulse Rate: 122 (11/02 1800) Intake/Output from previous day:   Intake/Output from this shift:    Labs: No results for input(s): WBC, HGB, PLT, LABCREA, CREATININE in the last 72 hours. Estimated Creatinine Clearance: 47.3 mL/min (by C-G formula based on Cr of 1.15). No results for input(s): VANCOTROUGH, VANCOPEAK, VANCORANDOM, GENTTROUGH, GENTPEAK, GENTRANDOM, TOBRATROUGH, TOBRAPEAK, TOBRARND, AMIKACINPEAK, AMIKACINTROU, AMIKACIN in the last 72 hours.   Microbiology: No results found for this or any previous visit (from the past 720 hour(s)).  Medical History: Past Medical History  Diagnosis Date  . Asthma   . COPD (chronic obstructive pulmonary disease)   . Hypertension   . Diabetes mellitus 09/13/2010    glucophage  . Gout   . Oxygen dependent     pt uses O2   . Pericardial effusion 08/2011    small residual on echo  . Hyperlipidemia   . Type II or unspecified type diabetes mellitus with neurological manifestations, uncontrolled   . Personal history of noncompliance with medical treatment, presenting hazards to health   . Unspecified disease of pericardium   . Hemorrhage of rectum and anus   . Panic disorder without agoraphobia   . Depressive disorder, not elsewhere classified   . Edema   . Anxiety state, unspecified   . Unspecified hereditary and idiopathic peripheral neuropathy   . Type II or unspecified type diabetes mellitus without mention of complication, not stated as  uncontrolled   . Other and unspecified hyperlipidemia   . Obesity, unspecified   . Unspecified tinnitus   . Unspecified essential hypertension   . Pneumonia, organism unspecified   . Chronic obstructive asthma, unspecified   . Acute and chronic respiratory failure   . Urinary frequency   . Urgency of urination   . Anemia due to chronic blood loss 01/04/2013    Assessment: 5 YOF admitted with hypoxia and fluid overload. With history of COPD (O2 dependent) and suspected exacerbation. Last SCr from 01/21/2014 was 1.15 (appears to be around baseline), which would give an estimated CrCl of ~20mL/min.  Goal of Therapy:  eradication of infection  Plan:  1. Start levofloxacin 750mg  IV q48h 2. Follow renal function and adjust dose if needed 3. Follow c/s and clinical progression  Kinzy Weyers D. Cordelro Gautreau, PharmD, BCPS Clinical Pharmacist Pager: [phone] 04/18/2014 6:17 PM

## 2014-04-18 NOTE — Progress Notes (Signed)
   Subjective:    Patient ID: [name], female    DOB: [birth year], 68 y.o.   MRN: [id number]  HPI Severe COPD -O2 depenedent (advair causes epistaxis)  chronic respiratory failure- MM phenotype Nov 2012  - Spirometry Nov 2012: Fev1 0.85L/45%, Ratio  =- COPD based on age, spiormetry and smoking hx and emphysema on CT 2014  04/18/2014 Acute OV  Patient presents for an acute office visit. She complains of progressively worsening shortness of breath, cough, congestion, sleepiness, fatigue, and wheezing.  Patient drove herself to the office and on arrival was unable to get out of her car in the parking lot and needed assistance Patient is normally on oxygen at 2 L at home. On arrival to the office today O2 saturation was 71% on 2 L. Her oxygen was increased to 3 L continuous flow with O2 saturation at 90%. Patient was mildly lethargic on arrival. She denies any hemoptysis, chest pain, palpitations, abdominal pain, nausea, vomiting, diarrhea. Has noted that her ankles, a bit more swollen over the last week. She will need hospital admission for further evaluation and treatment options.   Review of Systems .Constitutional:   No  weight loss, night sweats,  Fevers, chills, fatigue, or  lassitude.  HEENT:   No headaches,  Difficulty swallowing,  Tooth/dental problems, or  Sore throat,                No sneezing, itching, ear ache, nasal congestion, post nasal drip,   CV:  No chest pain,  Orthopnea, PND, swelling in lower extremities, anasarca, dizziness, palpitations, syncope.   GI  No heartburn, indigestion, abdominal pain, nausea, vomiting, diarrhea, change in bowel habits, loss of appetite, bloody stools.   Resp:  No chest wall deformity  Skin: no rash or lesions.  GU: no dysuria, change in color of urine, no urgency or frequency.  No flank pain, no hematuria   MS:  No joint pain or swelling.  No decreased range of motion.  No back pain.  Psych:  No change in mood or affect.  No depression or anxiety.  No memory loss.         Objective:   Physical Exam GEN: A/Ox3; pleasant , mild lethargy , in wheelchair   HEENT:  [place]/AT,  EACs-clear, TMs-wnl, NOSE-clear, THROAT-clear, no lesions, no postnasal drip or exudate noted.   NECK:  Supple w/ fair ROM; no JVD; normal carotid impulses w/o bruits; no thyromegaly or nodules palpated; no lymphadenopathy.  RESP  Decreased BS in bases .no accessory muscle use, no dullness to percussion  CARD: ST , no m/r/g  , 1+ peripheral edema, pulses intact, no cyanosis or clubbing.  GI:   Soft & nt; nml bowel sounds; no organomegaly or masses detected.  Musco: Warm bil, no deformities or joint swelling noted.   Neuro: alert, no focal deficits noted.    Skin: Warm, no lesions or rashes         Assessment & Plan:

## 2014-04-19 ENCOUNTER — Inpatient Hospital Stay (HOSPITAL_COMMUNITY): Payer: Medicare Other

## 2014-04-19 LAB — GLUCOSE, CAPILLARY
GLUCOSE-CAPILLARY: 137 mg/dL — AB (ref 70–99)
Glucose-Capillary: 141 mg/dL — ABNORMAL HIGH (ref 70–99)
Glucose-Capillary: 122 mg/dL — ABNORMAL HIGH (ref 70–99)
Glucose-Capillary: 152 mg/dL — ABNORMAL HIGH (ref 70–99)

## 2014-04-19 LAB — BASIC METABOLIC PANEL
Anion gap: 13 (ref 5–15)
BUN: 25 mg/dL — AB (ref 6–23)
CO2: 37 mEq/L — ABNORMAL HIGH (ref 19–32)
Calcium: 9.2 mg/dL (ref 8.4–10.5)
Chloride: 92 mEq/L — ABNORMAL LOW (ref 96–112)
Creatinine, Ser: 1.1 mg/dL (ref 0.50–1.10)
GFR calc Af Amer: 58 mL/min — ABNORMAL LOW (ref >90)
GFR, EST NON AFRICAN AMERICAN: 50 mL/min — AB (ref >90)
Glucose, Bld: 127 mg/dL — ABNORMAL HIGH (ref 70–99)
Potassium: 4.1 mEq/L (ref 3.7–5.3)
Sodium: 142 mEq/L (ref 137–147)

## 2014-04-19 LAB — CBC
HCT: 37.2 % (ref 36.0–46.0)
Hemoglobin: 11.1 g/dL — ABNORMAL LOW (ref 12.0–15.0)
MCH: 27.6 pg (ref 26.0–34.0)
MCHC: 29.8 g/dL — AB (ref 30.0–36.0)
MCV: 92.5 fL (ref 78.0–100.0)
Platelets: 226 10*3/uL (ref 150–400)
RBC: 4.02 MIL/uL (ref 3.87–5.11)
RDW: 16 % — ABNORMAL HIGH (ref 11.5–15.5)
WBC: 8.4 10*3/uL (ref 4.0–10.5)

## 2014-04-19 LAB — PROCALCITONIN: Procalcitonin: <0.1 ng/mL

## 2014-04-19 MED ORDER — FUROSEMIDE 10 MG/ML IJ SOLN
40.0000 mg | Freq: Once | INTRAMUSCULAR | Status: AC
  Administered 2014-04-19: 40 mg via INTRAVENOUS
  Filled 2014-04-19: qty 4

## 2014-04-19 MED ORDER — METHYLPREDNISOLONE SODIUM SUCC 40 MG IJ SOLR
40.0000 mg | Freq: Three times a day (TID) | INTRAMUSCULAR | Status: DC
  Administered 2014-04-19 – 2014-04-20 (×3): 40 mg via INTRAVENOUS
  Filled 2014-04-19 (×6): qty 1

## 2014-04-19 NOTE — Progress Notes (Signed)
Utilization review completed.  

## 2014-04-19 NOTE — Progress Notes (Signed)
PULMONARY / CRITICAL CARE MEDICINE   Name: [name] MRN: [id number] DOB: Sep 27, 1945    ADMISSION DATE:  04/18/2014  CHIEF COMPLAINT:  "cant breath"   INITIAL PRESENTATION:  68 yo female former smoker admitted from pulmonary office with acute on chronic hypoxic/hypercapnic respiratory failure from AECOPD, and pulmonary edema.  STUDIES:   SIGNIFICANT EVENTS: 11/02 Admit  SUBJECTIVE:  Breathing improved this AM.  Felt like BiPAP put her in a very serene place last night, and helped her breathing.  Denies chest pain.  VITAL SIGNS: Temp:  [97.9 F (36.6 C)-99.6 F (37.6 C)] 97.9 F (36.6 C) (11/03 0800) Pulse Rate:  [95-123] 99 (11/03 0800) Resp:  [13-31] 26 (11/03 0800) BP: (128-204)/(79-110) 139/88 mmHg (11/03 0630) SpO2:  [71 %-100 %] 95 % (11/03 0857) FiO2 (%):  [40 %-50 %] 50 % (11/03 0005) Weight:  [181 lb (82.1 kg)-192 lb 14.4 oz (87.5 kg)] 181 lb (82.1 kg) (11/03 0500) INTAKE / OUTPUT:  Intake/Output Summary (Last 24 hours) at 04/19/14 0936 Last data filed at 04/19/14 0600  Gross per 24 hour  Intake    440 ml  Output   1125 ml  Net   -685 ml    PHYSICAL EXAMINATION: GEN: purse lip breathing, speaks in full sentences HEENT: no sinus tenderness RESP: poor air movement, no wheeze/rales CARD: regular, no murmur GI: soft, non tender Musco: 1+ edema Neuro: normal strength Skin: no rashes  LABS:  CBC  Recent Labs Lab 04/18/14 2005 04/19/14 0321  WBC 7.9 8.4  HGB 10.8* 11.1*  HCT 35.8* 37.2  PLT 215 226   BMET  Recent Labs Lab 04/18/14 2005 04/19/14 0321  NA 141 142  K 4.1 4.1  CL 93* 92*  CO2 39* 37*  BUN 24* 25*  CREATININE 1.22* 1.10  GLUCOSE 202* 127*   Electrolytes  Recent Labs Lab 04/18/14 2005 04/19/14 0321  CALCIUM 9.8 9.2   Sepsis Markers  Recent Labs Lab 04/18/14 2005  LATICACIDVEN 0.8  PROCALCITON <0.10   ABG  Recent Labs Lab 04/18/14 1835  PHART 7.267*  PCO2ART 86.6*  PO2ART 66.5*   Liver  Enzymes  Recent Labs Lab 04/18/14 2005  AST 21  ALT 21  ALKPHOS 77  BILITOT 0.3  ALBUMIN 3.6   Cardiac Enzymes  Recent Labs Lab 04/18/14 2005  TROPONINI <0.30  PROBNP 554.9*   Glucose  Recent Labs Lab 04/18/14 2200 04/19/14 0753  GLUCAP 187* 141*    Imaging Dg Chest Port 1 View  04/18/2014   CLINICAL DATA:  Severe shortness of breath today.  EXAM: PORTABLE CHEST - 1 VIEW  COMPARISON:  Chest x-ray 02/03/2014  FINDINGS: The heart is enlarged but stable. There is tortuosity and ectasia of the thoracic aorta. There is vascular congestion and interstitial edema along with small pleural effusions consistent with CHF. No focal infiltrates. The bony thorax is intact.  IMPRESSION: CHF.   Electronically Signed   By: Kalman Jewels M.D.   On: 04/18/2014 18:37     ASSESSMENT / PLAN:  PULMONARY A: Acute on chronic hypoxic/hypercapnic respiratory failure 2nd to AECOPD and pulmonary edema. P:   Oxygen to keep SpO2 > 90% BiPAP qhs and prn during the day F/u CXR Continue pulmicort and scheduled duoneb Change solumedrol to 40 mg q8h  CARDIOVASCULAR A:  Acute on chronic diastolic heart failure with hx of HTN. Hx of HLD. P:  Lasix 40 mg IV x one on 11/03 Continue hydralazine Hold HCTZ, cozaar for now Continue lovastatin >> pravastatin  while in hospital  RENAL A:   Chronic renal insufficiency (baseline scr 1.15). P:   Monitor renal fx, urine outpt, electrolytes  GASTROINTESTINAL A:  Hx of GERD. P:   PPI  Cont Xifaxan   HEMATOLOGIC A:   Mild anemia. P:  F/u CBC SQ heparin for DVT prevention  INFECTIOUS A:   AECOPD. P:   Day 2 of Levaquin, started11/2  ENDOCRINE A:   DM type II with renal complications. Hx of gout. P:   SSI  Hold glucophage  Hold colchicine  NEUROLOGIC A:  Acute encephalopathy 2nd to respiratory failure >> improved 11/03. Hx of depression. P:   Continue zoloft  Summary: Improved, but still tenuous.  Keep in SDU.    [name], MD [facility] Pulmonary/Critical Care 04/19/2014, 9:36 AM Pager:  [phone] After 3pm call: [phone]

## 2014-04-19 NOTE — Progress Notes (Signed)
Pt. Is off BIPAP at this time & on 4L [place]. Pt. Has been on & off BIPAP all night. BIPAP is at the bedside on standby if needed.

## 2014-04-19 NOTE — Progress Notes (Signed)
Pt desat to 84% on bipap. Blair Hailey, RT. Says she will be up to see the pt. Increased O2 on bipap from 40% to 60% per RT instruction while waiting on her to get here. Pt O2 sats back up to 93%. Claiborne Billings, RT now at bedside. Will continue to monitor.

## 2014-04-19 NOTE — Progress Notes (Signed)
Pt placed on Bipap for nap for desats and inc WOB.

## 2014-04-20 LAB — GLUCOSE, CAPILLARY
GLUCOSE-CAPILLARY: 115 mg/dL — AB (ref 70–99)
Glucose-Capillary: 120 mg/dL — ABNORMAL HIGH (ref 70–99)
Glucose-Capillary: 104 mg/dL — ABNORMAL HIGH (ref 70–99)
Glucose-Capillary: 181 mg/dL — ABNORMAL HIGH (ref 70–99)

## 2014-04-20 LAB — BASIC METABOLIC PANEL
Anion gap: 11 (ref 5–15)
BUN: 54 mg/dL — AB (ref 6–23)
CO2: 38 meq/L — AB (ref 19–32)
CREATININE: 1.69 mg/dL — AB (ref 0.50–1.10)
Calcium: 9.3 mg/dL (ref 8.4–10.5)
Chloride: 95 mEq/L — ABNORMAL LOW (ref 96–112)
GFR calc Af Amer: 35 mL/min — ABNORMAL LOW (ref >90)
GFR, EST NON AFRICAN AMERICAN: 30 mL/min — AB (ref >90)
Glucose, Bld: 114 mg/dL — ABNORMAL HIGH (ref 70–99)
Potassium: 4.3 mEq/L (ref 3.7–5.3)
Sodium: 144 mEq/L (ref 137–147)

## 2014-04-20 LAB — CBC
HEMATOCRIT: 35.8 % — AB (ref 36.0–46.0)
Hemoglobin: 10.8 g/dL — ABNORMAL LOW (ref 12.0–15.0)
MCH: 27.8 pg (ref 26.0–34.0)
MCHC: 30.2 g/dL (ref 30.0–36.0)
MCV: 92.3 fL (ref 78.0–100.0)
PLATELETS: 236 10*3/uL (ref 150–400)
RBC: 3.88 MIL/uL (ref 3.87–5.11)
RDW: 15.9 % — AB (ref 11.5–15.5)
WBC: 7.1 10*3/uL (ref 4.0–10.5)

## 2014-04-20 LAB — PHOSPHORUS: Phosphorus: 4.1 mg/dL (ref 2.3–4.6)

## 2014-04-20 LAB — MAGNESIUM: Magnesium: 2.2 mg/dL (ref 1.5–2.5)

## 2014-04-20 MED ORDER — BUDESONIDE 0.5 MG/2ML IN SUSP
0.5000 mg | Freq: Two times a day (BID) | RESPIRATORY_TRACT | Status: DC
  Administered 2014-04-20 – 2014-04-21 (×2): 0.5 mg via RESPIRATORY_TRACT
  Filled 2014-04-20 (×4): qty 2

## 2014-04-20 MED ORDER — ALBUTEROL SULFATE (2.5 MG/3ML) 0.083% IN NEBU: 2.5000 mg | INHALATION_SOLUTION | RESPIRATORY_TRACT | Status: DC | PRN

## 2014-04-20 MED ORDER — IPRATROPIUM-ALBUTEROL 0.5-2.5 (3) MG/3ML IN SOLN
3.0000 mL | Freq: Three times a day (TID) | RESPIRATORY_TRACT | Status: DC
  Administered 2014-04-20 – 2014-04-21 (×5): 3 mL via RESPIRATORY_TRACT
  Filled 2014-04-20 (×4): qty 3

## 2014-04-20 MED ORDER — PREDNISONE 20 MG PO TABS
40.0000 mg | ORAL_TABLET | Freq: Every day | ORAL | Status: DC
  Administered 2014-04-20 – 2014-04-21 (×2): 40 mg via ORAL
  Filled 2014-04-20 (×3): qty 2

## 2014-04-20 NOTE — Progress Notes (Signed)
On s/b

## 2014-04-20 NOTE — Clinical Documentation Improvement (Signed)
04/19/14 progr note..."RENAL A:Chronic renal insufficiency (baseline scr 1.15). P:Monitor renal fx, urine outpt, electrolytes"..."ENDOCRINE  A: DM type II with renal complications."... 04/20/14: CREATININE(mg/dL) 1.69H 1.10;  GFR, Est Afr Am(mL/min) 35L   For accurate Dx specificity & severity can noted "Chronic renal insufficiency" be further specified with stage ans condition being eval'd, mon'd and tx'd. Thank you  . Document the stage of CKD --Chronic kidney disease, stage 1- GFR > OR = 90 --Chronic kidney disease, stage 2 (mild) - GFR 60-89 --Chronic kidney disease, stage 3 (moderate) - GFR 30-59 --Chronic kidney disease, stage 4 (severe) - GFR 15-29 --Chronic kidney disease, stage 5- GFR < 15 --End-stage renal disease (ESRD) . Document any underlying cause of CKD such as Diabetes or Hypertension . Chronic renal failure without a documented stage will be assigned to Chronic kidney disease, unspecified . Document any associated diagnoses/conditions  Supporting Information: See above note  Thank You,  Ermelinda Das, RN, BSN, CCDS Certified Clinical Documentation Specialist Pager: [phone] [place]: Health Information Management

## 2014-04-20 NOTE — Progress Notes (Signed)
PULMONARY / CRITICAL CARE MEDICINE   Name: [name] MRN: [id number] DOB: [birth year]    ADMISSION DATE:  04/18/2014  CHIEF COMPLAINT:  "cant breath"   INITIAL PRESENTATION:  68 yo female former smoker admitted from pulmonary office with acute on chronic hypoxic/hypercapnic respiratory failure from AECOPD, and pulmonary edema.  STUDIES:   SIGNIFICANT EVENTS: 11/02 Admit 11/04 Transfer to floor bed  SUBJECTIVE:  Breathing much better.  Doesn't like using BiPAP.  VITAL SIGNS: Temp:  [97.4 F (36.3 C)-98.4 F (36.9 C)] 98.4 F (36.9 C) (11/04 0700) Pulse Rate:  [87-115] 103 (11/04 0812) Resp:  [12-26] 22 (11/04 0812) BP: (106-131)/(70-80) 128/80 mmHg (11/04 0505) SpO2:  [84 %-98 %] 98 % (11/04 0812) FiO2 (%):  [40 %-50 %] 40 % (11/04 0504) Weight:  [180 lb 12.4 oz (82 kg)] 180 lb 12.4 oz (82 kg) (11/04 0625) INTAKE / OUTPUT:  Intake/Output Summary (Last 24 hours) at 04/20/14 1029 Last data filed at 04/20/14 0000  Gross per 24 hour  Intake    240 ml  Output    650 ml  Net   -410 ml    PHYSICAL EXAMINATION: GEN: no distress HEENT: no sinus tenderness RESP: better air movement, no wheeze CARD: regular, no murmur GI: soft, non tender Musco: no edema Neuro: normal strength Skin: no rashes  LABS:  CBC  Recent Labs Lab 04/18/14 2005 04/19/14 0321 04/20/14 0250  WBC 7.9 8.4 7.1  HGB 10.8* 11.1* 10.8*  HCT 35.8* 37.2 35.8*  PLT 215 226 236   BMET  Recent Labs Lab 04/18/14 2005 04/19/14 0321 04/20/14 0250  NA 141 142 144  K 4.1 4.1 4.3  CL 93* 92* 95*  CO2 39* 37* 38*  BUN 24* 25* 54*  CREATININE 1.22* 1.10 1.69*  GLUCOSE 202* 127* 114*   Electrolytes  Recent Labs Lab 04/18/14 2005 04/19/14 0321 04/20/14 0250  CALCIUM 9.8 9.2 9.3  MG  --   --  2.2  PHOS  --   --  4.1   Sepsis Markers  Recent Labs Lab 04/18/14 2005  LATICACIDVEN 0.8  PROCALCITON <0.10   ABG  Recent Labs Lab 04/18/14 1835  PHART 7.267*  PCO2ART 86.6*   PO2ART 66.5*   Liver Enzymes  Recent Labs Lab 04/18/14 2005  AST 21  ALT 21  ALKPHOS 77  BILITOT 0.3  ALBUMIN 3.6   Cardiac Enzymes  Recent Labs Lab 04/18/14 2005  TROPONINI <0.30  PROBNP 554.9*   Glucose  Recent Labs Lab 04/18/14 2200 04/19/14 0753 04/19/14 1115 04/19/14 1657 04/19/14 2124 04/20/14 0733  GLUCAP 187* 141* 137* 122* 152* 120*    Imaging Dg Chest Port 1 View  04/19/2014   CLINICAL DATA:  69 year old female with shortness of breath.  EXAM: PORTABLE CHEST - 1 VIEW  COMPARISON:  04/18/2014 and prior radiographs  FINDINGS: Cardiomegaly, pulmonary vascular congestion and pulmonary edema again noted.  Increasing bilateral lower lung opacities are noted compatible with airspace disease or atelectasis.  There is no evidence of pneumothorax.  No other significant change is noted.  IMPRESSION: Pulmonary edema again noted with increasing bibasilar opacities which may represent airspace disease/edema or atelectasis.   Electronically Signed   By: Hassan Rowan M.D.   On: 04/19/2014 12:39     ASSESSMENT / PLAN:  PULMONARY A: Acute on chronic hypoxic/hypercapnic respiratory failure 2nd to AECOPD and pulmonary edema. P:   Oxygen to keep SpO2 > 90% D.c BiPAP F/u CXR as needed Continue pulmicort and scheduled duoneb Change  to prednisone 40 mg daily on 11/04  CARDIOVASCULAR A:  Acute on chronic diastolic heart failure with hx of HTN. Hx of HLD. P:  Goal even fluid balance Continue hydralazine Hold HCTZ, cozaar for now Continue lovastatin >> pravastatin while in hospital  RENAL A:   Chronic renal insufficiency (baseline scr 1.15). P:   Monitor renal fx, urine outpt, electrolytes Hold further diuresis for now  GASTROINTESTINAL A:  Hx of GERD. P:   PPI  Continue Xifaxan   HEMATOLOGIC A:   Mild anemia. P:  F/u CBC SQ heparin for DVT prevention  INFECTIOUS A:   AECOPD. P:   Day 3 of Levaquin, started11/2  ENDOCRINE A:   DM type II with  renal complications. Hx of gout. P:   SSI  Hold glucophage in setting of elevated creatinine Hold colchicine  NEUROLOGIC A:  Acute encephalopathy 2nd to respiratory failure >> resolved. Hx of depression. P:   Continue zoloft  Summary: Transfer to floor bed 11/04.  If stable, then possible d/c home 11/05.  [name], MD [facility] Pulmonary/Critical Care 04/20/2014, 10:29 AM Pager:  [phone] After 3pm call: [phone]

## 2014-04-21 LAB — BASIC METABOLIC PANEL
ANION GAP: 11 (ref 5–15)
BUN: 57 mg/dL — AB (ref 6–23)
CHLORIDE: 95 meq/L — AB (ref 96–112)
CO2: 37 mEq/L — ABNORMAL HIGH (ref 19–32)
CREATININE: 1.55 mg/dL — AB (ref 0.50–1.10)
Calcium: 9.1 mg/dL (ref 8.4–10.5)
GFR, EST AFRICAN AMERICAN: 39 mL/min — AB (ref >90)
GFR, EST NON AFRICAN AMERICAN: 33 mL/min — AB (ref >90)
Glucose, Bld: 112 mg/dL — ABNORMAL HIGH (ref 70–99)
Potassium: 3.8 mEq/L (ref 3.7–5.3)
Sodium: 143 mEq/L (ref 137–147)

## 2014-04-21 LAB — CBC
HEMATOCRIT: 35.2 % — AB (ref 36.0–46.0)
Hemoglobin: 10.9 g/dL — ABNORMAL LOW (ref 12.0–15.0)
MCH: 27.6 pg (ref 26.0–34.0)
MCHC: 31 g/dL (ref 30.0–36.0)
MCV: 89.1 fL (ref 78.0–100.0)
PLATELETS: 259 10*3/uL (ref 150–400)
RBC: 3.95 MIL/uL (ref 3.87–5.11)
RDW: 15.6 % — AB (ref 11.5–15.5)
WBC: 7.7 10*3/uL (ref 4.0–10.5)

## 2014-04-21 LAB — GLUCOSE, CAPILLARY
GLUCOSE-CAPILLARY: 175 mg/dL — AB (ref 70–99)
Glucose-Capillary: 117 mg/dL — ABNORMAL HIGH (ref 70–99)

## 2014-04-21 MED ORDER — GUAIFENESIN ER 600 MG PO TB12
1200.0000 mg | ORAL_TABLET | Freq: Two times a day (BID) | ORAL | Status: DC
  Administered 2014-04-21: 1200 mg via ORAL
  Filled 2014-04-21 (×2): qty 2

## 2014-04-21 MED ORDER — GUAIFENESIN ER 600 MG PO TB12: 1200.0000 mg | ORAL_TABLET | Freq: Two times a day (BID) | ORAL | Status: DC

## 2014-04-21 MED ORDER — RIFAXIMIN 550 MG PO TABS: 550.0000 mg | ORAL_TABLET | Freq: Three times a day (TID) | ORAL | Status: DC

## 2014-04-21 MED ORDER — LEVOFLOXACIN 750 MG PO TABS: 750.0000 mg | ORAL_TABLET | ORAL | Status: DC

## 2014-04-21 MED ORDER — PREDNISONE 10 MG PO TABS: ORAL_TABLET | ORAL | Status: DC

## 2014-04-21 NOTE — Discharge Summary (Signed)
Physician Discharge Summary  Patient ID: [name] MRN: [id number] DOB/AGE: 1945-11-20 68 y.o.  Admit date: 04/18/2014 Discharge date: 04/21/2014    Discharge Diagnoses:  Active Problems:   Acute on chronic respiratory failure    Hospital course: [name] is a 68 y.o. y/o female former smoker with complex PMH including HTN, DM, GERD, severe COPD/O2 dependent who presented to pulmonary office 11/2 for acute OV with c/o progressive SOB, cough, congestion and wheezing.  She was unable to get out of her car in the parking lot.  O2 sats were 71% on her home [facility] and she was mildly lethargic.  She was directly admitted to [facility] with acute on chronic respiratory failure r/t AECOPD +/- fluid overload.   She was admitted to SDU and treated with IV lasix, bipap, IV steroids, nebulized BD's, nebulized steroids, increased supplemental O2 and IV antibiotics.  Cardiac w/u was essentially negative and pt improved quickly with the above therapies.  She was tx to med-surg floor 11/4, steroids tapered and changed to PO.  She did have a small bump in SCr with lasix, now improved and near her baseline Scr.  At the time of d/c, pt is much improved, respiratory status is back to baseline and pt is tolerating activity with her baseline O2 requirement.  She is medically stable and ready for d/c pending close outpt f/u.    Lines/tubes: none   Microbiology/Sepsis markers: none  Significant Diagnostic Studies:  none                                                                    Discharge Plan by Discharge Diagnosis  Acute on chronic hypoxic/hypercapnic respiratory failure 2nd to AECOPD and pulmonary edema. P:  Supplemental O2 - 2L [place] Outpt pulm f/u - arranged  Resume home symbicort, spiriva  Continue PO prednisone with taper  Add mucinex Continue levaquin to complete 7 day course    Acute on chronic diastolic heart failure with hx of HTN. Hx of HLD. P:  Continue  hydralazine Hold HCTZ, cozaar for now >>>re-assess when to resume as outpt  Continue lovastatin  Consider outpt f/u with cards  Consider echo as outpt    Chronic renal insufficiency (baseline scr 1.15). P:  outpt chem    Hx of GERD. P:  PPI  Continue Xifaxan    Mild anemia. P:  F/u CBC   DM type II with renal complications. Hx of gout. P:  SSI >> only needed 4 units novolog in last 24 hours >> will not need insulin at home once prednisone dose decreased Hold glucophage in setting of elevated creatinine >> re-assess DM regimen as outpt with PCP Hold colchicine >> re-assess as outpt with PCP   Acute encephalopathy 2nd to respiratory failure >> resolved. Hx of depression. P:  Continue zoloft   Filed Vitals:   04/20/14 2109 04/20/14 2223 04/21/14 0636 04/21/14 0846  BP:  135/76 129/85   Pulse:  102 89   Temp:  98.7 F (37.1 C) 98.5 F (36.9 C)   TempSrc:  Oral Oral   Resp:  22 24   Height:      Weight:   180 lb 8.9 oz (81.9 kg)   SpO2: 97% 93% 100% 100%  Discharge Labs  BMET  Recent Labs Lab 04/18/14 2005 04/19/14 0321 04/20/14 0250 04/21/14 0516  NA 141 142 144 143  K 4.1 4.1 4.3 3.8  CL 93* 92* 95* 95*  CO2 39* 37* 38* 37*  GLUCOSE 202* 127* 114* 112*  BUN 24* 25* 54* 57*  CREATININE 1.22* 1.10 1.69* 1.55*  CALCIUM 9.8 9.2 9.3 9.1  MG  --   --  2.2  --   PHOS  --   --  4.1  --      CBC   Recent Labs Lab 04/19/14 0321 04/20/14 0250 04/21/14 0516  HGB 11.1* 10.8* 10.9*  HCT 37.2 35.8* 35.2*  WBC 8.4 7.1 7.7  PLT 226 236 259   Anti-Coagulation No results for input(s): INR in the last 168 hours.           Follow-up Information    Follow up with REED, [name], DO. Schedule an appointment as soon as possible for a visit in 1 week.   Specialty:  Geriatric Medicine   Contact information:   [address] [place] [address] [phone]       Follow up with PARRETT,TAMMY, NP On 04/27/2014.   Specialty:   Nurse Practitioner   Why:  11:00am    Contact information:   [address] [place] [address] [phone]          Medication List    STOP taking these medications        colchicine 0.6 MG tablet     hydrochlorothiazide 25 MG tablet  Commonly known as:  HYDRODIURIL     losartan 100 MG tablet  Commonly known as:  COZAAR     metFORMIN 1000 MG tablet  Commonly known as:  GLUCOPHAGE      TAKE these medications        albuterol (2.5 MG/3ML) 0.083% nebulizer solution  Commonly known as:  PROVENTIL  Take 2.5 mg by nebulization every 6 (six) hours as needed for shortness of breath.     albuterol 108 (90 BASE) MCG/ACT inhaler  Commonly known as:  PROAIR HFA  Inhale 2 puffs into the lungs every 4 (four) hours as needed for shortness of breath.     budesonide-formoterol 80-4.5 MCG/ACT inhaler  Commonly known as:  SYMBICORT  Inhale 2 puffs into the lungs 2 (two) times daily.     guaiFENesin 600 MG 12 hr tablet  Commonly known as:  MUCINEX  Take 2 tablets (1,200 mg total) by mouth 2 (two) times daily.     hydrALAZINE 25 MG tablet  Commonly known as:  APRESOLINE  Take 1 tablet (25 mg total) by mouth every 8 (eight) hours.     levofloxacin 750 MG tablet  Commonly known as:  LEVAQUIN  Take 1 tablet (750 mg total) by mouth every other day.     lovastatin 40 MG tablet  Commonly known as:  MEVACOR  Take 1 tablet (40 mg total) by mouth at bedtime. For hyperlipedemia     MULTIVITAMIN ADULTS 50+ Tabs  Take 1 tablet by mouth daily.     pantoprazole 40 MG tablet  Commonly known as:  PROTONIX  Take 1 tablet (40 mg total) by mouth daily.     predniSONE 10 MG tablet  Commonly known as:  DELTASONE  Take 4 tabs PO daily x 2 days, then 3 tabs PO daily x 2 days, then 2 tabs PO daily x 2 days, then 1 tab PO daily x 2 days then,  STOP     promethazine-codeine 6.25-10 MG/5ML syrup  Commonly known as:  PHENERGAN with CODEINE  Take 5 mLs by mouth every 6 (six) hours as needed  for cough.     rifaximin 550 MG Tabs tablet  Commonly known as:  XIFAXAN  Take 1 tablet (550 mg total) by mouth 3 (three) times daily.     sertraline 50 MG tablet  Commonly known as:  ZOLOFT  Take 1 tablet (50 mg total) by mouth at bedtime.     tiotropium 18 MCG inhalation capsule  Commonly known as:  SPIRIVA  Place 1 capsule (18 mcg total) into inhaler and inhale daily.          Disposition: 01-Home or Self Care  Discharged Condition: AZARYA OCONNELL has met maximum benefit of inpatient care and is medically stable and cleared for discharge.  Patient is pending follow up as above.      Time spent on disposition:  Greater than 35 minutes.   SignedDarlina Sicilian, NP 04/21/2014  1:21 PM Pager: [phone] or [phone]       [name], MD [facility] 04/21/2014, 3:15 PM Pager:  [phone] After 3pm call: [phone]

## 2014-04-21 NOTE — Progress Notes (Signed)
PULMONARY / CRITICAL CARE MEDICINE   Name: [name] MRN: [id number] DOB: Oct 20, 1945    ADMISSION DATE:  04/18/2014  CHIEF COMPLAINT:  "cant breath"   INITIAL PRESENTATION:  68 yo female former smoker admitted from pulmonary office with acute on chronic hypoxic/hypercapnic respiratory failure from AECOPD, and pulmonary edema.  STUDIES:   SIGNIFICANT EVENTS: 11/02 Admit 11/04 Transfer to floor bed  SUBJECTIVE:  Breathing much better at rest, near baseline.  Has not ambulated.  Still more SOB with activity (bedside commode) than baseline.  Thick secretions.   VITAL SIGNS: Temp:  [98.4 F (36.9 C)-99.2 F (37.3 C)] 98.5 F (36.9 C) (11/05 0636) Pulse Rate:  [89-102] 89 (11/05 0636) Resp:  [20-28] 24 (11/05 0636) BP: (129-142)/(76-87) 129/85 mmHg (11/05 0636) SpO2:  [92 %-100 %] 100 % (11/05 0636) Weight:  [180 lb 8.9 oz (81.9 kg)] 180 lb 8.9 oz (81.9 kg) (11/05 0636) INTAKE / OUTPUT:  Intake/Output Summary (Last 24 hours) at 04/21/14 0837 Last data filed at 04/21/14 0644  Gross per 24 hour  Intake    120 ml  Output    200 ml  Net    -80 ml    PHYSICAL EXAMINATION: GEN: no distress HEENT: no sinus tenderness RESP: resps even, non labored, speaks in full sentences,diminshed, mild exp wheeze CARD: regular, no murmur GI: soft, non tender Musco: no edema Neuro: normal strength Skin: no rashes  LABS:  CBC  Recent Labs Lab 04/19/14 0321 04/20/14 0250 04/21/14 0516  WBC 8.4 7.1 7.7  HGB 11.1* 10.8* 10.9*  HCT 37.2 35.8* 35.2*  PLT 226 236 259   BMET  Recent Labs Lab 04/19/14 0321 04/20/14 0250 04/21/14 0516  NA 142 144 143  K 4.1 4.3 3.8  CL 92* 95* 95*  CO2 37* 38* 37*  BUN 25* 54* 57*  CREATININE 1.10 1.69* 1.55*  GLUCOSE 127* 114* 112*   Electrolytes  Recent Labs Lab 04/19/14 0321 04/20/14 0250 04/21/14 0516  CALCIUM 9.2 9.3 9.1  MG  --  2.2  --   PHOS  --  4.1  --    Sepsis Markers  Recent Labs Lab 04/18/14 2005   LATICACIDVEN 0.8  PROCALCITON <0.10   ABG  Recent Labs Lab 04/18/14 1835  PHART 7.267*  PCO2ART 86.6*  PO2ART 66.5*   Liver Enzymes  Recent Labs Lab 04/18/14 2005  AST 21  ALT 21  ALKPHOS 77  BILITOT 0.3  ALBUMIN 3.6   Cardiac Enzymes  Recent Labs Lab 04/18/14 2005  TROPONINI <0.30  PROBNP 554.9*   Glucose  Recent Labs Lab 04/19/14 2124 04/20/14 0733 04/20/14 1259 04/20/14 1717 04/20/14 2226 04/21/14 0803  GLUCAP 152* 120* 115* 104* 181* 117*    Imaging No results found.   ASSESSMENT / PLAN:  PULMONARY A: Acute on chronic hypoxic/hypercapnic respiratory failure 2nd to AECOPD and pulmonary edema. P:   Oxygen to keep SpO2 > 90% Continue pulmicort and scheduled duoneb Continue PO prednisone 40 mg daily (started 11/4) >> wean off over next one week as tolerated Add mucinex, flutter  Ambulate   CARDIOVASCULAR A:  Acute on chronic diastolic heart failure with hx of HTN. Hx of HLD. P:  Goal even fluid balance Continue hydralazine Hold HCTZ, cozaar for now >> re-assess when to resume as outpt Continue lovastatin >> pravastatin while in hospital  RENAL A:   Chronic renal insufficiency (baseline scr 1.15). P:   Monitor renal fx, urine outpt, electrolytes Hold further diuresis for now  GASTROINTESTINAL A:  Hx of GERD. P:   PPI  Continue Xifaxan   HEMATOLOGIC A:   Mild anemia. P:  F/u CBC SQ heparin for DVT prevention  INFECTIOUS A:   AECOPD. P:   Day 4/7 of Levaquin, started11/2  ENDOCRINE A:   DM type II with renal complications. Hx of gout. P:   SSI >> only needed 4 units novolog in last 24 hours >> will not need insulin at home once prednisone dose decreased Hold glucophage in setting of elevated creatinine >> re-assess DM regimen as outpt with PCP Hold colchicine >> re-assess as outpt with PCP  NEUROLOGIC A:  Acute encephalopathy 2nd to respiratory failure >> resolved. Hx of depression. P:   Continue  zoloft  Nickolas Madrid, NP 04/21/2014  8:38 AM Pager: [phone] or [phone]   Reviewed above, examined.  Breathing better.  Walked in hall >> dyspnea at baseline level.  No wheeze on exam.  She is ready for d/c home today.  She will need f/u with Tammy Parrett early next week in pulmonary office, and then f/u with Dr. Chase Caller.  [name], MD [facility] Pulmonary/Critical Care 04/21/2014, 11:52 AM Pager:  [phone] After 3pm call: [phone]

## 2014-04-25 ENCOUNTER — Other Ambulatory Visit: Payer: Medicare Other

## 2014-04-25 ENCOUNTER — Telehealth: Payer: Self-pay | Admitting: Internal Medicine

## 2014-04-25 NOTE — Telephone Encounter (Signed)
Called spoke with patient who reports a bumpy rash on her bottom and on the inside of her bottom lip that is 'real white.'  Her mouth is very dry and is more fatigued than normal ("I've been sleeping all day").  Pt reported that she called the on-call service at 2am today after she woke up with the sensation that her throat was closing, but wonders if this was from sleeping with her mouth open.  Pt stated that she did go back to sleep after calling the on-call service and denies any sensation that her throat is closing or tight at this time.  Pt was discharged on 11.5.15 with 2 tablets of Levaquin.  Pt reported these symptoms after taking the first dose yesterday 11.8.15.  Pt has not taken the 2nd dose of Levaquin and has not taken any of her maintenance meds other than neb treatments and Symbicort this morning.  Advised pt will speak with TP and call her back directly.  Discussed with TP: may stop the Levaquin.  IF she experiences any throat closing or tightness, she MUST call 911 immediately!  Contact PCP regarding the rash on her bottom.  Called spoke with patient and discussed TP's recs.  Pt again stated that she has not experienced any more sensations that her throat is closing or tightness and denies this presently as well.  She does verbalize her understanding to seek emergency attention if this changes.  Pt advised to restart her maintenance medications.  She will keep her 11.11.15 HFU w/ TP and contact her PCP regarding the rash.  Nothing further needed at this time; will sign off.

## 2014-04-27 ENCOUNTER — Ambulatory Visit (INDEPENDENT_AMBULATORY_CARE_PROVIDER_SITE_OTHER): Payer: Medicare Other | Admitting: Adult Health

## 2014-04-27 ENCOUNTER — Encounter: Payer: Self-pay | Admitting: Adult Health

## 2014-04-27 VITALS — BP 134/74 | HR 90 | Temp 98.1°F | Ht 62.5 in | Wt 187.2 lb

## 2014-04-27 DIAGNOSIS — J449 Chronic obstructive pulmonary disease, unspecified: Secondary | ICD-10-CM

## 2014-04-27 DIAGNOSIS — J441 Chronic obstructive pulmonary disease with (acute) exacerbation: Secondary | ICD-10-CM

## 2014-04-27 DIAGNOSIS — J9611 Chronic respiratory failure with hypoxia: Secondary | ICD-10-CM

## 2014-04-27 MED ORDER — ALBUTEROL SULFATE HFA 108 (90 BASE) MCG/ACT IN AERS: 2.0000 | INHALATION_SPRAY | RESPIRATORY_TRACT | Status: DC | PRN

## 2014-04-27 NOTE — Assessment & Plan Note (Signed)
Controlled , recent med changes during hospital stay  Needs ov with PCP , pt to make ov with PCP w/in 1 week.

## 2014-04-27 NOTE — Patient Instructions (Addendum)
Need to follow up with Primary Care doctor in the next week.  Continue Spiriva 1 puff daily Symbicort 80/4.5, 2 puff 2 times daily; Continue oxygen as before Mucinex DM Twice daily  As needed  Cough/congestion   #Followup - 2  months with COPD cat score with DR Ramsawamy  Please contact office for sooner follow up if symptoms do not improve or worsen or seek emergency care

## 2014-04-27 NOTE — Assessment & Plan Note (Addendum)
Exacerbation +/- diastolic dysfunction >now resolved. Refer to Cardiology for suspected CHF-Diastolic    Plan  Continue Spiriva 1 puff daily Symbicort 80/4.5, 2 puff 2 times daily; Continue oxygen as before Mucinex DM Twice daily  As needed  Cough/congestion   #Followup - 2  months with COPD cat score with DR Ramsawamy  Please contact office for sooner follow up if symptoms do not improve or worsen or seek emergency care

## 2014-04-27 NOTE — Assessment & Plan Note (Addendum)
Recent COPD exacerbation, now resolved. Suspected diastolic decompensation , refer to cardiology for evaluation and consideration of 2 D echo  Patient is clinically improving. She is to continue on her current regimen  Follow up in 2 months with Dr. Chase Caller

## 2014-04-27 NOTE — Assessment & Plan Note (Signed)
Recent decompensation now back to baseline Cont on O2

## 2014-04-27 NOTE — Progress Notes (Signed)
   Subjective:    Patient ID: [name], female    DOB: October 01, 1945, 68 y.o.   MRN: [id number]  HPI Severe COPD -O2 depenedent (advair causes epistaxis)  chronic respiratory failure- MM phenotype Nov 2012  - Spirometry Nov 2012: Fev1 0.85L/45%, Ratio  =- COPD based on age, spiormetry and smoking hx and emphysema on CT 2014  04/27/2014 [facility] follow up  Patient presents for a post hospital follow-up. Patient was admitted November 2 through November 5 for acute exacerbation and pulmonary edema with acute on chronic diastolic heart failure decompensation.she was treated with antibiotics, steroids, and nebulized bronchodilators. Discharged on steroid taper and Levaquin. Did not take the steroids.  Felt levaquin caused rash and throat tightness.  Patient says since discharge. She is feeling much improved with decreased cough, congestion and shortness of breath. Patient was recommended to hold her colchicine, Cozaar ,HCTZ and Glucophage due to renal insufficiency and discuss with her primary care physician. However, patient did not follow these instructions and is continued on these .  Remains on Symbcort and Spiriva .  Remains on 2lm /of O2  She denies chest pain, orthopnea , edema or fever.    Review of Systems .Constitutional:   No  weight loss, night sweats,  Fevers, chills, + fatigue, or  lassitude.  HEENT:   No headaches,  Difficulty swallowing,  Tooth/dental problems, or  Sore throat,                No sneezing, itching, ear ache, nasal congestion, post nasal drip,   CV:  No chest pain,  Orthopnea, PND, swelling in lower extremities, anasarca, dizziness, palpitations, syncope.   GI  No heartburn, indigestion, abdominal pain, nausea, vomiting, diarrhea, change in bowel habits, loss of appetite, bloody stools.   Resp:  No chest wall deformity  Skin: no rash or lesions.  GU: no dysuria, change in color of urine, no urgency or frequency.  No flank pain, no hematuria     MS:  No joint pain or swelling.  No decreased range of motion.  No back pain.  Psych:  No change in mood or affect. No depression or anxiety.  No memory loss.         Objective:   Physical Exam GEN: A/Ox3; pleasant , alert   HEENT:  [place]/AT,  EACs-clear, TMs-wnl, NOSE-clear, THROAT-clear, no lesions, no postnasal drip or exudate noted.   NECK:  Supple w/ fair ROM; no JVD; normal carotid impulses w/o bruits; no thyromegaly or nodules palpated; no lymphadenopathy.  RESP  Decreased BS in bases .no accessory muscle use, no dullness to percussion  CARD: ST , no m/r/g  , 1+ peripheral edema, pulses intact, no cyanosis or clubbing.  GI:   Soft & nt; nml bowel sounds; no organomegaly or masses detected.  Musco: Warm bil, no deformities or joint swelling noted.   Neuro: alert, no focal deficits noted.    Skin: Warm, no lesions or rashes         Assessment & Plan:

## 2014-04-28 ENCOUNTER — Ambulatory Visit: Payer: Medicare Other | Admitting: Internal Medicine

## 2014-05-01 ENCOUNTER — Emergency Department (HOSPITAL_COMMUNITY): Payer: Medicare Other

## 2014-05-01 ENCOUNTER — Inpatient Hospital Stay (HOSPITAL_COMMUNITY)
Admission: EM | Admit: 2014-05-01 | Discharge: 2014-05-17 | DRG: 602 | Disposition: A | Discharge status: Skilled Nursing Facility | Payer: Medicare Other | Attending: Emergency Medicine | Admitting: Emergency Medicine

## 2014-05-01 ENCOUNTER — Encounter (HOSPITAL_COMMUNITY): Payer: Self-pay | Admitting: *Deleted

## 2014-05-01 DIAGNOSIS — J9621 Acute and chronic respiratory failure with hypoxia: Secondary | ICD-10-CM | POA: Diagnosis present

## 2014-05-01 DIAGNOSIS — R579 Shock, unspecified: Secondary | ICD-10-CM | POA: Insufficient documentation

## 2014-05-01 DIAGNOSIS — D638 Anemia in other chronic diseases classified elsewhere: Secondary | ICD-10-CM | POA: Diagnosis present

## 2014-05-01 DIAGNOSIS — J45909 Unspecified asthma, uncomplicated: Secondary | ICD-10-CM | POA: Diagnosis present

## 2014-05-01 DIAGNOSIS — E876 Hypokalemia: Secondary | ICD-10-CM | POA: Diagnosis present

## 2014-05-01 DIAGNOSIS — F410 Panic disorder [episodic paroxysmal anxiety] without agoraphobia: Secondary | ICD-10-CM | POA: Diagnosis present

## 2014-05-01 DIAGNOSIS — Z452 Encounter for adjustment and management of vascular access device: Secondary | ICD-10-CM

## 2014-05-01 DIAGNOSIS — J939 Pneumothorax, unspecified: Secondary | ICD-10-CM

## 2014-05-01 DIAGNOSIS — M109 Gout, unspecified: Secondary | ICD-10-CM | POA: Diagnosis present

## 2014-05-01 DIAGNOSIS — E1165 Type 2 diabetes mellitus with hyperglycemia: Secondary | ICD-10-CM | POA: Diagnosis present

## 2014-05-01 DIAGNOSIS — J9691 Respiratory failure, unspecified with hypoxia: Secondary | ICD-10-CM

## 2014-05-01 DIAGNOSIS — G9341 Metabolic encephalopathy: Secondary | ICD-10-CM | POA: Diagnosis present

## 2014-05-01 DIAGNOSIS — R0602 Shortness of breath: Secondary | ICD-10-CM

## 2014-05-01 DIAGNOSIS — I5030 Unspecified diastolic (congestive) heart failure: Secondary | ICD-10-CM | POA: Diagnosis present

## 2014-05-01 DIAGNOSIS — Y848 Other medical procedures as the cause of abnormal reaction of the patient, or of later complication, without mention of misadventure at the time of the procedure: Secondary | ICD-10-CM | POA: Diagnosis present

## 2014-05-01 DIAGNOSIS — T380X5A Adverse effect of glucocorticoids and synthetic analogues, initial encounter: Secondary | ICD-10-CM | POA: Diagnosis present

## 2014-05-01 DIAGNOSIS — Z6834 Body mass index (BMI) 34.0-34.9, adult: Secondary | ICD-10-CM

## 2014-05-01 DIAGNOSIS — J449 Chronic obstructive pulmonary disease, unspecified: Secondary | ICD-10-CM

## 2014-05-01 DIAGNOSIS — M7989 Other specified soft tissue disorders: Secondary | ICD-10-CM

## 2014-05-01 DIAGNOSIS — L03116 Cellulitis of left lower limb: Secondary | ICD-10-CM | POA: Diagnosis not present

## 2014-05-01 DIAGNOSIS — I313 Pericardial effusion (noninflammatory): Secondary | ICD-10-CM | POA: Diagnosis present

## 2014-05-01 DIAGNOSIS — E119 Type 2 diabetes mellitus without complications: Secondary | ICD-10-CM | POA: Diagnosis present

## 2014-05-01 DIAGNOSIS — I10 Essential (primary) hypertension: Secondary | ICD-10-CM | POA: Diagnosis present

## 2014-05-01 DIAGNOSIS — F419 Anxiety disorder, unspecified: Secondary | ICD-10-CM | POA: Diagnosis present

## 2014-05-01 DIAGNOSIS — J9600 Acute respiratory failure, unspecified whether with hypoxia or hypercapnia: Secondary | ICD-10-CM

## 2014-05-01 DIAGNOSIS — I272 Other secondary pulmonary hypertension: Secondary | ICD-10-CM | POA: Diagnosis present

## 2014-05-01 DIAGNOSIS — N179 Acute kidney failure, unspecified: Secondary | ICD-10-CM | POA: Diagnosis present

## 2014-05-01 DIAGNOSIS — Z87891 Personal history of nicotine dependence: Secondary | ICD-10-CM

## 2014-05-01 DIAGNOSIS — Z9981 Dependence on supplemental oxygen: Secondary | ICD-10-CM

## 2014-05-01 DIAGNOSIS — Z01818 Encounter for other preprocedural examination: Secondary | ICD-10-CM

## 2014-05-01 DIAGNOSIS — I3139 Other pericardial effusion (noninflammatory): Secondary | ICD-10-CM

## 2014-05-01 DIAGNOSIS — J441 Chronic obstructive pulmonary disease with (acute) exacerbation: Secondary | ICD-10-CM | POA: Diagnosis present

## 2014-05-01 DIAGNOSIS — F329 Major depressive disorder, single episode, unspecified: Secondary | ICD-10-CM | POA: Diagnosis present

## 2014-05-01 DIAGNOSIS — J9690 Respiratory failure, unspecified, unspecified whether with hypoxia or hypercapnia: Secondary | ICD-10-CM

## 2014-05-01 DIAGNOSIS — K219 Gastro-esophageal reflux disease without esophagitis: Secondary | ICD-10-CM | POA: Diagnosis present

## 2014-05-01 DIAGNOSIS — Z4659 Encounter for fitting and adjustment of other gastrointestinal appliance and device: Secondary | ICD-10-CM

## 2014-05-01 DIAGNOSIS — E1149 Type 2 diabetes mellitus with other diabetic neurological complication: Secondary | ICD-10-CM | POA: Diagnosis present

## 2014-05-01 DIAGNOSIS — E785 Hyperlipidemia, unspecified: Secondary | ICD-10-CM | POA: Diagnosis present

## 2014-05-01 DIAGNOSIS — R3915 Urgency of urination: Secondary | ICD-10-CM | POA: Diagnosis present

## 2014-05-01 DIAGNOSIS — Z880 Allergy status to penicillin: Secondary | ICD-10-CM

## 2014-05-01 DIAGNOSIS — J9611 Chronic respiratory failure with hypoxia: Secondary | ICD-10-CM | POA: Diagnosis present

## 2014-05-01 DIAGNOSIS — R000 Tachycardia, unspecified: Secondary | ICD-10-CM | POA: Diagnosis present

## 2014-05-01 DIAGNOSIS — E669 Obesity, unspecified: Secondary | ICD-10-CM | POA: Diagnosis present

## 2014-05-01 LAB — COMPREHENSIVE METABOLIC PANEL
ALT: 17 U/L (ref 0–35)
ANION GAP: 10 (ref 5–15)
AST: 16 U/L (ref 0–37)
Albumin: 3.3 g/dL — ABNORMAL LOW (ref 3.5–5.2)
Alkaline Phosphatase: 70 U/L (ref 39–117)
BUN: 19 mg/dL (ref 6–23)
CHLORIDE: 96 meq/L (ref 96–112)
CO2: 34 meq/L — AB (ref 19–32)
CREATININE: 1 mg/dL (ref 0.50–1.10)
Calcium: 9 mg/dL (ref 8.4–10.5)
GFR, EST AFRICAN AMERICAN: 66 mL/min — AB (ref >90)
GFR, EST NON AFRICAN AMERICAN: 57 mL/min — AB (ref >90)
GLUCOSE: 126 mg/dL — AB (ref 70–99)
Potassium: 3.8 mEq/L (ref 3.7–5.3)
Sodium: 140 mEq/L (ref 137–147)
Total Bilirubin: <0.2 mg/dL — ABNORMAL LOW (ref 0.3–1.2)
Total Protein: 7 g/dL (ref 6.0–8.3)

## 2014-05-01 LAB — CBC WITH DIFFERENTIAL/PLATELET
Basophils Absolute: 0 10*3/uL (ref 0.0–0.1)
Basophils Relative: 0 % (ref 0–1)
Eosinophils Absolute: 0.1 10*3/uL (ref 0.0–0.7)
Eosinophils Relative: 1 % (ref 0–5)
HEMATOCRIT: 34.1 % — AB (ref 36.0–46.0)
HEMOGLOBIN: 10.4 g/dL — AB (ref 12.0–15.0)
LYMPHS ABS: 1.4 10*3/uL (ref 0.7–4.0)
LYMPHS PCT: 13 % (ref 12–46)
MCH: 28.3 pg (ref 26.0–34.0)
MCHC: 30.5 g/dL (ref 30.0–36.0)
MCV: 92.9 fL (ref 78.0–100.0)
MONO ABS: 0.8 10*3/uL (ref 0.1–1.0)
Monocytes Relative: 7 % (ref 3–12)
NEUTROS ABS: 8.9 10*3/uL — AB (ref 1.7–7.7)
Neutrophils Relative %: 79 % — ABNORMAL HIGH (ref 43–77)
Platelets: 250 10*3/uL (ref 150–400)
RBC: 3.67 MIL/uL — ABNORMAL LOW (ref 3.87–5.11)
RDW: 15.7 % — ABNORMAL HIGH (ref 11.5–15.5)
WBC: 11.3 10*3/uL — AB (ref 4.0–10.5)

## 2014-05-01 LAB — I-STAT CG4 LACTIC ACID, ED

## 2014-05-01 MED ORDER — BUDESONIDE-FORMOTEROL FUMARATE 80-4.5 MCG/ACT IN AERO
2.0000 | INHALATION_SPRAY | Freq: Two times a day (BID) | RESPIRATORY_TRACT | Status: DC
  Administered 2014-05-01 – 2014-05-03 (×5): 2 via RESPIRATORY_TRACT
  Filled 2014-05-01: qty 6.9

## 2014-05-01 MED ORDER — HEPARIN SODIUM (PORCINE) 5000 UNIT/ML IJ SOLN
5000.0000 [IU] | Freq: Three times a day (TID) | INTRAMUSCULAR | Status: DC
  Administered 2014-05-01 – 2014-05-09 (×25): 5000 [IU] via SUBCUTANEOUS
  Filled 2014-05-01 (×30): qty 1

## 2014-05-01 MED ORDER — ALBUTEROL SULFATE HFA 108 (90 BASE) MCG/ACT IN AERS: 2.0000 | INHALATION_SPRAY | RESPIRATORY_TRACT | Status: DC | PRN

## 2014-05-01 MED ORDER — PROMETHAZINE-CODEINE 6.25-10 MG/5ML PO SYRP
5.0000 mL | ORAL_SOLUTION | Freq: Four times a day (QID) | ORAL | Status: DC | PRN
  Administered 2014-05-02 – 2014-05-03 (×2): 5 mL via ORAL
  Filled 2014-05-01 (×5): qty 5

## 2014-05-01 MED ORDER — SERTRALINE HCL 50 MG PO TABS
50.0000 mg | ORAL_TABLET | Freq: Every day | ORAL | Status: DC
  Administered 2014-05-02 – 2014-05-03 (×3): 50 mg via ORAL
  Filled 2014-05-01 (×4): qty 1

## 2014-05-01 MED ORDER — SODIUM CHLORIDE 0.9 % IV BOLUS (SEPSIS)
1000.0000 mL | Freq: Once | INTRAVENOUS | Status: AC
  Administered 2014-05-01: 1000 mL via INTRAVENOUS

## 2014-05-01 MED ORDER — SODIUM CHLORIDE 0.9 % IJ SOLN
3.0000 mL | Freq: Two times a day (BID) | INTRAMUSCULAR | Status: DC
  Administered 2014-05-01 – 2014-05-09 (×12): 3 mL via INTRAVENOUS

## 2014-05-01 MED ORDER — SODIUM CHLORIDE 0.9 % IV SOLN
INTRAVENOUS | Status: DC
  Administered 2014-05-01 – 2014-05-03 (×3): via INTRAVENOUS
  Administered 2014-05-03: 75 mL/h via INTRAVENOUS
  Administered 2014-05-06: 1000 mL via INTRAVENOUS

## 2014-05-01 MED ORDER — HYDRALAZINE HCL 25 MG PO TABS
25.0000 mg | ORAL_TABLET | Freq: Three times a day (TID) | ORAL | Status: DC
  Administered 2014-05-01 – 2014-05-03 (×7): 25 mg via ORAL
  Filled 2014-05-01 (×12): qty 1

## 2014-05-01 MED ORDER — PANTOPRAZOLE SODIUM 40 MG PO TBEC
40.0000 mg | DELAYED_RELEASE_TABLET | Freq: Every day | ORAL | Status: DC
  Administered 2014-05-02 – 2014-05-03 (×2): 40 mg via ORAL
  Filled 2014-05-01 (×3): qty 1

## 2014-05-01 MED ORDER — ADULT MULTIVITAMIN W/MINERALS CH
1.0000 | ORAL_TABLET | Freq: Every day | ORAL | Status: DC
  Administered 2014-05-02 – 2014-05-03 (×2): 1 via ORAL
  Filled 2014-05-01 (×3): qty 1

## 2014-05-01 MED ORDER — CLINDAMYCIN PHOSPHATE 600 MG/50ML IV SOLN
600.0000 mg | Freq: Three times a day (TID) | INTRAVENOUS | Status: DC
  Filled 2014-05-01: qty 50

## 2014-05-01 MED ORDER — CLINDAMYCIN PHOSPHATE 600 MG/50ML IV SOLN
600.0000 mg | Freq: Once | INTRAVENOUS | Status: AC
  Administered 2014-05-01: 600 mg via INTRAVENOUS
  Filled 2014-05-01: qty 50

## 2014-05-01 MED ORDER — CLINDAMYCIN PHOSPHATE 600 MG/50ML IV SOLN
600.0000 mg | Freq: Three times a day (TID) | INTRAVENOUS | Status: DC
  Administered 2014-05-02 – 2014-05-03 (×6): 600 mg via INTRAVENOUS
  Filled 2014-05-01 (×7): qty 50

## 2014-05-01 MED ORDER — PRAVASTATIN SODIUM 40 MG PO TABS
40.0000 mg | ORAL_TABLET | Freq: Every day | ORAL | Status: DC
  Administered 2014-05-01 – 2014-05-03 (×3): 40 mg via ORAL
  Filled 2014-05-01 (×4): qty 1

## 2014-05-01 MED ORDER — TIOTROPIUM BROMIDE MONOHYDRATE 18 MCG IN CAPS
18.0000 ug | ORAL_CAPSULE | Freq: Every day | RESPIRATORY_TRACT | Status: DC
  Administered 2014-05-02 – 2014-05-03 (×2): 18 ug via RESPIRATORY_TRACT
  Filled 2014-05-01: qty 5

## 2014-05-01 MED ORDER — ALBUTEROL SULFATE (2.5 MG/3ML) 0.083% IN NEBU
2.5000 mg | INHALATION_SOLUTION | Freq: Four times a day (QID) | RESPIRATORY_TRACT | Status: DC | PRN
  Administered 2014-05-02 – 2014-05-03 (×4): 2.5 mg via RESPIRATORY_TRACT
  Filled 2014-05-01 (×4): qty 3

## 2014-05-01 MED ORDER — HYDROMORPHONE HCL 1 MG/ML IJ SOLN
1.0000 mg | Freq: Once | INTRAMUSCULAR | Status: AC
  Administered 2014-05-01: 1 mg via INTRAVENOUS
  Filled 2014-05-01: qty 1

## 2014-05-01 NOTE — ED Notes (Signed)
Provided patient ice water from permission of provider.

## 2014-05-01 NOTE — ED Notes (Signed)
Denies trauma or injury to LLE.  States it has been getting worse over last several days, and LLE around ankle area is very painful but she can wiggle toes on L side, and has +1 pulse on LLE.

## 2014-05-01 NOTE — ED Notes (Signed)
Removed patient from bedpan. Pt tolerated it well. Patient urinated. Saved sample at bedside incase it is needed later.

## 2014-05-01 NOTE — ED Notes (Signed)
Pt in radiology. Attempted to get blood when starting an IV but unsuccessful at getting blood. Notified lab for blood drawl.

## 2014-05-01 NOTE — ED Provider Notes (Addendum)
CSN: [id number]     Arrival date & time 05/01/14  1904 History   First MD Initiated Contact with Patient 05/01/14 1922     Chief Complaint  Patient presents with  . Leg Swelling     (Consider location/radiation/quality/duration/timing/severity/associated sxs/prior Treatment) HPI  68 year old female presents with one week of left leg swelling. Swelling is most prominent in her left foot and ankle. Most tender site is in her right medial ankle. No known injuries. Patient states she has noticed redness on the top of her foot. She has swelling up to her calf as well. Her calf does not hurt. No fevers. She is chronically short of breath but is not worse currently. Currently rates the pain as a 10 out of 10.  Past Medical History  Diagnosis Date  . Asthma   . COPD (chronic obstructive pulmonary disease)   . Hypertension   . Diabetes mellitus 09/13/2010    glucophage  . Gout   . Oxygen dependent     pt uses O2   . Pericardial effusion 08/2011    small residual on echo  . Hyperlipidemia   . Type II or unspecified type diabetes mellitus with neurological manifestations, uncontrolled   . Personal history of noncompliance with medical treatment, presenting hazards to health   . Unspecified disease of pericardium   . Hemorrhage of rectum and anus   . Panic disorder without agoraphobia   . Depressive disorder, not elsewhere classified   . Edema   . Anxiety state, unspecified   . Unspecified hereditary and idiopathic peripheral neuropathy   . Type II or unspecified type diabetes mellitus without mention of complication, not stated as uncontrolled   . Other and unspecified hyperlipidemia   . Obesity, unspecified   . Unspecified tinnitus   . Unspecified essential hypertension   . Pneumonia, organism unspecified   . Chronic obstructive asthma, unspecified   . Acute and chronic respiratory failure   . Urinary frequency   . Urgency of urination   . Anemia due to chronic blood loss 01/04/2013    Past Surgical History  Procedure Laterality Date  . Breast biopsy      both breasts  . Total abdominal hysterectomy    . Esophagogastroduodenoscopy N/A 01/03/2013    Procedure: ESOPHAGOGASTRODUODENOSCOPY (EGD);  Surgeon: Inda Castle, MD;  Location: [facility];  Service: Endoscopy;  Laterality: N/A;  . Colonoscopy N/A 01/05/2013    Procedure: COLONOSCOPY;  Surgeon: Gatha Mayer, MD;  Location: [facility];  Service: Endoscopy;  Laterality: N/A;   Family History  Problem Relation Age of Onset  . COPD Mother   . COPD Sister   . Hypertension Sister   . Cancer Father   . Asthma Father   . Diabetes Other     3 maternal aunts  . Prostate cancer Paternal Uncle    History  Substance Use Topics  . Smoking status: Former Smoker -- 1.00 packs/day for 30 years    Types: Cigarettes    Quit date: 06/17/2004  . Smokeless tobacco: Never Used  . Alcohol Use: 1.0 oz/week    2 drink(s) per week     Comment: none iin a month   OB History    No data available     Review of Systems  Constitutional: Negative for fever.  Respiratory: Positive for shortness of breath (chronic, not worse ).   Cardiovascular: Positive for leg swelling. Negative for chest pain.  Gastrointestinal: Negative for vomiting.  Musculoskeletal: Positive for joint swelling.  Skin: Positive for color change. Negative for wound.  Neurological: Negative for weakness and numbness.  All other systems reviewed and are negative.     Allergies  Lisinopril; Advair diskus; Penicillins; and Levaquin  Home Medications   Prior to Admission medications   Medication Sig Start Date End Date Taking? Authorizing Provider  albuterol (PROAIR HFA) 108 (90 BASE) MCG/ACT inhaler Inhale 2 puffs into the lungs every 4 (four) hours as needed for shortness of breath. 04/27/14  Yes Tammy S Parrett, NP  albuterol (PROVENTIL) (2.5 MG/3ML) 0.083% nebulizer solution Take 2.5 mg by nebulization every 6 (six) hours as needed for  shortness of breath.    Yes Historical Provider, MD  budesonide-formoterol (SYMBICORT) 80-4.5 MCG/ACT inhaler Inhale 2 puffs into the lungs 2 (two) times daily. 04/05/14  Yes [name] L Reed, DO  hydrALAZINE (APRESOLINE) 25 MG tablet Take 1 tablet (25 mg total) by mouth every 8 (eight) hours. 01/21/14  Yes [name] L Reed, DO  lovastatin (MEVACOR) 40 MG tablet Take 1 tablet (40 mg total) by mouth at bedtime. For hyperlipedemia 10/27/13  Yes [name] L Reed, DO  Multiple Vitamins-Minerals (MULTIVITAMIN ADULTS 50+) TABS Take 1 tablet by mouth daily.   Yes Historical Provider, MD  pantoprazole (PROTONIX) 40 MG tablet Take 1 tablet (40 mg total) by mouth daily. 02/16/14  Yes Jerene Bears, MD  promethazine-codeine (PHENERGAN WITH CODEINE) 6.25-10 MG/5ML syrup Take 5 mLs by mouth every 6 (six) hours as needed for cough. 04/14/14  Yes Deneise Lever, MD  sertraline (ZOLOFT) 50 MG tablet Take 1 tablet (50 mg total) by mouth at bedtime. 01/21/14  Yes [name] L Reed, DO  tiotropium (SPIRIVA) 18 MCG inhalation capsule Place 1 capsule (18 mcg total) into inhaler and inhale daily. 10/22/13  Yes [name] L Reed, DO  guaiFENesin (MUCINEX) 600 MG 12 hr tablet Take 2 tablets (1,200 mg total) by mouth 2 (two) times daily. Patient not taking: Reported on 05/01/2014 04/21/14   Marijean Heath, NP  predniSONE (DELTASONE) 10 MG tablet Take 4 tabs PO daily x 2 days, then 3 tabs PO daily x 2 days, then 2 tabs PO daily x 2 days, then 1 tab PO daily x 2 days then, STOP 04/21/14   Marijean Heath, NP  rifaximin (XIFAXAN) 550 MG TABS tablet Take 1 tablet (550 mg total) by mouth 3 (three) times daily. 04/21/14   Marijean Heath, NP   BP 160/94 mmHg  Pulse 118  Temp(Src) 98.7 F (37.1 C) (Rectal)  Resp 24  Ht 5\' 2"  (1.575 m)  Wt 187 lb (84.823 kg)  BMI 34.19 kg/m2  SpO2 100% Physical Exam  Constitutional: She is oriented to person, place, and time. She appears well-developed and well-nourished. No distress.  HENT:   Head: Normocephalic and atraumatic.  Right Ear: External ear normal.  Left Ear: External ear normal.  Nose: Nose normal.  Eyes: Right eye exhibits no discharge. Left eye exhibits no discharge.  Cardiovascular: Regular rhythm and normal heart sounds.  Tachycardia present.   Pulses:      Dorsalis pedis pulses are 2+ on the right side, and 2+ on the left side.  Pulmonary/Chest: Effort normal and breath sounds normal. No respiratory distress. She has no wheezes.  Abdominal: Soft. She exhibits no distension. There is no tenderness.  Musculoskeletal:       Left ankle: She exhibits decreased range of motion and swelling. She exhibits normal pulse. Tenderness.       Left lower leg: She exhibits  swelling. She exhibits no tenderness.       Left foot: There is tenderness and swelling.  Left foot with diffuse swelling up to distal lower leg. Erythematous and warm. Pitting edema. Mild edema to calf without tenderness  Neurological: She is alert and oriented to person, place, and time.  Skin: Skin is warm and dry. She is not diaphoretic. There is erythema.  Nursing note and vitals reviewed.   ED Course  Procedures (including critical care time) Labs Review Labs Reviewed  CBC WITH DIFFERENTIAL - Abnormal; Notable for the following:    WBC 11.3 (*)    RBC 3.67 (*)    Hemoglobin 10.4 (*)    HCT 34.1 (*)    RDW 15.7 (*)    Neutrophils Relative % 79 (*)    Neutro Abs 8.9 (*)    All other components within normal limits  COMPREHENSIVE METABOLIC PANEL - Abnormal; Notable for the following:    CO2 34 (*)    Glucose, Bld 126 (*)    Albumin 3.3 (*)    Total Bilirubin <0.2 (*)    GFR calc non Af Amer 57 (*)    GFR calc Af Amer 66 (*)    All other components within normal limits  I-STAT CG4 LACTIC ACID, ED - Abnormal; Notable for the following:    Lactic Acid, Venous <0.30 (*)    All other components within normal limits  CBC    Imaging Review Dg Ankle Complete Left  05/01/2014   CLINICAL  DATA:  Denies trauma or injury to LLE. States pain and swelling has been getting worse over last several days, and LLE around ankle area is very painful but she can wiggle toes on L side.  EXAM: LEFT ANKLE COMPLETE - 3+ VIEW  COMPARISON:  Foot films same date.  FINDINGS: Diffuse soft tissue swelling about the medial and lateral malleolus. No acute fracture or dislocation. Normal talar dome. Base of fifth metatarsal not well visualized. Small calcaneal spur.  IMPRESSION: Soft tissue swelling, without acute osseous finding.   Electronically Signed   By: Abigail Miyamoto M.D.   On: 05/01/2014 20:43   Dg Foot Complete Left  05/01/2014   CLINICAL DATA:  Denies trauma or injury to LLE. 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 No acute fracture or dislocation. No periosteal reaction or callus deposition. Diffuse soft tissue swelling, most apparent about the dorsal midfoot and forefoot. No soft tissue gas or radiopaque foreign object. Small calcaneal spur.  IMPRESSION: Soft tissue swelling and degenerative change, without acute osseous abnormality.   Electronically Signed   By: Abigail Miyamoto M.D.   On: 05/01/2014 20:44     EKG Interpretation None      MDM   Final diagnoses:  Left leg swelling  Cellulitis of left foot    Patient with left foot cellulitis with continued tachycardia despite fluids and pain control. Is a diabetic, and would benefit from IV abx and admission. Dr. Alcario Drought consulted, will admit. There is no crepitus or area of obvious abscess. I have low suspicion for necrotizing fasciitis or other deep space infection.    Ephraim Hamburger, MD 05/01/14 [id number]  Ephraim Hamburger, MD 05/01/14 [phone]

## 2014-05-01 NOTE — H&P (Signed)
[facility] History and Physical  [name] PYK:[id number] DOB: [birth year] DOA: 05/01/2014  Referring physician: EDP PCP: Hollace Kinnier, DO   Chief Complaint: Leg swelling   HPI: [name] is a 68 y.o. female presents to ED with 1 week h/o leg swelling.  Worst in left foot and ankle.  Has redness on top of foot, swelling up to calf as well, no pain in calf, no fevers.  Chronically short of breath but states that this is not worse than baseline currently.  Persistent tachycardia into the 100s in the ED, hospitalist requested to admit.  Review of Systems: Systems reviewed.  As above, otherwise negative  Past Medical History  Diagnosis Date  . Asthma   . COPD (chronic obstructive pulmonary disease)   . Hypertension   . Diabetes mellitus 09/13/2010    glucophage  . Gout   . Oxygen dependent     pt uses O2   . Pericardial effusion 08/2011    small residual on echo  . Hyperlipidemia   . Type II or unspecified type diabetes mellitus with neurological manifestations, uncontrolled   . Personal history of noncompliance with medical treatment, presenting hazards to health   . Unspecified disease of pericardium   . Hemorrhage of rectum and anus   . Panic disorder without agoraphobia   . Depressive disorder, not elsewhere classified   . Edema   . Anxiety state, unspecified   . Unspecified hereditary and idiopathic peripheral neuropathy   . Type II or unspecified type diabetes mellitus without mention of complication, not stated as uncontrolled   . Other and unspecified hyperlipidemia   . Obesity, unspecified   . Unspecified tinnitus   . Unspecified essential hypertension   . Pneumonia, organism unspecified   . Chronic obstructive asthma, unspecified   . Acute and chronic respiratory failure   . Urinary frequency   . Urgency of urination   . Anemia due to chronic blood loss 01/04/2013   Past Surgical History  Procedure Laterality Date  . Breast biopsy       both breasts  . Total abdominal hysterectomy    . Esophagogastroduodenoscopy N/A 01/03/2013    Procedure: ESOPHAGOGASTRODUODENOSCOPY (EGD);  Surgeon: Inda Castle, MD;  Location: [facility];  Service: Endoscopy;  Laterality: N/A;  . Colonoscopy N/A 01/05/2013    Procedure: COLONOSCOPY;  Surgeon: Gatha Mayer, MD;  Location: [facility];  Service: Endoscopy;  Laterality: N/A;   Social History:  reports that she quit smoking about 9 years ago. Her smoking use included Cigarettes. She has a 30 pack-year smoking history. She has never used smokeless tobacco. She reports that she drinks about 1.0 oz of alcohol per week. She reports that she does not use illicit drugs.  Allergies  Allergen Reactions  . Lisinopril Swelling    Angioedema   . Advair Diskus [Fluticasone-Salmeterol] Other (See Comments)    Nose bleeds.   . Penicillins     hives  . Levaquin [Levofloxacin] Other (See Comments)    Family History  Problem Relation Age of Onset  . COPD Mother   . COPD Sister   . Hypertension Sister   . Cancer Father   . Asthma Father   . Diabetes Other     3 maternal aunts  . Prostate cancer Paternal Uncle      Prior to Admission medications   Medication Sig Start Date End Date Taking? Authorizing Provider  albuterol (PROAIR HFA) 108 (90 BASE) MCG/ACT inhaler Inhale 2 puffs into the  lungs every 4 (four) hours as needed for shortness of breath. 04/27/14  Yes Tammy S Parrett, NP  albuterol (PROVENTIL) (2.5 MG/3ML) 0.083% nebulizer solution Take 2.5 mg by nebulization every 6 (six) hours as needed for shortness of breath.    Yes Historical Provider, MD  budesonide-formoterol (SYMBICORT) 80-4.5 MCG/ACT inhaler Inhale 2 puffs into the lungs 2 (two) times daily. 04/05/14  Yes [name] L Reed, DO  hydrALAZINE (APRESOLINE) 25 MG tablet Take 1 tablet (25 mg total) by mouth every 8 (eight) hours. 01/21/14  Yes [name] L Reed, DO  lovastatin (MEVACOR) 40 MG tablet Take 1 tablet (40 mg total) by  mouth at bedtime. For hyperlipedemia 10/27/13  Yes [name] L Reed, DO  Multiple Vitamins-Minerals (MULTIVITAMIN ADULTS 50+) TABS Take 1 tablet by mouth daily.   Yes Historical Provider, MD  pantoprazole (PROTONIX) 40 MG tablet Take 1 tablet (40 mg total) by mouth daily. 02/16/14  Yes Jerene Bears, MD  promethazine-codeine (PHENERGAN WITH CODEINE) 6.25-10 MG/5ML syrup Take 5 mLs by mouth every 6 (six) hours as needed for cough. 04/14/14  Yes Deneise Lever, MD  sertraline (ZOLOFT) 50 MG tablet Take 1 tablet (50 mg total) by mouth at bedtime. 01/21/14  Yes [name] L Reed, DO  tiotropium (SPIRIVA) 18 MCG inhalation capsule Place 1 capsule (18 mcg total) into inhaler and inhale daily. 10/22/13  Yes Gayland Curry, DO   Physical Exam: Filed Vitals:   05/01/14 2144  BP: 157/84  Pulse: 61  Temp: 98.6 F (37 C)  Resp: 22    BP 157/84 mmHg  Pulse 61  Temp(Src) 98.6 F (37 C) (Oral)  Resp 22  Ht 5\' 2"  (1.575 m)  Wt 84.823 kg (187 lb)  BMI 34.19 kg/m2  SpO2 96%  General Appearance:    Alert, oriented, no distress, appears stated age  Head:    Normocephalic, atraumatic  Eyes:    PERRL, EOMI, sclera non-icteric        Nose:   Nares without drainage or epistaxis. Mucosa, turbinates normal  Throat:   Moist mucous membranes. Oropharynx without erythema or exudate.  Neck:   Supple. No carotid bruits.  No thyromegaly.  No lymphadenopathy.   Back:     No CVA tenderness, no spinal tenderness  Lungs:     Clear to auscultation bilaterally, without wheezes, rhonchi or rales  Chest wall:    No tenderness to palpitation  Heart:    Regular rate and rhythm without murmurs, gallops, rubs  Abdomen:     Soft, non-tender, nondistended, normal bowel sounds, no organomegaly  Genitalia:    deferred  Rectal:    deferred  Extremities:   No clubbing, cyanosis or edema.  Pulses:   2+ and symmetric all extremities  Skin:   Skin color, texture, turgor normal, no rashes or lesions  Lymph nodes:   Cervical,  supraclavicular, and axillary nodes normal  Neurologic:   CNII-XII intact. Normal strength, sensation and reflexes      throughout    Labs on Admission:  Basic Metabolic Panel:  Recent Labs Lab 05/01/14 2051  NA 140  K 3.8  CL 96  CO2 34*  GLUCOSE 126*  BUN 19  CREATININE 1.00  CALCIUM 9.0   Liver Function Tests:  Recent Labs Lab 05/01/14 2051  AST 16  ALT 17  ALKPHOS 70  BILITOT <0.2*  PROT 7.0  ALBUMIN 3.3*   No results for input(s): LIPASE, AMYLASE in the last 168 hours. No results for input(s): AMMONIA in  the last 168 hours. CBC:  Recent Labs Lab 05/01/14 2051  WBC 11.3*  NEUTROABS 8.9*  HGB 10.4*  HCT 34.1*  MCV 92.9  PLT 250   Cardiac Enzymes: No results for input(s): CKTOTAL, CKMB, CKMBINDEX, TROPONINI in the last 168 hours.  BNP (last 3 results)  Recent Labs  04/18/14 2005  PROBNP 554.9*   CBG: No results for input(s): GLUCAP in the last 168 hours.  Radiological Exams on Admission: Dg Ankle Complete Left  05/01/2014   CLINICAL DATA:  Denies trauma or injury to LLE. States pain and swelling has been getting worse over last several days, and LLE around ankle area is very painful but she can wiggle toes on L side.  EXAM: LEFT ANKLE COMPLETE - 3+ VIEW  COMPARISON:  Foot films same date.  FINDINGS: Diffuse soft tissue swelling about the medial and lateral malleolus. No acute fracture or dislocation. Normal talar dome. Base of fifth metatarsal not well visualized. Small calcaneal spur.  IMPRESSION: Soft tissue swelling, without acute osseous finding.   Electronically Signed   By: Abigail Miyamoto M.D.   On: 05/01/2014 20:43   Dg Foot Complete Left  05/01/2014   CLINICAL DATA:  Denies trauma or injury to LLE. States pain and swelling has been getting worse over last several days, and LLE around ankle area is very painful but she can wiggle toes on L side  EXAM: LEFT FOOT - COMPLETE 3+ VIEW  COMPARISON:  Ankle films, dictated separately  FINDINGS: First  metatarsal phalangeal joint osteoarthritis is mild. No acute fracture or dislocation. No periosteal reaction or callus deposition. Diffuse soft tissue swelling, most apparent about the dorsal midfoot and forefoot. No soft tissue gas or radiopaque foreign object. Small calcaneal spur.  IMPRESSION: Soft tissue swelling and degenerative change, without acute osseous abnormality.   Electronically Signed   By: Abigail Miyamoto M.D.   On: 05/01/2014 20:44    EKG: Independently reviewed.  Assessment/Plan Principal Problem:   Cellulitis of left foot Active Problems:   COPD, frequent exacerbations   Chronic respiratory failure with hypoxia   Tachycardia   1. Cellulitis of L foot - will admit due to persistent tachycardia 1. Tele monitor 2. Clindamycin IV 3. Repeat CBC in AM to monitor leukocytosis of 11.3k 4. Gentle hydration with 75cc/hr NS 2. COPD - chronic, very severe, but baseline at this point patient states.    Code Status: Full Code  Family Communication: No family in room Disposition Plan: admit to obs   Time spent: 50 min  GARDNER, JARED M. [facility] Pager [phone]  If 7AM-7PM, please contact the day team taking care of the patient [email or web address] Password [email or web address] 05/01/2014, 10:22 PM

## 2014-05-01 NOTE — ED Notes (Signed)
Bed: WA07 Expected date:  Expected time:  Means of arrival:  Comments: Leg edema

## 2014-05-01 NOTE — ED Notes (Signed)
COPD hx and constantly SOB, denies CP

## 2014-05-02 DIAGNOSIS — R579 Shock, unspecified: Secondary | ICD-10-CM | POA: Diagnosis present

## 2014-05-02 DIAGNOSIS — J45909 Unspecified asthma, uncomplicated: Secondary | ICD-10-CM | POA: Diagnosis present

## 2014-05-02 DIAGNOSIS — N179 Acute kidney failure, unspecified: Secondary | ICD-10-CM | POA: Diagnosis present

## 2014-05-02 DIAGNOSIS — I5030 Unspecified diastolic (congestive) heart failure: Secondary | ICD-10-CM | POA: Diagnosis present

## 2014-05-02 DIAGNOSIS — R3915 Urgency of urination: Secondary | ICD-10-CM | POA: Diagnosis present

## 2014-05-02 DIAGNOSIS — K219 Gastro-esophageal reflux disease without esophagitis: Secondary | ICD-10-CM | POA: Diagnosis present

## 2014-05-02 DIAGNOSIS — G9341 Metabolic encephalopathy: Secondary | ICD-10-CM | POA: Diagnosis present

## 2014-05-02 DIAGNOSIS — J9621 Acute and chronic respiratory failure with hypoxia: Secondary | ICD-10-CM | POA: Diagnosis present

## 2014-05-02 DIAGNOSIS — E1165 Type 2 diabetes mellitus with hyperglycemia: Secondary | ICD-10-CM | POA: Diagnosis present

## 2014-05-02 DIAGNOSIS — D638 Anemia in other chronic diseases classified elsewhere: Secondary | ICD-10-CM | POA: Diagnosis present

## 2014-05-02 DIAGNOSIS — Z6834 Body mass index (BMI) 34.0-34.9, adult: Secondary | ICD-10-CM | POA: Diagnosis not present

## 2014-05-02 DIAGNOSIS — I272 Other secondary pulmonary hypertension: Secondary | ICD-10-CM | POA: Diagnosis present

## 2014-05-02 DIAGNOSIS — R000 Tachycardia, unspecified: Secondary | ICD-10-CM | POA: Diagnosis present

## 2014-05-02 DIAGNOSIS — Y848 Other medical procedures as the cause of abnormal reaction of the patient, or of later complication, without mention of misadventure at the time of the procedure: Secondary | ICD-10-CM | POA: Diagnosis present

## 2014-05-02 DIAGNOSIS — J441 Chronic obstructive pulmonary disease with (acute) exacerbation: Secondary | ICD-10-CM | POA: Diagnosis present

## 2014-05-02 DIAGNOSIS — E785 Hyperlipidemia, unspecified: Secondary | ICD-10-CM | POA: Diagnosis present

## 2014-05-02 DIAGNOSIS — I313 Pericardial effusion (noninflammatory): Secondary | ICD-10-CM | POA: Diagnosis not present

## 2014-05-02 DIAGNOSIS — E119 Type 2 diabetes mellitus without complications: Secondary | ICD-10-CM | POA: Diagnosis present

## 2014-05-02 DIAGNOSIS — L03116 Cellulitis of left lower limb: Secondary | ICD-10-CM | POA: Diagnosis present

## 2014-05-02 DIAGNOSIS — E1149 Type 2 diabetes mellitus with other diabetic neurological complication: Secondary | ICD-10-CM | POA: Diagnosis present

## 2014-05-02 DIAGNOSIS — F419 Anxiety disorder, unspecified: Secondary | ICD-10-CM | POA: Diagnosis present

## 2014-05-02 DIAGNOSIS — E669 Obesity, unspecified: Secondary | ICD-10-CM | POA: Diagnosis present

## 2014-05-02 DIAGNOSIS — F329 Major depressive disorder, single episode, unspecified: Secondary | ICD-10-CM | POA: Diagnosis present

## 2014-05-02 DIAGNOSIS — F410 Panic disorder [episodic paroxysmal anxiety] without agoraphobia: Secondary | ICD-10-CM | POA: Diagnosis present

## 2014-05-02 DIAGNOSIS — M109 Gout, unspecified: Secondary | ICD-10-CM | POA: Diagnosis present

## 2014-05-02 DIAGNOSIS — Z880 Allergy status to penicillin: Secondary | ICD-10-CM | POA: Diagnosis not present

## 2014-05-02 DIAGNOSIS — E876 Hypokalemia: Secondary | ICD-10-CM | POA: Diagnosis present

## 2014-05-02 DIAGNOSIS — T380X5A Adverse effect of glucocorticoids and synthetic analogues, initial encounter: Secondary | ICD-10-CM | POA: Diagnosis present

## 2014-05-02 DIAGNOSIS — I10 Essential (primary) hypertension: Secondary | ICD-10-CM | POA: Diagnosis present

## 2014-05-02 DIAGNOSIS — Z9981 Dependence on supplemental oxygen: Secondary | ICD-10-CM | POA: Diagnosis not present

## 2014-05-02 DIAGNOSIS — Z87891 Personal history of nicotine dependence: Secondary | ICD-10-CM | POA: Diagnosis not present

## 2014-05-02 LAB — CBC
HCT: 32.7 % — ABNORMAL LOW (ref 36.0–46.0)
HEMOGLOBIN: 10.1 g/dL — AB (ref 12.0–15.0)
MCH: 28.9 pg (ref 26.0–34.0)
MCHC: 30.9 g/dL (ref 30.0–36.0)
MCV: 93.4 fL (ref 78.0–100.0)
Platelets: 262 10*3/uL (ref 150–400)
RBC: 3.5 MIL/uL — AB (ref 3.87–5.11)
RDW: 16.1 % — ABNORMAL HIGH (ref 11.5–15.5)
WBC: 10.5 10*3/uL (ref 4.0–10.5)

## 2014-05-02 MED ORDER — BENZONATATE 100 MG PO CAPS
100.0000 mg | ORAL_CAPSULE | Freq: Two times a day (BID) | ORAL | Status: DC | PRN
  Administered 2014-05-03: 100 mg via ORAL
  Filled 2014-05-02: qty 1

## 2014-05-02 NOTE — Plan of Care (Signed)
Problem: Phase I Progression Outcomes Goal: Voiding-avoid urinary catheter unless indicated Outcome: Completed/Met Date Met:  05/02/14 Goal: Temperature < 102 Outcome: Completed/Met Date Met:  05/02/14

## 2014-05-02 NOTE — Progress Notes (Signed)
UR completed 

## 2014-05-02 NOTE — Plan of Care (Signed)
Problem: Phase I Progression Outcomes Goal: Pain controlled with appropriate interventions Outcome: Completed/Met Date Met:  05/02/14     

## 2014-05-02 NOTE — Progress Notes (Signed)
Nutrition Brief Note  Patient identified on the Malnutrition Screening Tool (MST) Report  Pt reports UBW of 200 lb when she moved to [place] 4 years ago. Pt's weight has been stable within past year. Pt states her appetite is fair to good. Pt with PMHx of DM, previously saw RD in pulmonary rehab.  Wt Readings from Last 15 Encounters:  05/01/14 187 lb (84.823 kg)  04/27/14 187 lb 3.2 oz (84.913 kg)  04/21/14 180 lb 8.9 oz (81.9 kg)  03/30/14 183 lb (83.008 kg)  03/15/14 184 lb (83.462 kg)  02/16/14 191 lb (86.637 kg)  02/03/14 191 lb 9.6 oz (86.909 kg)  01/21/14 189 lb 3.2 oz (85.821 kg)  12/06/13 192 lb 12.8 oz (87.454 kg)  10/22/13 194 lb (87.998 kg)  08/16/13 189 lb 3.2 oz (85.821 kg)  07/15/13 187 lb 6.4 oz (85.004 kg)  04/15/13 190 lb 12.8 oz (86.546 kg)  01/28/13 190 lb 9.6 oz (86.456 kg)  01/01/13 193 lb 14.4 oz (87.952 kg)    Body mass index is 34.19 kg/(m^2). Patient meets criteria for obesity based on current BMI.   Current diet order is heart healthy, patient is consuming approximately 100% of meals at this time. Labs and medications reviewed.   No nutrition interventions warranted at this time. If nutrition issues arise, please consult RD.   Clayton Bibles, MS, RD, LDN Pager: [phone] After Hours Pager: [phone]

## 2014-05-02 NOTE — Progress Notes (Signed)
[facility] PROGRESS NOTE  [name] TIR:[id number] DOB: Nov 13, 1945 DOA: 2014/05/09 PCP: Hollace Kinnier, DO  Assessment/Plan: Principal Problem:   Cellulitis of left foot - patient is currently on clindamycin and reporting improvement in current condition. - will obtain left lower extremity duplex to expand differential and rule out left lower extremity DVT  Active Problems:   COPD, frequent exacerbations - stable continue current medical management    Tachycardia - Patient has been having tachycardia on and off. Most likely due to active infection as well as side effect of albuterol.  Code Status: full Family Communication: no family at bedside  Disposition Plan: pending continued improvement in condition   Consultants:  none  Procedures:  none  Antibiotics:  clindamycin  HPI/Subjective: The patient feels better today  Objective: Filed Vitals:   05/02/14 1423  BP: 133/66  Pulse: 106  Temp: 99.3 F (37.4 C)  Resp: 20    Intake/Output Summary (Last 24 hours) at 05/02/14 1801 Last data filed at 05/02/14 1405  Gross per 24 hour  Intake    540 ml  Output    500 ml  Net     40 ml   Filed Weights   05-09-14 1911 2014/05/09 2334  Weight: 84.823 kg (187 lb) 84.823 kg (187 lb)    Exam:   General:  Patient in no acute distress, alert and awake  Cardiovascular: regular rate and rhythm, no murmurs or rubs  Respiratory: prolonged expiratory phase, no wheezes  Abdomen: soft, nondistended  Musculoskeletal: no cyanosis or clubbing on limited exam  Skin: Left leg more swollen than right , painful on palpation  Data Reviewed: Basic Metabolic Panel:  Recent Labs Lab 2014-05-09 2051  NA 140  K 3.8  CL 96  CO2 34*  GLUCOSE 126*  BUN 19  CREATININE 1.00  CALCIUM 9.0   Liver Function Tests:  Recent Labs Lab 2014/05/09 2051  AST 16  ALT 17  ALKPHOS 70  BILITOT <0.2*  PROT 7.0  ALBUMIN 3.3*   No results for input(s): LIPASE,  AMYLASE in the last 168 hours. No results for input(s): AMMONIA in the last 168 hours. CBC:  Recent Labs Lab 05-09-14 2051 05/02/14 0454  WBC 11.3* 10.5  NEUTROABS 8.9*  --   HGB 10.4* 10.1*  HCT 34.1* 32.7*  MCV 92.9 93.4  PLT 250 262   Cardiac Enzymes: No results for input(s): CKTOTAL, CKMB, CKMBINDEX, TROPONINI in the last 168 hours. BNP (last 3 results)  Recent Labs  04/18/14 2005  PROBNP 554.9*   CBG: No results for input(s): GLUCAP in the last 168 hours.  No results found for this or any previous visit (from the past 240 hour(s)).   Studies: Dg Ankle Complete Left  05-09-2014   CLINICAL DATA:  Denies trauma or injury to LLE. States pain and swelling has been getting worse over last several days, and LLE around ankle area is very painful but she can wiggle toes on L side.  EXAM: LEFT ANKLE COMPLETE - 3+ VIEW  COMPARISON:  Foot films same date.  FINDINGS: Diffuse soft tissue swelling about the medial and lateral malleolus. No acute fracture or dislocation. Normal talar dome. Base of fifth metatarsal not well visualized. Small calcaneal spur.  IMPRESSION: Soft tissue swelling, without acute osseous finding.   Electronically Signed   By: Abigail Miyamoto M.D.   On: May 09, 2014 20:43   Dg Foot Complete Left  2014/05/09   CLINICAL DATA:  Denies trauma or injury to LLE. 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 No acute fracture or dislocation. No periosteal reaction or callus deposition. Diffuse soft tissue swelling, most apparent about the dorsal midfoot and forefoot. No soft tissue gas or radiopaque foreign object. Small calcaneal spur.  IMPRESSION: Soft tissue swelling and degenerative change, without acute osseous abnormality.    Electronically Signed   By: Abigail Miyamoto M.D.   On: 05/01/2014 20:44    Scheduled Meds: . budesonide-formoterol  2 puff Inhalation BID  . clindamycin (CLEOCIN) IV  600 mg Intravenous 3 times per day  . heparin  5,000 Units Subcutaneous 3 times per day  . hydrALAZINE  25 mg Oral 3 times per day  . multivitamin with minerals  1 tablet Oral Daily  . pantoprazole  40 mg Oral Daily  . pravastatin  40 mg Oral q1800  . sertraline  50 mg Oral QHS  . sodium chloride  3 mL Intravenous Q12H  . tiotropium  18 mcg Inhalation Daily   Continuous Infusions: . sodium chloride 75 mL/hr at 05/02/14 1453     Time spent: > 35 minutes    Velvet Bathe  [facility] Pager [phone]. If 7PM-7AM, please contact night-coverage at [email or web address], password [facility] 05/02/2014, 6:01 PM  LOS: 1 day

## 2014-05-03 DIAGNOSIS — M79609 Pain in unspecified limb: Secondary | ICD-10-CM

## 2014-05-03 DIAGNOSIS — R609 Edema, unspecified: Secondary | ICD-10-CM

## 2014-05-03 MED ORDER — HYDROXYZINE HCL 25 MG PO TABS
25.0000 mg | ORAL_TABLET | Freq: Three times a day (TID) | ORAL | Status: DC | PRN
  Administered 2014-05-03 (×2): 25 mg via ORAL
  Filled 2014-05-03 (×4): qty 1

## 2014-05-03 MED ORDER — FUROSEMIDE 10 MG/ML IJ SOLN
40.0000 mg | Freq: Once | INTRAMUSCULAR | Status: AC
  Administered 2014-05-03: 40 mg via INTRAVENOUS
  Filled 2014-05-03: qty 4

## 2014-05-03 NOTE — Progress Notes (Signed)
[facility] PROGRESS NOTE  [name] DJS:[id number] DOB: 01-09-1946 DOA: 05-09-14 PCP: Hollace Kinnier, DO  Assessment/Plan: Principal Problem:   Cellulitis of left foot - Will continue another day of clindamycin IV as patient still having discomfort - will obtain left lower extremity duplex : preliminary result negative  Active Problems:   COPD, frequent exacerbations - stable continue current medical management    Tachycardia - Patient has been having tachycardia on and off. Most likely due to active infection as well as side effect of albuterol.  Code Status: full Family Communication: no family at bedside  Disposition Plan: Most likely dc within the next 1-2 days with continued improvement.   Consultants:  none  Procedures:  none  Antibiotics:  clindamycin  HPI/Subjective: The patient reports feeling anxious today. Minimal improvement in discomfort reported.  Objective: Filed Vitals:   05/03/14 1459  BP: 149/71  Pulse: 91  Temp: 97.9 F (36.6 C)  Resp: 22    Intake/Output Summary (Last 24 hours) at 05/03/14 1623 Last data filed at 05/03/14 1500  Gross per 24 hour  Intake   2745 ml  Output    800 ml  Net   1945 ml   Filed Weights   05-09-2014 1911 05/09/2014 2334  Weight: 84.823 kg (187 lb) 84.823 kg (187 lb)    Exam:   General:  Patient in no acute distress, alert and awake  Cardiovascular: regular rate and rhythm, no murmurs or rubs  Respiratory: prolonged expiratory phase, no wheezes  Abdomen: soft, nondistended  Musculoskeletal: no cyanosis or clubbing on limited exam  Skin: Left leg more swollen than right , painful on palpation  Data Reviewed: Basic Metabolic Panel:  Recent Labs Lab 05/09/2014 2051  NA 140  K 3.8  CL 96  CO2 34*  GLUCOSE 126*  BUN 19  CREATININE 1.00  CALCIUM 9.0   Liver Function Tests:  Recent Labs Lab 2014/05/09 2051  AST 16  ALT 17  ALKPHOS 70  BILITOT <0.2*  PROT 7.0  ALBUMIN  3.3*   No results for input(s): LIPASE, AMYLASE in the last 168 hours. No results for input(s): AMMONIA in the last 168 hours. CBC:  Recent Labs Lab 05/09/14 2051 05/02/14 0454  WBC 11.3* 10.5  NEUTROABS 8.9*  --   HGB 10.4* 10.1*  HCT 34.1* 32.7*  MCV 92.9 93.4  PLT 250 262   Cardiac Enzymes: No results for input(s): CKTOTAL, CKMB, CKMBINDEX, TROPONINI in the last 168 hours. BNP (last 3 results)  Recent Labs  04/18/14 2005  PROBNP 554.9*   CBG: No results for input(s): GLUCAP in the last 168 hours.  No results found for this or any previous visit (from the past 240 hour(s)).   Studies: Dg Ankle Complete Left  05/09/2014   CLINICAL DATA:  Denies trauma or injury to LLE. States pain and swelling has been getting worse over last several days, and LLE around ankle area is very painful but she can wiggle toes on L side.  EXAM: LEFT ANKLE COMPLETE - 3+ VIEW  COMPARISON:  Foot films same date.  FINDINGS: Diffuse soft tissue swelling about the medial and lateral malleolus. No acute fracture or dislocation. Normal talar dome. Base of fifth metatarsal not well visualized. Small calcaneal spur.  IMPRESSION: Soft tissue swelling, without acute osseous finding.   Electronically Signed   By: Abigail Miyamoto M.D.   On: 2014/05/09 20:43   Dg Foot Complete Left  05/09/14   CLINICAL DATA:  Denies trauma or injury  to LLE. 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 No acute fracture or dislocation. No periosteal reaction or callus deposition. Diffuse soft tissue swelling, most apparent about the dorsal midfoot and forefoot. No soft tissue gas or radiopaque foreign object. Small calcaneal spur.  IMPRESSION: Soft tissue swelling and degenerative  change, without acute osseous abnormality.   Electronically Signed   By: Abigail Miyamoto M.D.   On: 05/01/2014 20:44    Scheduled Meds: . budesonide-formoterol  2 puff Inhalation BID  . clindamycin (CLEOCIN) IV  600 mg Intravenous 3 times per day  . heparin  5,000 Units Subcutaneous 3 times per day  . hydrALAZINE  25 mg Oral 3 times per day  . multivitamin with minerals  1 tablet Oral Daily  . pantoprazole  40 mg Oral Daily  . pravastatin  40 mg Oral q1800  . sertraline  50 mg Oral QHS  . sodium chloride  3 mL Intravenous Q12H  . tiotropium  18 mcg Inhalation Daily   Continuous Infusions: . sodium chloride 75 mL/hr at 05/03/14 0237     Time spent: > 35 minutes    Velvet Bathe  [facility] Pager [phone]. If 7PM-7AM, please contact night-coverage at [email or web address], password [facility] 05/03/2014, 4:23 PM  LOS: 2 days

## 2014-05-03 NOTE — Progress Notes (Signed)
Patient cont to c/o of SOB, "I cant breath" per patient. Increased O2 to 3 L, O2 sat 94% at 3L/min. Patient in high fowlers position, RR 28 deep, labored. Lung sound diminished all lobes. LLE 3+ edema,RLE trace edema noted. On call NP paged with new order received:lasix 40 mg Iv x1.Order carried out. Will monitor patient closely.

## 2014-05-03 NOTE — Progress Notes (Signed)
VASCULAR LAB PRELIMINARY  PRELIMINARY  PRELIMINARY  PRELIMINARY  Left lower extremity venous duplex completed.    Preliminary report:  Left:  No evidence of DVT, superficial thrombosis, or Baker's cyst.  [name], RVS 05/03/2014, 10:40 AM

## 2014-05-04 ENCOUNTER — Other Ambulatory Visit: Payer: Self-pay

## 2014-05-04 ENCOUNTER — Inpatient Hospital Stay (HOSPITAL_COMMUNITY): Payer: Medicare Other

## 2014-05-04 ENCOUNTER — Inpatient Hospital Stay (HOSPITAL_COMMUNITY): Payer: Medicare Other | Admitting: Registered Nurse

## 2014-05-04 DIAGNOSIS — M7989 Other specified soft tissue disorders: Secondary | ICD-10-CM | POA: Diagnosis present

## 2014-05-04 DIAGNOSIS — I319 Disease of pericardium, unspecified: Secondary | ICD-10-CM

## 2014-05-04 DIAGNOSIS — J9600 Acute respiratory failure, unspecified whether with hypoxia or hypercapnia: Secondary | ICD-10-CM | POA: Diagnosis not present

## 2014-05-04 DIAGNOSIS — J9602 Acute respiratory failure with hypercapnia: Secondary | ICD-10-CM

## 2014-05-04 LAB — BLOOD GAS, ARTERIAL
ACID-BASE EXCESS: 3.5 mmol/L — AB (ref 0.0–2.0)
Bicarbonate: 33 mEq/L — ABNORMAL HIGH (ref 20.0–24.0)
Drawn by: 235321
Drawn by: 31814
FIO2: 1 %
FIO2: 0.6 %
LHR: 16 {breaths}/min
O2 SAT: 96 %
O2 Saturation: 95.2 %
PEEP: 5 cmH2O
Patient temperature: 98
Patient temperature: 34.5
TCO2: 32.4 mmol/L (ref 0–100)
VT: 470 mL
pCO2 arterial: 79.4 mmHg (ref 35.0–45.0)
pH, Arterial: 7.149 — CL (ref 7.350–7.450)
pH, Arterial: 7.224 — ABNORMAL LOW (ref 7.350–7.450)
pO2, Arterial: 92.8 mmHg (ref 80.0–100.0)
pO2, Arterial: 79.4 mmHg — ABNORMAL LOW (ref 80.0–100.0)

## 2014-05-04 LAB — LACTIC ACID, PLASMA: Lactic Acid, Venous: 0.9 mmol/L (ref 0.5–2.2)

## 2014-05-04 LAB — CBC
HEMATOCRIT: 29.5 % — AB (ref 36.0–46.0)
Hemoglobin: 8.9 g/dL — ABNORMAL LOW (ref 12.0–15.0)
MCH: 28.2 pg (ref 26.0–34.0)
MCHC: 30.2 g/dL (ref 30.0–36.0)
MCV: 93.4 fL (ref 78.0–100.0)
PLATELETS: 227 10*3/uL (ref 150–400)
RBC: 3.16 MIL/uL — ABNORMAL LOW (ref 3.87–5.11)
RDW: 15.9 % — AB (ref 11.5–15.5)
WBC: 9.1 10*3/uL (ref 4.0–10.5)

## 2014-05-04 LAB — GLUCOSE, CAPILLARY
GLUCOSE-CAPILLARY: 168 mg/dL — AB (ref 70–99)
GLUCOSE-CAPILLARY: 136 mg/dL — AB (ref 70–99)
Glucose-Capillary: 158 mg/dL — ABNORMAL HIGH (ref 70–99)
Glucose-Capillary: 141 mg/dL — ABNORMAL HIGH (ref 70–99)
Glucose-Capillary: 78 mg/dL (ref 70–99)
Glucose-Capillary: 98 mg/dL (ref 70–99)

## 2014-05-04 LAB — COMPREHENSIVE METABOLIC PANEL
ALBUMIN: 2.9 g/dL — AB (ref 3.5–5.2)
ALT: 29 U/L (ref 0–35)
AST: 21 U/L (ref 0–37)
Alkaline Phosphatase: 64 U/L (ref 39–117)
Anion gap: 10 (ref 5–15)
BUN: 19 mg/dL (ref 6–23)
CALCIUM: 8.5 mg/dL (ref 8.4–10.5)
CO2: 34 mEq/L — ABNORMAL HIGH (ref 19–32)
CREATININE: 1.19 mg/dL — AB (ref 0.50–1.10)
Chloride: 98 mEq/L (ref 96–112)
GFR calc Af Amer: 53 mL/min — ABNORMAL LOW (ref >90)
GFR calc non Af Amer: 46 mL/min — ABNORMAL LOW (ref >90)
Glucose, Bld: 161 mg/dL — ABNORMAL HIGH (ref 70–99)
Potassium: 3.7 mEq/L (ref 3.7–5.3)
Sodium: 142 mEq/L (ref 137–147)
Total Bilirubin: <0.2 mg/dL — ABNORMAL LOW (ref 0.3–1.2)
Total Protein: 6.5 g/dL (ref 6.0–8.3)

## 2014-05-04 LAB — TROPONIN I: Troponin I: <0.3 ng/mL (ref <0.30)

## 2014-05-04 LAB — MRSA PCR SCREENING: MRSA by PCR: NEGATIVE

## 2014-05-04 MED ORDER — MIDAZOLAM HCL 2 MG/2ML IJ SOLN
2.0000 mg | INTRAMUSCULAR | Status: DC | PRN
  Administered 2014-05-05 – 2014-05-06 (×8): 2 mg via INTRAVENOUS
  Filled 2014-05-04 (×8): qty 2

## 2014-05-04 MED ORDER — BUDESONIDE 0.25 MG/2ML IN SUSP
0.2500 mg | Freq: Four times a day (QID) | RESPIRATORY_TRACT | Status: DC
  Administered 2014-05-04: 0.25 mg via RESPIRATORY_TRACT
  Filled 2014-05-04: qty 2

## 2014-05-04 MED ORDER — BUDESONIDE 0.25 MG/2ML IN SUSP
0.5000 mg | Freq: Four times a day (QID) | RESPIRATORY_TRACT | Status: DC
  Filled 2014-05-04: qty 4

## 2014-05-04 MED ORDER — FENTANYL CITRATE 0.05 MG/ML IJ SOLN: 50.0000 ug | INTRAMUSCULAR | Status: DC | PRN

## 2014-05-04 MED ORDER — LEVOFLOXACIN IN D5W 750 MG/150ML IV SOLN
750.0000 mg | INTRAVENOUS | Status: DC
  Administered 2014-05-04: 750 mg via INTRAVENOUS
  Filled 2014-05-04: qty 150

## 2014-05-04 MED ORDER — CETYLPYRIDINIUM CHLORIDE 0.05 % MT LIQD
7.0000 mL | Freq: Four times a day (QID) | OROMUCOSAL | Status: DC
  Administered 2014-05-04: 7 mL via OROMUCOSAL

## 2014-05-04 MED ORDER — CHLORHEXIDINE GLUCONATE 0.12 % MT SOLN: 15.0000 mL | Freq: Two times a day (BID) | OROMUCOSAL | Status: DC

## 2014-05-04 MED ORDER — HYDROCORTISONE NA SUCCINATE PF 100 MG IJ SOLR: 50.0000 mg | Freq: Four times a day (QID) | INTRAMUSCULAR | Status: DC

## 2014-05-04 MED ORDER — DOCUSATE SODIUM 50 MG/5ML PO LIQD
100.0000 mg | Freq: Two times a day (BID) | ORAL | Status: DC | PRN
  Filled 2014-05-04: qty 10

## 2014-05-04 MED ORDER — FENTANYL CITRATE 0.05 MG/ML IJ SOLN
50.0000 ug | INTRAMUSCULAR | Status: DC | PRN
  Administered 2014-05-04 – 2014-05-06 (×8): 50 ug via INTRAVENOUS
  Filled 2014-05-04 (×10): qty 2

## 2014-05-04 MED ORDER — IPRATROPIUM-ALBUTEROL 0.5-2.5 (3) MG/3ML IN SOLN
3.0000 mL | Freq: Four times a day (QID) | RESPIRATORY_TRACT | Status: DC
  Administered 2014-05-04: 3 mL via RESPIRATORY_TRACT
  Filled 2014-05-04: qty 3

## 2014-05-04 MED ORDER — INSULIN ASPART 100 UNIT/ML ~~LOC~~ SOLN
0.0000 [IU] | SUBCUTANEOUS | Status: DC
  Administered 2014-05-04: 2 [IU] via SUBCUTANEOUS
  Administered 2014-05-04: 3 [IU] via SUBCUTANEOUS
  Administered 2014-05-04: 2 [IU] via SUBCUTANEOUS
  Administered 2014-05-05 (×2): 3 [IU] via SUBCUTANEOUS
  Administered 2014-05-05 – 2014-05-06 (×5): 2 [IU] via SUBCUTANEOUS
  Administered 2014-05-06: 3 [IU] via SUBCUTANEOUS
  Administered 2014-05-06: 2 [IU] via SUBCUTANEOUS

## 2014-05-04 MED ORDER — RANITIDINE HCL 150 MG/10ML PO SYRP
150.0000 mg | ORAL_SOLUTION | Freq: Two times a day (BID) | ORAL | Status: DC
Start: 2014-05-04 — End: 2014-05-07
  Administered 2014-05-04 – 2014-05-06 (×5): 150 mg via ORAL
  Filled 2014-05-04 (×8): qty 10

## 2014-05-04 MED ORDER — ARFORMOTEROL TARTRATE 15 MCG/2ML IN NEBU
15.0000 ug | INHALATION_SOLUTION | Freq: Two times a day (BID) | RESPIRATORY_TRACT | Status: DC
  Administered 2014-05-04 – 2014-05-06 (×5): 15 ug via RESPIRATORY_TRACT
  Filled 2014-05-04 (×5): qty 2

## 2014-05-04 MED ORDER — BUDESONIDE 0.5 MG/2ML IN SUSP
0.5000 mg | Freq: Four times a day (QID) | RESPIRATORY_TRACT | Status: DC
  Administered 2014-05-04 – 2014-05-06 (×9): 0.5 mg via RESPIRATORY_TRACT
  Filled 2014-05-04 (×8): qty 2

## 2014-05-04 MED ORDER — METHYLPREDNISOLONE SODIUM SUCC 40 MG IJ SOLR
40.0000 mg | Freq: Three times a day (TID) | INTRAMUSCULAR | Status: DC
  Administered 2014-05-04 – 2014-05-06 (×6): 40 mg via INTRAVENOUS
  Filled 2014-05-04 (×6): qty 1

## 2014-05-04 MED ORDER — ALBUTEROL SULFATE (2.5 MG/3ML) 0.083% IN NEBU
2.5000 mg | INHALATION_SOLUTION | RESPIRATORY_TRACT | Status: DC | PRN
  Administered 2014-05-07 – 2014-05-09 (×6): 2.5 mg via RESPIRATORY_TRACT
  Filled 2014-05-04 (×5): qty 3

## 2014-05-04 MED ORDER — LEVOFLOXACIN IN D5W 750 MG/150ML IV SOLN
750.0000 mg | INTRAVENOUS | Status: DC
  Administered 2014-05-06: 750 mg via INTRAVENOUS
  Filled 2014-05-04: qty 150

## 2014-05-04 MED ORDER — PRAVASTATIN SODIUM 40 MG PO TABS
40.0000 mg | ORAL_TABLET | Freq: Every day | ORAL | Status: DC
  Administered 2014-05-04 – 2014-05-05 (×2): 40 mg
  Filled 2014-05-04 (×4): qty 1

## 2014-05-04 MED ORDER — VITAL HIGH PROTEIN PO LIQD
1000.0000 mL | ORAL | Status: DC
  Filled 2014-05-04: qty 1000

## 2014-05-04 MED ORDER — FAMOTIDINE IN NACL 20-0.9 MG/50ML-% IV SOLN
20.0000 mg | Freq: Two times a day (BID) | INTRAVENOUS | Status: DC
  Administered 2014-05-04: 20 mg via INTRAVENOUS

## 2014-05-04 MED ORDER — PROPOFOL 10 MG/ML IV BOLUS
INTRAVENOUS | Status: DC | PRN
  Administered 2014-05-04: 80 mg via INTRAVENOUS

## 2014-05-04 MED ORDER — METHYLPREDNISOLONE SODIUM SUCC 125 MG IJ SOLR
60.0000 mg | Freq: Four times a day (QID) | INTRAMUSCULAR | Status: DC
  Administered 2014-05-04: 60 mg via INTRAVENOUS
  Filled 2014-05-04: qty 2

## 2014-05-04 MED ORDER — FAMOTIDINE 40 MG/5ML PO SUSR: 20.0000 mg | Freq: Two times a day (BID) | ORAL | Status: DC

## 2014-05-04 MED ORDER — SODIUM CHLORIDE 0.9 % IV BOLUS (SEPSIS)
250.0000 mL | Freq: Once | INTRAVENOUS | Status: AC
Start: 2014-05-04 — End: 2014-05-04
  Administered 2014-05-04: 250 mL via INTRAVENOUS

## 2014-05-04 MED ORDER — IPRATROPIUM BROMIDE 0.02 % IN SOLN
0.5000 mg | Freq: Two times a day (BID) | RESPIRATORY_TRACT | Status: DC
  Administered 2014-05-04 – 2014-05-07 (×7): 0.5 mg via RESPIRATORY_TRACT
  Filled 2014-05-04 (×7): qty 2.5

## 2014-05-04 MED ORDER — CHLORHEXIDINE GLUCONATE 0.12 % MT SOLN
15.0000 mL | Freq: Two times a day (BID) | OROMUCOSAL | Status: DC
  Administered 2014-05-04 – 2014-05-07 (×8): 15 mL via OROMUCOSAL
  Filled 2014-05-04 (×6): qty 15

## 2014-05-04 MED ORDER — LORAZEPAM 1 MG PO TABS
1.0000 mg | ORAL_TABLET | Freq: Once | ORAL | Status: AC
  Administered 2014-05-04: 1 mg via ORAL
  Filled 2014-05-04: qty 1

## 2014-05-04 MED ORDER — PROPOFOL 10 MG/ML IV BOLUS
INTRAVENOUS | Status: AC
  Filled 2014-05-04: qty 20

## 2014-05-04 MED ORDER — VITAL HIGH PROTEIN PO LIQD
1000.0000 mL | ORAL | Status: DC
  Administered 2014-05-04 – 2014-05-05 (×4): 1000 mL
  Filled 2014-05-04 (×3): qty 1000

## 2014-05-04 MED ORDER — CETYLPYRIDINIUM CHLORIDE 0.05 % MT LIQD
7.0000 mL | Freq: Four times a day (QID) | OROMUCOSAL | Status: DC
  Administered 2014-05-04 – 2014-05-07 (×13): 7 mL via OROMUCOSAL

## 2014-05-04 MED ORDER — SERTRALINE HCL 50 MG PO TABS
50.0000 mg | ORAL_TABLET | Freq: Every day | ORAL | Status: DC
  Administered 2014-05-04 – 2014-05-05 (×2): 50 mg
  Filled 2014-05-04 (×2): qty 1

## 2014-05-04 NOTE — Progress Notes (Signed)
INITIAL NUTRITION ASSESSMENT  DOCUMENTATION CODES Per approved criteria  -Obesity Unspecified   INTERVENTION: -Initiate Vital High Protein @ 20 ml/hr via OGT and increase by 10 ml every 4 hours to goal rate of 45 ml/hr.  Tube feeding regimen provides 1080 kcal (70% of needs, 22 kcal/kg IBW), 95 grams of protein (95% est protein needs), and 903 ml of H2O.  -RD to continue to monitor  NUTRITION DIAGNOSIS: Inadequate oral intake related to inability to eat as evidenced by NPO status.   Goal: Enteral nutrition to provide 60-70% of estimated calorie needs (22-25 kcals/kg ideal body weight) and 100% of estimated protein needs, based on ASPEN guidelines for hypocaloric, high protein feeding in critically ill obese individuals   Monitor:  TF tolerance, total protein/energy intake, labs, weights  Reason for Assessment: TF Management  68 y.o. female  Admitting Dx: Cellulitis of left foot  ASSESSMENT: [name] is a 68 y.o. female presents to ED with 1 week h/o leg swelling. Worst in left foot and ankle. Has redness on top of foot, swelling up to calf as well, no pain in calf, no fevers. Chronically short of breath but states that this is not worse than baseline currently  11/16: Patient identified on the Malnutrition Screening Tool (MST) Report Pt reports UBW of 200 lb when she moved to [place] 4 years ago. Pt's weight has been stable within past year. Pt states her appetite is fair to good. Pt with PMHx of DM, previously saw RD in pulmonary rehab.Current diet order is heart healthy, patient is consuming approximately 100% of meals at this time. Labs and medications reviewed.   11/18: -Pt with encephalopathy and hypercapnic. Intubated on 11/18. Adult enteral protocol ordered. Vital High Protein not currently infusing. -Pt w/OGT, RD consulted for TF management. -Patient is currently intubated on ventilator support MV: 7.6 L/min Temp (24hrs), Avg:95.8 F (35.4 C), Min:94.1 F  (34.5 C), Max:98.2 F (36.8 C)    Height: Ht Readings from Last 1 Encounters:  05/04/14 5\' 2"  (1.575 m)    Weight: Wt Readings from Last 1 Encounters:  05/04/14 186 lb 15.9 oz (84.82 kg)    Ideal Body Weight: 110 lb  % Ideal Body Weight: 169%  Wt Readings from Last 10 Encounters:  05/04/14 186 lb 15.9 oz (84.82 kg)  04/27/14 187 lb 3.2 oz (84.913 kg)  04/21/14 180 lb 8.9 oz (81.9 kg)  03/30/14 183 lb (83.008 kg)  03/15/14 184 lb (83.462 kg)  02/16/14 191 lb (86.637 kg)  02/03/14 191 lb 9.6 oz (86.909 kg)  01/21/14 189 lb 3.2 oz (85.821 kg)  12/06/13 192 lb 12.8 oz (87.454 kg)  10/22/13 194 lb (87.998 kg)    Usual Body Weight: 200 lb  % Usual Body Weight: 93%  BMI:  Body mass index is 34.19 kg/(m^2).  Estimated Nutritional Needs: Kcal: 1473 Protein: >/= 100 gram Fluid: >/= 1500 ml daily  Skin: WDL, + 1 RLE edema, +2 LLE edema  Diet Order:    EDUCATION NEEDS: -No education needs identified at this time   Intake/Output Summary (Last 24 hours) at 05/04/14 0942 Last data filed at 05/04/14 0800  Gross per 24 hour  Intake 238.67 ml  Output   1940 ml  Net -1701.33 ml    Last BM: 11/17   Labs:   Recent Labs Lab 05/01/14 2051 05/04/14 0459  NA 140 142  K 3.8 3.7  CL 96 98  CO2 34* 34*  BUN 19 19  CREATININE 1.00 1.19*  CALCIUM 9.0 8.5  GLUCOSE 126* 161*    CBG (last 3)   Recent Labs  05/04/14 0243 05/04/14 0557 05/04/14 0742  GLUCAP 168* 158* 141*    Scheduled Meds: . antiseptic oral rinse  7 mL Mouth Rinse QID  . arformoterol  15 mcg Nebulization BID  . budesonide (PULMICORT) nebulizer solution  0.5 mg Nebulization QID  . chlorhexidine  15 mL Mouth Rinse BID  . feeding supplement (VITAL HIGH PROTEIN)  1,000 mL Per Tube Q24H  . heparin  5,000 Units Subcutaneous 3 times per day  . insulin aspart  0-15 Units Subcutaneous 6 times per day  . ipratropium  0.5 mg Nebulization Q12H  . levofloxacin (LEVAQUIN) IV  750 mg Intravenous Q24H   . methylPREDNISolone (SOLU-MEDROL) injection  40 mg Intravenous 3 times per day  . pravastatin  40 mg Per Tube q1800  . ranitidine  150 mg Oral BID  . sertraline  50 mg Per Tube QHS  . sodium chloride  3 mL Intravenous Q12H    Continuous Infusions: . sodium chloride 1,000 mL (05/04/14 1601)    Past Medical History  Diagnosis Date  . Asthma   . COPD (chronic obstructive pulmonary disease)   . Hypertension   . Diabetes mellitus 09/13/2010    glucophage  . Gout   . Oxygen dependent     pt uses O2   . Pericardial effusion 08/2011    small residual on echo  . Hyperlipidemia   . Type II or unspecified type diabetes mellitus with neurological manifestations, uncontrolled   . Personal history of noncompliance with medical treatment, presenting hazards to health   . Unspecified disease of pericardium   . Hemorrhage of rectum and anus   . Panic disorder without agoraphobia   . Depressive disorder, not elsewhere classified   . Edema   . Anxiety state, unspecified   . Unspecified hereditary and idiopathic peripheral neuropathy   . Type II or unspecified type diabetes mellitus without mention of complication, not stated as uncontrolled   . Other and unspecified hyperlipidemia   . Obesity, unspecified   . Unspecified tinnitus   . Unspecified essential hypertension   . Pneumonia, organism unspecified   . Chronic obstructive asthma, unspecified   . Acute and chronic respiratory failure   . Urinary frequency   . Urgency of urination   . Anemia due to chronic blood loss 01/04/2013    Past Surgical History  Procedure Laterality Date  . Breast biopsy      both breasts  . Total abdominal hysterectomy    . Esophagogastroduodenoscopy N/A 01/03/2013    Procedure: ESOPHAGOGASTRODUODENOSCOPY (EGD);  Surgeon: Inda Castle, MD;  Location: [place];  Service: Endoscopy;  Laterality: N/A;  . Colonoscopy N/A 01/05/2013    Procedure: COLONOSCOPY;  Surgeon: Gatha Mayer, MD;  Location: [place];  Service: Endoscopy;  Laterality: N/A;    Atlee Abide MS RD LDN Clinical Dietitian [phone]

## 2014-05-04 NOTE — Plan of Care (Signed)
Problem: Consults Goal: Diabetes Guidelines if Diabetic/Glucose > 140 If diabetic or lab glucose is > 140 mg/dl - Initiate Diabetes/Hyperglycemia Guidelines & Document Interventions  Outcome: Completed/Met Date Met:  05/04/14

## 2014-05-04 NOTE — Progress Notes (Signed)
Event:  Notified by RN earlier tonight re: pt's frequent c/o SOB. RN reports pt w/ c/o persistent SOB most of the day. There were no c/o CP. RN reports BBS diminished w/ RR of 26-28 w/ 02 sats WNL.  Pt remains alert and oriented. Based on previous CXR pt given Lasix 40 mg IV and diuresed approx 1200 cc. RN reports pt seems better but very anxious and continues to report SOB. PCXR revealed severe cardiolmegaly w/ pulmonary vascular congestion, bil pleural effusions and basilar infiltrates (similar to previous study). Pt given Ativan 1 mg po at approx 0115. Notified at approx 0300 that pt now w/ increased WOB, diaphoresis and increased lethargy. RRT and RR RN have been paged and are at bedside. Stat ABG requested. NP to bedside. Subjective/Objective: [name] is a 68 y/o pt w/ a complicated past medical history admitted on 05/01/2014 for LLE cellulitis. Pt recently hospitalized by PCCM for A/C hypoxic respiratory failure and fluid overload suspected to be related to diastolic dysfunction. Pt is on home 02 (2L [place]). [name], RR RN reports 02 sats noted to be 79% on 3L [place] upon his arrival to room. Pt was placed on NRB. At bedside pt is lethargic, essentially unresponsive w/ rapid, shallow respirations w/ minimal air movement. BP 169/84, P-120's, R-35 w/ 02 sats of 100% on NRB. EKG reveals ST w/ rate of 122. ABG reveals pH-7.14, pC02-"too high to report" and p02- 92.8.Pt placed on BiPAP for transport to ICU.  Assessment/Plan: 1. Acute on chronic hypercarbic respiratory failure: In setting of severe COPD and pulmonary edema. Pt too lethargic for BiPAP. Discussed pt w/ Dr Alva Garnet w/ [name] who agreed to have pt evaluated tonight. Anesthesia was notified and pt was successfully intubated. Courtney Hora, PA w/ CCM arrived to bedside during intubation. At the time of my departure pt on full vent support and being prepared for CVL placement. RN updated pt's sister of change in pt's status prior to intubation. CCM to  manage while on vent.   Courtney Columbia, NP-C [facility] Pager [phone]

## 2014-05-04 NOTE — Consult Note (Signed)
PULMONARY / CRITICAL CARE MEDICINE   Name: [name] MRN: [id number] DOB: [birth year]    ADMISSION DATE:  05/01/2014 CONSULTATION DATE:  05/04/2014  REFERRING MD :  TRH  CHIEF COMPLAINT:  Respiratory Failure  INITIAL PRESENTATION:   68 yo female presented with LLE cellulitis.  Developed anxiety/tachypnea in hospital tx with ativan >> developed encephalopathy 2nd to hypercapnia requiring intubation.  PCCM assumed care in ICU.  She had recent admit for AECOPD (FEV1 0.85, FEV1% 47 from 2012).  She is followed by Dr. 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  STUDIES:  L foot XR 11/15 >>> soft tissue swelling, no osseous abnormality LE Duplex 11/17 >>> neg for DVT Echo 11/18 >>>  SIGNIFICANT EVENTS: 11/15 admitted with cellulitis of LLE 11/18 transferred to ICU, intubated   HISTORY OF PRESENT ILLNESS:  Pt is encephalopathic; therefore, this HPI is obtained from chart review. [name] is a 68 y.o. F with severe COPD (2L O2 dependent) who was recently admitted to [facility] with AECOPD, discharged 11/5. She was readmitted 11/15 after she went to [facility] ED with LLE swelling and erythema and was admitted for LLE cellulitis. On evening of 11/18, she was very anxious and tachypneic.  She received a dose of ativan and later became very somnolent.  ABG revealed hypercarbic respiratory failure.  She was transferred to the ICU and intubated for acute on chronic hypercarbic respiratory failure (7.14 / too high to calculate / 92).  PFT's 2012: FEV1 .85, ratio 47%.   PAST MEDICAL HISTORY :   has a past medical history of Asthma; COPD (chronic obstructive pulmonary disease); Hypertension; Diabetes mellitus (09/13/2010); Gout; Oxygen dependent; Pericardial effusion (08/2011); Hyperlipidemia; Type II or unspecified type diabetes mellitus with neurological manifestations, uncontrolled; Personal history of noncompliance with medical treatment, presenting hazards to health;  Unspecified disease of pericardium; Hemorrhage of rectum and anus; Panic disorder without agoraphobia; Depressive disorder, not elsewhere classified; Edema; Anxiety state, unspecified; Unspecified hereditary and idiopathic peripheral neuropathy; Type II or unspecified type diabetes mellitus without mention of complication, not stated as uncontrolled; Other and unspecified hyperlipidemia; Obesity, unspecified; Unspecified tinnitus; Unspecified essential hypertension; Pneumonia, organism unspecified; Chronic obstructive asthma, unspecified; Acute and chronic respiratory failure; Urinary frequency; Urgency of urination; and Anemia due to chronic blood loss (01/04/2013).  has past surgical history that includes Breast biopsy; Total abdominal hysterectomy; Esophagogastroduodenoscopy (N/A, 01/03/2013); and Colonoscopy (N/A, 01/05/2013). Prior to Admission medications   Medication Sig Start Date End Date Taking? Authorizing Provider  albuterol (PROAIR HFA) 108 (90 BASE) MCG/ACT inhaler Inhale 2 puffs into the lungs every 4 (four) hours as needed for shortness of breath. 04/27/14  Yes Tammy S Parrett, NP  albuterol (PROVENTIL) (2.5 MG/3ML) 0.083% nebulizer solution Take 2.5 mg by nebulization every 6 (six) hours as needed for shortness of breath.    Yes Historical Provider, MD  budesonide-formoterol (SYMBICORT) 80-4.5 MCG/ACT inhaler Inhale 2 puffs into the lungs 2 (two) times daily. 04/05/14  Yes [name] L Reed, DO  hydrALAZINE (APRESOLINE) 25 MG tablet Take 1 tablet (25 mg total) by mouth every 8 (eight) hours. 01/21/14  Yes [name] L Reed, DO  lovastatin (MEVACOR) 40 MG tablet Take 1 tablet (40 mg total) by mouth at bedtime. For hyperlipedemia 10/27/13  Yes [name] L Reed, DO  Multiple Vitamins-Minerals (MULTIVITAMIN ADULTS 50+) TABS Take 1 tablet by mouth daily.   Yes Historical Provider, MD  pantoprazole (PROTONIX) 40 MG tablet Take 1 tablet (40 mg total) by mouth daily. 02/16/14  Yes  Jerene Bears, MD   promethazine-codeine (PHENERGAN WITH CODEINE) 6.25-10 MG/5ML syrup Take 5 mLs by mouth every 6 (six) hours as needed for cough. 04/14/14  Yes Deneise Lever, MD  sertraline (ZOLOFT) 50 MG tablet Take 1 tablet (50 mg total) by mouth at bedtime. 01/21/14  Yes [name] L Reed, DO  tiotropium (SPIRIVA) 18 MCG inhalation capsule Place 1 capsule (18 mcg total) into inhaler and inhale daily. 10/22/13  Yes [name] L Reed, DO   Allergies  Allergen Reactions  . Lisinopril Swelling    Angioedema   . Advair Diskus [Fluticasone-Salmeterol] Other (See Comments)    Nose bleeds.   . Penicillins     hives  . Levaquin [Levofloxacin] Other (See Comments)    FAMILY HISTORY:  Family History  Problem Relation Age of Onset  . COPD Mother   . COPD Sister   . Hypertension Sister   . Cancer Father   . Asthma Father   . Diabetes Other     3 maternal aunts  . Prostate cancer Paternal Uncle     SOCIAL HISTORY:  reports that she quit smoking about 9 years ago. Her smoking use included Cigarettes. She has a 30 pack-year smoking history. She has never used smokeless tobacco. She reports that she drinks about 1.0 oz of alcohol per week. She reports that she does not use illicit drugs.  REVIEW OF SYSTEMS:  Unable to complete as pt is encephalopathic.  SUBJECTIVE:   VITAL SIGNS: Temp:  [97.9 F (36.6 C)-98.2 F (36.8 C)] 98 F (36.7 C) (11/18 0255) Pulse Rate:  [91-119] 96 (11/18 0355) Resp:  [16-33] 16 (11/18 0355) BP: (65-178)/(33-94) 65/33 mmHg (11/18 0355) SpO2:  [79 %-98 %] 98 % (11/18 0355) FiO2 (%):  [100 %] 100 % (11/18 0355) HEMODYNAMICS:   VENTILATOR SETTINGS: Vent Mode:  [-] PRVC FiO2 (%):  [100 %] 100 % Set Rate:  [16 bmp] 16 bmp Vt Set:  [470 mL] 470 mL PEEP:  [5 cmH20] 5 cmH20 Plateau Pressure:  [25 cmH20] 25 cmH20 INTAKE / OUTPUT: Intake/Output      11/17 0701 - 11/18 0700   Urine (mL/kg/hr) 1700 (0.8)   Total Output 1700   Net -1700       Urine Occurrence 1 x     PHYSICAL  EXAMINATION: General: WDWN female, minimally responsive. Neuro: Sedated, awakens to physical stimuli. HEENT: [place]/AT. PERRL, sclerae anicteric. Cardiovascular: Tachy bur regular, no M/R/G.  Lungs: Respirations shallow, diffuse crackles.   Abdomen: BS x 4, soft, NT/ND.  Musculoskeletal: No gross deformities, LLE edema > RLE. Skin: Intact, LLE warm, no rashes.  LABS:  CBC  Recent Labs Lab 05/01/14 2051 05/02/14 0454  WBC 11.3* 10.5  HGB 10.4* 10.1*  HCT 34.1* 32.7*  PLT 250 262   Coag's No results for input(s): APTT, INR in the last 168 hours.   BMET  Recent Labs Lab 05/01/14 2051  NA 140  K 3.8  CL 96  CO2 34*  BUN 19  CREATININE 1.00  GLUCOSE 126*   Electrolytes  Recent Labs Lab 05/01/14 2051  CALCIUM 9.0   Sepsis Markers  Recent Labs Lab 05/01/14 2101  LATICACIDVEN <0.30*   ABG  Recent Labs Lab 05/04/14 0247  PHART 7.149*  PCO2ART ABOVE REPORTABLE RANGE.  PO2ART 92.8   Liver Enzymes  Recent Labs Lab 05/01/14 2051  AST 16  ALT 17  ALKPHOS 70  BILITOT <0.2*  ALBUMIN 3.3*   Cardiac Enzymes No results for input(s): TROPONINI, PROBNP in  the last 168 hours.   Glucose  Recent Labs Lab 05/04/14 0243  GLUCAP 168*    Imaging Portable Chest Xray  05/04/2014   CLINICAL DATA:  Acute respiratory failure.  EXAM: PORTABLE CHEST - 1 VIEW  COMPARISON:  05/04/2014  FINDINGS: Interval placement of an endotracheal tube with tip measuring 5 cm above the carinal. Enteric tube is been placed. Tip is off the field of view but below the left hemidiaphragm. Persistent cardiac enlargement and pulmonary vascular congestion with increasing edema in the right lung. Small pleural effusion again demonstrated.  IMPRESSION: Appliances appear in satisfactory location. Cardiac enlargement and pulmonary vascular congestion with increasing edema.   Electronically Signed   By: Lucienne Capers M.D.   On: 05/04/2014 04:08   Dg Chest Port 1 View  05/04/2014   CLINICAL  DATA:  Worsening shortness of breath.  EXAM: PORTABLE CHEST - 1 VIEW  COMPARISON:  04/19/2014  FINDINGS: Marked cardiac enlargement with mild-to-moderate pulmonary vascular congestion. Bilateral basilar opacities again demonstrated consistent with edema or atelectasis. Small bilateral pleural effusions. Similar appearance to previous study. No pneumothorax. Calcified and tortuous aorta.  IMPRESSION: Cardiac enlargement with pulmonary vascular congestion, small bilateral pleural effusions, and basilar infiltrates similar to previous study.   Electronically Signed   By: Lucienne Capers M.D.   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 P:   Full vent support Monitor for PEEPi Scheduled BDs with brovana, pulmicort, atrovent, and prn albuterol Solumedrol 40 mg q8h Hold outpatient symbicort, spiriva.  CARDIOVASCULAR CVL L IJ 11/18 >>> A:  Hx of HTN, HLD. Enlarged cardiac shadow on CXR with hx of pericardial effusion. Hypotension after intubation 11/18. P:  F/u Echo Monitor CVP Continue pravastatin Hold outpatient hydralazine.  RENAL A:   AKI >> baseline creatinine 1 from 05/01/14. P:   Monitor renal fx, urine outpt, electrolytes  GASTROINTESTINAL A:   Nutrition. P:   Pepcid for SUP Tube feeds while on vent  HEMATOLOGIC A:   Anemia of chronic disease and critical illness. P:  F/u CBC SG heparin for DVT prevention  INFECTIOUS A:   LLE Cellulitis. P:   Day 4 of Abx, currently on levaquin  Sputum 11/18 >>  ENDOCRINE A:   DM type II with steroid induced hyperglycemia. Hx gout P:  SSI  NEUROLOGIC A:   Acute metabolic encephalopathy 2nd to respiratory failure. Hx Depression, anxiety, panic attacks. P:   Sedation:  Fentanyl PRN. RASS goal: 0 to -1. Daily WUA. Continue zoloft   Family updated: Elberta Spaniel over phone by RN prior to intubation  11/18  Interdisciplinary Family Meeting v Palliative Care Meeting:  Due by: 11/24.   Montey Hora, [place] [facility] Pgr: [phone]  or [phone] 05/04/2014, 4:25 AM   Reviewed above, examined, note edited.  68 yo female with hx of GOLD 4 COPD admitted with Lt leg cellulitis.  Has hx of anxiety/panic attacks.  Was given ativan in PM of 11/17.  Developed altered mental status with hypercapnia/hypoxia, requiring intubation.  She was noted to have enlarged cardiac shadow on CXR, and there was concern for AECOPD.  She had transient hypotension after intubation >> improved since.  Her mental status has improved with ventilatory support.  Tried her on pressure support this AM, but she was not able to tolerate >> low Vt, hypoxia.  Her Lt leg still has some swelling, but no increased warmth or erythema.  Will continue on full vent support, adjust BD regimen and solumedrol, continue Abx, f/u Echo, start tube feeds.  I am hopefully she will be able to proceed with weaning trials and extubation soon, but am concerned this might difficult given the severity of her COPD.  CC time by me independent of APP time is 40 minutes.  [name], MD [facility] Pulmonary/Critical Care 05/04/2014, 7:47 AM Pager:  [phone] After 3pm call: [phone]

## 2014-05-04 NOTE — Progress Notes (Addendum)
ANTIBIOTIC CONSULT NOTE - FOLLOW UP  Pharmacy Consult for Levaquin Indication: cellulitis  Allergies  Allergen Reactions  . Lisinopril Swelling    Angioedema   . Advair Diskus [Fluticasone-Salmeterol] Other (See Comments)    Nose bleeds.   . Penicillins     hives  . Levaquin [Levofloxacin] Other (See Comments)    Patient Measurements: Height: 5\' 2"  (157.5 cm) Weight: 186 lb 15.9 oz (84.82 kg) IBW/kg (Calculated) : 50.1  Vital Signs: Temp: 100 F (37.8 C) (11/18 1200) Temp Source: Core (Comment) (11/18 1200) BP: 118/71 mmHg (11/18 1200) Pulse Rate: 89 (11/18 1200) Intake/Output from previous day: 11/17 0701 - 11/18 0700 In: 208.7 [I.V.:8.7; IV Piggyback:200] Out: 1700 [Urine:1700]  Labs:  Recent Labs  05/01/14 2051 05/02/14 0454 05/04/14 0459  WBC 11.3* 10.5 9.1  HGB 10.4* 10.1* 8.9*  PLT 250 262 227  CREATININE 1.00  --  1.19*   Estimated Creatinine Clearance: 45.7 mL/min (by C-G formula based on Cr of 1.19).   Assessment: 66 yoF admitted 11/15 with LLE cellulitis, initially started on clindamycin.  She developed encephalopathy, hypercapnia, requiring intubation on 11/17.  Pharmacy is consulted to dose Levaquin for left foot cellulitis.  Pt recently completed course of Levaquin after hospital admission and reports Levaquin caused rash and throat tightness. Pt tolerated Levquin inpatient.  MD aware and wants to proceed.   11/15 >> Clindamycin >> 11/18 11/18 >> Levaquin  >>   Today, 05/04/2014 : Day 1 Levaquin  Tmax: 100.4  WBCs: 9.1  Renal: SCr 1.19 (increased) with CRCl ~ 45 ml/min  Goal of Therapy:  Appropriate abx dosing, eradication of infection.   Plan:   Decrease to Levaquin 750mg  IV q48h.  Follow up renal function and cultures as available.  Gretta Arab PharmD, BCPS Pager [phone] 05/04/2014 12:10 PM

## 2014-05-04 NOTE — Progress Notes (Signed)
Echo Lab  2D Echocardiogram completed.  Larrie Fraizer L Joann Jorge, RDCS 05/04/2014 9:10 AM

## 2014-05-04 NOTE — Progress Notes (Signed)
On call NP made aware about patient's progress- urine output from lasix and cont c/o SOB. New order for [facility] received. O2 sat staying at 92-95% at 3L/min via n/c.

## 2014-05-04 NOTE — Progress Notes (Signed)
Patient's emergency contact/sister Kendrick Fries made aware about patients change in condition and transfer to ICU. Verbalized understanding.

## 2014-05-04 NOTE — Procedures (Signed)
Central Venous Catheter Insertion Procedure Note [name] [id number] 1946/01/01  Procedure: Insertion of Central Venous Catheter Indications: Assessment of intravascular volume, Drug and/or fluid administration and Frequent blood sampling  Procedure Details Consent: Unable to obtain consent because of altered level of consciousness. Time Out: Verified patient identification, verified procedure, site/side was marked, verified correct patient position, special equipment/implants available, medications/allergies/relevent history reviewed, required imaging and test results available.  Performed  Maximum sterile technique was used including antiseptics, cap, gloves, gown, hand hygiene, mask and sheet. Skin prep: Chlorhexidine; local anesthetic administered A antimicrobial bonded/coated triple lumen catheter was placed in the left internal jugular vein using the Seldinger technique.  Evaluation Blood flow good Complications: No apparent complications Patient did tolerate procedure well. Chest X-ray ordered to verify placement.  CXR: pending.  Procedure performed under direct ultrasound guidance for real time vessel cannulation.      Montey Hora, [place] [facility] Pgr: [phone]  or [phone] 05/04/2014, 4:24 AM

## 2014-05-04 NOTE — Progress Notes (Signed)
ANTIBIOTIC CONSULT NOTE - INITIAL  Pharmacy Consult for Levaquin Indication: Left foot cellulitis  Allergies  Allergen Reactions  . Lisinopril Swelling    Angioedema   . Advair Diskus [Fluticasone-Salmeterol] Other (See Comments)    Nose bleeds.   . Penicillins     hives  . Levaquin [Levofloxacin] Other (See Comments)    Patient Measurements: Height: 5\' 2"  (157.5 cm) Weight: 186 lb 15.9 oz (84.82 kg) IBW/kg (Calculated) : 50.1   Vital Signs: Temp: 94.1 F (34.5 C) (11/18 0500) Temp Source: Axillary (11/18 0255) BP: 90/54 mmHg (11/18 0500) Pulse Rate: 88 (11/18 0500) Intake/Output from previous day: 11/17 0701 - 11/18 0700 In: 200 [IV Piggyback:200] Out: 1700 [Urine:1700] Intake/Output from this shift: Total I/O In: 200 [IV Piggyback:200] Out: 1500 [Urine:1500]  Labs:  Recent Labs  05/01/14 2051 05/02/14 0454 05/04/14 0459  WBC 11.3* 10.5 9.1  HGB 10.4* 10.1* 8.9*  PLT 250 262 227  CREATININE 1.00  --  1.19*   Estimated Creatinine Clearance: 45.7 mL/min (by C-G formula based on Cr of 1.19). No results for input(s): VANCOTROUGH, VANCOPEAK, VANCORANDOM, GENTTROUGH, GENTPEAK, GENTRANDOM, TOBRATROUGH, TOBRAPEAK, TOBRARND, AMIKACINPEAK, AMIKACINTROU, AMIKACIN in the last 72 hours.   Microbiology: Recent Results (from the past 720 hour(s))  MRSA PCR Screening     Status: None   Collection Time: 04/18/14  6:29 PM  Result Value Ref Range Status   MRSA by PCR NEGATIVE NEGATIVE Final    Comment:        The GeneXpert MRSA Assay (FDA approved for NASAL specimens only), is one component of a comprehensive MRSA colonization surveillance program. It is not intended to diagnose MRSA infection nor to guide or monitor treatment for MRSA infections.     Medical History: Past Medical History  Diagnosis Date  . Asthma   . COPD (chronic obstructive pulmonary disease)   . Hypertension   . Diabetes mellitus 09/13/2010    glucophage  . Gout   . Oxygen dependent      pt uses O2   . Pericardial effusion 08/2011    small residual on echo  . Hyperlipidemia   . Type II or unspecified type diabetes mellitus with neurological manifestations, uncontrolled   . Personal history of noncompliance with medical treatment, presenting hazards to health   . Unspecified disease of pericardium   . Hemorrhage of rectum and anus   . Panic disorder without agoraphobia   . Depressive disorder, not elsewhere classified   . Edema   . Anxiety state, unspecified   . Unspecified hereditary and idiopathic peripheral neuropathy   . Type II or unspecified type diabetes mellitus without mention of complication, not stated as uncontrolled   . Other and unspecified hyperlipidemia   . Obesity, unspecified   . Unspecified tinnitus   . Unspecified essential hypertension   . Pneumonia, organism unspecified   . Chronic obstructive asthma, unspecified   . Acute and chronic respiratory failure   . Urinary frequency   . Urgency of urination   . Anemia due to chronic blood loss 01/04/2013    Medications:  Scheduled:  . antiseptic oral rinse  7 mL Mouth Rinse QID  . antiseptic oral rinse  7 mL Mouth Rinse QID  . budesonide  0.25 mg Nebulization 4 times per day  . chlorhexidine  15 mL Mouth Rinse BID  . chlorhexidine  15 mL Mouth Rinse BID  . famotidine (PEPCID) IV  20 mg Intravenous Q12H  . heparin  5,000 Units Subcutaneous 3 times per day  .  insulin aspart  0-15 Units Subcutaneous 6 times per day  . ipratropium-albuterol  3 mL Nebulization Q6H  . levofloxacin (LEVAQUIN) IV  750 mg Intravenous Q24H  . methylPREDNISolone (SOLU-MEDROL) injection  60 mg Intravenous 4 times per day  . pravastatin  40 mg Per Tube q1800  . sertraline  50 mg Per Tube QHS  . sodium chloride  3 mL Intravenous Q12H   Infusions:  . sodium chloride 50 mL/hr at 05/04/14 0111   Assessment: 59 yoM now intubated for acute on chronic hypercarbic respiratory failure.  Levaquin per Rx for left foot  cellulitis.   Goal of Therapy:  Treat infection  Plan:   Levaquin 750 mg IV q24h  F/u SCr/cultures  Lawana Pai R 05/04/2014,5:38 AM

## 2014-05-04 NOTE — Progress Notes (Signed)
Paged on call NP about result of CXR. New order received: Ativan 1 mg po for anxiety. Patient cont to put out urine using the [facility] with minimal assist. Obvious SOB noted.   05/04/14 0115  Vitals  BP (!) 172/92 mmHg  BP Location Left Arm  BP Method Automatic  Patient Position (if appropriate) Sitting  Pulse Rate (!) 108  Resp (!) 28  Oxygen Therapy  SpO2 95 %  O2 Device Nasal Cannula  O2 Flow Rate (L/min) 3 L/min

## 2014-05-04 NOTE — Progress Notes (Deleted)
Paged on call NP about result of CXR. New order received: Ativan 1 mg po for anxiety. Patient cont to put out urine using the [facility] with minimal assist. Obvious SOB noted.   05/04/14 0115  Vitals  BP (!) 172/92 mmHg  BP Location Left Arm  BP Method Automatic  Patient Position (if appropriate) Sitting  Pulse Rate (!) 108  Resp (!) 28  Oxygen Therapy  SpO2 95 %  O2 Device Nasal Cannula  O2 Flow Rate (L/min) 3 L/min

## 2014-05-04 NOTE — Plan of Care (Signed)
Problem: Phase I Progression Outcomes Goal: Oral Care per Protocol Outcome: Completed/Met Date Met:  05/04/14 Goal: HOB elevated 30 degrees Outcome: Completed/Met Date Met:  05/04/14 Goal: VAP prevention protocol initiated Outcome: Completed/Met Date Met:  05/04/14

## 2014-05-04 NOTE — Progress Notes (Signed)
[place] Progress Note Patient Name: [name] DOB: 1946-03-15 MRN: [id number]   Date of Service  05/04/2014  HPI/Events of Note  -Pt admitted to [facility] 11/15 with cellulitis -Transferred to ICU with acute on chronic hypercarbic respiratory failure -Anesthesia has been called for intubation -CXR notable for severe cardiomegaly -PCN allergy noted  eICU Interventions  -PCCM called for consultation -vent orders, vent bundle, sedation protocol -Change clindamycin to levofloxacin -Bronchodilator regimen adjusted -EKG, trop I ordered -Echocardiogram ordered     Intervention Category Evaluation Type: New Patient Evaluation  Merton Border 05/04/2014, 3:34 AM

## 2014-05-04 NOTE — Significant Event (Signed)
I spoke with pt's daughter, Danae Chen, [phone]).  Updated her about pt's status and current tx plan.  She reports that pt's sister, Kendrick Fries, will be coming from [place] once she can make arrangements for child care of her grandson.  [name], MD [facility] Pulmonary/Critical Care 05/04/2014, 11:11 AM Pager:  [phone] After 3pm call: [phone]

## 2014-05-05 ENCOUNTER — Inpatient Hospital Stay (HOSPITAL_COMMUNITY): Payer: Medicare Other

## 2014-05-05 DIAGNOSIS — I319 Disease of pericardium, unspecified: Secondary | ICD-10-CM

## 2014-05-05 LAB — CBC
HCT: 29 % — ABNORMAL LOW (ref 36.0–46.0)
Hemoglobin: 9.1 g/dL — ABNORMAL LOW (ref 12.0–15.0)
MCH: 27.6 pg (ref 26.0–34.0)
MCHC: 31.4 g/dL (ref 30.0–36.0)
MCV: 87.9 fL (ref 78.0–100.0)
Platelets: 252 10*3/uL (ref 150–400)
RBC: 3.3 MIL/uL — AB (ref 3.87–5.11)
RDW: 15.8 % — AB (ref 11.5–15.5)
WBC: 9.2 10*3/uL (ref 4.0–10.5)

## 2014-05-05 LAB — BASIC METABOLIC PANEL
ANION GAP: 10 (ref 5–15)
BUN: 34 mg/dL — ABNORMAL HIGH (ref 6–23)
CALCIUM: 9 mg/dL (ref 8.4–10.5)
CHLORIDE: 98 meq/L (ref 96–112)
CO2: 33 meq/L — AB (ref 19–32)
Creatinine, Ser: 1.35 mg/dL — ABNORMAL HIGH (ref 0.50–1.10)
GFR calc Af Amer: 46 mL/min — ABNORMAL LOW (ref >90)
GFR, EST NON AFRICAN AMERICAN: 39 mL/min — AB (ref >90)
Glucose, Bld: 190 mg/dL — ABNORMAL HIGH (ref 70–99)
POTASSIUM: 3.7 meq/L (ref 3.7–5.3)
Sodium: 141 mEq/L (ref 137–147)

## 2014-05-05 LAB — BLOOD GAS, ARTERIAL
ACID-BASE EXCESS: 7.6 mmol/L — AB (ref 0.0–2.0)
Bicarbonate: 32.6 mEq/L — ABNORMAL HIGH (ref 20.0–24.0)
Drawn by: 31814
FIO2: 0.5 %
MECHVT: 470 mL
O2 Saturation: 94 %
PCO2 ART: 48.5 mmHg — AB (ref 35.0–45.0)
PEEP: 5 cmH2O
PH ART: 7.438 (ref 7.350–7.450)
PO2 ART: 64.6 mmHg — AB (ref 80.0–100.0)
Patient temperature: 36.2
RATE: 16 resp/min
TCO2: 30.3 mmol/L (ref 0–100)

## 2014-05-05 LAB — GLUCOSE, CAPILLARY
GLUCOSE-CAPILLARY: 144 mg/dL — AB (ref 70–99)
Glucose-Capillary: 129 mg/dL — ABNORMAL HIGH (ref 70–99)
Glucose-Capillary: 176 mg/dL — ABNORMAL HIGH (ref 70–99)
Glucose-Capillary: 145 mg/dL — ABNORMAL HIGH (ref 70–99)
Glucose-Capillary: 127 mg/dL — ABNORMAL HIGH (ref 70–99)
Glucose-Capillary: 165 mg/dL — ABNORMAL HIGH (ref 70–99)

## 2014-05-05 MED ORDER — HYDRALAZINE HCL 20 MG/ML IJ SOLN
10.0000 mg | INTRAMUSCULAR | Status: DC | PRN
  Administered 2014-05-05: 20 mg via INTRAVENOUS
  Administered 2014-05-05 – 2014-05-06 (×3): 40 mg via INTRAVENOUS
  Administered 2014-05-06 – 2014-05-07 (×2): 20 mg via INTRAVENOUS
  Filled 2014-05-05: qty 2
  Filled 2014-05-05 (×2): qty 1
  Filled 2014-05-05: qty 2
  Filled 2014-05-05: qty 1
  Filled 2014-05-05 (×4): qty 2
  Filled 2014-05-05 (×3): qty 1

## 2014-05-05 MED ORDER — METOPROLOL TARTRATE 1 MG/ML IV SOLN
2.5000 mg | INTRAVENOUS | Status: DC | PRN
  Administered 2014-05-05 – 2014-05-06 (×3): 5 mg via INTRAVENOUS
  Filled 2014-05-05 (×4): qty 5

## 2014-05-05 MED ORDER — HYDRALAZINE HCL 25 MG PO TABS
25.0000 mg | ORAL_TABLET | Freq: Three times a day (TID) | ORAL | Status: DC
  Administered 2014-05-05 – 2014-05-09 (×14): 25 mg via ORAL
  Filled 2014-05-05 (×15): qty 1

## 2014-05-05 NOTE — Progress Notes (Signed)
[place] Progress Note Patient Name: [name] DOB: Aug 28, 1945 MRN: [id number]   Date of Service  05/05/2014  HPI/Events of Note    eICU Interventions  PRN hydralazine PRN metoprolol     Intervention Category Major Interventions: Hypertension - evaluation and management  Merton Border 05/05/2014, 12:39 AM

## 2014-05-05 NOTE — Care Management Note (Addendum)
    Page 1 of 2   05/16/2014     2:31:14 PM CARE MANAGEMENT NOTE 05/16/2014  Patient:  [name], [name]   Account Number:  [id number]  Date Initiated:  05/05/2014  Documentation initiated by:  DAVIS,RHONDA  Subjective/Objective Assessment:   11/15 admitted with cellulitis of LLE  11/18 transferred to ICU, intubated     Action/Plan:   tbd, in process of weaning with current failure of waening.   Anticipated DC Date:  05/08/2014   Anticipated DC Plan:    In-house referral  NA      DC Planning Services  CM consult      PAC Choice  NA   Choice offered to / List presented to:  NA   DME arranged  NA      DME agency  NA     [place] arranged  NA      [place] agency  NA   Status of service:  In process, will continue to follow Medicare Important Message given?  YES (If response is "NO", the following Medicare IM given date fields will be blank) Date Medicare IM given:  05/16/2014 Medicare IM given by:  GRAVES-BIGELOW,Trinika Cortese Date Additional Medicare IM given:   Additional Medicare IM given by:    Discharge Disposition:  [place]  Per UR Regulation:  Reviewed for med. necessity/level of care/duration of stay  If discussed at [place] of Stay Meetings, dates discussed:   05/17/2014    Comments:  05-16-14 [place], RN,BSN [phone] Plan for SNF Tuesday 05-17-14. CM will continue to monitor.  [id number] Rosana Hoes, RN, BSN, CCM Chart reviewed. Discharge needs and patient's stay to be reviewed and followed by case manager. Chart note for progression of stay: SIGNIFICANT EVENTS: 11/15 admitted with cellulitis of LLE 11/18 transferred to ICU, intubated REVIEW OF SYSTEMS:  Pt vented/sedated, upon arousal answers no to pain, yes to anxiety SUBJECTIVE:  Pt unable to wean off vent - becomes hypoxic, tachypnic.  Pt became agitated and hypertensive last night with bathing, was given hydralazine, metoprolol.  Pt also experienced CP with  diaphoresis while hypertensive, ECG showed sinus tach w/o ST elevation or T wave changes, pt was treated with fentanyl and versed, no longer complaining of CP.

## 2014-05-05 NOTE — Consult Note (Signed)
CONSULTATION NOTE  Reason for Consult: Pericardial effusion  Requesting Physician: Dr. Halford Chessman  Cardiologist: None (NEW)  HPI: This is a 68 y.o. female with a past medical history significant for with a past medical history significant for type 2 diabetes, COPD on oxygen, hypertension, asthma, and small pericardial effusion which was identified on echo in March 2013. In retrospect a pericardial effusion has been present for some years and it was seen on echocardiogram in 2012. There is no sign of tamponade physiology. She now presents to emergency department with a week of leg swelling, particularly in the left leg and was noted to be tachycardic. She developed some anxiety and tachypnea and was treated with Ativan and then became encephalopathic and developed hypercapnia requiring intubation. Her workup included echocardiogram which showed mild to moderate pulmonary hypertension and a moderate to large free-flowing pericardial effusion, which appears to be increased compared to March 2013. No clear tap and on physiology is noted however it is less reliable in the setting of positive pressure ventilation and pulmonary hypertension. Cardiology is asked to evaluate regarding management of her pericardial effusion.  PMHx:  Past Medical History  Diagnosis Date  . Asthma   . COPD (chronic obstructive pulmonary disease)   . Hypertension   . Diabetes mellitus 09/13/2010    glucophage  . Gout   . Oxygen dependent     pt uses O2   . Pericardial effusion 08/2011    small residual on echo  . Hyperlipidemia   . Type II or unspecified type diabetes mellitus with neurological manifestations, uncontrolled   . Personal history of noncompliance with medical treatment, presenting hazards to health   . Unspecified disease of pericardium   . Hemorrhage of rectum and anus   . Panic disorder without agoraphobia   . Depressive disorder, not elsewhere classified   . Edema   . Anxiety state, unspecified     . Unspecified hereditary and idiopathic peripheral neuropathy   . Type II or unspecified type diabetes mellitus without mention of complication, not stated as uncontrolled   . Other and unspecified hyperlipidemia   . Obesity, unspecified   . Unspecified tinnitus   . Unspecified essential hypertension   . Pneumonia, organism unspecified   . Chronic obstructive asthma, unspecified   . Acute and chronic respiratory failure   . Urinary frequency   . Urgency of urination   . Anemia due to chronic blood loss 01/04/2013   Past Surgical History  Procedure Laterality Date  . Breast biopsy      both breasts  . Total abdominal hysterectomy    . Esophagogastroduodenoscopy N/A 01/03/2013    Procedure: ESOPHAGOGASTRODUODENOSCOPY (EGD);  Surgeon: Inda Castle, MD;  Location: [facility];  Service: Endoscopy;  Laterality: N/A;  . Colonoscopy N/A 01/05/2013    Procedure: COLONOSCOPY;  Surgeon: Gatha Mayer, MD;  Location: [facility];  Service: Endoscopy;  Laterality: N/A;    FAMHx: Family History  Problem Relation Age of Onset  . COPD Mother   . COPD Sister   . Hypertension Sister   . Cancer Father   . Asthma Father   . Diabetes Other     3 maternal aunts  . Prostate cancer Paternal Uncle     SOCHx:  reports that she quit smoking about 9 years ago. Her smoking use included Cigarettes. She has a 30 pack-year smoking history. She has never used smokeless tobacco. She reports that she drinks about 1.0 oz of alcohol per week. She reports  that she does not use illicit drugs.  ALLERGIES: Allergies  Allergen Reactions  . Lisinopril Swelling    Angioedema   . Advair Diskus [Fluticasone-Salmeterol] Other (See Comments)    Nose bleeds.   . Penicillins     hives  . Levaquin [Levofloxacin] Other (See Comments)    ROS: Review of systems not obtained due to patient factors. Patient is intubated.  HOME MEDICATIONS: Prescriptions prior to admission  Medication Sig Dispense Refill Last  Dose  . albuterol (PROAIR HFA) 108 (90 BASE) MCG/ACT inhaler Inhale 2 puffs into the lungs every 4 (four) hours as needed for shortness of breath. 18 g 5 unknown  . albuterol (PROVENTIL) (2.5 MG/3ML) 0.083% nebulizer solution Take 2.5 mg by nebulization every 6 (six) hours as needed for shortness of breath.    05/01/2014 at Unknown time  . budesonide-formoterol (SYMBICORT) 80-4.5 MCG/ACT inhaler Inhale 2 puffs into the lungs 2 (two) times daily. 4 Inhaler 11 04/30/2014 at Unknown time  . hydrALAZINE (APRESOLINE) 25 MG tablet Take 1 tablet (25 mg total) by mouth every 8 (eight) hours. 90 tablet 3 05/01/2014 at Unknown time  . lovastatin (MEVACOR) 40 MG tablet Take 1 tablet (40 mg total) by mouth at bedtime. For hyperlipedemia 30 tablet 3 04/30/2014 at Unknown time  . Multiple Vitamins-Minerals (MULTIVITAMIN ADULTS 50+) TABS Take 1 tablet by mouth daily.   05/01/2014 at Unknown time  . pantoprazole (PROTONIX) 40 MG tablet Take 1 tablet (40 mg total) by mouth daily. 30 tablet 3 05/01/2014 at Unknown time  . promethazine-codeine (PHENERGAN WITH CODEINE) 6.25-10 MG/5ML syrup Take 5 mLs by mouth every 6 (six) hours as needed for cough. 200 mL 0 05/01/2014 at Unknown time  . sertraline (ZOLOFT) 50 MG tablet Take 1 tablet (50 mg total) by mouth at bedtime. 30 tablet 3 04/30/2014 at Unknown time  . tiotropium (SPIRIVA) 18 MCG inhalation capsule Place 1 capsule (18 mcg total) into inhaler and inhale daily. 30 capsule 3 05/01/2014 at Unknown time    HOSPITAL MEDICATIONS: Scheduled: . antiseptic oral rinse  7 mL Mouth Rinse QID  . arformoterol  15 mcg Nebulization BID  . budesonide (PULMICORT) nebulizer solution  0.5 mg Nebulization QID  . chlorhexidine  15 mL Mouth Rinse BID  . feeding supplement (VITAL HIGH PROTEIN)  1,000 mL Per Tube Q24H  . heparin  5,000 Units Subcutaneous 3 times per day  . hydrALAZINE  25 mg Oral 3 times per day  . insulin aspart  0-15 Units Subcutaneous 6 times per day  .  ipratropium  0.5 mg Nebulization Q12H  . [START ON 05/06/2014] levofloxacin (LEVAQUIN) IV  750 mg Intravenous Q48H  . methylPREDNISolone (SOLU-MEDROL) injection  40 mg Intravenous 3 times per day  . pravastatin  40 mg Per Tube q1800  . ranitidine  150 mg Oral BID  . sertraline  50 mg Per Tube QHS  . sodium chloride  3 mL Intravenous Q12H    VITALS: Blood pressure 147/87, pulse 106, temperature 97.3 F (36.3 C), temperature source Core (Comment), resp. rate 20, height _0  (1.575 m), weight 186 lb 15.9 oz (84.82 kg), SpO2 93 %.  PHYSICAL EXAM: General appearance: alert and intubated, follows commands Neck: no carotid bruit and no JVD Lungs: diminished breath sounds bilaterally Heart: regular rate and rhythm and tachycardic Abdomen: obese, mild TTP (grimace, head nods to pain question) Extremities: edema 1+ LLE edema, mild erythema Pulses: 2+ and symmetric Skin: warm, dry Neurologic: Mental status: alert and oriented, follows commands Psych:  Unable to assess  LABS: Results for orders placed or performed during the hospital encounter of 05/01/14 (from the past 48 hour(s))  Glucose, capillary     Status: Abnormal   Collection Time: 05/04/14  2:43 AM  Result Value Ref Range   Glucose-Capillary 168 (H) 70 - 99 mg/dL  Blood gas, arterial     Status: Abnormal   Collection Time: 05/04/14  2:47 AM  Result Value Ref Range   FIO2 1.00 %   Delivery systems OXYGEN MASK    pH, Arterial 7.149 (LL) 7.350 - 7.450    Comment: CRITICAL RESULT CALLED TO, READ BACK BY AND VERIFIED WITH:  HANS JOHNSON, RN AT [place] BY TABATHA KNAPP, RRT, RCP ON 05/04/14    pCO2 arterial ABOVE REPORTABLE RANGE. 35.0 - 45.0 mmHg    Comment: CRITICAL RESULT CALLED TO, READ BACK BY AND VERIFIED WITH: HANS JOHNSON, RN AT [phone] ON 05/04/14 BY TABATHA KNAPP, RRT, RCP.    pO2, Arterial 92.8 80.0 - 100.0 mmHg   O2 Saturation 95.2 %   Patient temperature 98.0    Collection site RIGHT RADIAL    Drawn by [id number]    Sample  type ARTERIAL DRAW    Allens test (pass/fail) PASS PASS  Draw ABG 1 hour after initiation of ventilator     Status: Abnormal   Collection Time: 05/04/14  4:54 AM  Result Value Ref Range   FIO2 0.60 %   Delivery systems VENTILATOR    Mode PRESSURE REGULATED VOLUME CONTROL    VT 470 mL   Rate 16 resp/min   Peep/cpap 5.0 cm H20   pH, Arterial 7.224 (L) 7.350 - 7.450   pCO2 arterial 79.4 (HH) 35.0 - 45.0 mmHg    Comment: CRITICAL RESULT CALLED TO, READ BACK BY AND VERIFIED WITH:  DAVID SIMONDS,MD AT [id number] BY [facility], RRT, RCP ON 05/04/14    pO2, Arterial 79.4 (L) 80.0 - 100.0 mmHg   Bicarbonate 33.0 (H) 20.0 - 24.0 mEq/L   TCO2 32.4 0 - 100 mmol/L   Acid-Base Excess 3.5 (H) 0.0 - 2.0 mmol/L   O2 Saturation 96.0 %   Patient temperature 34.5    Collection site RIGHT RADIAL    Drawn by [phone]    Sample type ARTERIAL    Allens test (pass/fail) PASS PASS  Troponin I     Status: None   Collection Time: 05/04/14  4:59 AM  Result Value Ref Range   Troponin I <0.30 <0.30 ng/mL    Comment:        Due to the release kinetics of cTnI, a negative result within the first hours of the onset of symptoms does not rule out myocardial infarction with certainty. If myocardial infarction is still suspected, repeat the test at appropriate intervals.   CBC     Status: Abnormal   Collection Time: 05/04/14  4:59 AM  Result Value Ref Range   WBC 9.1 4.0 - 10.5 K/uL   RBC 3.16 (L) 3.87 - 5.11 MIL/uL   Hemoglobin 8.9 (L) 12.0 - 15.0 g/dL   HCT 29.5 (L) 36.0 - 46.0 %   MCV 93.4 78.0 - 100.0 fL   MCH 28.2 26.0 - 34.0 pg   MCHC 30.2 30.0 - 36.0 g/dL   RDW 15.9 (H) 11.5 - 15.5 %   Platelets 227 150 - 400 K/uL  Comprehensive metabolic panel     Status: Abnormal   Collection Time: 05/04/14  4:59 AM  Result Value Ref Range   Sodium 142  137 - 147 mEq/L   Potassium 3.7 3.7 - 5.3 mEq/L   Chloride 98 96 - 112 mEq/L   CO2 34 (H) 19 - 32 mEq/L   Glucose, Bld 161 (H) 70 - 99 mg/dL   BUN 19 6 - 23  mg/dL   Creatinine, Ser 1.19 (H) 0.50 - 1.10 mg/dL   Calcium 8.5 8.4 - 10.5 mg/dL   Total Protein 6.5 6.0 - 8.3 g/dL   Albumin 2.9 (L) 3.5 - 5.2 g/dL   AST 21 0 - 37 U/L   ALT 29 0 - 35 U/L   Alkaline Phosphatase 64 39 - 117 U/L   Total Bilirubin <0.2 (L) 0.3 - 1.2 mg/dL   GFR calc non Af Amer 46 (L) >90 mL/min   GFR calc Af Amer 53 (L) >90 mL/min    Comment: (NOTE) The eGFR has been calculated using the CKD EPI equation. This calculation has not been validated in all clinical situations. eGFR's persistently <90 mL/min signify possible Chronic Kidney Disease.    Anion gap 10 5 - 15  Culture, respiratory (NON-Expectorated)     Status: None (Preliminary result)   Collection Time: 05/04/14  5:21 AM  Result Value Ref Range   Specimen Description TRACHEAL ASPIRATE    Special Requests NONE    Gram Stain      RARE WBC PRESENT, PREDOMINANTLY PMN NO SQUAMOUS EPITHELIAL CELLS SEEN RARE GRAM POSITIVE COCCI IN PAIRS Performed at [facility]    Culture      Non-Pathogenic Oropharyngeal-type Flora Isolated. Performed at [facility]    Report Status PENDING   Glucose, capillary     Status: Abnormal   Collection Time: 05/04/14  5:57 AM  Result Value Ref Range   Glucose-Capillary 158 (H) 70 - 99 mg/dL   Comment 1 Documented in Chart    Comment 2 Notify RN   Lactic acid, plasma     Status: None   Collection Time: 05/04/14  6:05 AM  Result Value Ref Range   Lactic Acid, Venous 0.9 0.5 - 2.2 mmol/L  Glucose, capillary     Status: Abnormal   Collection Time: 05/04/14  7:42 AM  Result Value Ref Range   Glucose-Capillary 141 (H) 70 - 99 mg/dL  MRSA PCR Screening     Status: None   Collection Time: 05/04/14  8:20 AM  Result Value Ref Range   MRSA by PCR NEGATIVE NEGATIVE    Comment:        The GeneXpert MRSA Assay (FDA approved for NASAL specimens only), is one component of a comprehensive MRSA colonization surveillance program. It is not intended to diagnose  MRSA infection nor to guide or monitor treatment for MRSA infections.   Glucose, capillary     Status: None   Collection Time: 05/04/14 12:07 PM  Result Value Ref Range   Glucose-Capillary 78 70 - 99 mg/dL  Glucose, capillary     Status: None   Collection Time: 05/04/14  4:29 PM  Result Value Ref Range   Glucose-Capillary 98 70 - 99 mg/dL  Glucose, capillary     Status: Abnormal   Collection Time: 05/04/14  8:24 PM  Result Value Ref Range   Glucose-Capillary 136 (H) 70 - 99 mg/dL  Glucose, capillary     Status: Abnormal   Collection Time: 05/05/14 12:21 AM  Result Value Ref Range   Glucose-Capillary 129 (H) 70 - 99 mg/dL   Comment 1 Notify RN   Glucose, capillary  Status: Abnormal   Collection Time: 05/05/14  4:03 AM  Result Value Ref Range   Glucose-Capillary 176 (H) 70 - 99 mg/dL   Comment 1 Notify RN   Basic metabolic panel     Status: Abnormal   Collection Time: 05/05/14  4:20 AM  Result Value Ref Range   Sodium 141 137 - 147 mEq/L   Potassium 3.7 3.7 - 5.3 mEq/L   Chloride 98 96 - 112 mEq/L   CO2 33 (H) 19 - 32 mEq/L   Glucose, Bld 190 (H) 70 - 99 mg/dL   BUN 34 (H) 6 - 23 mg/dL    Comment: DELTA CHECK NOTED REPEATED TO VERIFY    Creatinine, Ser 1.35 (H) 0.50 - 1.10 mg/dL   Calcium 9.0 8.4 - 10.5 mg/dL   GFR calc non Af Amer 39 (L) >90 mL/min   GFR calc Af Amer 46 (L) >90 mL/min    Comment: (NOTE) The eGFR has been calculated using the CKD EPI equation. This calculation has not been validated in all clinical situations. eGFR's persistently <90 mL/min signify possible Chronic Kidney Disease.    Anion gap 10 5 - 15  Blood gas, arterial     Status: Abnormal   Collection Time: 05/05/14  4:20 AM  Result Value Ref Range   FIO2 0.50 %   Delivery systems VENTILATOR    Mode PRESSURE REGULATED VOLUME CONTROL    VT 470 mL   Rate 16 resp/min   Peep/cpap 5.0 cm H20   pH, Arterial 7.438 7.350 - 7.450   pCO2 arterial 48.5 (H) 35.0 - 45.0 mmHg   pO2, Arterial 64.6  (L) 80.0 - 100.0 mmHg   Bicarbonate 32.6 (H) 20.0 - 24.0 mEq/L   TCO2 30.3 0 - 100 mmol/L   Acid-Base Excess 7.6 (H) 0.0 - 2.0 mmol/L   O2 Saturation 94.0 %   Patient temperature 36.2    Collection site RIGHT RADIAL    Drawn by [phone]    Sample type ARTERIAL    Allens test (pass/fail) PASS PASS  CBC     Status: Abnormal   Collection Time: 05/05/14  4:20 AM  Result Value Ref Range   WBC 9.2 4.0 - 10.5 K/uL   RBC 3.30 (L) 3.87 - 5.11 MIL/uL   Hemoglobin 9.1 (L) 12.0 - 15.0 g/dL   HCT 29.0 (L) 36.0 - 46.0 %   MCV 87.9 78.0 - 100.0 fL   MCH 27.6 26.0 - 34.0 pg   MCHC 31.4 30.0 - 36.0 g/dL   RDW 15.8 (H) 11.5 - 15.5 %   Platelets 252 150 - 400 K/uL  Glucose, capillary     Status: Abnormal   Collection Time: 05/05/14  8:04 AM  Result Value Ref Range   Glucose-Capillary 145 (H) 70 - 99 mg/dL    IMAGING: Portable Chest Xray  05/05/2014   CLINICAL DATA:  Acute respiratory failure.  EXAM: PORTABLE CHEST - 1 VIEW  COMPARISON:  May 04, 2014.  FINDINGS: Stable cardiomegaly. Endotracheal tube is in grossly good position with distal tip 5.5 cm above the carina. Nasogastric tube is seen entering the stomach. Left internal jugular catheter is unchanged in position with distal tip in the expected position the SVC. Stable central pulmonary vascular congestion is noted. No pneumothorax is noted. Minimal bilateral pleural effusions are noted. Stable bilateral perihilar edema is noted which is more prominent on the right.  IMPRESSION: Stable central pulmonary vascular congestion and asymmetric pulmonary edema is noted. Minimal bilateral pleural effusions are  noted. Stable support apparatus.   Electronically Signed   By: Sabino Dick M.D.   On: 05/05/2014 07:10   Dg Chest Port 1 View  05/04/2014   CLINICAL DATA:  Central line placement.  EXAM: PORTABLE CHEST - 1 VIEW  COMPARISON:  05/04/2014  FINDINGS: Examination is technically limited due to patient rotation. Endotracheal tube and enteric tube  appear unchanged in position. New placement of left central venous catheter with tip overlying the mid SVC region. No pneumothorax. Persistent cardiac enlargement with pulmonary vascular congestion and perihilar edema.  IMPRESSION: Appliances appear in satisfactory location. Cardiac enlargement with pulmonary vascular congestion and perihilar edema without significant change.   Electronically Signed   By: Lucienne Capers M.D.   On: 05/04/2014 05:11   Portable Chest Xray  05/04/2014   CLINICAL DATA:  Acute respiratory failure.  EXAM: PORTABLE CHEST - 1 VIEW  COMPARISON:  05/04/2014  FINDINGS: Interval placement of an endotracheal tube with tip measuring 5 cm above the carinal. Enteric tube is been placed. Tip is off the field of view but below the left hemidiaphragm. Persistent cardiac enlargement and pulmonary vascular congestion with increasing edema in the right lung. Small pleural effusion again demonstrated.  IMPRESSION: Appliances appear in satisfactory location. Cardiac enlargement and pulmonary vascular congestion with increasing edema.   Electronically Signed   By: Lucienne Capers M.D.   On: 05/04/2014 04:08   Dg Chest Port 1 View  05/04/2014   CLINICAL DATA:  Worsening shortness of breath.  EXAM: PORTABLE CHEST - 1 VIEW  COMPARISON:  04/19/2014  FINDINGS: Marked cardiac enlargement with mild-to-moderate pulmonary vascular congestion. Bilateral basilar opacities again demonstrated consistent with edema or atelectasis. Small bilateral pleural effusions. Similar appearance to previous study. No pneumothorax. Calcified and tortuous aorta.  IMPRESSION: Cardiac enlargement with pulmonary vascular congestion, small bilateral pleural effusions, and basilar infiltrates similar to previous study.   Electronically Signed   By: Lucienne Capers M.D.   On: 05/04/2014 00:40    HOSPITAL DIAGNOSES: Principal Problem:   Cellulitis of left foot Active Problems:   COPD, frequent exacerbations   Pericardial  effusion   Chronic respiratory failure with hypoxia   Tachycardia   Acute respiratory failure   Left leg swelling   IMPRESSION: 1. Moderate to large chronic pericardial effusion, no clinical signs of tamponade physiology  RECOMMENDATION: 1. Ms. Leitz presented with leg swelling and recently had a COPD exacerbation. She unfortunately suffered type II respiratory failure and required intubation. She is now awake and following commands. Her echocardiogram shows a moderate to large pericardial effusion which is increased in size since 2013. She is hemodynamically stable. It may be worthwhile considering pericardial drainage at some point once she is extubated and improved from a pulmonary standpoint. Given the chronicity of her pericardial effusion, and may be helpful to obtain pericardial tissue diagnosis and consider pericardial window for definitive management. This would best be accomplished by CT surgery. At this point, I do not see an urgent need for needle pericardiocentesis.  Thanks for consulting [place]. We will be available as needed.  Time Spent Directly with Patient: 45 minutes  Pixie Casino, MD, [facility] Attending Cardiologist [facility]  Tomiko Schoon C 05/05/2014, 11:12 AM

## 2014-05-05 NOTE — Consult Note (Signed)
PULMONARY / CRITICAL CARE MEDICINE   Name: [name] MRN: [id number] DOB: [birth year]    ADMISSION DATE:  05/01/2014 CONSULTATION DATE:  05/05/2014  REFERRING MD :  TRH  CHIEF COMPLAINT:  Respiratory Failure  INITIAL PRESENTATION:  68 y/o female presented with LLE cellulitis.  Developed anxiety/tachypnea in hospital tx with ativan >> developed encephalopathy 2nd to hypercapnia requiring intubation.  PCCM assumed care in ICU.  She had recent admit for AECOPD (FEV1 0.85, FEV1% 47 from 2012).  She is followed by Dr. 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 Pt became agitated and hypertensive last night with bathing, was given hydralazine, metoprolol. Pt also experienced CP with diaphoresis while hypertensive, ECG showed sinus tach w/o ST elevation or T wave changes, pt was treated with fentanyl and versed, no longer complaining of CP.  VITAL SIGNS: Temp:  [97.2 F (36.2 C)-100.6 F (38.1 C)] 97.2 F (36.2 C) (11/19 1000) Pulse Rate:  [87-125] 88 (11/19 1000) Resp:  [16-25] 21 (11/19 1000) BP: (86-205)/(51-110) 165/91 mmHg (11/19 1000) SpO2:  [89 %-99 %] 96 % (11/19 1000) FiO2 (%):  [40 %-60 %] 50 % (11/19 1000)   HEMODYNAMICS: CVP:  [12 mmHg-14 mmHg] 14 mmHg   VENTILATOR SETTINGS: Vent Mode:  [-] PRVC FiO2 (%):  [40 %-60 %] 50 % Set Rate:  [16 bmp] 16 bmp Vt Set:  [470 mL] 470  mL PEEP:  [5 cmH20] 5 cmH20 Plateau Pressure:  [17 cmH20-23 cmH20] 23 cmH20   INTAKE / OUTPUT: Intake/Output      11/18 0701 - 11/19 0700 11/19 0701 - 11/20 0700   I.V. (mL/kg) 330 (3.9) 60 (0.7)   NG/GT 548 150   IV Piggyback 250    Total Intake(mL/kg) 1128 (13.3) 210 (2.5)   Urine (mL/kg/hr) 718 (0.4) 210 (0.7)   Total Output 718 210   Net +410 0          PHYSICAL EXAMINATION: General: WDWN female, minimally responsive. Neuro: Sedated, awakens to physical stimuli. HEENT: [place]/AT. PERRL, sclerae anicteric. Cardiovascular: Tachy bur regular, no M/R/G.  Lungs: even/non-labored, scattered rhonchi Abdomen: BS x 4, soft, NT/ND.  Musculoskeletal: No gross deformities, LLE edema > RLE. Skin: Intact, LLE warm, no rashes.  LABS:  CBC  Recent Labs Lab 05/02/14 0454 05/04/14 0459 05/05/14 0420  WBC 10.5 9.1 9.2  HGB 10.1* 8.9* 9.1*  HCT 32.7* 29.5* 29.0*  PLT 262 227 252   Coag's No results for input(s): APTT, INR in the last 168 hours.   BMET  Recent Labs Lab 05/01/14 2051 05/04/14 0459 05/05/14 0420  NA 140 142 141  K 3.8 3.7 3.7  CL 96 98 98  CO2 34* 34* 33*  BUN 19 19 34*  CREATININE 1.00 1.19* 1.35*  GLUCOSE 126* 161* 190*   Electrolytes  Recent Labs Lab 05/01/14 2051 05/04/14 0459 05/05/14 0420  CALCIUM 9.0 8.5 9.0   Sepsis Markers  Recent Labs Lab 05/01/14 2101  05/04/14 0605  LATICACIDVEN <0.30* 0.9   ABG  Recent Labs Lab 05/04/14 0247 05/04/14 0454 05/05/14 0420  PHART 7.149* 7.224* 7.438  PCO2ART ABOVE REPORTABLE RANGE. 79.4* 48.5*  PO2ART 92.8 79.4* 64.6*   Liver Enzymes  Recent Labs Lab 05/01/14 2051 05/04/14 0459  AST 16 21  ALT 17 29  ALKPHOS 70 64  BILITOT <0.2* <0.2*  ALBUMIN 3.3* 2.9*   Cardiac Enzymes  Recent Labs Lab 05/04/14 0459  TROPONINI <0.30     Glucose  Recent Labs Lab 05/04/14 1207 05/04/14 1629 05/04/14 2024 05/05/14 0021 05/05/14 0403 05/05/14 0804  GLUCAP 78 98 136* 129* 176* 145*     Imaging Portable Chest Xray  05/05/2014   CLINICAL DATA:  Acute respiratory failure.  EXAM: PORTABLE CHEST - 1 VIEW  COMPARISON:  May 04, 2014.  FINDINGS: Stable cardiomegaly. Endotracheal tube is in grossly good position with distal tip 5.5 cm above the carina. Nasogastric tube is seen entering the stomach. Left internal jugular catheter is unchanged in position with distal tip in the expected position the SVC. Stable central pulmonary vascular congestion is noted. No pneumothorax is noted. Minimal bilateral pleural effusions are noted. Stable bilateral perihilar edema is noted which is more prominent on the right.  IMPRESSION: Stable central pulmonary vascular congestion and asymmetric pulmonary edema is noted. Minimal bilateral pleural effusions are noted. Stable support apparatus.   Electronically Signed   By: Sabino Dick M.D.   On: 05/05/2014 07:10   Dg Chest Port 1 View  05/04/2014   CLINICAL DATA:  Central line placement.  EXAM: PORTABLE CHEST - 1 VIEW  COMPARISON:  05/04/2014  FINDINGS: Examination is technically limited due to patient rotation. Endotracheal tube and enteric tube appear unchanged in position. New placement of left central venous catheter with tip overlying the mid SVC region. No pneumothorax. Persistent cardiac enlargement with pulmonary vascular congestion and perihilar edema.  IMPRESSION: Appliances appear in satisfactory location. Cardiac enlargement with pulmonary vascular congestion and perihilar edema without significant change.   Electronically Signed   By: Lucienne Capers M.D.   On: 05/04/2014 05:11   Portable Chest Xray  05/04/2014   CLINICAL DATA:  Acute respiratory failure.  EXAM: PORTABLE CHEST - 1 VIEW  COMPARISON:  05/04/2014  FINDINGS: Interval placement of an endotracheal tube with tip measuring 5 cm above the carinal. Enteric tube is been placed. Tip is off the field of view but below the left hemidiaphragm. Persistent cardiac enlargement and  pulmonary vascular congestion with increasing edema in the right lung. Small pleural effusion again demonstrated.  IMPRESSION: Appliances appear in satisfactory location. Cardiac enlargement and pulmonary vascular congestion with increasing edema.   Electronically Signed   By: Lucienne Capers M.D.   On: 05/04/2014 04:08   Dg Chest Port 1 View  05/04/2014   CLINICAL DATA:  Worsening shortness of breath.  EXAM: PORTABLE CHEST - 1 VIEW  COMPARISON:  04/19/2014  FINDINGS: Marked cardiac enlargement with mild-to-moderate pulmonary vascular congestion. Bilateral basilar opacities again demonstrated consistent with edema or atelectasis. Small bilateral pleural effusions. Similar appearance to previous study. No pneumothorax. Calcified and tortuous aorta.  IMPRESSION: Cardiac enlargement with pulmonary vascular congestion, small bilateral pleural effusions, and basilar infiltrates similar to previous study.   Electronically Signed   By: Lucienne Capers M.D.   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 Pulmonary HTN - noted on ECHO  PA Peak 44 P:   Full vent support Daily SBT / WUA Monitor for PEEPi Scheduled BDs with brovana, pulmicort, atrovent, and prn albuterol Solumedrol 40 mg q8h Hold outpatient symbicort, spiriva. Trend CXR  CARDIOVASCULAR CVL L IJ 11/18 >>> A:  Hx of HTN, HLD. Enlarged cardiac shadow on CXR with hx of pericardial effusion. Hypotension after intubation 11/18. P:  Monitor CVP, 14 11/19 Consider lasix Resume home hydralazine 25 mg TID PRN hydralazine & lopressor for BP / HR control Continue pravastatin  RENAL A:   AKI >> baseline creatinine 1 from 05/01/14. P:   Monitor renal fx, urine outpt, electrolytes  GASTROINTESTINAL A:   Nutrition. P:   Zantac for SUP Continue TF Colace PRN  HEMATOLOGIC A:   Anemia of chronic disease and critical  illness. P:  F/u CBC SQ heparin for DVT prevention  INFECTIOUS A:   LLE Cellulitis. P:   Day 5 of Abx, currently on levaquin  Sputum 11/18 >>  ENDOCRINE A:   DM type II with steroid induced hyperglycemia. Hx gout P:  SSI  NEUROLOGIC A:   Acute metabolic encephalopathy 2nd to respiratory failure. Hx Depression, anxiety, panic attacks. P:   Sedation:  Fentanyl PRN. RASS goal: 0 to -1. Daily WUA. Continue zoloft   Family updated: Elberta Spaniel over phone by RN prior to intubation 11/18  Interdisciplinary Family Meeting v Palliative Care Meeting:  Due by: 11/24.   Noe Gens, NP-C [facility] Pgr: [phone] or [phone]   05/05/2014, 10:39 AM    Reviewed above, examined pt.  Her Echo showed increased pericardial effusion.  Appreciate input from cardiology >> she will need evaluation by cardiothoracic surgery at some point.  Will continue to do pressure support weaning >> she is not ready for extubation yet, but hopefully soon.  Her mental status is much improved.  Continue Abx for cellulitis of her Lt leg >> improving.  CC time by me independent of APP time is 35 minutes.  [name], MD [facility] Pulmonary/Critical Care 05/05/2014, 12:41 PM Pager:  [phone] After 3pm call: [phone]

## 2014-05-05 NOTE — Progress Notes (Signed)
Pt's B/P 205/102, complaining of chest pressure, diaphoretic, EKG done and [facility] called. Advised to give additional dose of Fentanyl 50 mg and Versed 2mg .

## 2014-05-05 NOTE — Progress Notes (Signed)
PULMONARY / CRITICAL CARE MEDICINE   Name: [name] MRN: [id number] DOB: May 02, 1946    ADMISSION DATE:  05/01/2014 CONSULTATION DATE:  05/05/2014  REFERRING MD :  TRH  CHIEF COMPLAINT:  Respiratory Failure  INITIAL PRESENTATION:   68 yo female presented with LLE cellulitis.  Developed anxiety/tachypnea in hospital tx with ativan >> developed encephalopathy 2nd to hypercapnia requiring intubation.  PCCM assumed care in ICU.  She had recent admit for AECOPD (FEV1 0.85, FEV1% 47 from 2012).  She is followed by Dr. 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  Pt became agitated and hypertensive last night with bathing, was given hydralazine, metoprolol.  Pt also experienced CP with diaphoresis while hypertensive, ECG showed sinus tach w/o ST elevation or T wave changes, pt was treated with fentanyl and versed, no longer complaining of CP.  VITAL SIGNS: Temp:  [97.2 F (36.2 C)-100.6 F (38.1 C)] 97.2 F (36.2 C) (11/19 0400) Pulse Rate:  [83-125] 102 (11/19 0828) Resp:  [14-25] 23 (11/19 0828) BP: (86-205)/(51-110) 156/92 mmHg (11/19 0828) SpO2:  [89 %-97 %] 93 % (11/19 0828) FiO2 (%):  [40 %-60 %] 40 % (11/19 0828) HEMODYNAMICS: CVP:  [12 mmHg-14 mmHg] 14 mmHg VENTILATOR SETTINGS: Vent Mode:   [-] PRVC FiO2 (%):  [40 %-60 %] 40 % Set Rate:  [16 bmp] 16 bmp Vt Set:  [470 mL] 470 mL PEEP:  [5 cmH20] 5 cmH20 Plateau Pressure:  [17 cmH20-23 cmH20] 23 cmH20 INTAKE / OUTPUT: Intake/Output      11/18 0701 - 11/19 0700 11/19 0701 - 11/20 0700   I.V. (mL/kg) 330 (3.9) 20 (0.2)   NG/GT 548 40   IV Piggyback 250    Total Intake(mL/kg) 1128 (13.3) 60 (0.7)   Urine (mL/kg/hr) 718 (0.4) 100 (0.7)   Total Output 718 100   Net +410 -40          PHYSICAL EXAMINATION: General: Obese female, in NAD, vented/sedated Neuro: RASS -1, answering y/n questions, moving all extremities HEENT: [place]/AT. PERRL, sclerae anicteric. Cardiovascular: Tachy, bounding, regular, no M/R/G.  Lungs: Good BS bilaterally with rhonchi bilaterally at bases, likely referred bronchial congestion, no wheeze or crackles Abdomen: BS x 4, soft, NT/ND.  Musculoskeletal: No gross deformities, LLE edema > RLE, no pitting edema, no erythema Skin: Intact, LLE warm, no rashes.  LABS:  CBC  Recent Labs Lab 05/02/14 0454 05/04/14 0459 05/05/14 0420  WBC 10.5 9.1 9.2  HGB 10.1* 8.9* 9.1*  HCT 32.7* 29.5* 29.0*  PLT 262 227 252   Coag's No results for input(s): APTT, INR in the last 168 hours.   BMET  Recent Labs Lab 05/01/14 2051 05/04/14 0459 05/05/14 0420  NA 140 142 141  K 3.8 3.7 3.7  CL 96 98 98  CO2 34* 34* 33*  BUN 19 19 34*  CREATININE 1.00 1.19* 1.35*  GLUCOSE 126* 161* 190*   Electrolytes  Recent Labs Lab 05/01/14 2051 05/04/14 0459 05/05/14 0420  CALCIUM 9.0 8.5 9.0   Sepsis Markers  Recent Labs Lab 05/01/14 2101 05/04/14 0605  LATICACIDVEN <0.30* 0.9   ABG  Recent Labs Lab 05/04/14 0247 05/04/14 0454 05/05/14 0420  PHART 7.149* 7.224* 7.438  PCO2ART ABOVE REPORTABLE RANGE. 79.4* 48.5*  PO2ART 92.8 79.4* 64.6*   Liver Enzymes  Recent Labs Lab 05/01/14 2051 05/04/14 0459  AST 16 21  ALT 17 29  ALKPHOS 70 64  BILITOT <0.2* <0.2*  ALBUMIN 3.3* 2.9*   Cardiac  Enzymes  Recent Labs Lab 05/04/14 0459  TROPONINI <0.30     Glucose  Recent Labs Lab 05/04/14 1207 05/04/14 1629 05/04/14 2024 05/05/14 0021 05/05/14 0403 05/05/14 0804  GLUCAP 78 98 136* 129* 176* 145*    Imaging Portable Chest Xray  05/05/2014   CLINICAL DATA:  Acute respiratory failure.  EXAM: PORTABLE CHEST - 1 VIEW  COMPARISON:  May 04, 2014.  FINDINGS: Stable cardiomegaly. Endotracheal tube is in grossly good position with distal tip 5.5 cm above the carina. Nasogastric tube is seen entering the stomach. Left internal jugular catheter is unchanged in position with distal tip in the expected position the SVC. Stable central pulmonary vascular congestion is noted. No pneumothorax is noted. Minimal bilateral pleural effusions are noted. Stable bilateral perihilar edema is noted which is more prominent on the right.  IMPRESSION: Stable central pulmonary vascular congestion and asymmetric pulmonary edema is noted. Minimal bilateral pleural effusions are noted. Stable support apparatus.   Electronically Signed   By: Sabino Dick M.D.   On: 05/05/2014 07:10   Dg Chest Port 1 View  05/04/2014   CLINICAL DATA:  Central line placement.  EXAM: PORTABLE CHEST - 1 VIEW  COMPARISON:  05/04/2014  FINDINGS: Examination is technically limited due to patient rotation. Endotracheal tube and enteric tube appear unchanged in position. New placement of left central venous catheter with tip overlying the mid SVC region. No pneumothorax. Persistent cardiac enlargement with pulmonary vascular congestion and perihilar edema.  IMPRESSION: Appliances appear in satisfactory location. Cardiac enlargement with pulmonary vascular congestion and perihilar edema without significant change.   Electronically Signed   By: Lucienne Capers M.D.   On: 05/04/2014 05:11   Portable Chest Xray  05/04/2014   CLINICAL DATA:  Acute respiratory failure.  EXAM: PORTABLE CHEST - 1 VIEW  COMPARISON:  05/04/2014  FINDINGS:  Interval placement of an endotracheal tube with tip measuring 5 cm above the carinal. Enteric tube is been placed. Tip is off the field of view but below the left hemidiaphragm. Persistent cardiac enlargement and pulmonary vascular congestion with increasing edema in the right lung. Small pleural effusion again demonstrated.  IMPRESSION: Appliances appear in satisfactory location. Cardiac enlargement and pulmonary vascular congestion with increasing edema.   Electronically Signed   By: Lucienne Capers M.D.   On: 05/04/2014 04:08   Dg Chest Port 1 View  05/04/2014   CLINICAL DATA:  Worsening shortness of breath.  EXAM: PORTABLE CHEST - 1 VIEW  COMPARISON:  04/19/2014  FINDINGS: Marked cardiac enlargement with mild-to-moderate pulmonary vascular congestion. Bilateral basilar opacities again demonstrated consistent with edema or atelectasis. Small bilateral pleural effusions. Similar appearance to previous study. No pneumothorax. Calcified and tortuous aorta.  IMPRESSION: Cardiac enlargement with pulmonary vascular congestion, small bilateral pleural effusions, and basilar infiltrates similar to previous study.   Electronically Signed   By: Gwyndolyn Saxon  Gerilyn Nestle M.D.   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 Pulmonary HTN P:   Full vent support - not tolerating wean Monitor for PEEPi Scheduled BDs with brovana, pulmicort, atrovent, and prn albuterol Solumedrol 40 mg q8h Serial CXR, abg's with vent setting changes Consider gentle lasix or increased BP therapy  CARDIOVASCULAR CVL L IJ 11/18 >>> A:  Pericardial effusion (moderate to large) w/o definitive evidence of tamponade physiology CHF - grade 1 LV diastolic dysfunction Hypotension after intubation 11/18 - resolved Hx of HTN, HLD  Tachycardia P:  Monitor CVP Continue pravastatin Resume home med hydralazine PRN for SBP > 180  (with hold parameters for SBP < 90) Metoprolol PRN for HR > 110 Cardiology consult for pericardial effusion (?) With CP episodes, HTN and pericardial effusion, consider troponin to r/o demand ischemia  RENAL A:   AKI >> baseline creatinine 1 from 05/01/14, 1.35 11/19 P:   Monitor renal fx, urine outpt, electrolytes  GASTROINTESTINAL A:   Nutrition. P:   Zantac for GI prophylaxis Tube feeds while on vent Colace PRN for constipation  HEMATOLOGIC A:   Anemia of chronic disease and critical illness. P:  F/u CBC SG heparin for DVT prevention  INFECTIOUS A:   LLE Cellulitis. P:   Day 4 of Abx, currently on levaquin  Sputum 11/18 >>  ENDOCRINE A:   DM type II with steroid induced hyperglycemia. Hx gout P:  SSI  NEUROLOGIC A:   Acute metabolic encephalopathy 2nd to respiratory failure. Hx Depression, anxiety, panic attacks. P:   Sedation:  Fentanyl PRN RASS goal: 0 to -1. Daily WUA. Continue zoloft, versed PRN for agitation    Today's Summary:   68 yo female with hx of GOLD 4 COPD admitted with Lt leg cellulitis.  Has hx of anxiety/panic attacks.  Was given ativan in PM of 11/17.  Developed altered mental status with hypercapnia/hypoxia, requiring intubation.  She was noted to have enlarged cardiac shadow on CXR, and there was concern for AECOPD.  She had transient hypotension after intubation >> improved since and is now hypertensive which increases SBP >200 with agitation.  Her mental status continues to be improved with ventilatory support.    LLE cellulitis continuing to improve with Abx.  Pt still failing vent weaning with hypoxia, accesory muscle use, diaphoresis, increased agitation/anxiety, and complaining of left sided chest pressure.  .  Cardiology consult for pericardial effusion and HTN, continued BD and tighter control of hypertension and anxiety, hope she will progress with SBT.  Delsa Grana PA-S2 Student note for educational purposes only - please see  PCCM rounding note.

## 2014-05-05 NOTE — Plan of Care (Signed)
Problem: Phase II Progression Outcomes Goal: Tolerating diet Outcome: Progressing     

## 2014-05-06 ENCOUNTER — Inpatient Hospital Stay (HOSPITAL_COMMUNITY): Payer: Medicare Other

## 2014-05-06 LAB — CBC
HCT: 29.7 % — ABNORMAL LOW (ref 36.0–46.0)
HEMOGLOBIN: 9.5 g/dL — AB (ref 12.0–15.0)
MCH: 27.5 pg (ref 26.0–34.0)
MCHC: 32 g/dL (ref 30.0–36.0)
MCV: 85.8 fL (ref 78.0–100.0)
Platelets: 254 10*3/uL (ref 150–400)
RBC: 3.46 MIL/uL — AB (ref 3.87–5.11)
RDW: 15.9 % — ABNORMAL HIGH (ref 11.5–15.5)
WBC: 8.5 10*3/uL (ref 4.0–10.5)

## 2014-05-06 LAB — BASIC METABOLIC PANEL
ANION GAP: 11 (ref 5–15)
BUN: 41 mg/dL — ABNORMAL HIGH (ref 6–23)
CHLORIDE: 99 meq/L (ref 96–112)
CO2: 33 meq/L — AB (ref 19–32)
Calcium: 9.3 mg/dL (ref 8.4–10.5)
Creatinine, Ser: 1.14 mg/dL — ABNORMAL HIGH (ref 0.50–1.10)
GFR calc Af Amer: 56 mL/min — ABNORMAL LOW (ref >90)
GFR calc non Af Amer: 48 mL/min — ABNORMAL LOW (ref >90)
Glucose, Bld: 163 mg/dL — ABNORMAL HIGH (ref 70–99)
POTASSIUM: 3.5 meq/L — AB (ref 3.7–5.3)
SODIUM: 143 meq/L (ref 137–147)

## 2014-05-06 LAB — CULTURE, RESPIRATORY

## 2014-05-06 LAB — GLUCOSE, CAPILLARY
GLUCOSE-CAPILLARY: 120 mg/dL — AB (ref 70–99)
GLUCOSE-CAPILLARY: 142 mg/dL — AB (ref 70–99)
GLUCOSE-CAPILLARY: 171 mg/dL — AB (ref 70–99)
Glucose-Capillary: 142 mg/dL — ABNORMAL HIGH (ref 70–99)
Glucose-Capillary: 139 mg/dL — ABNORMAL HIGH (ref 70–99)
Glucose-Capillary: 148 mg/dL — ABNORMAL HIGH (ref 70–99)

## 2014-05-06 LAB — CULTURE, RESPIRATORY W GRAM STAIN

## 2014-05-06 MED ORDER — SERTRALINE HCL 50 MG PO TABS
50.0000 mg | ORAL_TABLET | Freq: Every day | ORAL | Status: DC
  Administered 2014-05-06 – 2014-05-09 (×4): 50 mg via ORAL
  Filled 2014-05-06 (×4): qty 1

## 2014-05-06 MED ORDER — ARFORMOTEROL TARTRATE 15 MCG/2ML IN NEBU
15.0000 ug | INHALATION_SOLUTION | Freq: Two times a day (BID) | RESPIRATORY_TRACT | Status: DC
  Administered 2014-05-06 – 2014-05-07 (×2): 15 ug via RESPIRATORY_TRACT
  Filled 2014-05-06 (×2): qty 2

## 2014-05-06 MED ORDER — BUDESONIDE 0.5 MG/2ML IN SUSP
0.5000 mg | Freq: Two times a day (BID) | RESPIRATORY_TRACT | Status: DC
  Administered 2014-05-06 – 2014-05-07 (×2): 0.5 mg via RESPIRATORY_TRACT
  Filled 2014-05-06 (×2): qty 2

## 2014-05-06 MED ORDER — LIP MEDEX EX OINT
TOPICAL_OINTMENT | CUTANEOUS | Status: DC | PRN
  Filled 2014-05-06 (×2): qty 7

## 2014-05-06 MED ORDER — SODIUM CHLORIDE 0.9 % IV SOLN
INTRAVENOUS | Status: DC
  Administered 2014-05-07: 1000 mL via INTRAVENOUS
  Administered 2014-05-07: 10:00:00 via INTRAVENOUS

## 2014-05-06 MED ORDER — LOSARTAN POTASSIUM 25 MG PO TABS
25.0000 mg | ORAL_TABLET | Freq: Every day | ORAL | Status: DC
  Administered 2014-05-06 – 2014-05-09 (×4): 25 mg via ORAL
  Filled 2014-05-06 (×4): qty 1

## 2014-05-06 MED ORDER — MORPHINE SULFATE 2 MG/ML IJ SOLN
1.0000 mg | INTRAMUSCULAR | Status: DC | PRN
  Administered 2014-05-06: 1 mg via INTRAVENOUS
  Administered 2014-05-06 – 2014-05-07 (×5): 2 mg via INTRAVENOUS
  Filled 2014-05-06 (×6): qty 1

## 2014-05-06 MED ORDER — METHYLPREDNISOLONE SODIUM SUCC 40 MG IJ SOLR
20.0000 mg | Freq: Two times a day (BID) | INTRAMUSCULAR | Status: DC
  Administered 2014-05-06 – 2014-05-07 (×2): 20 mg via INTRAVENOUS
  Filled 2014-05-06 (×2): qty 1

## 2014-05-06 MED ORDER — MENTHOL 3 MG MT LOZG: 1.0000 | LOZENGE | OROMUCOSAL | Status: DC | PRN

## 2014-05-06 MED ORDER — PRAVASTATIN SODIUM 40 MG PO TABS
40.0000 mg | ORAL_TABLET | Freq: Every day | ORAL | Status: DC
  Administered 2014-05-06 – 2014-05-09 (×4): 40 mg via ORAL
  Filled 2014-05-06 (×5): qty 1

## 2014-05-06 NOTE — Progress Notes (Signed)
PULMONARY / CRITICAL CARE MEDICINE   Name: [name] MRN: [id number] DOB: 1946-06-02    ADMISSION DATE:  05/01/2014 CONSULTATION DATE:  05/06/2014  REFERRING MD :  TRH  CHIEF COMPLAINT:  Respiratory Failure  INITIAL PRESENTATION:  68 y/o female presented with LLE cellulitis.  Developed anxiety/tachypnea in hospital tx with ativan >> developed encephalopathy 2nd to hypercapnia requiring intubation.  PCCM assumed care in ICU.  She had recent admit for AECOPD (FEV1 0.85, FEV1% 47 from 2012).  She is followed by Dr. 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  STUDIES:  L foot XR 11/15 >>> soft tissue swelling, no osseous abnormality LE Duplex 11/17 >>> neg for DVT Echo 11/18 >>>EF 55-60%, mild LV hypertrophy with grade 1 diastolic dysfunction, LA mildly dilated, RA mildly dilated, PA peak pressure 44 mm Hg, moderate to large, free-flowing pericardial effusion (increased from march 2013) w/o evidence of tamponade physiology (low specificity due to pulm HTN and PPIV)  SIGNIFICANT EVENTS: 11/15 admitted with cellulitis of LLE 11/18 transferred to ICU, intubated   SUBJECTIVE:  Pt able to wean on vent when she is not anxious.  Hypertensive this am with chest pressure, improved HR and BP with prn lopressor.    VITAL SIGNS: Temp:  [96.8 F (36 C)-98.1 F (36.7 C)] 96.8 F (36 C) (11/20 0800) Pulse Rate:  [63-110] 91 (11/20 0800) Resp:  [16-27] 22 (11/20 0800) BP: (136-194)/(80-150) 167/95 mmHg (11/20 0800) SpO2:  [92 %-97 %] 97 % (11/20 0800) FiO2 (%):  [40 %-50 %] 40 % (11/20 0838)   HEMODYNAMICS: CVP:  [11 mmHg-20 mmHg] 12 mmHg   VENTILATOR SETTINGS: Vent Mode:  [-] PRVC FiO2 (%):  [40 %-50 %] 40 % Set Rate:  [16 bmp] 16 bmp Vt Set:  [470 mL] 470 mL PEEP:  [5 cmH20] 5 cmH20 Pressure Support:  [10 cmH20] 10 cmH20 Plateau Pressure:  [17 cmH20-22 cmH20] 22 cmH20   INTAKE / OUTPUT: Intake/Output      11/19 0701 - 11/20 0700 11/20 0701 - 11/21 0700    I.V. (mL/kg) 420 (5) 40 (0.5)   NG/GT 1090 90   IV Piggyback     Total Intake(mL/kg) 1510 (17.8) 130 (1.5)   Urine (mL/kg/hr) 605 (0.3)    Emesis/NG output  15 (0.1)   Total Output 605 15   Net +905 +115        Stool Occurrence  1 x     PHYSICAL EXAMINATION: General: WDWN female, answering y/n questions, in NAD, on vent Neuro: RASS 0 HEENT: [place]/AT. PERRL, sclerae anicteric. Cardiovascular: Tachy bur regular, no M/R/G.  Lungs: even/non-labored  Abdomen: BS x 4, soft, NT/ND.  Musculoskeletal: No gross deformities Skin: Intact, LLE warm, no rashes, no edema or erythema  LABS:  CBC  Recent Labs Lab 05/04/14 0459 05/05/14 0420 05/06/14 0620  WBC 9.1 9.2 8.5  HGB 8.9* 9.1* 9.5*  HCT 29.5* 29.0* 29.7*  PLT 227 252 254   Coag's No results for input(s): APTT, INR in the last 168 hours.   BMET  Recent Labs Lab 05/04/14 0459 05/05/14 0420 05/06/14 0620  NA 142 141 143  K 3.7 3.7 3.5*  CL 98 98 99  CO2 34* 33* 33*  BUN 19 34* 41*  CREATININE 1.19* 1.35* 1.14*  GLUCOSE 161* 190* 163*   Electrolytes  Recent Labs Lab 05/04/14 0459 05/05/14 0420 05/06/14 0620  CALCIUM 8.5 9.0 9.3   Sepsis Markers  Recent Labs Lab 05/01/14 2101 05/04/14 9794  LATICACIDVEN <0.30* 0.9   ABG  Recent Labs Lab 05/04/14 0247 05/04/14 0454 05/05/14 0420  PHART 7.149* 7.224* 7.438  PCO2ART ABOVE REPORTABLE RANGE. 79.4* 48.5*  PO2ART 92.8 79.4* 64.6*   Liver Enzymes  Recent Labs Lab 05/01/14 2051 05/04/14 0459  AST 16 21  ALT 17 29  ALKPHOS 70 64  BILITOT <0.2* <0.2*  ALBUMIN 3.3* 2.9*   Cardiac Enzymes  Recent Labs Lab 05/04/14 0459  TROPONINI <0.30     Glucose  Recent Labs Lab 05/05/14 1204 05/05/14 1557 05/05/14 1948 05/06/14 0002 05/06/14 0433 05/06/14 0825  GLUCAP 127* 144* 165* 142* 171* 142*    Imaging Dg Chest Port 1 View  05/06/2014   CLINICAL DATA:  Respiratory distress, acute respiratory failure, intubated patient  EXAM: PORTABLE  CHEST - 1 VIEW  COMPARISON:  Portable chest x-ray of May 05, 2014  FINDINGS: The lungs are adequately inflated. The left hemidiaphragm remains obscured. The pulmonary interstitial markings remain increased bilaterally. Fluid in the minor fissure. There is no pneumothorax. The cardiopericardial silhouette is enlarged. The pulmonary vascularity is engorged and indistinct.  The endotracheal tube tip lies approximately 6 cm above the crotch of the carina. The esophagogastric tube tip projects below the inferior margin of the image. The left internal jugular venous catheter tip projects over the midportion of the SVC.  IMPRESSION: There has been little change in the appearance of the chest since yesterday's study. The findings are consistent with congestive heart failure and pulmonary interstitial and alveolar edema. There are bilateral pleural effusions. The support tubes and lines are in appropriate position.   Electronically Signed   By: David  Martinique   On: 05/06/2014 07:49   Portable Chest Xray  05/05/2014   CLINICAL DATA:  Acute respiratory failure.  EXAM: PORTABLE CHEST - 1 VIEW  COMPARISON:  May 04, 2014.  FINDINGS: Stable cardiomegaly. Endotracheal tube is in grossly good position with distal tip 5.5 cm above the carina. Nasogastric tube is seen entering the stomach. Left internal jugular catheter is unchanged in position with distal tip in the expected position the SVC. Stable central pulmonary vascular congestion is noted. No pneumothorax is noted. Minimal bilateral pleural effusions are noted. Stable bilateral perihilar edema is noted which is more prominent on the right.  IMPRESSION: Stable central pulmonary vascular congestion and asymmetric pulmonary edema is noted. Minimal bilateral pleural effusions are noted. Stable support apparatus.   Electronically Signed   By: Sabino Dick M.D.   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 Pulmonary HTN - noted on ECHO PA Peak 44 P:   Daily SBT / WUA - doing well with weans  - extubate today Scheduled BDs with brovana, pulmicort, atrovent, and prn albuterol Solumedrol taper Hold outpatient symbicort, spiriva.  CARDIOVASCULAR CVL L IJ 11/18 >>> A:  Hx of HTN, HLD. Chronic pericardial effusion Hypotension after intubation 11/18. Hypertensive Chest pressure - episodes occur with increased anxiety and increased BP P:  Monitor CVP, 12 - 11/20 Resume home hydralazine 25 mg TID PRN hydralazine & lopressor for BP / HR control Continue pravastatin 1x troponin for chest pressure  Resume Cozaar at 25 mg Q day Cardiology consult recommends observation of pericardial effusion with future CT consult for pericardial window/ pericardiocentesis   RENAL A:   AKI >> baseline creatinine 1 from 05/01/14, improving Hypokalemia P:   Monitor UO BMET in am  GASTROINTESTINAL A:   Nutrition. P:   Zantac for SUP Colace PRN  HEMATOLOGIC A:   Anemia of chronic disease and critical illness - stable P:  SQ heparin for DVT prevention, will d/c when pt is OOB  INFECTIOUS A:   LLE Cellulitis - resolved P:   Day 6 of Abx, currently on levaquin D/C levaquin after 11/20 Sputum 11/18 >> neg  ENDOCRINE A:   DM type II with steroid induced hyperglycemia. Hx gout P:  SSI  NEUROLOGIC A:   Acute metabolic encephalopathy 2nd to respiratory failure - resolved Hx Depression, anxiety, panic attacks. P:   PRN fentanyl  Continue zoloft   Delsa Grana PA-S2  Student note for educational purposes only - please see PCCM rounding note.

## 2014-05-06 NOTE — Progress Notes (Addendum)
PULMONARY / CRITICAL CARE MEDICINE   Name: [name] MRN: [id number] DOB: 1945/07/20    ADMISSION DATE:  05/01/2014 CONSULTATION DATE:  05/06/2014  REFERRING MD :  TRH  CHIEF COMPLAINT:  Respiratory Failure  INITIAL PRESENTATION:  68 y/o female presented with LLE cellulitis.  Developed anxiety/tachypnea in hospital tx with ativan >> developed encephalopathy 2nd to hypercapnia requiring intubation.  PCCM assumed care in ICU.  She had recent admit for AECOPD (FEV1 0.85, FEV1% 47 from 2012).  She is followed by Dr. 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  Hypertensive overnight.    VITAL SIGNS: Temp:  [96.8 F (36 C)-98.1 F (36.7 C)] 96.8 F (36 C) (11/20 0800) Pulse Rate:  [63-110] 91 (11/20 0800) Resp:  [16-27] 22 (11/20 0800) BP: (136-194)/(80-150) 167/95 mmHg (11/20 0800) SpO2:  [92 %-97 %] 97 % (11/20 0800) FiO2 (%):  [40 %-50 %] 40 % (11/20 1035)   HEMODYNAMICS: CVP:  [11 mmHg-20 mmHg] 12 mmHg   VENTILATOR SETTINGS: Vent Mode:  [-] PSV;CPAP FiO2 (%):  [40 %-50 %] 40 % Set Rate:  [16 bmp] 16 bmp Vt Set:  [470 mL] 470 mL PEEP:  [5 cmH20] 5 cmH20 Pressure Support:  [10 cmH20] 10 cmH20 Plateau Pressure:  [17 cmH20-22 cmH20] 22 cmH20   INTAKE / OUTPUT: Intake/Output      11/19 0701 - 11/20 0700 11/20 0701 - 11/21 0700   I.V. (mL/kg)  420 (5) 40 (0.5)   NG/GT 1090 90   IV Piggyback     Total Intake(mL/kg) 1510 (17.8) 130 (1.5)   Urine (mL/kg/hr) 605 (0.3)    Emesis/NG output  15 (0)   Total Output 605 15   Net +905 +115        Stool Occurrence  1 x     PHYSICAL EXAMINATION: General: WDWN female, in NAD. Neuro: Awake, alert, MAE, appropriate HEENT: [place]/AT. PERRL, sclerae anicteric. Cardiovascular: Tachy bur regular, no M/R/G.  Lungs: even/non-labored, clear Abdomen: BS x 4, soft, NT/ND.  Musculoskeletal: No gross deformities, LLE edema > RLE. Skin: Intact, LLE warm, no rashes.  LABS:  CBC  Recent Labs Lab 05/04/14 0459 05/05/14 0420 05/06/14 0620  WBC 9.1 9.2 8.5  HGB 8.9* 9.1* 9.5*  HCT 29.5* 29.0* 29.7*  PLT 227 252 254   BMET  Recent Labs Lab 05/04/14 0459 05/05/14 0420 05/06/14 0620  NA 142 141 143  K 3.7 3.7 3.5*  CL 98 98 99  CO2 34* 33* 33*  BUN 19 34* 41*  CREATININE 1.19* 1.35* 1.14*  GLUCOSE 161* 190* 163*   Electrolytes  Recent Labs Lab 05/04/14 0459 05/05/14 0420 05/06/14 0620  CALCIUM 8.5 9.0 9.3   Sepsis Markers  Recent Labs Lab 05/01/14 2101 05/04/14 0605  LATICACIDVEN <0.30* 0.9   ABG  Recent Labs Lab 05/04/14 0247 05/04/14 0454 05/05/14 0420  PHART 7.149* 7.224* 7.438  PCO2ART ABOVE REPORTABLE RANGE. 79.4* 48.5*  PO2ART 92.8 79.4* 64.6*   Liver Enzymes  Recent Labs Lab 05/01/14 2051 05/04/14 0459  AST 16 21  ALT 17 29  ALKPHOS 70 64  BILITOT <0.2* <0.2*  ALBUMIN 3.3* 2.9*   Cardiac Enzymes  Recent Labs Lab 05/04/14 0459  TROPONINI <0.30    Glucose  Recent Labs Lab 05/05/14 1204 05/05/14 1557 05/05/14 1948 05/06/14 0002 05/06/14 0433 05/06/14 0825  GLUCAP 127* 144* 165* 142* 171* 142*    Imaging Dg Chest Port 1 View  05/06/2014   CLINICAL DATA:  Respiratory distress, acute respiratory failure, intubated patient  EXAM: PORTABLE CHEST - 1 VIEW  COMPARISON:  Portable chest x-ray of May 05, 2014  FINDINGS: The lungs are  adequately inflated. The left hemidiaphragm remains obscured. The pulmonary interstitial markings remain increased bilaterally. Fluid in the minor fissure. There is no pneumothorax. The cardiopericardial silhouette is enlarged. The pulmonary vascularity is engorged and indistinct.  The endotracheal tube tip lies approximately 6 cm above the crotch of the carina. The esophagogastric tube tip projects below the inferior margin of the image. The left internal jugular venous catheter tip projects over the midportion of the SVC.  IMPRESSION: There has been little change in the appearance of the chest since yesterday's study. The findings are consistent with congestive heart failure and pulmonary interstitial and alveolar edema. There are bilateral pleural effusions. The support tubes and lines are in appropriate position.   Electronically Signed   By: David  Martinique   On: 05/06/2014 07:49   Portable Chest Xray  05/05/2014   CLINICAL DATA:  Acute respiratory failure.  EXAM: PORTABLE CHEST - 1 VIEW  COMPARISON:  May 04, 2014.  FINDINGS: Stable cardiomegaly. Endotracheal tube is in grossly good position with distal tip 5.5 cm above the carina. Nasogastric tube is seen entering the stomach. Left internal jugular catheter is unchanged in position with distal tip in the expected position the SVC. Stable central pulmonary vascular congestion is noted. No pneumothorax is noted. Minimal bilateral pleural effusions are noted. Stable bilateral perihilar edema is noted which is more prominent on the right.  IMPRESSION: Stable central pulmonary vascular congestion and asymmetric pulmonary edema is noted. Minimal bilateral pleural effusions are noted. Stable support apparatus.   Electronically Signed   By: Sabino Dick M.D.   On: 05/05/2014 07:10    ASSESSMENT / PLAN:  PULMONARY OETT 11/18 >>> 11/20 A: Acute on chronic hypercarbic and hypoxic respiratory failure 2nd to Benzo's, AECOPD. Pulmonary HTN - noted on ECHO PA  Peak 44 P:   Scheduled BDs with brovana, pulmicort, atrovent, and prn albuterol Reduce solu-medrol to 20 Q12 Consider restart outpatient symbicort, spiriva 11/21. Trend CXR Pulmonary hygiene  CARDIOVASCULAR CVL L IJ 11/18 >>> A:  Hx of HTN, HLD. Enlarged cardiac shadow on CXR with hx of pericardial effusion. Hypotension after intubation 11/18. P:  Continue home hydralazine 25 mg TID Cozaar initiated 11/20, previously on but was held at a prior discharge and she never followed up.  Hx of swelling with lisinopril PRN hydralazine & lopressor for BP / HR control Continue pravastatin  RENAL A:   AKI >> baseline creatinine 1 from 05/01/14. P:   Monitor renal fx, urine outpt, electrolytes  GASTROINTESTINAL A:   Nutrition. P:   Zantac for SUP Home Rifaximin - note from 02/2014, she was supposed to take for 10 days for "bloating/bacterial over growth".  Will not restart, not order on discharge.    HEMATOLOGIC A:  Anemia of chronic disease and critical illness. P:  F/u CBC SQ heparin for DVT prevention  INFECTIOUS A:   LLE Cellulitis. P:   Day 6 of Abx, currently on levaquin, d/c after 11/20 dose  Sputum 11/18 >> neg  ENDOCRINE A:   DM type II with steroid induced hyperglycemia. Hx gout P:  SSI  NEUROLOGIC A:   Acute metabolic encephalopathy 2nd to respiratory failure. Hx Depression, anxiety, panic attacks. P:   Continue zoloft    Family updated:  Called sister 11/20 [name] to update on patients status.    Interdisciplinary Family Meeting v Palliative Care Meeting:  n/a   Noe Gens, NP-C [place] Pgr: [phone] or [phone]   05/06/2014, 10:46 AM   Reviewed above, examined.  She has tolerated pressure support >> proceed with extubation.  Anxiety will be an issue after extubation >> will need to avoid over-sedation.  Continue with supplemental oxygen after extubation and prn BiPAP.  Will start to wean steroids, and d/c Abx  after dose today >> cellulitis looks better.  Will resume anti-HTN regimen.  She will need assessment by cardiothoracic surgery at some point for pericardial effusion.  CC time 35 minutes by me independent of APP time.  [name], MD [facility]/Critical Care 05/06/2014, 11:13 AM Pager:  [phone] After 3pm call: [phone]

## 2014-05-06 NOTE — Procedures (Signed)
Extubation Procedure Note  Patient Details:   Name: [name] DOB: 06/18/1945 MRN: [id number]   Airway Documentation:  AIRWAYS 7.5 mm (Active)  Secured at (cm) 21 cm 05/04/2014 12:00 AM    Evaluation  O2 sats: 94 Complications: none Patient tolerated procedure well. Bilateral Breath Sounds: Clear, Diminished Suctioning: Oral, Airway Pt able to speak  Per CCM order, pt extubated, placed on 4L nasal cannula.  Pt tolerated well, no complications.  Martha Clan 05/06/2014, 10:54 AM

## 2014-05-07 LAB — GLUCOSE, CAPILLARY
GLUCOSE-CAPILLARY: 114 mg/dL — AB (ref 70–99)
Glucose-Capillary: 130 mg/dL — ABNORMAL HIGH (ref 70–99)
Glucose-Capillary: 145 mg/dL — ABNORMAL HIGH (ref 70–99)
Glucose-Capillary: 133 mg/dL — ABNORMAL HIGH (ref 70–99)

## 2014-05-07 LAB — BASIC METABOLIC PANEL
Anion gap: 8 (ref 5–15)
BUN: 37 mg/dL — AB (ref 6–23)
CO2: 35 mEq/L — ABNORMAL HIGH (ref 19–32)
Calcium: 9.5 mg/dL (ref 8.4–10.5)
Chloride: 100 mEq/L (ref 96–112)
Creatinine, Ser: 1.08 mg/dL (ref 0.50–1.10)
GFR calc Af Amer: 60 mL/min — ABNORMAL LOW (ref >90)
GFR calc non Af Amer: 52 mL/min — ABNORMAL LOW (ref >90)
GLUCOSE: 136 mg/dL — AB (ref 70–99)
Potassium: 3.9 mEq/L (ref 3.7–5.3)
Sodium: 143 mEq/L (ref 137–147)

## 2014-05-07 MED ORDER — PANTOPRAZOLE SODIUM 40 MG PO TBEC
40.0000 mg | DELAYED_RELEASE_TABLET | Freq: Every day | ORAL | Status: DC
  Administered 2014-05-07 – 2014-05-09 (×3): 40 mg via ORAL
  Filled 2014-05-07 (×3): qty 1

## 2014-05-07 MED ORDER — QUETIAPINE FUMARATE 50 MG PO TABS
25.0000 mg | ORAL_TABLET | Freq: Two times a day (BID) | ORAL | Status: DC
  Administered 2014-05-07: 25 mg via ORAL
  Filled 2014-05-07: qty 1

## 2014-05-07 MED ORDER — TIOTROPIUM BROMIDE MONOHYDRATE 18 MCG IN CAPS
18.0000 ug | ORAL_CAPSULE | Freq: Every day | RESPIRATORY_TRACT | Status: DC
  Administered 2014-05-07 – 2014-05-09 (×3): 18 ug via RESPIRATORY_TRACT
  Filled 2014-05-07: qty 5

## 2014-05-07 MED ORDER — LEVOFLOXACIN IN D5W 750 MG/150ML IV SOLN
750.0000 mg | INTRAVENOUS | Status: AC
  Administered 2014-05-07: 750 mg via INTRAVENOUS
  Filled 2014-05-07: qty 150

## 2014-05-07 MED ORDER — INSULIN ASPART 100 UNIT/ML ~~LOC~~ SOLN
0.0000 [IU] | Freq: Three times a day (TID) | SUBCUTANEOUS | Status: DC
  Administered 2014-05-07 – 2014-05-09 (×6): 3 [IU] via SUBCUTANEOUS

## 2014-05-07 MED ORDER — BUDESONIDE-FORMOTEROL FUMARATE 160-4.5 MCG/ACT IN AERO
2.0000 | INHALATION_SPRAY | Freq: Two times a day (BID) | RESPIRATORY_TRACT | Status: DC
  Administered 2014-05-07 – 2014-05-09 (×6): 2 via RESPIRATORY_TRACT
  Filled 2014-05-07: qty 6

## 2014-05-07 MED ORDER — INSULIN ASPART 100 UNIT/ML ~~LOC~~ SOLN: 0.0000 [IU] | Freq: Every day | SUBCUTANEOUS | Status: DC

## 2014-05-07 MED ORDER — PREDNISONE 20 MG PO TABS
20.0000 mg | ORAL_TABLET | Freq: Every day | ORAL | Status: DC
  Administered 2014-05-07 – 2014-05-09 (×3): 20 mg via ORAL
  Filled 2014-05-07 (×4): qty 1

## 2014-05-07 NOTE — Progress Notes (Signed)
Called [facility], Dr. Melvyn Novas to request a medication to assist with anxiety. From admission pt has had a high anxiety level which has driven some of her exacerbations. Given order for Seroquel 25mg  bid.   Pt stated she has thought based anxiety which makes her breathing worse. She has expressed concerns about being in the hospital, ventilated and even getting up to [facility] all which raise her anxiety.

## 2014-05-07 NOTE — Progress Notes (Signed)
Received a telemetry alarm for R on T for room 1224, [name].  Performed 12 lead EKG and called E-LINK .  Spoke with Dr. Jimmy Footman and explained circumstances.  Asked if labs were necessary.  No new orders at this time.  Pt is currently asymptomatic and verbally confirms no current chest pain.

## 2014-05-07 NOTE — Progress Notes (Signed)
PULMONARY / CRITICAL CARE MEDICINE   Name: [name] MRN: [id number] DOB: 12/19/1945    ADMISSION DATE:  05/01/2014 CONSULTATION DATE:  05/07/2014  REFERRING MD :  TRH  CHIEF COMPLAINT:  Respiratory Failure  INITIAL PRESENTATION:  68 y/o female presented with LLE cellulitis.  Developed anxiety/tachypnea in hospital tx with ativan >> developed encephalopathy 2nd to hypercapnia requiring intubation.  PCCM assumed care in ICU.  She had recent admit for AECOPD (FEV1 0.85, FEV1% 47 from 2012).  She is followed by Dr. 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  STUDIES:  11/15  L foot XR >> soft tissue swelling, no osseous abnormality 11/17  LE Duplex >> neg for DVT 11/18  Echo >> EF 55 to 60%, mild LVH, grade 1 diastolic dysfx, PAS 44 mmHg, mod/large pericardial effusion >> no tamponade  SIGNIFICANT EVENTS: 11/15  admitted with cellulitis of LLE 11/18  transferred to ICU, intubated 11/19  Cardiology consulted for pericardial effusion 11/20  Extubated to [place] O2 11/21  Change to SDU  SUBJECTIVE:   Has mild hoarseness.  Otherwise feels better.  Denies chest/abdominal pain.  Tolerating diet.  VITAL SIGNS: Temp:  [96.6 F (35.9 C)-98.3 F (36.8 C)] 97.4 F (36.3 C) (11/21 0742) Pulse Rate:  [67-110] 93 (11/21 0700) Resp:  [19-27] 21 (11/21 0700) BP: (149-230)/(80-111) 180/87 mmHg (11/21 0700) SpO2:  [91 %-100 %] 94 % (11/21 0808) FiO2 (%):  [40 %] 40 % (11/20 1035)   INTAKE / OUTPUT: Intake/Output      11/20 0701 - 11/21 0700 11/21 0701 - 11/22 0700   P.O. 120    I.V. (mL/kg) 380 (4.5) 20 (0.2)   NG/GT 217.5    Total Intake(mL/kg) 717.5 (8.5) 20 (0.2)   Urine (mL/kg/hr) 775 (0.4)    Emesis/NG output 15 (0)    Total Output 790     Net -72.5 +20        Stool Occurrence 2 x      PHYSICAL EXAMINATION: General: no distress, speaks in full sentences, sitting up in bed Neuro: normal strength HEENT: no sinus tenderness Cardiovascular:  regular Lungs: decreased breath sounds, no wheeze Abdomen: soft, non tender Musculoskeletal: Lt lower leg redness/edema resolved Skin: no rashes  LABS:  CBC  Recent Labs Lab 05/04/14 0459 05/05/14 0420 05/06/14 0620  WBC 9.1 9.2 8.5  HGB 8.9* 9.1* 9.5*  HCT 29.5* 29.0* 29.7*  PLT 227 252 254   BMET  Recent Labs Lab 05/05/14 0420 05/06/14 0620 05/07/14 0421  NA 141 143 143  K 3.7 3.5* 3.9  CL 98 99 100  CO2 33* 33* 35*  BUN 34* 41* 37*  CREATININE 1.35* 1.14* 1.08  GLUCOSE 190* 163* 136*   Electrolytes  Recent Labs Lab 05/05/14 0420 05/06/14 0620 05/07/14 0421  CALCIUM 9.0 9.3 9.5   Sepsis Markers  Recent Labs Lab 05/01/14 2101 05/04/14 0605  LATICACIDVEN <0.30* 0.9   ABG  Recent Labs Lab 05/04/14 0247 05/04/14 0454 05/05/14 0420  PHART 7.149* 7.224* 7.438  PCO2ART ABOVE REPORTABLE RANGE. 79.4* 48.5*  PO2ART 92.8 79.4* 64.6*   Liver Enzymes  Recent Labs Lab 05/01/14 2051 05/04/14 0459  AST 16 21  ALT 17 29  ALKPHOS 70 64  BILITOT <0.2* <0.2*  ALBUMIN 3.3* 2.9*   Cardiac Enzymes  Recent Labs Lab 05/04/14 0459  TROPONINI <0.30    Glucose  Recent Labs Lab 05/06/14 0433 05/06/14 0825 05/06/14 1139 05/06/14 1511 05/06/14 2116 05/07/14 0720  GLUCAP 171* 142*  120* 139* 148* 130*    Imaging Dg Chest Port 1 View  05/06/2014   CLINICAL DATA:  Respiratory distress, acute respiratory failure, intubated patient  EXAM: PORTABLE CHEST - 1 VIEW  COMPARISON:  Portable chest x-ray of May 05, 2014  FINDINGS: The lungs are adequately inflated. The left hemidiaphragm remains obscured. The pulmonary interstitial markings remain increased bilaterally. Fluid in the minor fissure. There is no pneumothorax. The cardiopericardial silhouette is enlarged. The pulmonary vascularity is engorged and indistinct.  The endotracheal tube tip lies approximately 6 cm above the crotch of the carina. The esophagogastric tube tip projects below the  inferior margin of the image. The left internal jugular venous catheter tip projects over the midportion of the SVC.  IMPRESSION: There has been little change in the appearance of the chest since yesterday's study. The findings are consistent with congestive heart failure and pulmonary interstitial and alveolar edema. There are bilateral pleural effusions. The support tubes and lines are in appropriate position.   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 P:   Resume symbicort, spiriva 11/21 D/c solumedrol 11/21 >> change to prednisone 20 mg and wean off as tolerated F/u CXR as needed Pulmonary hygiene Oxygen to keep SpO2 88 to 94%  CARDIOVASCULAR CVL L IJ 11/18 >>> 11/21 A:  Hx of HTN, HLD. Enlarging pericardial effusion. Hypotension after intubation 11/18 >> resolved. P:  Continue hydralazine, cozaar, pravastatin PRN hydralazine & lopressor for BP / HR control Will need cardiothoracic surgery evaluation when more stable to assess pericardial effusion  RENAL A:   AKI >> baseline creatinine 1 from 05/01/14 >> resolved. P:   Monitor renal fx, urine outpt, electrolytes  GASTROINTESTINAL A:   Nutrition. GERD. Bloating >> prn rifaximin as outpt. P:   Protonix CHO modified diet  HEMATOLOGIC A:   Anemia of chronic disease and critical illness. P:  F/u CBC intermittently SQ heparin for DVT prevention  INFECTIOUS A:   LLE Cellulitis >> much improved. P:   D/c levaquin after dose on 11/21  ENDOCRINE A:   DM type II with steroid induced hyperglycemia. Hx gout P:  SSI  NEUROLOGIC A:   Acute metabolic encephalopathy 2nd to respiratory failure >> resolved. Hx Depression, anxiety, panic attacks. P:   Continue zoloft Limit benzo's, narcotics due to concern about respiratory  disease  SUMMARY: Improving.  Will change to SDU status.  Keep on PCCM service.  Likely ready for d/c home on 11/23.  [name], MD [facility] Pulmonary/Critical Care 05/07/2014, 8:28 AM Pager:  [phone] After 3pm call: [phone]

## 2014-05-07 NOTE — Progress Notes (Signed)
[facility] Progress Note Patient Name: [name] DOB: 1945/11/05 MRN: [id number]   Date of Service  05/07/2014  HPI/Events of Note  Anxiety/ need to avoid sedation  eICU Interventions  seroquel     Intervention Category Minor Interventions: Agitation / anxiety - evaluation and management  Christinia Gully 05/07/2014, 8:03 PM

## 2014-05-08 LAB — CBC
HCT: 34.3 % — ABNORMAL LOW (ref 36.0–46.0)
HEMOGLOBIN: 10 g/dL — AB (ref 12.0–15.0)
MCH: 27.3 pg (ref 26.0–34.0)
MCHC: 29.2 g/dL — ABNORMAL LOW (ref 30.0–36.0)
MCV: 93.7 fL (ref 78.0–100.0)
Platelets: 238 10*3/uL (ref 150–400)
RBC: 3.66 MIL/uL — AB (ref 3.87–5.11)
RDW: 16.4 % — ABNORMAL HIGH (ref 11.5–15.5)
WBC: 8 10*3/uL (ref 4.0–10.5)

## 2014-05-08 LAB — BASIC METABOLIC PANEL
Anion gap: 6 (ref 5–15)
BUN: 41 mg/dL — AB (ref 6–23)
CHLORIDE: 99 meq/L (ref 96–112)
CO2: 35 mEq/L — ABNORMAL HIGH (ref 19–32)
Calcium: 9.2 mg/dL (ref 8.4–10.5)
Creatinine, Ser: 1.24 mg/dL — ABNORMAL HIGH (ref 0.50–1.10)
GFR calc non Af Amer: 44 mL/min — ABNORMAL LOW (ref >90)
GFR, EST AFRICAN AMERICAN: 51 mL/min — AB (ref >90)
Glucose, Bld: 125 mg/dL — ABNORMAL HIGH (ref 70–99)
POTASSIUM: 3.9 meq/L (ref 3.7–5.3)
Sodium: 140 mEq/L (ref 137–147)

## 2014-05-08 LAB — GLUCOSE, CAPILLARY
Glucose-Capillary: 142 mg/dL — ABNORMAL HIGH (ref 70–99)
Glucose-Capillary: 86 mg/dL (ref 70–99)
Glucose-Capillary: 145 mg/dL — ABNORMAL HIGH (ref 70–99)
Glucose-Capillary: 135 mg/dL — ABNORMAL HIGH (ref 70–99)

## 2014-05-08 MED ORDER — QUETIAPINE FUMARATE 25 MG PO TABS
25.0000 mg | ORAL_TABLET | Freq: Two times a day (BID) | ORAL | Status: DC | PRN
  Filled 2014-05-08: qty 1

## 2014-05-08 NOTE — Progress Notes (Signed)
PULMONARY / CRITICAL CARE MEDICINE   Name: [name] MRN: [id number] DOB: [birth year]    ADMISSION DATE:  05/01/2014 CONSULTATION DATE:  05/08/2014  REFERRING MD :  TRH  CHIEF COMPLAINT:  Respiratory Failure  INITIAL PRESENTATION:  68 y/o female presented with LLE cellulitis.  Developed anxiety/tachypnea in hospital tx with ativan >> developed encephalopathy 2nd to hypercapnia requiring intubation.  PCCM assumed care in ICU.  She had recent admit for AECOPD (FEV1 0.85, FEV1% 47 from 2012).  She is followed by Dr. 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  STUDIES:  11/15  L foot XR >> soft tissue swelling, no osseous abnormality 11/17  LE Duplex >> neg for DVT 11/18  Echo >> EF 55 to 60%, mild LVH, grade 1 diastolic dysfx, PAS 44 mmHg, mod/large pericardial effusion >> no tamponade  SIGNIFICANT EVENTS: 11/15  admitted with cellulitis of LLE 11/18  transferred to ICU, intubated 11/19  Cardiology consulted for pericardial effusion 11/20  Extubated to [place] O2 11/21  Change to SDU 11/22  Transfer to telemetry  SUBJECTIVE:   Anxious last night >> given seroquel.  Sleepy this AM.  Breathing better.  Denies leg pain.  VITAL SIGNS: Temp:  [94.5 F (34.7 C)-97.7 F (36.5 C)] 94.5 F (34.7 C) (11/22 0400) Pulse Rate:  [88-107] 95 (11/22 0800) Resp:  [11-29] 26 (11/22 0800) BP: (111-157)/(52-94) 115/60 mmHg (11/22 0800) SpO2:  [87 %-100 %] 99 % (11/22 0800)   INTAKE / OUTPUT: Intake/Output      11/21 0701 - 11/22 0700 11/22 0701 - 11/23 0700   P.O. 860 120   I.V. (mL/kg) 100 (1.2)    NG/GT     IV Piggyback 100    Total Intake(mL/kg) 1060 (12.5) 120 (1.4)   Urine (mL/kg/hr) 730 (0.4)    Emesis/NG output     Total Output 730     Net +330 +120        Urine Occurrence 1 x    Stool Occurrence       PHYSICAL EXAMINATION: General: no distress, speaks in full sentences, sitting up in bed Neuro: normal strength HEENT: no sinus  tenderness Cardiovascular: regular Lungs: decreased breath sounds, no wheeze Abdomen: soft, non tender Musculoskeletal: no edema Skin: no rashes  LABS:  CBC  Recent Labs Lab 05/05/14 0420 05/06/14 0620 05/08/14 0621  WBC 9.2 8.5 8.0  HGB 9.1* 9.5* 10.0*  HCT 29.0* 29.7* 34.3*  PLT 252 254 238   BMET  Recent Labs Lab 05/06/14 0620 05/07/14 0421 05/08/14 0621  NA 143 143 140  K 3.5* 3.9 3.9  CL 99 100 99  CO2 33* 35* 35*  BUN 41* 37* 41*  CREATININE 1.14* 1.08 1.24*  GLUCOSE 163* 136* 125*   Electrolytes  Recent Labs Lab 05/06/14 0620 05/07/14 0421 05/08/14 0621  CALCIUM 9.3 9.5 9.2   Sepsis Markers  Recent Labs Lab 05/01/14 2101 05/04/14 0605  LATICACIDVEN <0.30* 0.9   ABG  Recent Labs Lab 05/04/14 0247 05/04/14 0454 05/05/14 0420  PHART 7.149* 7.224* 7.438  PCO2ART ABOVE REPORTABLE RANGE. 79.4* 48.5*  PO2ART 92.8 79.4* 64.6*   Liver Enzymes  Recent Labs Lab 05/01/14 2051 05/04/14 0459  AST 16 21  ALT 17 29  ALKPHOS 70 64  BILITOT <0.2* <0.2*  ALBUMIN 3.3* 2.9*   Cardiac Enzymes  Recent Labs Lab 05/04/14 0459  TROPONINI <0.30    Glucose  Recent Labs Lab 05/06/14 2116 05/07/14 0720 05/07/14 1117 05/07/14 1711 05/07/14 2135  05/08/14 0802  GLUCAP 148* 130* 145* 133* 114* 142*    Imaging No results found.  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 P:   Continue symbicort, spiriva 11/21 Changed to prednisone 20 mg on 11/21 >> wean off as tolerated F/u CXR as needed Pulmonary hygiene Oxygen to keep SpO2 88 to 94%  CARDIOVASCULAR CVL L IJ 11/18 >>> 11/21 A:  Hx of HTN, HLD. Enlarging pericardial effusion. Hypotension after intubation 11/18 >> resolved. P:  Continue hydralazine, cozaar, pravastatin PRN hydralazine & lopressor for BP / HR control Will need cardiothoracic surgery evaluation when more stable to  assess pericardial effusion  RENAL A:   AKI >> baseline creatinine 1 from 05/01/14 >> resolved. P:   Monitor renal fx, urine outpt, electrolytes  GASTROINTESTINAL A:   Nutrition. GERD. Bloating >> prn rifaximin as outpt. P:   Protonix CHO modified diet  HEMATOLOGIC A:   Anemia of chronic disease and critical illness. P:  F/u CBC intermittently SQ heparin for DVT prevention  INFECTIOUS A:   LLE Cellulitis, completed Abx 11/21. P:   Monitor off Abx  ENDOCRINE A:   DM type II with steroid induced hyperglycemia. Hx gout P:  SSI  NEUROLOGIC A:   Acute metabolic encephalopathy 2nd to respiratory failure >> resolved. Hx Depression, anxiety, panic attacks. P:   Continue zoloft Limit benzo's, narcotics due to concern about respiratory disease Change seroquel to 25 mg qhs  SUMMARY: Improving.  Transfer to telemetry 11/22.  Keep on PCCM service.  Likely ready for d/c home on 11/23 >> will need to determine whether to have TCTS see her before she goes home or arrange for evaluation as outpt.  [name], MD [facility] Pulmonary/Critical Care 05/08/2014, 8:39 AM Pager:  [phone] After 3pm call: [phone]

## 2014-05-08 NOTE — Progress Notes (Signed)
Heated blankets (2) applied.

## 2014-05-09 LAB — GLUCOSE, CAPILLARY
GLUCOSE-CAPILLARY: 107 mg/dL — AB (ref 70–99)
Glucose-Capillary: 137 mg/dL — ABNORMAL HIGH (ref 70–99)
Glucose-Capillary: 128 mg/dL — ABNORMAL HIGH (ref 70–99)
Glucose-Capillary: 92 mg/dL (ref 70–99)

## 2014-05-09 MED ORDER — QUETIAPINE FUMARATE 50 MG PO TABS
25.0000 mg | ORAL_TABLET | Freq: Every day | ORAL | Status: DC
  Administered 2014-05-09: 25 mg via ORAL
  Filled 2014-05-09: qty 1

## 2014-05-09 MED ORDER — FUROSEMIDE 40 MG PO TABS
40.0000 mg | ORAL_TABLET | Freq: Once | ORAL | Status: AC
  Administered 2014-05-09: 40 mg via ORAL
  Filled 2014-05-09: qty 1

## 2014-05-09 NOTE — Progress Notes (Signed)
While patient up to [facility] this AM, respiratory therapist reported to RN that her sats were 74% on 5 L N/C. 50% venturi mask applied and sats up to 91%- RN checked on patient about 30 minutes later and weaned patient back down to 5 L N/C with patient satting at 88-94%. Will continue to monitor.

## 2014-05-09 NOTE — Progress Notes (Addendum)
PULMONARY / CRITICAL CARE MEDICINE   Name: [name] MRN: [id number] DOB: 07/29/1945    ADMISSION DATE:  05/01/2014 CONSULTATION DATE:  05/09/2014  REFERRING MD :  TRH  CHIEF COMPLAINT:  Respiratory Failure  INITIAL PRESENTATION:  68 y/o female presented with LLE cellulitis.  Developed anxiety/tachypnea in hospital tx with ativan >> developed encephalopathy 2nd to hypercapnia requiring intubation.  PCCM assumed care in ICU.  She had recent admit for AECOPD (FEV1 0.85, FEV1% 47 from 2012).  She is followed by Dr. 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  STUDIES:  11/15  L foot XR >> soft tissue swelling, no osseous abnormality 11/17  LE Duplex >> neg for DVT 11/18  Echo >> EF 55 to 60%, mild LVH, grade 1 diastolic dysfx, PAS 44 mmHg, mod/large pericardial effusion >> no tamponade  SIGNIFICANT EVENTS: 11/15  admitted with cellulitis of LLE 11/18  transferred to ICU, intubated 11/19  Cardiology consulted for pericardial effusion 11/20  Extubated to [place] O2 11/21  Change to SDU 11/22  Transfer to telemetry 11/23 CVTS consulted>>  SUBJECTIVE:   Increased WOB She is nervous - wants to talk about and ask questions about her own mortality, lung disease.   VITAL SIGNS: Temp:  [97.4 F (36.3 C)-97.5 F (36.4 C)] 97.5 F (36.4 C) (11/23 0508) Pulse Rate:  [88-105] 105 (11/23 0907) Resp:  [20-24] 24 (11/23 0508) BP: (118-138)/(56-64) 131/64 mmHg (11/23 0907) SpO2:  [74 %-94 %] 93 % (11/23 0907) FiO2 (%):  [50 %] 50 % (11/23 0854)   INTAKE / OUTPUT: Intake/Output      11/22 0701 - 11/23 0700 11/23 0701 - 11/24 0700   P.O. 420    I.V. (mL/kg)     IV Piggyback     Total Intake(mL/kg) 420 (5)    Urine (mL/kg/hr) 250 (0.1)    Total Output 250     Net +170          Urine Occurrence 1 x      PHYSICAL EXAMINATION: General: no distress,sitting up in bed Neuro: normal strength HEENT: no sinus tenderness Cardiovascular: regular Lungs: decreased  breath sounds, no wheeze, increased wob, limited words due to SOB Abdomen: soft, non tender Musculoskeletal: no edema Skin: no rashes  LABS:  CBC  Recent Labs Lab 05/05/14 0420 05/06/14 0620 05/08/14 0621  WBC 9.2 8.5 8.0  HGB 9.1* 9.5* 10.0*  HCT 29.0* 29.7* 34.3*  PLT 252 254 238   BMET  Recent Labs Lab 05/06/14 0620 05/07/14 0421 05/08/14 0621  NA 143 143 140  K 3.5* 3.9 3.9  CL 99 100 99  CO2 33* 35* 35*  BUN 41* 37* 41*  CREATININE 1.14* 1.08 1.24*  GLUCOSE 163* 136* 125*   Electrolytes  Recent Labs Lab 05/06/14 0620 05/07/14 0421 05/08/14 0621  CALCIUM 9.3 9.5 9.2   Sepsis Markers  Recent Labs Lab 05/04/14 0605  LATICACIDVEN 0.9   ABG  Recent Labs Lab 05/04/14 0247 05/04/14 0454 05/05/14 0420  PHART 7.149* 7.224* 7.438  PCO2ART ABOVE REPORTABLE RANGE. 79.4* 48.5*  PO2ART 92.8 79.4* 64.6*   Liver Enzymes  Recent Labs Lab 05/04/14 0459  AST 21  ALT 29  ALKPHOS 64  BILITOT <0.2*  ALBUMIN 2.9*   Cardiac Enzymes  Recent Labs Lab 05/04/14 0459  TROPONINI <0.30    Glucose  Recent Labs Lab 05/07/14 2135 05/08/14 0802 05/08/14 1133 05/08/14 1613 05/08/14 2035 05/09/14 0725  GLUCAP 114* 142* 86 145* 135* 107*  Imaging No results found.  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 P:   Continue symbicort, spiriva 11/21 Changed to prednisone 20 mg on 11/21, plan a slow taper F/u CXR as needed Pulmonary hygiene Oxygen to keep SpO2 88 to 94%  CARDIOVASCULAR CVL L IJ 11/18 >>> 11/21 A:  Hx of HTN, HLD. Enlarging pericardial effusion. Hypotension after intubation 11/18 >> resolved. P:  Continue hydralazine, cozaar, pravastatin PRN hydralazine & lopressor for BP / HR control Will need cardiothoracic surgery evaluation when more stable to assess pericardial effusion. CVTS consult called 11/23 11/23 one dose of  lasix  RENAL A:   AKI >> baseline creatinine 1 from 05/01/14 >> resolved. P:   Monitor renal fx, urine outpt, electrolytes  GASTROINTESTINAL A:   Nutrition. GERD. Bloating >> prn rifaximin as outpt. P:   Protonix CHO modified diet  HEMATOLOGIC A:   Anemia of chronic disease and critical illness. P:  F/u CBC intermittently SQ heparin for DVT prevention  INFECTIOUS A:   LLE Cellulitis, completed Abx 11/21. P:   Monitor off Abx  ENDOCRINE A:   DM type II with steroid induced hyperglycemia. Hx gout P:  SSI  NEUROLOGIC A:   Acute metabolic encephalopathy 2nd to respiratory failure >> resolved. Hx Depression, anxiety, panic attacks. P:   She describes thoughts about her death (not suicide, but her mortality due to lung disease). She tells me that she has had these thoughts for months, that her zoloft was started for this reason. She wants to know if she will go in peace or if she will be uncomfortably. I have tried to reassure her that no matter how her lung disease progresses, we will address her symptoms even if we can not improve her airflow.  Continue zoloft Limit benzo's, narcotics due to concern about respiratory disease Changed seroquel to 25 mg qhs  SUMMARY: Remains SOB and not ready for DC. Will page CVTS to evaluate pericardial effusion per cardiology's recs  Richardson Landry Minor ACNP Maryanna Shape PCCM Pager [phone] till 3 pm If no answer page [phone] 05/09/2014, 11:30 AM  Attending Note I have examined patient, reviewed labs, studies and notes. I have discussed case with S Minor and agree with the data, plans as amended above. Patient has severe COPD, continues to recover slowly from decompensation incited by her LE cellulitis + AE-COPD. She has increased WOB today. Will continue current plans, consider discharge next several days. Will also ask TCTS to comment about any necessary eval or plans regarding her pericardial effusion. Appreciate their assistance.    Baltazar Apo, MD, PhD 05/09/2014, 12:59 PM [place] Pulmonary and Critical [facility] [phone] or if no answer [phone]

## 2014-05-09 NOTE — Plan of Care (Signed)
Problem: Phase I Progression Outcomes Goal: Pain controlled Outcome: Completed/Met Date Met:  05/09/14 Goal: Tolerating diet Outcome: Completed/Met Date Met:  05/09/14

## 2014-05-09 NOTE — Plan of Care (Signed)
Problem: Phase I Progression Outcomes Goal: OOB as tolerated unless otherwise ordered Outcome: Progressing Goal: Initial discharge plan identified Outcome: Progressing Goal: Hemodynamically stable Outcome: Progressing Goal: Wound assessment- dressing change as appropriate Outcome: Completed/Met Date Met:  05/09/14  Problem: Phase II Progression Outcomes Goal: Progress activity as tolerated unless otherwise ordered Outcome: Completed/Met Date Met:  05/09/14 Goal: Discharge plan established Outcome: Progressing Goal: Temperature < 101 Outcome: Completed/Met Date Met:  05/09/14 Goal: Tolerating diet Outcome: Completed/Met Date Met:  05/09/14 Goal: Wound without signs/symptoms of infection, decreasing edema Outcome: Completed/Met Date Met:  05/09/14  Problem: Phase III Progression Outcomes Goal: Activity at appropriate level-compared to baseline (UP IN CHAIR FOR HEMODIALYSIS)  Outcome: Progressing Goal: Temperature < 100 Outcome: Completed/Met Date Met:  05/09/14

## 2014-05-09 NOTE — Progress Notes (Signed)
NUTRITION FOLLOW UP  Intervention:   -Encouraged intake of balanced meals and snacks for recovery needs -Assisted pt in ordering meals; promoting intake of heart healthy proteins, fruits and vegetables -RD to continue to monitor  Nutrition Dx:   Inadequate oral intake related to inability to eat as evidenced by NPO status; ongoing now related to taste changes as evidenced by decreased PO intake/refusal of meals  Goal:  Pt to meet >/= 90% of their estimated nutrition needs    Monitor:   Total protein/energy intake, labs, weights  Assessment:   11/16: Patient identified on the Malnutrition Screening Tool (MST) Report Pt reports UBW of 200 lb when she moved to [place] 4 years ago. Pt's weight has been stable within past year. Pt states her appetite is fair to good. Pt with PMHx of DM, previously saw RD in pulmonary rehab.Current diet order is heart healthy, patient is consuming approximately 100% of meals at this time. Labs and medications reviewed.   11/18: -Pt with encephalopathy and hypercapnic. Intubated on 11/18. Adult enteral protocol ordered. Vital High Protein not currently infusing. -Pt w/OGT, RD consulted for TF management. -Patient is currently intubated on ventilator support MV: 7.6 L/min Temp (24hrs), Avg:95.8 F (35.4 C), Min:94.1 F (34.5 C), Max:98.2 F (36.8 C  11/23: -Pt extubated on 11/20 -Pt advanced to Carb Mod on 11/21 -PO intake varies from 50-80%, with pt occasionally refusing meals d/t decreased appetite and loss of taste -Reviewed ways to improve taste changes, and encouraged intake of heart healthy protein snacks for recovery while appetite returns to baseline -Pt denied dysphagia or difficulty tolerating regular diet textures -MD noted pt with anxiety about disease/mortality, which is likely contributing to decreased appetite -CBG elevated d/t steroids -BUN/Crt elevated d/t AKI  Height: Ht Readings from Last 1 Encounters:  05/04/14 5\' 2"  (1.575 m)     Weight Status:   Wt Readings from Last 1 Encounters:  05/04/14 186 lb 15.9 oz (84.82 kg)    Re-estimated needs:  Kcal: 1500-1700 Protein: 85-95 grm Fluid: >/=1500 ml daily  Skin: WDL  Diet Order: Diet Carb Modified   Intake/Output Summary (Last 24 hours) at 05/09/14 1711 Last data filed at 05/09/14 0830  Gross per 24 hour  Intake    240 ml  Output      0 ml  Net    240 ml    Last BM: 11/21   Labs:   Recent Labs Lab 05/06/14 0620 05/07/14 0421 05/08/14 0621  NA 143 143 140  K 3.5* 3.9 3.9  CL 99 100 99  CO2 33* 35* 35*  BUN 41* 37* 41*  CREATININE 1.14* 1.08 1.24*  CALCIUM 9.3 9.5 9.2  GLUCOSE 163* 136* 125*    CBG (last 3)   Recent Labs  05/09/14 0725 05/09/14 1205 05/09/14 1627  GLUCAP 107* 137* 128*    Scheduled Meds: . budesonide-formoterol  2 puff Inhalation BID  . heparin  5,000 Units Subcutaneous 3 times per day  . hydrALAZINE  25 mg Oral 3 times per day  . insulin aspart  0-20 Units Subcutaneous TID WC  . insulin aspart  0-5 Units Subcutaneous QHS  . losartan  25 mg Oral Daily  . pantoprazole  40 mg Oral Daily  . pravastatin  40 mg Oral q1800  . predniSONE  20 mg Oral Q breakfast  . QUEtiapine  25 mg Oral QHS  . sertraline  50 mg Oral QHS  . sodium chloride  3 mL Intravenous Q12H  . tiotropium  18 mcg Inhalation Daily    Continuous Infusions:   Atlee Abide MS RD LDN Clinical Dietitian [phone]

## 2014-05-10 ENCOUNTER — Other Ambulatory Visit: Payer: Self-pay

## 2014-05-10 ENCOUNTER — Inpatient Hospital Stay (HOSPITAL_COMMUNITY): Payer: Medicare Other | Admitting: Certified Registered"

## 2014-05-10 ENCOUNTER — Ambulatory Visit: Payer: PRIVATE HEALTH INSURANCE | Admitting: Interventional Cardiology

## 2014-05-10 ENCOUNTER — Inpatient Hospital Stay (HOSPITAL_COMMUNITY): Payer: Medicare Other

## 2014-05-10 DIAGNOSIS — R579 Shock, unspecified: Secondary | ICD-10-CM | POA: Insufficient documentation

## 2014-05-10 DIAGNOSIS — I319 Disease of pericardium, unspecified: Secondary | ICD-10-CM

## 2014-05-10 DIAGNOSIS — I313 Pericardial effusion (noninflammatory): Secondary | ICD-10-CM

## 2014-05-10 LAB — COMPREHENSIVE METABOLIC PANEL
ALT: 20 U/L (ref 0–35)
ANION GAP: 11 (ref 5–15)
AST: 14 U/L (ref 0–37)
Albumin: 2.6 g/dL — ABNORMAL LOW (ref 3.5–5.2)
Alkaline Phosphatase: 57 U/L (ref 39–117)
BUN: 62 mg/dL — ABNORMAL HIGH (ref 6–23)
CALCIUM: 8.8 mg/dL (ref 8.4–10.5)
CO2: 31 mEq/L (ref 19–32)
CREATININE: 2.06 mg/dL — AB (ref 0.50–1.10)
Chloride: 103 mEq/L (ref 96–112)
GFR calc Af Amer: 27 mL/min — ABNORMAL LOW (ref >90)
GFR calc non Af Amer: 24 mL/min — ABNORMAL LOW (ref >90)
Glucose, Bld: 122 mg/dL — ABNORMAL HIGH (ref 70–99)
POTASSIUM: 4.3 meq/L (ref 3.7–5.3)
SODIUM: 145 meq/L (ref 137–147)
TOTAL PROTEIN: 5.6 g/dL — AB (ref 6.0–8.3)
Total Bilirubin: 0.4 mg/dL (ref 0.3–1.2)

## 2014-05-10 LAB — BLOOD GAS, ARTERIAL
Acid-Base Excess: 1.6 mmol/L (ref 0.0–2.0)
BICARBONATE: 31.1 meq/L — AB (ref 20.0–24.0)
DRAWN BY: 31814
Drawn by: 232811
FIO2: 1 %
FIO2: 1 %
MECHVT: 400 mL
O2 SAT: 83.1 %
O2 Saturation: 97 %
PATIENT TEMPERATURE: 35.6
PATIENT TEMPERATURE: 98.6
PEEP/CPAP: 5 cmH2O
PH ART: 7.201 — AB (ref 7.350–7.450)
RATE: 20 resp/min
TCO2: 30.5 mmol/L (ref 0–100)
pCO2 arterial: 80.6 mmHg (ref 35.0–45.0)
pH, Arterial: 7.086 — CL (ref 7.350–7.450)
pO2, Arterial: 58.8 mmHg — ABNORMAL LOW (ref 80.0–100.0)
pO2, Arterial: 88.5 mmHg (ref 80.0–100.0)

## 2014-05-10 LAB — GLUCOSE, CAPILLARY
GLUCOSE-CAPILLARY: 132 mg/dL — AB (ref 70–99)
GLUCOSE-CAPILLARY: 88 mg/dL (ref 70–99)
GLUCOSE-CAPILLARY: 124 mg/dL — AB (ref 70–99)
Glucose-Capillary: 94 mg/dL (ref 70–99)
Glucose-Capillary: 103 mg/dL — ABNORMAL HIGH (ref 70–99)

## 2014-05-10 LAB — CBC WITH DIFFERENTIAL/PLATELET
Basophils Absolute: 0 K/uL (ref 0.0–0.1)
Basophils Relative: 0 % (ref 0–1)
Eosinophils Absolute: 0 K/uL (ref 0.0–0.7)
Eosinophils Relative: 0 % (ref 0–5)
HCT: 30.9 % — ABNORMAL LOW (ref 36.0–46.0)
Hemoglobin: 9.2 g/dL — ABNORMAL LOW (ref 12.0–15.0)
Lymphocytes Relative: 6 % — ABNORMAL LOW (ref 12–46)
Lymphs Abs: 0.4 K/uL — ABNORMAL LOW (ref 0.7–4.0)
MCH: 28 pg (ref 26.0–34.0)
MCHC: 29.8 g/dL — ABNORMAL LOW (ref 30.0–36.0)
MCV: 93.9 fL (ref 78.0–100.0)
Monocytes Absolute: 0.4 K/uL (ref 0.1–1.0)
Monocytes Relative: 6 % (ref 3–12)
Neutro Abs: 5.8 K/uL (ref 1.7–7.7)
Neutrophils Relative %: 88 % — ABNORMAL HIGH (ref 43–77)
Platelets: 244 K/uL (ref 150–400)
RBC: 3.29 MIL/uL — ABNORMAL LOW (ref 3.87–5.11)
RDW: 17.1 % — ABNORMAL HIGH (ref 11.5–15.5)
WBC: 6.5 K/uL (ref 4.0–10.5)

## 2014-05-10 LAB — BASIC METABOLIC PANEL
Anion gap: 9 (ref 5–15)
BUN: 60 mg/dL — ABNORMAL HIGH (ref 6–23)
CO2: 33 mEq/L — ABNORMAL HIGH (ref 19–32)
Calcium: 9.1 mg/dL (ref 8.4–10.5)
Chloride: 99 mEq/L (ref 96–112)
Creatinine, Ser: 1.98 mg/dL — ABNORMAL HIGH (ref 0.50–1.10)
GFR calc Af Amer: 29 mL/min — ABNORMAL LOW (ref >90)
GFR, EST NON AFRICAN AMERICAN: 25 mL/min — AB (ref >90)
Glucose, Bld: 139 mg/dL — ABNORMAL HIGH (ref 70–99)
Potassium: 4.4 mEq/L (ref 3.7–5.3)
SODIUM: 141 meq/L (ref 137–147)

## 2014-05-10 LAB — CBC
HCT: 26.4 % — ABNORMAL LOW (ref 36.0–46.0)
HEMOGLOBIN: 8.6 g/dL — AB (ref 12.0–15.0)
MCH: 29 pg (ref 26.0–34.0)
MCHC: 32.6 g/dL (ref 30.0–36.0)
MCV: 88.9 fL (ref 78.0–100.0)
Platelets: 205 10*3/uL (ref 150–400)
RBC: 2.97 MIL/uL — ABNORMAL LOW (ref 3.87–5.11)
RDW: 16.1 % — ABNORMAL HIGH (ref 11.5–15.5)
WBC: 6.3 10*3/uL (ref 4.0–10.5)

## 2014-05-10 LAB — POCT I-STAT 3, ART BLOOD GAS (G3+)
ACID-BASE EXCESS: 8 mmol/L — AB (ref 0.0–2.0)
Bicarbonate: 32.9 mEq/L — ABNORMAL HIGH (ref 20.0–24.0)
O2 SAT: 88 %
Patient temperature: 99.9
TCO2: 34 mmol/L (ref 0–100)
pCO2 arterial: 47.5 mmHg — ABNORMAL HIGH (ref 35.0–45.0)
pH, Arterial: 7.451 — ABNORMAL HIGH (ref 7.350–7.450)
pO2, Arterial: 55 mmHg — ABNORMAL LOW (ref 80.0–100.0)

## 2014-05-10 LAB — URINALYSIS, ROUTINE W REFLEX MICROSCOPIC
Glucose, UA: NEGATIVE mg/dL
KETONES UR: NEGATIVE mg/dL
Nitrite: NEGATIVE
PROTEIN: NEGATIVE mg/dL
Specific Gravity, Urine: 1.017 (ref 1.005–1.030)
UROBILINOGEN UA: 0.2 mg/dL (ref 0.0–1.0)
pH: 5.5 (ref 5.0–8.0)

## 2014-05-10 LAB — URINE MICROSCOPIC-ADD ON

## 2014-05-10 LAB — TYPE AND SCREEN
ABO/RH(D): O POS
Antibody Screen: NEGATIVE

## 2014-05-10 LAB — MAGNESIUM: Magnesium: 2.5 mg/dL (ref 1.5–2.5)

## 2014-05-10 LAB — PROTIME-INR
INR: 1.12 (ref 0.00–1.49)
PROTHROMBIN TIME: 14.5 s (ref 11.6–15.2)

## 2014-05-10 LAB — APTT: APTT: 33 s (ref 24–37)

## 2014-05-10 LAB — TROPONIN I

## 2014-05-10 LAB — PHOSPHORUS: Phosphorus: 6 mg/dL — ABNORMAL HIGH (ref 2.3–4.6)

## 2014-05-10 LAB — LACTIC ACID, PLASMA: Lactic Acid, Venous: 0.8 mmol/L (ref 0.5–2.2)

## 2014-05-10 MED ORDER — PANTOPRAZOLE SODIUM 40 MG IV SOLR
40.0000 mg | Freq: Every day | INTRAVENOUS | Status: DC
  Administered 2014-05-10 – 2014-05-12 (×3): 40 mg via INTRAVENOUS
  Filled 2014-05-10 (×5): qty 40

## 2014-05-10 MED ORDER — FENTANYL CITRATE 0.05 MG/ML IJ SOLN
50.0000 ug | INTRAMUSCULAR | Status: DC | PRN
  Administered 2014-05-10 (×2): 50 ug via INTRAVENOUS
  Filled 2014-05-10 (×2): qty 2

## 2014-05-10 MED ORDER — FENTANYL BOLUS VIA INFUSION
25.0000 ug | INTRAVENOUS | Status: DC | PRN
  Administered 2014-05-11 – 2014-05-12 (×2): 50 ug via INTRAVENOUS
  Filled 2014-05-10: qty 50

## 2014-05-10 MED ORDER — VANCOMYCIN HCL IN DEXTROSE 1-5 GM/200ML-% IV SOLN
1000.0000 mg | INTRAVENOUS | Status: DC
  Administered 2014-05-10: 1000 mg via INTRAVENOUS
  Filled 2014-05-10 (×2): qty 200

## 2014-05-10 MED ORDER — FENTANYL CITRATE 0.05 MG/ML IJ SOLN: 50.0000 ug | Freq: Once | INTRAMUSCULAR | Status: DC

## 2014-05-10 MED ORDER — INSULIN ASPART 100 UNIT/ML ~~LOC~~ SOLN
2.0000 [IU] | SUBCUTANEOUS | Status: DC
  Administered 2014-05-10 – 2014-05-11 (×5): 2 [IU] via SUBCUTANEOUS

## 2014-05-10 MED ORDER — PRAVASTATIN SODIUM 40 MG PO TABS
40.0000 mg | ORAL_TABLET | Freq: Every day | ORAL | Status: DC
  Administered 2014-05-10 – 2014-05-11 (×2): 40 mg
  Filled 2014-05-10 (×4): qty 1

## 2014-05-10 MED ORDER — DEXTROSE 5 % IV SOLN
1.0000 g | Freq: Three times a day (TID) | INTRAVENOUS | Status: DC
  Administered 2014-05-10 – 2014-05-12 (×6): 1 g via INTRAVENOUS
  Filled 2014-05-10 (×8): qty 1

## 2014-05-10 MED ORDER — SODIUM CHLORIDE 0.9 % IV BOLUS (SEPSIS)
500.0000 mL | Freq: Once | INTRAVENOUS | Status: AC
  Administered 2014-05-10: 500 mL via INTRAVENOUS

## 2014-05-10 MED ORDER — CHLORHEXIDINE GLUCONATE 0.12 % MT SOLN
15.0000 mL | Freq: Two times a day (BID) | OROMUCOSAL | Status: DC
  Administered 2014-05-10 – 2014-05-16 (×10): 15 mL via OROMUCOSAL
  Filled 2014-05-10 (×9): qty 15

## 2014-05-10 MED ORDER — CHLORHEXIDINE GLUCONATE 0.12 % MT SOLN
OROMUCOSAL | Status: AC
Start: 2014-05-10 — End: 2014-05-10
  Filled 2014-05-10: qty 15

## 2014-05-10 MED ORDER — BISACODYL 10 MG RE SUPP: 10.0000 mg | Freq: Every day | RECTAL | Status: DC | PRN

## 2014-05-10 MED ORDER — DEXTROSE 5 % IV SOLN
2.0000 g | Freq: Three times a day (TID) | INTRAVENOUS | Status: DC
  Administered 2014-05-10: 2 g via INTRAVENOUS
  Filled 2014-05-10 (×2): qty 2

## 2014-05-10 MED ORDER — PHENYLEPHRINE HCL 10 MG/ML IJ SOLN
30.0000 ug/min | INTRAVENOUS | Status: DC
  Administered 2014-05-10: 30 ug/min via INTRAVENOUS
  Filled 2014-05-10: qty 4

## 2014-05-10 MED ORDER — CETYLPYRIDINIUM CHLORIDE 0.05 % MT LIQD
7.0000 mL | Freq: Four times a day (QID) | OROMUCOSAL | Status: DC
  Administered 2014-05-10 – 2014-05-17 (×18): 7 mL via OROMUCOSAL

## 2014-05-10 MED ORDER — SODIUM CHLORIDE 0.9 % IV SOLN
750.0000 mg | Freq: Two times a day (BID) | INTRAVENOUS | Status: DC
  Filled 2014-05-10: qty 750

## 2014-05-10 MED ORDER — DEXTROSE 5 % IV SOLN
30.0000 ug/min | INTRAVENOUS | Status: DC
  Administered 2014-05-10: 33.333 ug/min via INTRAVENOUS
  Filled 2014-05-10: qty 1

## 2014-05-10 MED ORDER — SODIUM CHLORIDE 0.9 % IV SOLN
0.0000 ug/h | INTRAVENOUS | Status: DC
  Administered 2014-05-10: 50 ug/h via INTRAVENOUS
  Administered 2014-05-11: 175 ug/h via INTRAVENOUS
  Administered 2014-05-11: 200 ug/h via INTRAVENOUS
  Administered 2014-05-12: 150 ug/h via INTRAVENOUS
  Filled 2014-05-10 (×4): qty 50

## 2014-05-10 MED ORDER — IPRATROPIUM-ALBUTEROL 0.5-2.5 (3) MG/3ML IN SOLN
3.0000 mL | Freq: Four times a day (QID) | RESPIRATORY_TRACT | Status: DC
  Administered 2014-05-10 – 2014-05-14 (×17): 3 mL via RESPIRATORY_TRACT
  Filled 2014-05-10 (×16): qty 3

## 2014-05-10 MED ORDER — BISACODYL 5 MG PO TBEC: 5.0000 mg | DELAYED_RELEASE_TABLET | Freq: Every day | ORAL | Status: DC | PRN

## 2014-05-10 MED ORDER — VANCOMYCIN HCL IN DEXTROSE 750-5 MG/150ML-% IV SOLN
750.0000 mg | Freq: Two times a day (BID) | INTRAVENOUS | Status: DC
  Administered 2014-05-10: 750 mg via INTRAVENOUS
  Filled 2014-05-10: qty 150

## 2014-05-10 MED ORDER — METHYLPREDNISOLONE SODIUM SUCC 125 MG IJ SOLR
60.0000 mg | Freq: Two times a day (BID) | INTRAMUSCULAR | Status: DC
  Administered 2014-05-10: 60 mg via INTRAVENOUS
  Filled 2014-05-10 (×2): qty 0.96
  Filled 2014-05-10: qty 2

## 2014-05-10 MED ORDER — ALBUTEROL SULFATE (2.5 MG/3ML) 0.083% IN NEBU: 2.5000 mg | INHALATION_SOLUTION | RESPIRATORY_TRACT | Status: DC | PRN

## 2014-05-10 MED ORDER — METHYLPREDNISOLONE SODIUM SUCC 125 MG IJ SOLR
60.0000 mg | Freq: Four times a day (QID) | INTRAMUSCULAR | Status: DC
  Administered 2014-05-10 – 2014-05-11 (×4): 60 mg via INTRAVENOUS
  Filled 2014-05-10 (×8): qty 0.96

## 2014-05-10 MED ORDER — FENTANYL CITRATE 0.05 MG/ML IJ SOLN
50.0000 ug | INTRAMUSCULAR | Status: DC | PRN
  Administered 2014-05-10: 50 ug via INTRAVENOUS
  Filled 2014-05-10: qty 2

## 2014-05-10 NOTE — Progress Notes (Signed)
   SCD for dvt proph anticipation of surgery    Dr. Brand Males, M.D., [facility].C.P Pulmonary and Critical Care Medicine Staff Physician [place] [facility]monary and Critical Care Pager: [phone], If no answer or between  15:00h - 7:00h: call [phone]  05/10/2014 5:34 PM

## 2014-05-10 NOTE — Progress Notes (Signed)
Echocardiogram 2D Echocardiogram has been performed.  [name] 05/10/2014, 11:18 AM

## 2014-05-10 NOTE — Progress Notes (Signed)
Patient's sister and primary contact Kathrene Alu notified of patient's status and transfer back up to ICU.

## 2014-05-10 NOTE — Plan of Care (Signed)
Problem: Phase I Progression Outcomes Goal: Sedation Protocol initiated if indicated Outcome: Progressing Goal: Pain controlled with appropriate interventions Outcome: Progressing Goal: Hemodynamically stable Outcome: Progressing

## 2014-05-10 NOTE — Progress Notes (Signed)
[place] Progress Note Patient Name: [name] DOB: 1945-10-10 MRN: [id number]   Date of Service  05/10/2014  HPI/Events of Note  Patient with end-stage COPD admitted with with COPD exacerbation intubated and then transitioned to BIPAP.  Transferred to floor on 11/22 now presenting with AMS/hypoxia with sats in the 60s.  Camera check shows hypotension, reduced LOC and sats in the low 80s.  eICU Interventions  Plan: Stat ABG Trial of BiPAP May require re-intubation Re-cycle BP measurement     Intervention Category Major Interventions: Respiratory failure - evaluation and management  DETERDING,ELIZABETH 05/10/2014, 12:28 AM

## 2014-05-10 NOTE — Progress Notes (Signed)
Report received from prior [facility] RN, patient is awake, sitting up in bed at this time, on 50% Venti mask, sats 93% patient appears restless at this time. Lungs sounds diminished throughout. Patient is short of breath at this time.

## 2014-05-10 NOTE — Consult Note (Signed)
[address]       [place] [address]             [phone]        Lorriann W Das [facility] Medical Record #[id number] Date of Birth: 26-Oct-1945  Referring: No ref. provider found Primary Care: Hollace Kinnier, DO  Chief Complaint:    Chief Complaint  Patient presents with  . Leg Swelling    History of Present Illness:    68 year old female transferred from [place] where she presented with 1 week h/o left leg swelling and cellulitis. She also has severe COPD (Gold 4). During the hospitalizaton she has had difficulty with anxiety and tachypnea as well as encephalopathy d/t hypercapnea with resultant intubation. She was extubated on 11/20 but re-intubated today d/t further respiratory distress.  Echocardiogram done today reveals a moderate to large pericardial effusion with early  tamponade physiology. She also has intersticial pulm infiltrates and edema on CXR that could explain her respiratory decompensation with concern for a septic component of low blood pressures/shock.    We are consulted for consideration of drainage by pericardial window.     Current Activity/ Functional Status: intubated on mechanical ventillation   Zubrod Score: At the time of surgery this patient's most appropriate activity status/level should be described as: []     0    Normal activity, no symptoms []     1    Restricted in physical strenuous activity but ambulatory, able to do out light work []     2    Ambulatory and capable of self care, unable to do work activities, up and about                 more than 50%  Of the time                            []     3    Only limited self care, in bed greater than 50% of waking hours [x]     4    Completely disabled, no self care, confined to bed or chair []     5    Moribund  Past Medical History  Diagnosis Date  . Asthma   . COPD (chronic obstructive pulmonary disease)   . Hypertension   . Diabetes mellitus 09/13/2010   glucophage  . Gout   . Oxygen dependent     pt uses O2   . Pericardial effusion 08/2011    small residual on echo  . Hyperlipidemia   . Type II or unspecified type diabetes mellitus with neurological manifestations, uncontrolled   . Personal history of noncompliance with medical treatment, presenting hazards to health   . Unspecified disease of pericardium   . Hemorrhage of rectum and anus   . Panic disorder without agoraphobia   . Depressive disorder, not elsewhere classified   . Edema   . Anxiety state, unspecified   . Unspecified hereditary and idiopathic peripheral neuropathy   . Type II or unspecified type diabetes mellitus without mention of complication, not stated as uncontrolled   . Other and unspecified hyperlipidemia   . Obesity, unspecified   . Unspecified tinnitus   . Unspecified essential hypertension   . Pneumonia, organism unspecified   . Chronic obstructive asthma, unspecified   . Acute and chronic respiratory failure   . Urinary frequency   . Urgency of urination   . Anemia due  to chronic blood loss 01/04/2013    Past Surgical History  Procedure Laterality Date  . Breast biopsy      both breasts  . Total abdominal hysterectomy    . Esophagogastroduodenoscopy N/A 01/03/2013    Procedure: ESOPHAGOGASTRODUODENOSCOPY (EGD);  Surgeon: Inda Castle, MD;  Location: [facility];  Service: Endoscopy;  Laterality: N/A;  . Colonoscopy N/A 01/05/2013    Procedure: COLONOSCOPY;  Surgeon: Gatha Mayer, MD;  Location: [facility];  Service: Endoscopy;  Laterality: N/A;    History  Smoking status  . Former Smoker -- 1.00 packs/day for 30 years  . Types: Cigarettes  . Quit date: 06/17/2004  Smokeless tobacco  . Never Used    History  Alcohol Use  . 1.0 oz/week  . 2 drink(s) per week    Comment: none iin a month    History   Social History  . Marital Status: Single    Spouse Name: N/A    Number of Children: 2  . Years of Education: N/A   Occupational  History  . retired    Social History Main Topics  . Smoking status: Former Smoker -- 1.00 packs/day for 30 years    Types: Cigarettes    Quit date: 06/17/2004  . Smokeless tobacco: Never Used  . Alcohol Use: 1.0 oz/week    2 drink(s) per week     Comment: none iin a month  . Drug Use: No     Comment: past marijuana use in 1980's.  [name] Sexual Activity: No   Other Topics Concern  . Not on file   Social History Narrative   Lives alone. Kids in [place] but not much contact. One first cousin and 38 second cousins in [place] but no contact. Moved from [place] to [place] in Dec 2011    Allergies  Allergen Reactions  . Lisinopril Swelling    Angioedema   . Advair Diskus [Fluticasone-Salmeterol] Other (See Comments)    Nose bleeds.   . Penicillins     hives  . Levaquin [Levofloxacin] Other (See Comments)    Current Facility-Administered Medications  Medication Dose Route Frequency Provider Last Rate Last Dose  . albuterol (PROVENTIL) (2.5 MG/3ML) 0.083% nebulizer solution 2.5 mg  2.5 mg Nebulization Q2H PRN Corey Harold, NP      . antiseptic oral rinse (CPC / CETYLPYRIDINIUM CHLORIDE 0.05%) solution 7 mL  7 mL Mouth Rinse QID Colbert Coyer, MD   7 mL at 05/10/14 1600  . aztreonam (AZACTAM) 1 g in dextrose 5 % 50 mL IVPB  1 g Intravenous Q8H Dorrene German, RPH   1 g at 05/10/14 1219  . bisacodyl (DULCOLAX) EC tablet 5 mg  5 mg Oral Daily PRN Colbert Coyer, MD       Or  . bisacodyl (DULCOLAX) suppository 10 mg  10 mg Rectal Daily PRN Colbert Coyer, MD      . chlorhexidine (PERIDEX) 0.12 % solution 15 mL  15 mL Mouth Rinse BID Colbert Coyer, MD   15 mL at 05/10/14 0800  . fentaNYL (SUBLIMAZE) 2,500 mcg in sodium chloride 0.9 % 250 mL (10 mcg/mL) infusion  0-400 mcg/hr Intravenous Continuous [name], MD 10 mL/hr at 05/10/14 1400 100 mcg/hr at 05/10/14 1400  . fentaNYL (SUBLIMAZE) bolus via infusion 25-50 mcg  25-50 mcg Intravenous  Q1H PRN [name], MD      . fentaNYL (SUBLIMAZE) injection 50 mcg  50 mcg Intravenous Once [name], MD  50 mcg at 05/10/14 0442  . insulin aspart (novoLOG) injection 2-6 Units  2-6 Units Subcutaneous 6 times per day Corey Harold, NP   2 Units at 05/10/14 [phone]  . ipratropium-albuterol (DUONEB) 0.5-2.5 (3) MG/3ML nebulizer solution 3 mL  3 mL Nebulization Q6H Corey Harold, NP   3 mL at 05/10/14 1443  . lip balm (CARMEX) ointment   Topical PRN Donita Brooks, NP      . methylPREDNISolone sodium succinate (SOLU-MEDROL) 125 mg/2 mL injection 60 mg  60 mg Intravenous Q6H Collene Gobble, MD   60 mg at 05/10/14 1454  . pantoprazole (PROTONIX) injection 40 mg  40 mg Intravenous Daily Colbert Coyer, MD   40 mg at 05/10/14 0920  . phenylephrine (NEO-SYNEPHRINE) 40 mg in dextrose 5 % 250 mL (0.16 mg/mL) infusion  30-200 mcg/min Intravenous Continuous [name], MD   Stopped at 05/10/14 [phone]  . pravastatin (PRAVACHOL) tablet 40 mg  40 mg Per Tube q1800 Corey Harold, NP      . vancomycin (VANCOCIN) IVPB 1000 mg/200 mL premix  1,000 mg Intravenous Q24H Dorrene German, [facility]        Prescriptions prior to admission  Medication Sig Dispense Refill Last Dose  . albuterol (PROAIR HFA) 108 (90 BASE) MCG/ACT inhaler Inhale 2 puffs into the lungs every 4 (four) hours as needed for shortness of breath. 18 g 5 unknown  . albuterol (PROVENTIL) (2.5 MG/3ML) 0.083% nebulizer solution Take 2.5 mg by nebulization every 6 (six) hours as needed for shortness of breath.    05/01/2014 at Unknown time  . budesonide-formoterol (SYMBICORT) 80-4.5 MCG/ACT inhaler Inhale 2 puffs into the lungs 2 (two) times daily. 4 Inhaler 11 04/30/2014 at Unknown time  . hydrALAZINE (APRESOLINE) 25 MG tablet Take 1 tablet (25 mg total) by mouth every 8 (eight) hours. 90 tablet 3 05/01/2014 at Unknown time  . lovastatin (MEVACOR) 40 MG tablet Take 1 tablet (40 mg total) by mouth at bedtime. For hyperlipedemia 30 tablet 3  04/30/2014 at Unknown time  . Multiple Vitamins-Minerals (MULTIVITAMIN ADULTS 50+) TABS Take 1 tablet by mouth daily.   05/01/2014 at Unknown time  . pantoprazole (PROTONIX) 40 MG tablet Take 1 tablet (40 mg total) by mouth daily. 30 tablet 3 05/01/2014 at Unknown time  . promethazine-codeine (PHENERGAN WITH CODEINE) 6.25-10 MG/5ML syrup Take 5 mLs by mouth every 6 (six) hours as needed for cough. 200 mL 0 05/01/2014 at Unknown time  . sertraline (ZOLOFT) 50 MG tablet Take 1 tablet (50 mg total) by mouth at bedtime. 30 tablet 3 04/30/2014 at Unknown time  . tiotropium (SPIRIVA) 18 MCG inhalation capsule Place 1 capsule (18 mcg total) into inhaler and inhale daily. 30 capsule 3 05/01/2014 at Unknown time    Family History  Problem Relation Age of Onset  . COPD Mother   . COPD Sister   . Hypertension Sister   . Cancer Father   . Asthma Father   . Diabetes Other     3 maternal aunts  . Prostate cancer Paternal Uncle      Review of Systems: currently unable to obtain as on ventilator.     Cardiac Review of Systems: Y or N  Chest Pain [    ]  Resting SOB [   ] Exertional SOB  [  ]  Orthopnea [  ]   Pedal Edema [   ]    Palpitations [  ] Syncope  [  ]  Presyncope [   ]  General Review of Systems: [Y] = yes [  ]=no Constitional: recent weight change [  ]; anorexia [  ]; fatigue [  ]; nausea [  ]; night sweats [  ]; fever [  ]; or chills [  ]                                                               Dental: poor dentition[  ]; Last Dentist visit:   Eye : blurred vision [  ]; diplopia [   ]; vision changes [  ];  Amaurosis fugax[  ]; Resp: cough [  ];  wheezing[  ];  hemoptysis[  ]; shortness of breath[  ]; paroxysmal nocturnal dyspnea[  ]; dyspnea on exertion[  ]; or orthopnea[  ];  GI:  gallstones[  ], vomiting[  ];  dysphagia[  ]; melena[  ];  hematochezia [  ]; heartburn[  ];   Hx of  Colonoscopy[  ]; GU: kidney stones [  ]; hematuria[  ];   dysuria [  ];  nocturia[  ];  history of      obstruction [  ]; urinary frequency [  ]             Skin: rash, swelling[  ];, hair loss[  ];  peripheral edema[  ];  or itching[  ]; Musculosketetal: myalgias[  ];  joint swelling[  ];  joint erythema[  ];  joint pain[  ];  back pain[  ];  Heme/Lymph: bruising[  ];  bleeding[  ];  anemia[  ];  Neuro: TIA[  ];  headaches[  ];  stroke[  ];  vertigo[  ];  seizures[  ];   paresthesias[  ];  difficulty walking[  ];  Psych:depression[  ]; anxiety[  ];  Endocrine: diabetes[  ];  thyroid dysfunction[  ];  Immunizations: Flu [  ]; Pneumococcal[  ];  Other:  Physical Exam: BP 161/76 mmHg  Pulse 93  Temp(Src) 100.2 F (37.9 C) (Core (Comment))  Resp 15  Ht 5\' 2"  (1.575 m)  Wt 196 lb 6.9 oz (89.1 kg)  BMI 35.92 kg/m2  SpO2 97%  General appearance: sedated on vent Neurologic: not responsive to verbal stimuli Heart: regularly irregular rhythm and S1, S2 normal Lungs: fairly clear anteriorly Abdomen: soft, non-tender; bowel sounds normal; no masses,  no organomegaly Extremities: + BLE edema, DP pulses palp bilat neck- no JVD, no carotid bruits, no adenopathy    Diagnostic Studies & Laboratory data:     Recent Radiology Findings:   Portable Chest Xray  05/10/2014   CLINICAL DATA:  Post intubation and central line placement. Dropping saturation  EXAM: PORTABLE CHEST - 1 VIEW  COMPARISON:  05/06/2013  FINDINGS: There is an endotracheal tube between the clavicular heads and carina. A gastric suction tube reaches the stomach. Left IJ catheter, tip at the SVC.  Unchanged cardiopericardial enlargement. Cannot exclude pericardial effusion contributing to the enlargement. There is diffuse interstitial coarsening with cephalized blood flow. Hazy, dense bibasilar opacities suggesting atelectasis and pleural fluid. No pneumothorax.  IMPRESSION: 1. Unremarkable positioning of tubes and central line. 2. Pulmonary edema with layering effusions and bibasilar atelectasis. These changes could obscure  pneumonia.   Electronically Signed   By: Roderic Palau  Watts M.D.   On: 05/10/2014 03:47      Recent Lab Findings: Lab Results  Component Value Date   WBC 6.5 05/10/2014   HGB 9.2* 05/10/2014   HCT 30.9* 05/10/2014   PLT 244 05/10/2014   GLUCOSE 139* 05/10/2014   TRIG 159* 01/21/2014   HDL 71 01/21/2014   LDLCALC 155* 01/21/2014   ALT 29 05/04/2014   AST 21 05/04/2014   NA 141 05/10/2014   K 4.4 05/10/2014   CL 99 05/10/2014   CREATININE 1.98* 05/10/2014   BUN 60* 05/10/2014   CO2 33* 05/10/2014   TSH 0.339* 03/16/2011   INR 0.99 01/02/2013   HGBA1C 6.4* 01/21/2014      Assessment / Plan:  COPD (severe) with respiratory failure , poss component of cardiogenic shock related to pericardial effusion with some component of early tamponade.  Multiple other co-morbidities as noted per history with management per CCM .           GOLD,WAYNE E, PA-C 05/10/2014 3:55 PM   Patient seen and examined. Agree with above She is a 68 year old woman with multiple medical issues, including severe COPD and VDRF. She has a large pericardial effusion with some evidence of RA collapse on echo. She is not in tamponade clinically. BP 180/80 with a pulse in the 60's. She is at risk for progression to clinical tamponade. Recommend subxiphoid pericardial window. I discussed the indications, risks, benefits and alternatives with Mrs. Graton who is awake on the ventilator. I also discussed them with her son Ceryl by telephone. They understand the risks include, but are not limited to bleeding, infection, cardiac injury, death. They agree to proceed. Plan OR this afternoon.

## 2014-05-10 NOTE — Progress Notes (Signed)
ANTIBIOTIC CONSULT NOTE - INITIAL  Pharmacy Consult for Azactam/Vancomycin Indication: Sepsis  Allergies  Allergen Reactions  . Lisinopril Swelling    Angioedema   . Advair Diskus [Fluticasone-Salmeterol] Other (See Comments)    Nose bleeds.   . Penicillins     hives  . Levaquin [Levofloxacin] Other (See Comments)    Patient Measurements: Height: 5\' 2"  (157.5 cm) Weight: 196 lb 6.9 oz (89.1 kg) IBW/kg (Calculated) : 50.1   Vital Signs: Temp: 98.6 F (37 C) (11/24 0430) Temp Source: Core (Comment) (11/24 0415) BP: 66/50 mmHg (11/24 0418) Pulse Rate: 78 (11/24 0430) Intake/Output from previous day: 11/23 0701 - 11/24 0700 In: 1226.6 [P.O.:120; I.V.:406.6; IV Piggyback:700] Out: 0  Intake/Output from this shift: Total I/O In: 1106.6 [I.V.:406.6; IV Piggyback:700] Out: 0   Labs:  Recent Labs  05/08/14 0621 05/10/14 0200 05/10/14 0318  WBC 8.0 6.5  --   HGB 10.0* 9.2*  --   PLT 238 244  --   CREATININE 1.24*  --  1.98*   Estimated Creatinine Clearance: 28.2 mL/min (by C-G formula based on Cr of 1.98). No results for input(s): VANCOTROUGH, VANCOPEAK, VANCORANDOM, GENTTROUGH, GENTPEAK, GENTRANDOM, TOBRATROUGH, TOBRAPEAK, TOBRARND, AMIKACINPEAK, AMIKACINTROU, AMIKACIN in the last 72 hours.   Microbiology: Recent Results (from the past 720 hour(s))  MRSA PCR Screening     Status: None   Collection Time: 04/18/14  6:29 PM  Result Value Ref Range Status   MRSA by PCR NEGATIVE NEGATIVE Final    Comment:        The GeneXpert MRSA Assay (FDA approved for NASAL specimens only), is one component of a comprehensive MRSA colonization surveillance program. It is not intended to diagnose MRSA infection nor to guide or monitor treatment for MRSA infections.   Culture, respiratory (NON-Expectorated)     Status: None   Collection Time: 05/04/14  5:21 AM  Result Value Ref Range Status   Specimen Description TRACHEAL ASPIRATE  Final   Special Requests NONE  Final   Gram Stain   Final    RARE WBC PRESENT, PREDOMINANTLY PMN NO SQUAMOUS EPITHELIAL CELLS SEEN RARE GRAM POSITIVE COCCI IN PAIRS Performed at [facility]    Culture   Final    Non-Pathogenic Oropharyngeal-type Flora Isolated. Performed at [facility]    Report Status 05/06/2014 FINAL  Final  MRSA PCR Screening     Status: None   Collection Time: 05/04/14  8:20 AM  Result Value Ref Range Status   MRSA by PCR NEGATIVE NEGATIVE Final    Comment:        The GeneXpert MRSA Assay (FDA approved for NASAL specimens only), is one component of a comprehensive MRSA colonization surveillance program. It is not intended to diagnose MRSA infection nor to guide or monitor treatment for MRSA infections.     Medical History: Past Medical History  Diagnosis Date  . Asthma   . COPD (chronic obstructive pulmonary disease)   . Hypertension   . Diabetes mellitus 09/13/2010    glucophage  . Gout   . Oxygen dependent     pt uses O2   . Pericardial effusion 08/2011    small residual on echo  . Hyperlipidemia   . Type II or unspecified type diabetes mellitus with neurological manifestations, uncontrolled   . Personal history of noncompliance with medical treatment, presenting hazards to health   . Unspecified disease of pericardium   . Hemorrhage of rectum and anus   . Panic disorder without agoraphobia   .  Depressive disorder, not elsewhere classified   . Edema   . Anxiety state, unspecified   . Unspecified hereditary and idiopathic peripheral neuropathy   . Type II or unspecified type diabetes mellitus without mention of complication, not stated as uncontrolled   . Other and unspecified hyperlipidemia   . Obesity, unspecified   . Unspecified tinnitus   . Unspecified essential hypertension   . Pneumonia, organism unspecified   . Chronic obstructive asthma, unspecified   . Acute and chronic respiratory failure   . Urinary frequency   . Urgency of urination   . Anemia  due to chronic blood loss 01/04/2013    Medications:  Scheduled:  . antiseptic oral rinse  7 mL Mouth Rinse QID  . aztreonam  2 g Intravenous Q8H  . chlorhexidine  15 mL Mouth Rinse BID  . fentaNYL  50 mcg Intravenous Once  . insulin aspart  2-6 Units Subcutaneous 6 times per day  . ipratropium-albuterol  3 mL Nebulization Q6H  . methylPREDNISolone (SOLU-MEDROL) injection  60 mg Intravenous Q12H  . pantoprazole (PROTONIX) IV  40 mg Intravenous Daily  . pravastatin  40 mg Per Tube q1800  . sodium chloride  3 mL Intravenous Q12H  . vancomycin  750 mg Intravenous Q12H   Infusions:  . fentaNYL infusion INTRAVENOUS 50 mcg/hr (05/10/14 0448)  . phenylephrine (NEO-SYNEPHRINE) Adult infusion 50 mcg/min (05/10/14 0429)   Assessment: 78 yoF admitted 11/15 with cellulitis now with hypoxia.  Re-intubated. Azactam and Vancomycin per Rx for Sepsis.   Goal of Therapy:  Vancomycin trough level 15-20 mcg/ml  Plan:   Azactam 2Gm x1 then 1 Gm IV q8h  Vancomycin 750mg  x1 then 1Gm IV q24h (wt/scr changed this am)  Dorrene German 05/10/2014,4:59 AM

## 2014-05-10 NOTE — Procedures (Signed)
Central Venous Catheter Insertion Procedure Note [name] [id number] 02-Sep-1945  Procedure: Insertion of Central Venous Catheter Indications: Assessment of intravascular volume, Drug and/or fluid administration and Frequent blood sampling  Procedure Details Consent: Unable to obtain consent because of emergent medical necessity. Time Out: Verified patient identification, verified procedure, site/side was marked, verified correct patient position, special equipment/implants available, medications/allergies/relevent history reviewed, required imaging and test results available.  Performed  Maximum sterile technique was used including antiseptics, cap, gloves, gown, hand hygiene, mask and sheet. Skin prep: Chlorhexidine; local anesthetic administered A antimicrobial bonded/coated triple lumen catheter was placed in the left internal jugular vein using the Seldinger technique. Ultrasound guidance used.Yes.   Catheter placed to 20 cm. Blood aspirated via all 3 ports and then flushed x 3. Line sutured x 2 and dressing applied.  Evaluation Blood flow good Complications: No apparent complications Patient did tolerate procedure well. Chest X-ray ordered to verify placement.  CXR: normal.  Georgann Housekeeper, ACNP [facility] Pulmonology/Critical Care Pager [phone] or [phone]

## 2014-05-10 NOTE — Procedures (Signed)
Arterial Catheter Insertion Procedure Note [name] [id number] 1945-12-22  Procedure: Insertion of Arterial Catheter  Indications: Blood pressure monitoring  Procedure Details Consent: Unable to obtain consent because of emergent medical necessity. Time Out: Verified patient identification, verified procedure, site/side was marked, verified correct patient position, special equipment/implants available, medications/allergies/relevent history reviewed, required imaging and test results available.  Performed  Maximum sterile technique was used including antiseptics, cap, gloves, hand hygiene, mask and sheet. Skin prep: Chlorhexidine; local anesthetic administered 20 gauge catheter was inserted into left radial artery using the Seldinger technique.  Evaluation Blood flow good; BP tracing good. Complications: No apparent complications.  Georgann Housekeeper, ACNP [facility] Pulmonology/Critical Care Pager [phone] or [phone]

## 2014-05-10 NOTE — Progress Notes (Signed)
PULMONARY / CRITICAL CARE MEDICINE   Name: [name] MRN: [id number] DOB: 1946-05-30    ADMISSION DATE:  05/01/2014 CONSULTATION DATE:  05/10/2014  REFERRING MD :  TRH  CHIEF COMPLAINT:  Respiratory Failure  INITIAL PRESENTATION:  68 y/o female presented with LLE cellulitis.  Developed anxiety/tachypnea in hospital tx with ativan >> developed encephalopathy 2nd to hypercapnia requiring intubation.  PCCM assumed care in ICU.  She had recent admit for AECOPD (FEV1 0.85, FEV1% 47 from 2012).  She is followed by Dr. 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  STUDIES:  11/15  L foot XR >> soft tissue swelling, no osseous abnormality 11/17  LE Duplex >> neg for DVT 11/18  Echo >> EF 55 to 60%, mild LVH, grade 1 diastolic dysfx, PAS 44 mmHg, mod/large pericardial effusion >> no tamponade  SIGNIFICANT EVENTS: 11/15  admitted with cellulitis of LLE 11/18  transferred to ICU, intubated 11/19  Cardiology consulted for pericardial effusion 11/20  Extubated to [place] O2 11/21  Change to SDU 11/22  Transfer to telemetry 11/23 CVTS consulted >>  INTERVAL:   11/23 PM - She became progressively more dyspneic, hypoxemic, and lethargic. O2 demands continued to increase. She became obtunded, rapid response was called. She was moved to ICU and was emergently intubated by anesthesia team.   VITAL SIGNS: Temp:  [87.8 F (31 C)-97.9 F (36.6 C)] 95 F (35 C) (11/24 0145) Pulse Rate:  [81-105] 90 (11/24 0145) Resp:  [15-28] 16 (11/24 0145) BP: (42-138)/(12-79) 42/12 mmHg (11/24 0133) SpO2:  [74 %-100 %] 97 % (11/24 0145) Arterial Line BP: (73)/(37) 73/37 mmHg (11/24 0145) FiO2 (%):  [55 %-100 %] 100 % (11/24 0133)   INTAKE / OUTPUT: Intake/Output      11/23 0701 - 11/24 0700   P.O. 120   Total Intake(mL/kg) 120 (1.4)   Net +120       Urine Occurrence 5 x     PHYSICAL EXAMINATION: General: Elderly appearing female, on MV Neuro: Obtunded, sedated on vent HEENT:  [place]/AT, No JVD noted, ETT in place Cardiovascular: RRR, no MRG noted Lungs: minimal air movement, mild wheeze Abdomen: soft, non-distended Musculoskeletal: no edema Skin: no rashes  LABS:  CBC  Recent Labs Lab 05/05/14 0420 05/06/14 0620 05/08/14 0621  WBC 9.2 8.5 8.0  HGB 9.1* 9.5* 10.0*  HCT 29.0* 29.7* 34.3*  PLT 252 254 238   BMET  Recent Labs Lab 05/06/14 0620 05/07/14 0421 05/08/14 0621  NA 143 143 140  K 3.5* 3.9 3.9  CL 99 100 99  CO2 33* 35* 35*  BUN 41* 37* 41*  CREATININE 1.14* 1.08 1.24*  GLUCOSE 163* 136* 125*   Electrolytes  Recent Labs Lab 05/06/14 0620 05/07/14 0421 05/08/14 0621  CALCIUM 9.3 9.5 9.2   Sepsis Markers  Recent Labs Lab 05/04/14 0605  LATICACIDVEN 0.9   ABG  Recent Labs Lab 05/04/14 0454 05/05/14 0420 05/10/14 0030  PHART 7.224* 7.438 7.086*  PCO2ART 79.4* 48.5* ABOVE REPORTABLE RANGE  PO2ART 79.4* 64.6* 58.8*   Liver Enzymes  Recent Labs Lab 05/04/14 0459  AST 21  ALT 29  ALKPHOS 64  BILITOT <0.2*  ALBUMIN 2.9*   Cardiac Enzymes  Recent Labs Lab 05/04/14 0459  TROPONINI <0.30    Glucose  Recent Labs Lab 05/08/14 1613 05/08/14 2035 05/09/14 0725 05/09/14 1205 05/09/14 1627 05/09/14 2236  GLUCAP 145* 135* 107* 137* 128* 92    Imaging No results found.  ASSESSMENT / PLAN:  PULMONARY OETT 11/18 > 11/20, 11/24 >>> A: Acute on chronic hypercarbic and hypoxic respiratory failure 2nd to AECOPD  P:   Full vent support BD's to Nebulized Pred > Solumedrol F/u CXR in AM VAP prevention per protocol  CARDIOVASCULAR CVL L IJ 11/18 >>> 11/21 A:  Shock septic vs cardiogenic, concern for tamponade Enlarging pericardial effusion. Hypotension after intubation 11/18 >> resolved. Hx of HTN, HLD.  P:  Tele Insert CVL Check lactic  Trend troponin STAT Echo r/o tamponade Holding anti-htnsives CVTS consult called 11/23  RENAL A:   AKI >> baseline creatinine 1 from 05/01/14   P:    STAT and follow Bmet  GASTROINTESTINAL A:   Nutrition. GERD. Bloating >> prn rifaximin as outpt. P:   Protonix NPO  HEMATOLOGIC A:   Anemia of chronic disease and critical illness.  P:  STAT and follow CBC SCDs DC heparin, worrisome for tamponade given current symptoms  INFECTIOUS A:   Concern for Septic shock, potential sources include respiratory, cellulitis, urinary LLE Cellulitis, completed Abx 11/21  P:   Pan culture 11/24 Abx: Aztreonam, start date 11/24, day 1 Abx: Vancomycin, start date 11/24, day 1 Consider re-initiation pending CBC, lactic  ENDOCRINE A:   DM type II with steroid induced hyperglycemia. Hx gout  P:  CBG monitoring SSI  NEUROLOGIC A:   Acute metabolic encephalopathy 2nd to respiratory failure. Hx Depression, anxiety, panic attacks.  P:   RASS goal: -1 PRN fentanyl for sedation Limit benzo's, narcotics due to concern about respiratory disease Hold Seroquel, Zoloft   Georgann Housekeeper, ACNP [facility] Pulmonology/Critical Care Pager [phone] or [phone]

## 2014-05-10 NOTE — Progress Notes (Signed)
Patient sats 87% on 50% ventimask, increased work of breathing noted, patient is very lethargic, will open eyes to voice but is not responding verbally at this time. Lungs sounds severely diminished throughout. Rapid response called. Patient placed on 100% nonrebreather, when ventimask taken off patient noted to have very large amount of thick saliva in mask.

## 2014-05-10 NOTE — Progress Notes (Signed)
PULMONARY / CRITICAL CARE MEDICINE   Name: [name] MRN: [id number] DOB: 1946-02-23    ADMISSION DATE:  05/01/2014 CONSULTATION DATE:  05/10/2014  REFERRING MD :  TRH  CHIEF COMPLAINT:  Respiratory Failure  INITIAL PRESENTATION:  68 y/o female presented with LLE cellulitis.  Developed anxiety/tachypnea in hospital tx with ativan >> developed encephalopathy 2nd to hypercapnia requiring intubation.  PCCM assumed care in ICU.  She had recent admit for AECOPD (FEV1 0.85, FEV1% 47 from 2012).  She is followed by Dr. 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  STUDIES:  11/15  L foot XR >> soft tissue swelling, no osseous abnormality 11/17  LE Duplex >> neg for DVT 11/18  Echo >> EF 55 to 60%, mild LVH, grade 1 diastolic dysfx, PAS 44 mmHg, mod/large pericardial effusion >> no tamponade 11/24 stat 2 d>> SIGNIFICANT EVENTS: 11/15  admitted with cellulitis of LLE 11/18  transferred to ICU, intubated 11/19  Cardiology consulted for pericardial effusion 11/20  Extubated to [place] O2 11/21  Change to SDU 11/22  Transfer to telemetry 11/23 CVTS consulted >> 11/24 reintuabted for distress  INTERVAL:   Decompensated last pm with acute hypoxemia and hypercapnia, lethargy.  required vent support,  VITAL SIGNS: Temp:  [87.8 F (31 C)-99.7 F (37.6 C)] 99.7 F (37.6 C) (11/24 0800) Pulse Rate:  [73-105] 82 (11/24 0800) Resp:  [15-28] 20 (11/24 0800) BP: (39-134)/(12-79) 119/73 mmHg (11/24 0622) SpO2:  [74 %-100 %] 100 % (11/24 0800) Arterial Line BP: (73-161)/(37-74) 135/64 mmHg (11/24 0800) FiO2 (%):  [55 %-100 %] 80 % (11/24 0800) Weight:  [196 lb 6.9 oz (89.1 kg)] 196 lb 6.9 oz (89.1 kg) (11/24 0415)   INTAKE / OUTPUT: Intake/Output      11/23 0701 - 11/24 0700 11/24 0701 - 11/25 0700   P.O. 120    I.V. (mL/kg) 442.9 (5)    IV Piggyback 700    Total Intake(mL/kg) 1262.9 (14.2)    Urine (mL/kg/hr) 70 (0)    Total Output 70     Net +1192.9           Urine Occurrence 5 x      PHYSICAL EXAMINATION: General: Elderly appearing female, on MV. sedated Neuro: sedated, rouses to stim and voice, moves spontaneously HEENT: [place]/AT, No JVD noted, ETT in place Cardiovascular: RRR, no MRG noted Lungs: minimal air movement, mild wheeze Abdomen: soft, non-distended Musculoskeletal: no edema Skin: no rashes  LABS:  CBC  Recent Labs Lab 05/06/14 0620 05/08/14 0621 05/10/14 0200  WBC 8.5 8.0 6.5  HGB 9.5* 10.0* 9.2*  HCT 29.7* 34.3* 30.9*  PLT 254 238 244   BMET  Recent Labs Lab 05/07/14 0421 05/08/14 0621 05/10/14 0318  NA 143 140 141  K 3.9 3.9 4.4  CL 100 99 99  CO2 35* 35* 33*  BUN 37* 41* 60*  CREATININE 1.08 1.24* 1.98*  GLUCOSE 136* 125* 139*   Electrolytes  Recent Labs Lab 05/07/14 0421 05/08/14 0621 05/10/14 0318  CALCIUM 9.5 9.2 9.1  MG  --   --  2.5  PHOS  --   --  6.0*   Sepsis Markers  Recent Labs Lab 05/04/14 0605 05/10/14 0230  LATICACIDVEN 0.9 0.8   ABG  Recent Labs Lab 05/05/14 0420 05/10/14 0030 05/10/14 0230  PHART 7.438 7.086* 7.201*  PCO2ART 48.5* ABOVE REPORTABLE RANGE 80.6*  PO2ART 64.6* 58.8* 88.5   Liver Enzymes  Recent Labs Lab 05/04/14 0459  AST 21  ALT 29  ALKPHOS 64  BILITOT <0.2*  ALBUMIN 2.9*   Cardiac Enzymes  Recent Labs Lab 05/04/14 0459 05/10/14 0200  TROPONINI <0.30 <0.30    Glucose  Recent Labs Lab 05/08/14 1613 05/08/14 2035 05/09/14 0725 05/09/14 1205 05/09/14 1627 05/09/14 2236  GLUCAP 145* 135* 107* 137* 128* 92    Imaging Portable Chest Xray  05/10/2014   CLINICAL DATA:  Post intubation and central line placement. Dropping saturation  EXAM: PORTABLE CHEST - 1 VIEW  COMPARISON:  05/06/2013  FINDINGS: There is an endotracheal tube between the clavicular heads and carina. A gastric suction tube reaches the stomach. Left IJ catheter, tip at the SVC.  Unchanged cardiopericardial enlargement. Cannot exclude pericardial effusion contributing  to the enlargement. There is diffuse interstitial coarsening with cephalized blood flow. Hazy, dense bibasilar opacities suggesting atelectasis and pleural fluid. No pneumothorax.  IMPRESSION: 1. Unremarkable positioning of tubes and central line. 2. Pulmonary edema with layering effusions and bibasilar atelectasis. These changes could obscure pneumonia.   Electronically Signed   By: Jorje Guild M.D.   On: 05/10/2014 03:47    ASSESSMENT / PLAN:  PULMONARY OETT 11/18 > 11/20, 11/24 >>> A: Acute on chronic hypercarbic and hypoxic respiratory failure 2nd to AECOPD.  Hypercapneic failure 11/23, presumed due to obstructive disease. Consider possibility of contribution pericardial effusion, but less likely  P:   Full vent support BD's to Nebulized Pred > back to Solumedrol F/u CXR in AM VAP prevention per protocol  CARDIOVASCULAR CVL L IJ 11/18 >>> 11/21 A:  Shock septic vs cardiogenic, initial concern for tamponade based on enlarging pericardial effusion Pericardial effusion, etiology unclear Hx of HTN, HLD.  P:  STAT Echo r/o tamponade, repeat 11/24 Holding anti-htnsives CVTS consult called 11/23 > will plan to move her to [facility], have her evaluated there, review TTE   RENAL Lab Results  Component Value Date   CREATININE 1.98* 05/10/2014   CREATININE 1.24* 05/08/2014   CREATININE 1.08 05/07/2014    A:   Progressive ARF>> baseline creatinine 1 from 05/01/14  P:   Follow BMP with gentle volume Hold diuretics, she received lasix x 1 on 11/23  GASTROINTESTINAL A:   Nutrition. GERD. Bloating >> prn rifaximin as outpt. P:   Protonix NPO  HEMATOLOGIC A:   Anemia of chronic disease and critical illness.  P:   follow CBC SCDs Hold heparin in event she needs a procedure, pericardial drain  INFECTIOUS A:   Concern for Septic shock, potential sources include respiratory, cellulitis, urinary LLE Cellulitis, completed Abx 11/21. Restarted 11/24  P:   Pan culture  11/24 11/24 bc>> 11/24 sputum>> 11/24 UC>>  Abx: Aztreonam, start date 11/24, >> Abx: Vancomycin, start date 11/24, >> Follow lactate, CBC, cx data  ENDOCRINE A:   DM type II with steroid induced hyperglycemia. Hx gout  P:  CBG monitoring SSI  NEUROLOGIC A:   Acute metabolic encephalopathy 2nd to respiratory failure. Hx Depression, anxiety, panic attacks. Sedated on ventr 11/24  P:   RASS goal: -1 PRN fentanyl/versed for sedation, tube tolerance     Discussion:  Severe COPD.  Now intubated. Pulses paradoxus noted but hemodynamically stable. Bedside US does not reveal tamponade appearance but effusion is noted.Will transfer her to [facility] for CVTS evaluation.   Richardson Landry Minor ACNP [facility] PCCM Pager [phone] till 3 pm If no answer page [phone] 05/10/2014, 8:26 AM   Attending Note:  I have examined patient, reviewed notes, labs, studies. I have discussed case with S  Minor and agree with the data and plans as amended above. She continues to have wheeze. Her CXR has B interstitial infiltrates. I suspect the etiology of her decompensation is obstructive disease but must consider cardiogenic process. I will liberalize Vt, get her moved to [facility] for TCTS eval and possible pericardial drainage. My independent CC time 40 minutes  Baltazar Apo, MD, PhD 05/10/2014, 10:52 AM [facility] [phone] or if no answer [phone]

## 2014-05-10 NOTE — Progress Notes (Signed)
Utilization review completed.  

## 2014-05-10 NOTE — Progress Notes (Signed)
Patient transferred to ICU room 1222, report given to [facility], Therapist, sports.

## 2014-05-11 ENCOUNTER — Inpatient Hospital Stay (HOSPITAL_COMMUNITY): Payer: Medicare Other | Admitting: Anesthesiology

## 2014-05-11 ENCOUNTER — Inpatient Hospital Stay (HOSPITAL_COMMUNITY): Payer: Medicare Other

## 2014-05-11 ENCOUNTER — Encounter (HOSPITAL_COMMUNITY)
Admission: EM | Disposition: A | Discharge status: Skilled Nursing Facility | Payer: Self-pay | Source: Home / Self Care | Attending: Emergency Medicine

## 2014-05-11 ENCOUNTER — Encounter (HOSPITAL_COMMUNITY): Payer: Self-pay | Admitting: Anesthesiology

## 2014-05-11 HISTORY — PX: TEE WITHOUT CARDIOVERSION: SHX5443

## 2014-05-11 HISTORY — PX: SUBXYPHOID PERICARDIAL WINDOW: SHX5075

## 2014-05-11 LAB — MAGNESIUM: MAGNESIUM: 2.5 mg/dL (ref 1.5–2.5)

## 2014-05-11 LAB — BASIC METABOLIC PANEL
ANION GAP: 9 (ref 5–15)
BUN: 61 mg/dL — ABNORMAL HIGH (ref 6–23)
CO2: 32 mEq/L (ref 19–32)
Calcium: 8.6 mg/dL (ref 8.4–10.5)
Chloride: 105 mEq/L (ref 96–112)
Creatinine, Ser: 1.99 mg/dL — ABNORMAL HIGH (ref 0.50–1.10)
GFR calc non Af Amer: 25 mL/min — ABNORMAL LOW (ref >90)
GFR, EST AFRICAN AMERICAN: 29 mL/min — AB (ref >90)
Glucose, Bld: 117 mg/dL — ABNORMAL HIGH (ref 70–99)
POTASSIUM: 4.4 meq/L (ref 3.7–5.3)
Sodium: 146 mEq/L (ref 137–147)

## 2014-05-11 LAB — PHOSPHORUS: Phosphorus: 2.7 mg/dL (ref 2.3–4.6)

## 2014-05-11 LAB — URINE CULTURE
COLONY COUNT: NO GROWTH
Culture: NO GROWTH

## 2014-05-11 LAB — GLUCOSE, CAPILLARY
GLUCOSE-CAPILLARY: 140 mg/dL — AB (ref 70–99)
GLUCOSE-CAPILLARY: 133 mg/dL — AB (ref 70–99)
GLUCOSE-CAPILLARY: 158 mg/dL — AB (ref 70–99)
Glucose-Capillary: 129 mg/dL — ABNORMAL HIGH (ref 70–99)
Glucose-Capillary: 135 mg/dL — ABNORMAL HIGH (ref 70–99)
Glucose-Capillary: 136 mg/dL — ABNORMAL HIGH (ref 70–99)

## 2014-05-11 LAB — POCT I-STAT 3, ART BLOOD GAS (G3+)
ACID-BASE EXCESS: 11 mmol/L — AB (ref 0.0–2.0)
BICARBONATE: 35.8 meq/L — AB (ref 20.0–24.0)
O2 Saturation: 89 %
PCO2 ART: 46.9 mmHg — AB (ref 35.0–45.0)
PO2 ART: 54 mmHg — AB (ref 80.0–100.0)
Patient temperature: 99.3
TCO2: 37 mmol/L (ref 0–100)
pH, Arterial: 7.492 — ABNORMAL HIGH (ref 7.350–7.450)

## 2014-05-11 LAB — CBC
HEMATOCRIT: 25.2 % — AB (ref 36.0–46.0)
Hemoglobin: 7.9 g/dL — ABNORMAL LOW (ref 12.0–15.0)
MCH: 26.8 pg (ref 26.0–34.0)
MCHC: 31.3 g/dL (ref 30.0–36.0)
MCV: 85.4 fL (ref 78.0–100.0)
Platelets: 198 10*3/uL (ref 150–400)
RBC: 2.95 MIL/uL — AB (ref 3.87–5.11)
RDW: 16.1 % — AB (ref 11.5–15.5)
WBC: 6 10*3/uL (ref 4.0–10.5)

## 2014-05-11 SURGERY — CREATION, PERICARDIAL WINDOW, SUBXIPHOID APPROACH
Anesthesia: General | Site: Chest

## 2014-05-11 MED ORDER — VANCOMYCIN HCL IN DEXTROSE 1-5 GM/200ML-% IV SOLN
1000.0000 mg | Freq: Two times a day (BID) | INTRAVENOUS | Status: AC
  Administered 2014-05-11: 1000 mg via INTRAVENOUS
  Filled 2014-05-11: qty 200

## 2014-05-11 MED ORDER — SODIUM CHLORIDE 0.45 % IV SOLN
INTRAVENOUS | Status: DC
  Administered 2014-05-11: 17:00:00 via INTRAVENOUS

## 2014-05-11 MED ORDER — PHENYLEPHRINE HCL 10 MG/ML IJ SOLN
10.0000 mg | INTRAVENOUS | Status: DC | PRN
  Administered 2014-05-11: 10 ug/min via INTRAVENOUS

## 2014-05-11 MED ORDER — INSULIN ASPART 100 UNIT/ML ~~LOC~~ SOLN
0.0000 [IU] | SUBCUTANEOUS | Status: DC
  Administered 2014-05-11: 2 [IU] via SUBCUTANEOUS
  Administered 2014-05-11: 3 [IU] via SUBCUTANEOUS
  Administered 2014-05-11 – 2014-05-13 (×7): 2 [IU] via SUBCUTANEOUS

## 2014-05-11 MED ORDER — LIDOCAINE HCL (CARDIAC) 20 MG/ML IV SOLN
INTRAVENOUS | Status: AC
  Filled 2014-05-11: qty 5

## 2014-05-11 MED ORDER — FENTANYL CITRATE 0.05 MG/ML IJ SOLN
INTRAMUSCULAR | Status: DC | PRN
  Administered 2014-05-11 (×5): 50 ug via INTRAVENOUS

## 2014-05-11 MED ORDER — PROTAMINE SULFATE 10 MG/ML IV SOLN
INTRAVENOUS | Status: AC
  Filled 2014-05-11: qty 5

## 2014-05-11 MED ORDER — PROPOFOL 10 MG/ML IV BOLUS
INTRAVENOUS | Status: AC
  Filled 2014-05-11: qty 20

## 2014-05-11 MED ORDER — MIDAZOLAM HCL 2 MG/2ML IJ SOLN
INTRAMUSCULAR | Status: AC
  Filled 2014-05-11: qty 2

## 2014-05-11 MED ORDER — PROPOFOL 10 MG/ML IV BOLUS
INTRAVENOUS | Status: DC | PRN
  Administered 2014-05-11 (×2): 20 mg via INTRAVENOUS

## 2014-05-11 MED ORDER — ONDANSETRON HCL 4 MG/2ML IJ SOLN
4.0000 mg | Freq: Four times a day (QID) | INTRAMUSCULAR | Status: DC | PRN
Start: 2014-05-11 — End: 2014-05-17

## 2014-05-11 MED ORDER — ROCURONIUM BROMIDE 100 MG/10ML IV SOLN
INTRAVENOUS | Status: DC | PRN
  Administered 2014-05-11: 50 mg via INTRAVENOUS

## 2014-05-11 MED ORDER — ACETAMINOPHEN 160 MG/5ML PO SOLN
1000.0000 mg | Freq: Four times a day (QID) | ORAL | Status: AC
  Administered 2014-05-11 – 2014-05-13 (×6): 1000 mg via ORAL
  Filled 2014-05-11 (×11): qty 40

## 2014-05-11 MED ORDER — EPHEDRINE SULFATE 50 MG/ML IJ SOLN
INTRAMUSCULAR | Status: AC
  Filled 2014-05-11: qty 1

## 2014-05-11 MED ORDER — SODIUM CHLORIDE 0.9 % IV SOLN
2500.0000 ug | INTRAVENOUS | Status: DC | PRN
  Administered 2014-05-11: 200 ug/h via INTRAVENOUS

## 2014-05-11 MED ORDER — SENNOSIDES 8.8 MG/5ML PO SYRP
5.0000 mL | ORAL_SOLUTION | Freq: Every day | ORAL | Status: DC
  Administered 2014-05-12 – 2014-05-14 (×4): 5 mL via ORAL
  Filled 2014-05-11 (×7): qty 5

## 2014-05-11 MED ORDER — ACETAMINOPHEN 500 MG PO TABS
1000.0000 mg | ORAL_TABLET | Freq: Four times a day (QID) | ORAL | Status: AC
  Administered 2014-05-13 – 2014-05-15 (×10): 1000 mg via ORAL
  Filled 2014-05-11 (×15): qty 2

## 2014-05-11 MED ORDER — MIDAZOLAM HCL 5 MG/5ML IJ SOLN
INTRAMUSCULAR | Status: DC | PRN
  Administered 2014-05-11: 2 mg via INTRAVENOUS

## 2014-05-11 MED ORDER — SENNOSIDES-DOCUSATE SODIUM 8.6-50 MG PO TABS
1.0000 | ORAL_TABLET | Freq: Every day | ORAL | Status: DC
  Filled 2014-05-11: qty 1

## 2014-05-11 MED ORDER — SODIUM CHLORIDE 0.9 % IR SOLN
Status: DC | PRN
  Administered 2014-05-11: 2000 mL

## 2014-05-11 MED ORDER — FENTANYL CITRATE 0.05 MG/ML IJ SOLN
INTRAMUSCULAR | Status: AC
  Filled 2014-05-11: qty 5

## 2014-05-11 MED ORDER — ROCURONIUM BROMIDE 50 MG/5ML IV SOLN
INTRAVENOUS | Status: AC
  Filled 2014-05-11: qty 2

## 2014-05-11 MED ORDER — LACTATED RINGERS IV SOLN
INTRAVENOUS | Status: DC | PRN
  Administered 2014-05-11: 15:00:00 via INTRAVENOUS

## 2014-05-11 MED ORDER — BISACODYL 5 MG PO TBEC
10.0000 mg | DELAYED_RELEASE_TABLET | Freq: Every day | ORAL | Status: DC
  Filled 2014-05-11: qty 2

## 2014-05-11 MED ORDER — POTASSIUM CHLORIDE 10 MEQ/50ML IV SOLN: 10.0000 meq | Freq: Every day | INTRAVENOUS | Status: DC | PRN

## 2014-05-11 MED ORDER — VANCOMYCIN HCL IN DEXTROSE 1-5 GM/200ML-% IV SOLN
1000.0000 mg | INTRAVENOUS | Status: DC
  Filled 2014-05-11: qty 200

## 2014-05-11 SURGICAL SUPPLY — 47 items
BENZOIN TINCTURE PRP APPL 2/3 (GAUZE/BANDAGES/DRESSINGS) ×4 IMPLANT
CANISTER SUCTION 2500CC (MISCELLANEOUS) ×4 IMPLANT
CATH THORACIC 28FR (CATHETERS) ×4 IMPLANT
CATH THORACIC 28FR RT ANG (CATHETERS) IMPLANT
CATH THORACIC 36FR (CATHETERS) ×4 IMPLANT
CATH THORACIC 36FR RT ANG (CATHETERS) IMPLANT
CLOSURE WOUND 1/2 X4 (GAUZE/BANDAGES/DRESSINGS) ×1
CONT SPEC 4OZ CLIKSEAL STRL BL (MISCELLANEOUS) IMPLANT
COVER SURGICAL LIGHT HANDLE (MISCELLANEOUS) ×8 IMPLANT
DERMABOND ADVANCED (GAUZE/BANDAGES/DRESSINGS) ×2
DERMABOND ADVANCED .7 DNX12 (GAUZE/BANDAGES/DRESSINGS) ×2 IMPLANT
DRAIN CHANNEL 28F RND 3/8 FF (WOUND CARE) IMPLANT
DRAIN CHANNEL 32F RND 10.7 FF (WOUND CARE) IMPLANT
DRAPE LAPAROSCOPIC ABDOMINAL (DRAPES) ×4 IMPLANT
DRAPE WARM FLUID 44X44 (DRAPE) IMPLANT
ELECT REM PT RETURN 9FT ADLT (ELECTROSURGICAL) ×4
ELECTRODE REM PT RTRN 9FT ADLT (ELECTROSURGICAL) ×2 IMPLANT
GAUZE SPONGE 4X4 12PLY STRL (GAUZE/BANDAGES/DRESSINGS) ×8 IMPLANT
GAUZE SPONGE 4X4 16PLY XRAY LF (GAUZE/BANDAGES/DRESSINGS) ×4 IMPLANT
GLOVE EUDERMIC 7 POWDERFREE (GLOVE) ×8 IMPLANT
GOWN STRL REUS W/ TWL XL LVL3 (GOWN DISPOSABLE) ×2 IMPLANT
GOWN STRL REUS W/TWL XL LVL3 (GOWN DISPOSABLE) ×2
KIT BASIN OR (CUSTOM PROCEDURE TRAY) ×4 IMPLANT
KIT ROOM TURNOVER OR (KITS) ×4 IMPLANT
KIT SUCTION CATH 14FR (SUCTIONS) ×4 IMPLANT
NS IRRIG 1000ML POUR BTL (IV SOLUTION) ×4 IMPLANT
PACK CHEST (CUSTOM PROCEDURE TRAY) ×4 IMPLANT
PAD ARMBOARD 7.5X6 YLW CONV (MISCELLANEOUS) ×8 IMPLANT
PAD ELECT DEFIB RADIOL ZOLL (MISCELLANEOUS) ×4 IMPLANT
SPONGE GAUZE 4X4 12PLY STER LF (GAUZE/BANDAGES/DRESSINGS) ×4 IMPLANT
STAPLER VISISTAT 35W (STAPLE) IMPLANT
STRIP CLOSURE SKIN 1/2X4 (GAUZE/BANDAGES/DRESSINGS) ×3 IMPLANT
SUT VIC AB 1 CTX 36 (SUTURE) ×2
SUT VIC AB 1 CTX36XBRD ANBCTR (SUTURE) ×2 IMPLANT
SUT VIC AB 2-0 CTX 36 (SUTURE) ×4 IMPLANT
SUT VIC AB 3-0 X1 27 (SUTURE) ×4 IMPLANT
SWAB COLLECTION DEVICE MRSA (MISCELLANEOUS) IMPLANT
SYR 30ML SLIP (SYRINGE) IMPLANT
SYRINGE 10CC LL (SYRINGE) IMPLANT
SYSTEM SAHARA CHEST DRAIN ATS (WOUND CARE) ×4 IMPLANT
TAPE CLOTH SURG 4X10 WHT LF (GAUZE/BANDAGES/DRESSINGS) ×4 IMPLANT
TOWEL OR 17X24 6PK STRL BLUE (TOWEL DISPOSABLE) ×4 IMPLANT
TOWEL OR 17X26 10 PK STRL BLUE (TOWEL DISPOSABLE) ×8 IMPLANT
TRAP SPECIMEN MUCOUS 40CC (MISCELLANEOUS) IMPLANT
TRAY FOLEY CATH 14FRSI W/METER (CATHETERS) ×4 IMPLANT
TUBE ANAEROBIC SPECIMEN COL (MISCELLANEOUS) IMPLANT
WATER STERILE IRR 1000ML POUR (IV SOLUTION) ×8 IMPLANT

## 2014-05-11 NOTE — Progress Notes (Addendum)
NUTRITION FOLLOW UP  Intervention:    If unable to extubate within next 24 hours, recommend initiate TF via OGT with Vital High Protein at 25 ml/h and Prostat 30 ml BID on day 1; on day 2, d/c Prostat and increase to goal rate of 50 ml/h (1200 ml per day) to provide 1200 kcals (24 kcals/kg ideal weight), 105 gm protein, 1003 ml free water daily.  Nutrition Dx:   Inadequate oral intake related to inability to eat as evidenced by NPO status; ongoing.  Goal:  Intake to meet >90% of estimated nutrition needs. Unmet.  Monitor:   TF initiation/tolerance/adequacy, weight trend, labs, vent status.  Assessment:   68 y/o female presented with LLE cellulitis.Required intubation at [facility] from 11/18 to 11/20. Transferred to [facility] on 11/24 due to pericardial effusion. Re-intubated on 11/24. Poor oral intake prior to transfer to [facility].  Discussed patient in ICU rounds today. Plans for pericardial window procedure today at [facility]. NPO for procedure currently.  Patient is currently intubated on ventilator support MV: 8.2 L/min Temp (24hrs), Avg:99.5 F (37.5 C), Min:98.4 F (36.9 C), Max:100.3 F (37.9 C)  Propofol: none  Height: Ht Readings from Last 1 Encounters:  05/10/14 5\' 2"  (1.575 m)    Weight Status:   Wt Readings from Last 1 Encounters:  05/11/14 197 lb 1.5 oz (89.4 kg)   05/04/14 186 lb 15.9 oz (84.82 kg)   05/01/14 187 lb ADMISSION WEIGHT   Re-estimated needs:  Kcal: 1610 Protein: 100-125 gm Fluid: 1.6 L  Skin: stage 2 pressure ulcer to buttocks  Diet Order: Diet NPO time specified   Intake/Output Summary (Last 24 hours) at 05/11/14 1157 Last data filed at 05/11/14 0800  Gross per 24 hour  Intake 796.75 ml  Output   1120 ml  Net -323.25 ml    Last BM: 11/21   Labs:   Recent Labs Lab 05/10/14 0318 05/10/14 2000 05/11/14 0409  NA 141 145 146  K 4.4 4.3 4.4  CL 99 103 105  CO2 33* 31 32  BUN 60* 62* 61*  CREATININE 1.98* 2.06* 1.99*  CALCIUM 9.1 8.8  8.6  MG 2.5  --  2.5  PHOS 6.0*  --  2.7  GLUCOSE 139* 122* 117*    CBG (last 3)   Recent Labs  05/10/14 2359 05/11/14 0423 05/11/14 0831  GLUCAP 129* 136* 140*    Scheduled Meds: . antiseptic oral rinse  7 mL Mouth Rinse QID  . aztreonam  1 g Intravenous Q8H  . chlorhexidine  15 mL Mouth Rinse BID  . insulin aspart  0-15 Units Subcutaneous 6 times per day  . ipratropium-albuterol  3 mL Nebulization Q6H  . pantoprazole (PROTONIX) IV  40 mg Intravenous Daily  . pravastatin  40 mg Per Tube q1800  . vancomycin  1,000 mg Intravenous Q24H    Continuous Infusions: . fentaNYL infusion INTRAVENOUS 200 mcg/hr (05/11/14 0800)    Molli Barrows, RD, LDN, [place] Pager [phone] After Hours Pager [phone]

## 2014-05-11 NOTE — Transfer of Care (Signed)
Immediate Anesthesia Transfer of Care Note  Patient: [name]  Procedure(s) Performed: Procedure(s): SUBXYPHOID PERICARDIAL WINDOW (N/A) TRANSESOPHAGEAL ECHOCARDIOGRAM (TEE) (N/A)  Patient Location: ICU  Anesthesia Type:General  Level of Consciousness: sedated  Airway & Oxygen Therapy: Patient remains intubated per anesthesia plan and Patient placed on Ventilator (see vital sign flow sheet for setting)  Post-op Assessment: Report given to PACU RN and Post -op Vital signs reviewed and stable  Post vital signs: Reviewed and stable  Complications: No apparent anesthesia complications

## 2014-05-11 NOTE — Anesthesia Postprocedure Evaluation (Signed)
  Anesthesia Post-op Note  Patient: [name]  Procedure(s) Performed: Procedure(s): SUBXYPHOID PERICARDIAL WINDOW (N/A) TRANSESOPHAGEAL ECHOCARDIOGRAM (TEE) (N/A)  Patient Location: PACU and SICU  Anesthesia Type:General  Level of Consciousness: sedated  Airway and Oxygen Therapy: Patient remains intubated per anesthesia plan  Post-op Pain: mild  Post-op Assessment: Post-op Vital signs reviewed  Post-op Vital Signs: Reviewed  Last Vitals:  Filed Vitals:   05/11/14 1300  BP: 160/92  Pulse: 91  Temp: 37.2 C  Resp: 12    Complications: No apparent anesthesia complications

## 2014-05-11 NOTE — Progress Notes (Signed)
ANTIBIOTIC CONSULT NOTE - INITIAL  Pharmacy Consult for vancomycin Indication: rule out sepsis  Allergies  Allergen Reactions  . Lisinopril Swelling    Angioedema   . Advair Diskus [Fluticasone-Salmeterol] Other (See Comments)    Nose bleeds.   . Penicillins     hives  . Levaquin [Levofloxacin] Other (See Comments)    Patient Measurements: Height: 5\' 2"  (157.5 cm) Weight: 197 lb 1.5 oz (89.4 kg) IBW/kg (Calculated) : 50.1  Vital Signs: Temp: 99 F (37.2 C) (11/25 2100) BP: 98/58 mmHg (11/25 2100) Pulse Rate: 60 (11/25 2100) Intake/Output from previous day: 11/24 0701 - 11/25 0700 In: 829.9 [I.V.:419.9; NG/GT:60; IV Piggyback:350] Out: 1080 [Urine:1080] Intake/Output from this shift: Total I/O In: 120 [I.V.:70; IV Piggyback:50] Out: 215 [Urine:75; Emesis/NG output:100; Chest Tube:40]  Labs:  Recent Labs  05/10/14 0200 05/10/14 0318 05/10/14 2000 05/11/14 0409  WBC 6.5  --  6.3 6.0  HGB 9.2*  --  8.6* 7.9*  PLT 244  --  205 198  CREATININE  --  1.98* 2.06* 1.99*   Estimated Creatinine Clearance: 28.1 mL/min (by C-G formula based on Cr of 1.99).    Microbiology: Recent Results (from the past 720 hour(s))  MRSA PCR Screening     Status: None   Collection Time: 04/18/14  6:29 PM  Result Value Ref Range Status   MRSA by PCR NEGATIVE NEGATIVE Final    Comment:        The GeneXpert MRSA Assay (FDA approved for NASAL specimens only), is one component of a comprehensive MRSA colonization surveillance program. It is not intended to diagnose MRSA infection nor to guide or monitor treatment for MRSA infections.   Culture, respiratory (NON-Expectorated)     Status: None   Collection Time: 05/04/14  5:21 AM  Result Value Ref Range Status   Specimen Description TRACHEAL ASPIRATE  Final   Special Requests NONE  Final   Gram Stain   Final    RARE WBC PRESENT, PREDOMINANTLY PMN NO SQUAMOUS EPITHELIAL CELLS SEEN RARE GRAM POSITIVE COCCI IN PAIRS Performed at  [facility]    Culture   Final    Non-Pathogenic Oropharyngeal-type Flora Isolated. Performed at [facility]    Report Status 05/06/2014 FINAL  Final  MRSA PCR Screening     Status: None   Collection Time: 05/04/14  8:20 AM  Result Value Ref Range Status   MRSA by PCR NEGATIVE NEGATIVE Final    Comment:        The GeneXpert MRSA Assay (FDA approved for NASAL specimens only), is one component of a comprehensive MRSA colonization surveillance program. It is not intended to diagnose MRSA infection nor to guide or monitor treatment for MRSA infections.   Culture, blood (routine x 2)     Status: None (Preliminary result)   Collection Time: 05/10/14  3:14 AM  Result Value Ref Range Status   Specimen Description BLOOD RIGHT FOREARM  Final   Special Requests BOTTLES DRAWN AEROBIC ONLY 3CC  Final   Culture  Setup Time   Final    05/10/2014 10:36 Performed at [facility]    Culture   Final           BLOOD CULTURE RECEIVED NO GROWTH TO DATE CULTURE WILL BE HELD FOR 5 DAYS BEFORE ISSUING A FINAL NEGATIVE REPORT Performed at [facility]    Report Status PENDING  Incomplete  Culture, blood (routine x 2)     Status: None (Preliminary result)  Collection Time: 05/10/14  3:19 AM  Result Value Ref Range Status   Specimen Description BLOOD RIGHT ARM  Final   Special Requests BOTTLES DRAWN AEROBIC ONLY 3CC  Final   Culture  Setup Time   Final    05/10/2014 10:37 Performed at [facility]    Culture   Final           BLOOD CULTURE RECEIVED NO GROWTH TO DATE CULTURE WILL BE HELD FOR 5 DAYS BEFORE ISSUING A FINAL NEGATIVE REPORT Performed at [facility]    Report Status PENDING  Incomplete  Culture, Urine     Status: None   Collection Time: 05/10/14  3:49 AM  Result Value Ref Range Status   Specimen Description URINE, CATHETERIZED  Final   Special Requests NONE  Final   Culture  Setup Time   Final    05/10/2014 09:12 Performed at  [place] Performed at [facility]   Final   Culture NO GROWTH Performed at [facility]   Final   Report Status 05/11/2014 FINAL  Final  Culture, respiratory (NON-Expectorated)     Status: None (Preliminary result)   Collection Time: 05/10/14  4:12 AM  Result Value Ref Range Status   Specimen Description TRACHEAL ASPIRATE  Final   Special Requests NONE  Final   Gram Stain   Final    RARE WBC PRESENT, PREDOMINANTLY PMN RARE SQUAMOUS EPITHELIAL CELLS PRESENT RARE GRAM POSITIVE COCCI IN PAIRS Performed at [facility]    Culture   Final    NORMAL OROPHARYNGEAL FLORA Performed at [facility]    Report Status PENDING  Incomplete    Medical History: Past Medical History  Diagnosis Date  . Asthma   . COPD (chronic obstructive pulmonary disease)   . Hypertension   . Diabetes mellitus 09/13/2010    glucophage  . Gout   . Oxygen dependent     pt uses O2   . Pericardial effusion 08/2011    small residual on echo  . Hyperlipidemia   . Type II or unspecified type diabetes mellitus with neurological manifestations, uncontrolled   . Personal history of noncompliance with medical treatment, presenting hazards to health   . Unspecified disease of pericardium   . Hemorrhage of rectum and anus   . Panic disorder without agoraphobia   . Depressive disorder, not elsewhere classified   . Edema   . Anxiety state, unspecified   . Unspecified hereditary and idiopathic peripheral neuropathy   . Type II or unspecified type diabetes mellitus without mention of complication, not stated as uncontrolled   . Other and unspecified hyperlipidemia   . Obesity, unspecified   . Unspecified tinnitus   . Unspecified essential hypertension   . Pneumonia, organism unspecified   . Chronic obstructive asthma, unspecified   . Acute and chronic respiratory failure   . Urinary frequency   . Urgency of urination   . Anemia due to  chronic blood loss 01/04/2013    Medications:  Prescriptions prior to admission  Medication Sig Dispense Refill Last Dose  . albuterol (PROAIR HFA) 108 (90 BASE) MCG/ACT inhaler Inhale 2 puffs into the lungs every 4 (four) hours as needed for shortness of breath. 18 g 5 unknown  . albuterol (PROVENTIL) (2.5 MG/3ML) 0.083% nebulizer solution Take 2.5 mg by nebulization every 6 (six) hours as needed for shortness of breath.    05/01/2014 at Unknown time  .  budesonide-formoterol (SYMBICORT) 80-4.5 MCG/ACT inhaler Inhale 2 puffs into the lungs 2 (two) times daily. 4 Inhaler 11 04/30/2014 at Unknown time  . hydrALAZINE (APRESOLINE) 25 MG tablet Take 1 tablet (25 mg total) by mouth every 8 (eight) hours. 90 tablet 3 05/01/2014 at Unknown time  . lovastatin (MEVACOR) 40 MG tablet Take 1 tablet (40 mg total) by mouth at bedtime. For hyperlipedemia 30 tablet 3 04/30/2014 at Unknown time  . Multiple Vitamins-Minerals (MULTIVITAMIN ADULTS 50+) TABS Take 1 tablet by mouth daily.   05/01/2014 at Unknown time  . pantoprazole (PROTONIX) 40 MG tablet Take 1 tablet (40 mg total) by mouth daily. 30 tablet 3 05/01/2014 at Unknown time  . promethazine-codeine (PHENERGAN WITH CODEINE) 6.25-10 MG/5ML syrup Take 5 mLs by mouth every 6 (six) hours as needed for cough. 200 mL 0 05/01/2014 at Unknown time  . sertraline (ZOLOFT) 50 MG tablet Take 1 tablet (50 mg total) by mouth at bedtime. 30 tablet 3 04/30/2014 at Unknown time  . tiotropium (SPIRIVA) 18 MCG inhalation capsule Place 1 capsule (18 mcg total) into inhaler and inhale daily. 30 capsule 3 05/01/2014 at Unknown time   Scheduled:  . acetaminophen  1,000 mg Oral 4 times per day   Or  . acetaminophen (TYLENOL) oral liquid 160 mg/5 mL  1,000 mg Oral 4 times per day  . antiseptic oral rinse  7 mL Mouth Rinse QID  . aztreonam  1 g Intravenous Q8H  . bisacodyl  10 mg Oral Daily  . chlorhexidine  15 mL Mouth Rinse BID  . insulin aspart  0-15 Units Subcutaneous 6  times per day  . ipratropium-albuterol  3 mL Nebulization Q6H  . pantoprazole (PROTONIX) IV  40 mg Intravenous Daily  . pravastatin  40 mg Per Tube q1800  . sennosides  5 mL Oral QHS   Infusions:  . sodium chloride 50 mL/hr at 05/11/14 1631  . fentaNYL infusion INTRAVENOUS 150 mcg/hr (05/11/14 2305)    Assessment: 68yo female admitted to [facility] 11/15 for cellulitis but developed encephalopathy 2/2 hypercapnia requiring intubation, tx'd 11/24 to [facility] MICU, now s/p TEE and pericardial window, tracheal aspirate gram stain showing GPC in pairs, to begin IV ABX for presumed sepsis (had completed cellulitis ABX 11/21).  Goal of Therapy:  Vancomycin trough level 15-20 mcg/ml  Plan:  Rec'd vanc 1g perioperatively; will continue w/ vancomycin 1000mg  IV Q24H and monitor CBC, Cx, levels prn.  Wynona Neat, PharmD, BCPS  05/11/2014,11:46 PM

## 2014-05-11 NOTE — Progress Notes (Signed)
PULMONARY / CRITICAL CARE MEDICINE   Name: [name] MRN: [id number] DOB: [birth year]    ADMISSION DATE:  05/01/2014 CONSULTATION DATE:  05/11/2014  REFERRING MD :  TRH  CHIEF COMPLAINT:  Respiratory Failure  INITIAL PRESENTATION:  68 y/o female presented with LLE cellulitis.  Developed anxiety/tachypnea in hospital tx with ativan >> developed encephalopathy 2nd to hypercapnia requiring intubation.  PCCM assumed care in ICU.  She had recent admit for AECOPD (FEV1 0.85, FEV1% 47 from 2012).  She is followed by Dr. 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  STUDIES:  11/15  L foot XR >> soft tissue swelling, no osseous abnormality 11/17  LE Duplex >> neg for DVT 11/18  Echo >> EF 55 to 60%, mild LVH, grade 1 diastolic dysfx, PAS 44 mmHg, mod/large pericardial effusion >> no tamponade  SIGNIFICANT EVENTS: 11/15  admitted with cellulitis of LLE 11/18  transferred to ICU, intubated 11/19  Cardiology consulted for pericardial effusion 11/20  Extubated to [place] O2  11/23 CVTS consult - plan pericardial window 11/24 Re-intubated 11/24 Transferred to [facility] from [facility] 11/25 Pericardial window placed  INTERVAL:   RASS -3, Not F/C. No SBT performed due to surgery   VITAL SIGNS: Temp:  [98.4 F (36.9 C)-100.3 F (37.9 C)] 98.9 F (37.2 C) (11/25 1300) Pulse Rate:  [66-97] 91 (11/25 1300) Resp:  [12-17] 12 (11/25 1300) BP: (99-165)/(55-92) 160/92 mmHg (11/25 1300) SpO2:  [92 %-96 %] 96 % (11/25 1306) Arterial Line BP: (105-168)/(56-83) 168/78 mmHg (11/25 1300) FiO2 (%):  [40 %] 40 % (11/25 1600) Weight:  [89.4 kg (197 lb 1.5 oz)] 89.4 kg (197 lb 1.5 oz) (11/25 0600)   INTAKE / OUTPUT: Intake/Output      11/24 0701 - 11/25 0700 11/25 0701 - 11/26 0700   P.O.     I.V. (mL/kg) 419.9 (4.7) 695 (7.8)   NG/GT 60    IV Piggyback 350 50   Total Intake(mL/kg) 829.9 (9.3) 745 (8.3)   Urine (mL/kg/hr) 1080 (0.5) 440 (0.5)   Total Output 1080 440   Net -250.2 +305           PHYSICAL EXAMINATION: General: Elderly appearing female, on MV Neuro: Obtunded, sedated on vent HEENT: [place]/AT, No JVD noted, ETT in place Cardiovascular: RRR, no MRG noted Lungs: minimal air movement, mild wheeze Abdomen: soft, non-distended Musculoskeletal: no edema Skin: no rashes  LABS: I have reviewed all of today's lab results. Relevant abnormalities are discussed in the A/P section  CXR: CM, edema pattern  ASSESSMENT / PLAN:  PULMONARY OETT 11/18 > 11/20, 11/24 >>> A: Acute on chronic hypercarbic and hypoxic respiratory failure AECOPD  P:   Cont full vent support - settings reviewed and/or adjusted Cont vent bundle Daily SBT if/when meets criteria  CARDIOVASCULAR CVL L IJ 11/18 >>> 11/21  L IJ CVL 11/24 >>   L radial A-line 11/24 >>  A:  Shock septic vs cardiogenic, Concern for tamponade Enlarging pericardial effusion. Hx of HTN, HLD. P:  Monitor rhythm and BP  RENAL A:   AKI (baseline Cr 1.0)   P:   Monitor BMET intermittently Monitor I/Os Correct electrolytes as indicated  GASTROINTESTINAL A:   GERD Chronic PPI therapy P:   SUP: IV PPI NPO post op   HEMATOLOGIC A:   Anemia of chronic disease and critical illness. P:  DVT px: SCDs Monitor CBC intermittently Transfuse per usual ICU guidelines  INFECTIOUS A:   Concern for severe sepsis LLE  Cellulitis, completed Abx 11/21 P:   Micro results reviewed Vanc 11/24 >>  Aztreonam 11/24 >>   ENDOCRINE A:   DM type II Hx gout P:  CBG monitoring SSI  NEUROLOGIC A:   Acute encephalopathy Hx Depression, anxiety, panic attacks Chronic SSRI therapy P:   RASS goal: -1 PRN fentanyl for sedation Limit benzo's, narcotics due to concern about respiratory disease Hold Seroquel, Zoloft   CCM X 35 mins  Merton Border, MD ; [facility] service Mobile [phone].  After 5:30 PM or weekends, call [phone]

## 2014-05-11 NOTE — OR Nursing (Signed)
When positioning the patient, hand mittens were removed and jewelry was noted present on bilateral ring fingers.  One gold colored ring with white stones and one red stone found on left hand, one gold colored ring with white stones found on right hand, with one small white stone missing.  Missing stone was observed by M. Dennison Bulla, RN and Catalina Gravel, RN at time of removal from hand.  Both rings placed into specimen cup, labeled with patient label.  Will remain in the OR with patient and returned with patient to room 2M15.

## 2014-05-11 NOTE — Progress Notes (Signed)
Echocardiogram Echocardiogram Transesophageal has been performed.  [name] 05/11/2014, 3:36 PM

## 2014-05-11 NOTE — Brief Op Note (Addendum)
05/01/2014 - 05/11/2014  3:35 PM  PATIENT:  [name]  68 y.o. female  PRE-OPERATIVE DIAGNOSIS:  PERICARDIAL EFFUSION  POST-OPERATIVE DIAGNOSIS:  PERICARDIAL EFFUSION  PROCEDURE:   TRANSESOPHAGEAL ECHOCARDIOGRAM (TEE),  SUBXIPHOID PERICARDIAL WINDOW   FINDINGS: 650 cc pericardial fluid removed  SURGEON:  Surgeon(s) and Role:    * Melrose Nakayama, MD - Primary  PHYSICIAN ASSISTANT: Lars Pinks PA-C  ANESTHESIA:   general  EBL:  Total I/O In: 745 [I.V.:695; IV Piggyback:50] Out: 330 [Urine:330]  BLOOD ADMINISTERED:none  DRAINS: 28 Drain placed in the pericardial space   SPECIMEN:  Source of Specimen:  Pericardial fluid and biopsy  DISPOSITION OF SPECIMEN:  Pathology and culture  COUNTS CORRECT:  YES  PLAN OF CARE: Admit to inpatient   PATIENT DISPOSITION:  ICU - intubated and hemodynamically stable.   Delay start of Pharmacological VTE agent (>24hrs) due to surgical blood loss or risk of bleeding: yes

## 2014-05-11 NOTE — Anesthesia Preprocedure Evaluation (Addendum)
Anesthesia Evaluation  Patient identified by MRN, date of birth, ID bandGeneral Assessment Comment:PT intubated, sedated but arousable.   Airway Mallampati: Intubated       Dental   Pulmonary pneumonia -, COPD COPD inhaler and oxygen dependent, former smoker,    + decreased breath sounds      Cardiovascular hypertension, Pt. on medications +CHF Rhythm:Regular Rate:Normal     Neuro/Psych Anxiety Depression    GI/Hepatic GERD-  ,  Endo/Other  diabetes  Renal/GU      Musculoskeletal   Abdominal   Peds  Hematology   Anesthesia Other Findings   Reproductive/Obstetrics                            Anesthesia Physical Anesthesia Plan  ASA: IV  Anesthesia Plan: General   Post-op Pain Management:    Induction: Intravenous  Airway Management Planned: Oral ETT  Additional Equipment: TEE, Arterial line and CVP  Intra-op Plan:   Post-operative Plan: Post-operative intubation/ventilation  Informed Consent: I have reviewed the patients History and Physical, chart, labs and discussed the procedure including the risks, benefits and alternatives for the proposed anesthesia with the patient or authorized representative who has indicated his/her understanding and acceptance.   History available from chart only (History from chart and RN)  Plan Discussed with: CRNA, Anesthesiologist and Surgeon  Anesthesia Plan Comments:        Anesthesia Quick Evaluation

## 2014-05-12 ENCOUNTER — Inpatient Hospital Stay (HOSPITAL_COMMUNITY): Payer: Medicare Other

## 2014-05-12 DIAGNOSIS — J810 Acute pulmonary edema: Secondary | ICD-10-CM

## 2014-05-12 LAB — BLOOD GAS, ARTERIAL
Acid-Base Excess: 8.9 mmol/L — ABNORMAL HIGH (ref 0.0–2.0)
Bicarbonate: 33.1 mEq/L — ABNORMAL HIGH (ref 20.0–24.0)
DRAWN BY: 36277
FIO2: 0.6 %
MECHVT: 550 mL
O2 Saturation: 93.7 %
PCO2 ART: 46.6 mmHg — AB (ref 35.0–45.0)
PEEP/CPAP: 5 cmH2O
PH ART: 7.465 — AB (ref 7.350–7.450)
Patient temperature: 98.6
RATE: 14 resp/min
TCO2: 34.5 mmol/L (ref 0–100)
pO2, Arterial: 68.2 mmHg — ABNORMAL LOW (ref 80.0–100.0)

## 2014-05-12 LAB — COMPREHENSIVE METABOLIC PANEL
ALBUMIN: 2.5 g/dL — AB (ref 3.5–5.2)
ALK PHOS: 52 U/L (ref 39–117)
ALT: 18 U/L (ref 0–35)
ANION GAP: 9 (ref 5–15)
AST: 15 U/L (ref 0–37)
BILIRUBIN TOTAL: 0.4 mg/dL (ref 0.3–1.2)
BUN: 56 mg/dL — AB (ref 6–23)
CHLORIDE: 107 meq/L (ref 96–112)
CO2: 32 mEq/L (ref 19–32)
Calcium: 8.5 mg/dL (ref 8.4–10.5)
Creatinine, Ser: 1.72 mg/dL — ABNORMAL HIGH (ref 0.50–1.10)
GFR calc Af Amer: 34 mL/min — ABNORMAL LOW (ref >90)
GFR calc non Af Amer: 29 mL/min — ABNORMAL LOW (ref >90)
Glucose, Bld: 104 mg/dL — ABNORMAL HIGH (ref 70–99)
POTASSIUM: 3.9 meq/L (ref 3.7–5.3)
Sodium: 148 mEq/L — ABNORMAL HIGH (ref 137–147)
Total Protein: 5.2 g/dL — ABNORMAL LOW (ref 6.0–8.3)

## 2014-05-12 LAB — CBC
HCT: 27.6 % — ABNORMAL LOW (ref 36.0–46.0)
Hemoglobin: 8.7 g/dL — ABNORMAL LOW (ref 12.0–15.0)
MCH: 27.2 pg (ref 26.0–34.0)
MCHC: 31.5 g/dL (ref 30.0–36.0)
MCV: 86.3 fL (ref 78.0–100.0)
Platelets: 219 10*3/uL (ref 150–400)
RBC: 3.2 MIL/uL — ABNORMAL LOW (ref 3.87–5.11)
RDW: 16.2 % — ABNORMAL HIGH (ref 11.5–15.5)
WBC: 10.2 10*3/uL (ref 4.0–10.5)

## 2014-05-12 LAB — CULTURE, RESPIRATORY W GRAM STAIN

## 2014-05-12 LAB — GLUCOSE, CAPILLARY
GLUCOSE-CAPILLARY: 124 mg/dL — AB (ref 70–99)
GLUCOSE-CAPILLARY: 98 mg/dL (ref 70–99)
Glucose-Capillary: 117 mg/dL — ABNORMAL HIGH (ref 70–99)
Glucose-Capillary: 129 mg/dL — ABNORMAL HIGH (ref 70–99)
Glucose-Capillary: 139 mg/dL — ABNORMAL HIGH (ref 70–99)
Glucose-Capillary: 71 mg/dL (ref 70–99)

## 2014-05-12 LAB — CULTURE, RESPIRATORY

## 2014-05-12 MED ORDER — FUROSEMIDE 10 MG/ML IJ SOLN
40.0000 mg | Freq: Four times a day (QID) | INTRAMUSCULAR | Status: AC
Start: 2014-05-12 — End: 2014-05-12
  Administered 2014-05-12 (×2): 40 mg via INTRAVENOUS
  Filled 2014-05-12 (×2): qty 4

## 2014-05-12 MED ORDER — DEXTROSE 5 % IV SOLN
INTRAVENOUS | Status: DC
  Administered 2014-05-12: 13:00:00 via INTRAVENOUS

## 2014-05-12 MED ORDER — WHITE PETROLATUM GEL: Status: DC | PRN

## 2014-05-12 MED ORDER — POTASSIUM CHLORIDE 20 MEQ/15ML (10%) PO SOLN
40.0000 meq | Freq: Four times a day (QID) | ORAL | Status: DC
  Administered 2014-05-12: 40 meq
  Filled 2014-05-12 (×2): qty 30

## 2014-05-12 MED ORDER — PRAVASTATIN SODIUM 40 MG PO TABS
40.0000 mg | ORAL_TABLET | Freq: Every day | ORAL | Status: DC
Start: 2014-05-12 — End: 2014-05-17
  Administered 2014-05-13 – 2014-05-16 (×4): 40 mg via ORAL
  Filled 2014-05-12 (×7): qty 1

## 2014-05-12 MED ORDER — FENTANYL CITRATE 0.05 MG/ML IJ SOLN: 12.5000 ug | INTRAMUSCULAR | Status: DC | PRN

## 2014-05-12 MED ORDER — POTASSIUM CHLORIDE 10 MEQ/50ML IV SOLN
10.0000 meq | INTRAVENOUS | Status: AC
  Administered 2014-05-12 (×4): 10 meq via INTRAVENOUS
  Filled 2014-05-12 (×4): qty 50

## 2014-05-12 MED ORDER — HYDRALAZINE HCL 20 MG/ML IJ SOLN
10.0000 mg | INTRAMUSCULAR | Status: DC | PRN
  Administered 2014-05-12: 20 mg via INTRAVENOUS
  Filled 2014-05-12 (×2): qty 1

## 2014-05-12 NOTE — Progress Notes (Signed)
BIPAP not need at this time. RT will continue to monitor.

## 2014-05-12 NOTE — Op Note (Signed)
[name], [name]:  [id number]  MEDICAL RECORD NO.:  [id number]  LOCATION:  2M15C                        FACILITY:  [facility]  PHYSICIAN:  Revonda Standard. [name], M.D.DATE OF BIRTH:  26-Aug-1945  DATE OF PROCEDURE:  05/11/2014 DATE OF DISCHARGE:                              OPERATIVE REPORT   PREOPERATIVE DIAGNOSIS:  Pericardial effusion.  POSTOPERATIVE DIAGNOSIS:  Pericardial effusion.  PROCEDURE:  Subxiphoid pericardial window.  SURGEON:  Revonda Standard. [name], M.D.  ASSISTANT:  Lars Pinks, PA-C  ANESTHESIA:  General.  FINDINGS:  Transesophageal echocardiography showed a large pericardial Effusion. Completely drained postprocedure. 650 mL of serous fluid evacuated.  CLINICAL NOTE:  [name] is a 68 year old woman with severe COPD, who was admitted initially with left leg swelling and cellulitis.  She was treated for that, but during her hospitalization, she was intubated for respiratory failure.  As a part of the workup, she had an echocardiogram which showed a moderate to large pericardial effusion. She was transferred to [facility] for additional workup.  A repeat echocardiogram showed a large effusion with some diastolic collapse of the right atrium, although the patient was not clinically in cardiac tamponade.  It was felt that she would benefit from a subxiphoid pericardial window to drain the fluid for diagnostic and therapeutic purposes.  The indications, risks, benefits, and alternatives were discussed with the patient.  Although she was intubated, she was alert and expressed understanding.  Consent was obtained from the patient's son, the same issues were discussed with him by telephone.  OPERATIVE NOTE:  [name] was brought directly from the medical intensive care unit to the operating room on May 11, 2014.  There, she had induction of general anesthesia. She was already intubated. Transesophageal  echocardiography was performed by Dr. Oletta [name] and showed a large pericardial effusion.  The chest and abdomen were prepped and draped in the usual sterile fashion.  The patient was already receiving intravenous antibiotics.  After performing a time-out, a midline incision was made over the tip of the xiphoid process.  It was carried through the skin and subcutaneous tissue.  The xiphoid was identified and the rectus muscle attachments were divided off the xiphoid process. The cartilaginous portion was excised.  A retractor was then placed and upward traction was placed on the lower sternum.  The pericardium was identified and incised, 650 mL of serous fluid were evacuated.  A 2 x 2 cm piece of the pericardium was excised and sent for Pathology.  Fluid was sent for Cytology and cultures.  A sucker then was advanced into the pericardium and moved around all quadrants to make sure that there were no loculated areas of fluid.  Transesophageal echocardiography confirmed complete resolution of the pericardial effusion.  A 28-French Blake drain was placed through a separate stab incision and then placed in the pericardium along the diaphragmatic surface.  It was secured at the skin with a #1 silk suture.  Hemostasis was achieved.  The rectus fascia was closed with a running #1 Vicryl suture.  The subcutaneous tissue and skin were closed in standard fashion.  All sponge, needle, and instrument counts were correct at the end of the procedure.  The patient was taken  from the operating room back to the Medical Intensive Care Unit intubated and in good condition.     Revonda Standard [name], M.D.     SCH/MEDQ  D:  05/11/2014  T:  05/12/2014  Job:  [id number]

## 2014-05-12 NOTE — Procedures (Signed)
Extubation Procedure Note  Patient Details:   Name: [name] DOB: 1946/05/05 MRN: [id number]   Airway Documentation:  AIRWAYS 7.5 mm (Active)  Secured at (cm) 21 cm 05/04/2014 12:00 AM     Airway 7.5 mm (Active)  Secured at (cm) 23 cm 05/12/2014 11:45 AM  Measured From Lips 05/12/2014 11:45 AM  Secured Location Center 05/12/2014 11:45 AM  Secured By [employer] 05/12/2014 11:45 AM  Tube Holder Repositioned Yes 05/12/2014 11:45 AM  Cuff Pressure (cm H2O) 24 cm H2O 05/10/2014  7:13 PM  Site Condition Dry 05/12/2014 11:45 AM    Evaluation  O2 sats: transient fall Complications: No apparent complications Patient did tolerate procedure well. Bilateral Breath Sounds: Diminished, Clear Suctioning: Oral  Yes    Pt extubated to [place], O2 increased to 6l .  Pt tolerated well.  O2 sats dropped into mid 80s, sats currently 92%.  RN at bedside.  RT will continue to monitor. Closson, Deetta Perla 05/12/2014, 1:50 PM

## 2014-05-12 NOTE — Progress Notes (Signed)
PULMONARY / CRITICAL CARE MEDICINE   Name: [name] MRN: [id number] DOB: 1946-05-01    ADMISSION DATE:  05/01/2014 CONSULTATION DATE:  05/12/2014  REFERRING MD :  TRH  CHIEF COMPLAINT:  Respiratory Failure  INITIAL PRESENTATION:  67 y/o female presented with LLE cellulitis.  Developed anxiety/tachypnea in hospital tx with ativan >> developed encephalopathy 2nd to hypercapnia requiring intubation.  PCCM assumed care in ICU.  She had recent admit for AECOPD (FEV1 0.85, FEV1% 47 from 2012).  She is followed by Dr. 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  STUDIES:  11/15  L foot XR >> soft tissue swelling, no osseous abnormality 11/17  LE Duplex >> neg for DVT 11/18  Echo >> EF 55 to 60%, mild LVH, grade 1 diastolic dysfx, PAS 44 mmHg, mod/large pericardial effusion >> no tamponade  SIGNIFICANT EVENTS: 11/15  admitted with cellulitis of LLE 11/18  transferred to ICU, intubated 11/19  Cardiology consulted for pericardial effusion 11/20  Extubated to [place] O2 11/23 CVTS consult - plan pericardial window 11/24 Re-intubated 11/24 Transferred to [facility] from [facility] 11/25 Pericardial window placed 11/26 Extubated. Diuresis for edema pattern on CXR. Empiric abx DC'd  INTERVAL:   RASS 0. Comfortable on PS 10 cm H2O.    VITAL SIGNS: Temp:  [97.2 F (36.2 C)-99.5 F (37.5 C)] 99.5 F (37.5 C) (11/26 1100) Pulse Rate:  [56-91] 70 (11/26 1100) Resp:  [12-22] 16 (11/26 1100) BP: (81-163)/(51-92) 115/70 mmHg (11/26 1100) SpO2:  [92 %-99 %] 95 % (11/26 1100) Arterial Line BP: (74-168)/(41-78) 127/59 mmHg (11/26 1100) FiO2 (%):  [40 %-100 %] 50 % (11/26 0913)   INTAKE / OUTPUT: Intake/Output      11/25 0701 - 11/26 0700 11/26 0701 - 11/27 0700   I.V. (mL/kg) 1699 (19) 50 (0.6)   NG/GT 110    IV Piggyback 150    Total Intake(mL/kg) 1959 (21.9) 50 (0.6)   Urine (mL/kg/hr) 975 (0.5) 325 (0.7)   Emesis/NG output 500 (0.2)    Chest Tube 266 (0.1)    Total Output  1741 325   Net +218 -275          PHYSICAL EXAMINATION: General: NAD Neuro: No focal deficits HEENT: WNL Cardiovascular: Reg, no M Lungs: no wheezes, clear anteriorly Abdomen: soft, non-distended Ext: wrarm, no edema  LABS: I have reviewed all of today's lab results. Relevant abnormalities are discussed in the A/P section  CXR: [place] CM, edema pattern  ASSESSMENT / PLAN:  PULMONARY OETT 11/18 > 11/20, 11/24 >> 11/26 A: Acute on chronic hypercarbic and hypoxic respiratory failure AECOPD Pulm edema P:   Extubate to [place] O2 PRN NPPV Diuresis  CARDIOVASCULAR CVL L IJ 11/18 >>> 11/21  L IJ CVL 11/24 >>   L radial A-line 11/24 >>  A:  Shock septic vs cardiogenic, resolved Concern for tamponade S/p pericardial window 11/25 Hx of HTN, HLD. P:  Monitor rhythm and BP PRN hydralazine to maintain SBP < 170 mmHg  RENAL A:   AKI (baseline Cr 1.0) - nonoliguric. Cr improving Hypervolemia P:   Monitor BMET intermittently Monitor I/Os Correct electrolytes as indicated Diuresis to extent permitted by BP and renal function  GASTROINTESTINAL A:   GERD Chronic PPI therapy P:   SUP: IV PPI NPO post extubation Advance diet as able  HEMATOLOGIC A:   Anemia of chronic disease and critical illness. P:  DVT px: SCDs Monitor CBC intermittently Transfuse per usual ICU guidelines  INFECTIOUS A:  Doubt severe sepsis LLE Cellulitis, completed Abx 11/21 P:   Micro results reviewed Vanc 11/24 >> 11/26 Aztreonam 11/24 >> 11/26   ENDOCRINE A:   DM type II, controlled Hx gout P:  CBG monitoring Cont SSI  NEUROLOGIC A:   Acute encephalopathy, resolved Hx Depression, anxiety, panic attacks Chronic SSRI therapy Post op pain P:   RASS goal: -1 Low dose PRN fentanyl  Holding Seroquel, Zoloft   CCM X 45 mins  Merton Border, MD ; [facility] service Mobile [phone].  After 5:30 PM or weekends, call [phone]

## 2014-05-12 NOTE — Progress Notes (Addendum)
TCTS DAILY ICU PROGRESS NOTE                   [address]             [place] [address]          [phone]   1 Day Post-Op Procedure(s) (LRB): SUBXYPHOID PERICARDIAL WINDOW (N/A) TRANSESOPHAGEAL ECHOCARDIOGRAM (TEE) (N/A)  Total Length of Stay:  LOS: 11 days   Subjective: Awake and alert on vent. Follows commands, nods appropriately to questions. Denies pain.   Objective: Vital signs in last 24 hours: Temp:  [97.2 F (36.2 C)-99.4 F (37.4 C)] 99.4 F (37.4 C) (11/26 0600) Pulse Rate:  [56-91] 71 (11/26 0913) Cardiac Rhythm:  [-] Normal sinus rhythm (11/25 2030) Resp:  [12-22] 14 (11/26 0913) BP: (81-163)/(51-92) 144/68 mmHg (11/26 0913) SpO2:  [93 %-99 %] 96 % (11/26 0913) Arterial Line BP: (81-168)/(43-78) 115/55 mmHg (11/26 0600) FiO2 (%):  [40 %-100 %] 50 % (11/26 0913)  Filed Weights   05/04/14 0400 05/10/14 0415 05/11/14 0600  Weight: 186 lb 15.9 oz (84.82 kg) 196 lb 6.9 oz (89.1 kg) 197 lb 1.5 oz (89.4 kg)    Weight change:    Hemodynamic parameters for last 24 hours:    Intake/Output from previous day: 11/25 0701 - 11/26 0700 In: 1939 [I.V.:1679; NG/GT:110; IV Piggyback:150] Out: 1741 [Urine:975; Emesis/NG output:500; Chest Tube:266]  Intake/Output this shift:    Current Meds: Scheduled Meds: . acetaminophen  1,000 mg Oral 4 times per day   Or  . acetaminophen (TYLENOL) oral liquid 160 mg/5 mL  1,000 mg Oral 4 times per day  . antiseptic oral rinse  7 mL Mouth Rinse QID  . aztreonam  1 g Intravenous Q8H  . bisacodyl  10 mg Oral Daily  . chlorhexidine  15 mL Mouth Rinse BID  . insulin aspart  0-15 Units Subcutaneous 6 times per day  . ipratropium-albuterol  3 mL Nebulization Q6H  . pantoprazole (PROTONIX) IV  40 mg Intravenous Daily  . pravastatin  40 mg Per Tube q1800  . sennosides  5 mL Oral QHS  . vancomycin  1,000 mg Intravenous Q24H   Continuous Infusions: . sodium chloride 50 mL/hr at 05/11/14 1631  . fentaNYL  infusion INTRAVENOUS 200 mcg/hr (05/12/14 0548)   PRN Meds:.albuterol, bisacodyl **OR** bisacodyl, fentaNYL, lip balm, ondansetron (ZOFRAN) IV, potassium chloride   Physical Exam: General appearance: alert, cooperative and no distress Heart: regular rate and rhythm Lungs: Coarse rhonchi bilaterally Wound: Dressed and dry  Lab Results: CBC: Recent Labs  05/11/14 0409 05/12/14 0535  WBC 6.0 10.2  HGB 7.9* 8.7*  HCT 25.2* 27.6*  PLT 198 219   BMET:  Recent Labs  05/11/14 0409 05/12/14 0535  NA 146 148*  K 4.4 3.9  CL 105 107  CO2 32 32  GLUCOSE 117* 104*  BUN 61* 56*  CREATININE 1.99* 1.72*  CALCIUM 8.6 8.5    PT/INR:  Recent Labs  05/10/14 2000  LABPROT 14.5  INR 1.12   Radiology: Dg Chest Port 1 View  05/12/2014   CLINICAL DATA:  COPD. Pericardial window placed 05/11/2014. Respiratory failure.  EXAM: PORTABLE CHEST - 1 VIEW  COMPARISON:  Single view of the chest 05/11/2014 and 05/10/2014.  FINDINGS: Pericardial drain, left IJ catheter, ET tube and NG tube all remain in place. Marked enlargement of the cardiopericardial silhouette appears unchanged since yesterday's examination. Bilateral pleural effusions and airspace disease, worse on the right, appearing change since yesterday's  examination. No pneumothorax is identified.  IMPRESSION: No marked change in bilateral pleural effusions and right worse than left airspace disease.  Marked enlargement of the cardiopericardial silhouette with a pericardial drain in place.   Electronically Signed   By: Inge Rise M.D.   On: 05/12/2014 08:46   Dg Chest Port 1 View  05/11/2014   CLINICAL DATA:  Hypoxia  EXAM: PORTABLE CHEST - 1 VIEW  COMPARISON:  Study obtained earlier in the day  FINDINGS: Endotracheal tube tip is 8.4 cm above the carina. Nasogastric tube tip and side port are in the stomach. Central catheter tip is in the superior vena cava. No pneumothorax. There is interstitial edema, more on the right than on the  left. There is cardiomegaly with pulmonary venous hypertension. There is consolidation in both lower lobes, more on the left than on the right. There are small effusions bilaterally.  IMPRESSION: Tube and catheter positions as described without pneumothorax. Congestive heart failure. Consolidation in both lower lobes probably represent superimposed pneumonia, although there may be some alveolar edema in these areas as well.   Electronically Signed   By: Lowella Grip M.D.   On: 05/11/2014 18:33   Dg Chest Port 1 View  05/11/2014   CLINICAL DATA:  Respiratory failure with hypoxia  EXAM: PORTABLE CHEST - 1 VIEW  COMPARISON:  05/10/2014  FINDINGS: Endotracheal tube tip projects at the clavicular heads. More proximal appearance is likely related to mild retraction and apical lordotic positioning. Orogastric tube tip in the upper stomach. Stable positioning of left IJ catheter.  Unchanged marked cardiopericardial enlargement. A pericardial effusion may be present, and was seen 11/08/2012.  Increasing interstitial and airspace opacity which is asymmetric to the right. The retrocardiac lung is largely obscured by the marked cardiomegaly, but there are air bronchograms visible. Bilateral small pleural effusions. No pneumothorax.  IMPRESSION: 1. No significant change in the positioning of tubes and central line. 2. Worsening pulmonary edema with pleural effusions. 3. Basilar atelectasis or pneumonia.   Electronically Signed   By: Jorje Guild M.D.   On: 05/11/2014 06:41   Dg Abd Portable 1v  05/11/2014   CLINICAL DATA:  Nasogastric tube placement  EXAM: PORTABLE ABDOMEN - 1 VIEW  COMPARISON:  February 16, 2014  FINDINGS: Nasogastric tube tip and side port are in the stomach. Bowel gas pattern is unremarkable. There is moderate stool in the colon. No obstruction or free air is appreciable on this supine examination.  IMPRESSION: Nasogastric tube tip and side port in stomach. Overall bowel gas pattern  unremarkable.   Electronically Signed   By: Lowella Grip M.D.   On: 05/11/2014 18:35     Assessment/Plan: S/P Procedure(s) (LRB): SUBXYPHOID PERICARDIAL WINDOW (N/A) TRANSESOPHAGEAL ECHOCARDIOGRAM (TEE) (N/A) Stable from surgical standpoint. CT output 266 ml/past 24 hours.  Will continue CT for now until drainage decreases. Medical issues per Pulm/CCM.  COLLINS,GINA H 05/12/2014 9:28 AM   Patient seen and examined, agree with above

## 2014-05-13 ENCOUNTER — Inpatient Hospital Stay (HOSPITAL_COMMUNITY): Payer: Medicare Other

## 2014-05-13 LAB — COMPREHENSIVE METABOLIC PANEL
ALBUMIN: 2.5 g/dL — AB (ref 3.5–5.2)
ALT: 18 U/L (ref 0–35)
ANION GAP: 11 (ref 5–15)
AST: 12 U/L (ref 0–37)
Alkaline Phosphatase: 56 U/L (ref 39–117)
BILIRUBIN TOTAL: 0.4 mg/dL (ref 0.3–1.2)
BUN: 40 mg/dL — ABNORMAL HIGH (ref 6–23)
CHLORIDE: 102 meq/L (ref 96–112)
CO2: 36 mEq/L — ABNORMAL HIGH (ref 19–32)
CREATININE: 1.45 mg/dL — AB (ref 0.50–1.10)
Calcium: 9 mg/dL (ref 8.4–10.5)
GFR calc Af Amer: 42 mL/min — ABNORMAL LOW (ref >90)
GFR calc non Af Amer: 36 mL/min — ABNORMAL LOW (ref >90)
Glucose, Bld: 124 mg/dL — ABNORMAL HIGH (ref 70–99)
Potassium: 3.7 mEq/L (ref 3.7–5.3)
Sodium: 149 mEq/L — ABNORMAL HIGH (ref 137–147)
Total Protein: 5.8 g/dL — ABNORMAL LOW (ref 6.0–8.3)

## 2014-05-13 LAB — CBC
HCT: 31.7 % — ABNORMAL LOW (ref 36.0–46.0)
Hemoglobin: 9.8 g/dL — ABNORMAL LOW (ref 12.0–15.0)
MCH: 26.7 pg (ref 26.0–34.0)
MCHC: 30.9 g/dL (ref 30.0–36.0)
MCV: 86.4 fL (ref 78.0–100.0)
Platelets: 228 10*3/uL (ref 150–400)
RBC: 3.67 MIL/uL — ABNORMAL LOW (ref 3.87–5.11)
RDW: 16.3 % — AB (ref 11.5–15.5)
WBC: 10.2 10*3/uL (ref 4.0–10.5)

## 2014-05-13 LAB — GLUCOSE, CAPILLARY
GLUCOSE-CAPILLARY: 127 mg/dL — AB (ref 70–99)
GLUCOSE-CAPILLARY: 97 mg/dL (ref 70–99)
Glucose-Capillary: 149 mg/dL — ABNORMAL HIGH (ref 70–99)
Glucose-Capillary: 125 mg/dL — ABNORMAL HIGH (ref 70–99)
Glucose-Capillary: 142 mg/dL — ABNORMAL HIGH (ref 70–99)
Glucose-Capillary: 113 mg/dL — ABNORMAL HIGH (ref 70–99)

## 2014-05-13 LAB — PRO B NATRIURETIC PEPTIDE: PRO B NATRI PEPTIDE: 1907 pg/mL — AB (ref 0–125)

## 2014-05-13 MED ORDER — SERTRALINE HCL 50 MG PO TABS
50.0000 mg | ORAL_TABLET | Freq: Every day | ORAL | Status: DC
  Administered 2014-05-13 – 2014-05-16 (×4): 50 mg via ORAL
  Filled 2014-05-13 (×4): qty 1

## 2014-05-13 MED ORDER — INSULIN ASPART 100 UNIT/ML ~~LOC~~ SOLN: 0.0000 [IU] | Freq: Every day | SUBCUTANEOUS | Status: DC

## 2014-05-13 MED ORDER — THROMBIN 20000 UNITS EX SOLR
CUTANEOUS | Status: AC
  Filled 2014-05-13: qty 20000

## 2014-05-13 MED ORDER — INSULIN ASPART 100 UNIT/ML ~~LOC~~ SOLN
0.0000 [IU] | Freq: Three times a day (TID) | SUBCUTANEOUS | Status: DC
  Administered 2014-05-13: 2 [IU] via SUBCUTANEOUS
  Administered 2014-05-14: 3 [IU] via SUBCUTANEOUS
  Administered 2014-05-15 – 2014-05-16 (×3): 2 [IU] via SUBCUTANEOUS

## 2014-05-13 MED ORDER — POTASSIUM CHLORIDE 2 MEQ/ML IV SOLN
INTRAVENOUS | Status: DC
  Administered 2014-05-13 – 2014-05-15 (×4): via INTRAVENOUS
  Filled 2014-05-13 (×6): qty 1000

## 2014-05-13 MED ORDER — HYDROCOD POLST-CHLORPHEN POLST 10-8 MG/5ML PO LQCR
5.0000 mL | Freq: Two times a day (BID) | ORAL | Status: DC | PRN
  Administered 2014-05-13 – 2014-05-14 (×2): 5 mL via ORAL
  Filled 2014-05-13 (×3): qty 5

## 2014-05-13 MED ORDER — FUROSEMIDE 10 MG/ML IJ SOLN
40.0000 mg | Freq: Four times a day (QID) | INTRAMUSCULAR | Status: AC
  Administered 2014-05-13 (×2): 40 mg via INTRAVENOUS
  Filled 2014-05-13 (×2): qty 4

## 2014-05-13 MED ORDER — PANTOPRAZOLE SODIUM 40 MG PO TBEC
40.0000 mg | DELAYED_RELEASE_TABLET | Freq: Every day | ORAL | Status: DC
  Administered 2014-05-13 – 2014-05-17 (×5): 40 mg via ORAL
  Filled 2014-05-13 (×5): qty 1

## 2014-05-13 MED ORDER — HYDRALAZINE HCL 25 MG PO TABS
25.0000 mg | ORAL_TABLET | Freq: Three times a day (TID) | ORAL | Status: DC
  Administered 2014-05-13 – 2014-05-17 (×13): 25 mg via ORAL
  Filled 2014-05-13 (×12): qty 1

## 2014-05-13 MED ORDER — THROMBIN 20000 UNITS EX KIT
PACK | CUTANEOUS | Status: AC
  Filled 2014-05-13: qty 1

## 2014-05-13 MED ORDER — POTASSIUM CHLORIDE CRYS ER 20 MEQ PO TBCR
40.0000 meq | EXTENDED_RELEASE_TABLET | Freq: Two times a day (BID) | ORAL | Status: AC
  Administered 2014-05-13 (×2): 40 meq via ORAL
  Filled 2014-05-13: qty 2
  Filled 2014-05-13: qty 4

## 2014-05-13 NOTE — Progress Notes (Addendum)
TCTS DAILY ICU PROGRESS NOTE                   [address]            [place] [address]          [phone]   2 Days Post-Op Procedure(s) (LRB): SUBXYPHOID PERICARDIAL WINDOW (N/A) TRANSESOPHAGEAL ECHOCARDIOGRAM (TEE) (N/A)  Total Length of Stay:  LOS: 12 days   Subjective: Feeling better Denies SOB   Objective: Vital signs in last 24 hours: Temp:  [99 F (37.2 C)-99.6 F (37.6 C)] 99.5 F (37.5 C) (11/27 0600) Pulse Rate:  [62-130] 99 (11/27 0600) Cardiac Rhythm:  [-] Normal sinus rhythm (11/27 0600) Resp:  [14-27] 23 (11/27 0600) BP: (85-172)/(55-98) 130/83 mmHg (11/27 0600) SpO2:  [90 %-96 %] 94 % (11/27 0600) Arterial Line BP: (74-183)/(41-84) 150/75 mmHg (11/27 0600) FiO2 (%):  [40 %-50 %] 40 % (11/26 1200)  Filed Weights   05/04/14 0400 05/10/14 0415 05/11/14 0600  Weight: 186 lb 15.9 oz (84.82 kg) 196 lb 6.9 oz (89.1 kg) 197 lb 1.5 oz (89.4 kg)    Weight change:    Hemodynamic parameters for last 24 hours:    Intake/Output from previous day: 11/26 0701 - 11/27 0700 In: 1182 [I.V.:912; NG/GT:120; IV Piggyback:150] Out: 7035 [Urine:6105; Emesis/NG output:700; Chest Tube:230]  Intake/Output this shift:    Current Meds: Scheduled Meds: . acetaminophen  1,000 mg Oral 4 times per day   Or  . acetaminophen (TYLENOL) oral liquid 160 mg/5 mL  1,000 mg Oral 4 times per day  . antiseptic oral rinse  7 mL Mouth Rinse QID  . chlorhexidine  15 mL Mouth Rinse BID  . insulin aspart  0-15 Units Subcutaneous 6 times per day  . ipratropium-albuterol  3 mL Nebulization Q6H  . pantoprazole (PROTONIX) IV  40 mg Intravenous Daily  . pravastatin  40 mg Oral q1800  . sennosides  5 mL Oral QHS   Continuous Infusions: . dextrose 50 mL/hr at 05/12/14 1312   PRN Meds:.albuterol, [DISCONTINUED] bisacodyl **OR** bisacodyl, fentaNYL, hydrALAZINE, ondansetron (ZOFRAN) IV, white petrolatum  General appearance: alert, cooperative, fatigued and no  distress Heart: regular rate and rhythm and no rub Lungs: fair air exchange, dim in bases  Extremities: no edema Wound: ok  Lab Results: CBC: Recent Labs  05/12/14 0535 05/13/14 0500  WBC 10.2 10.2  HGB 8.7* 9.8*  HCT 27.6* 31.7*  PLT 219 228   BMET:  Recent Labs  05/12/14 0535 05/13/14 0500  NA 148* 149*  K 3.9 3.7  CL 107 102  CO2 32 36*  GLUCOSE 104* 124*  BUN 56* 40*  CREATININE 1.72* 1.45*  CALCIUM 8.5 9.0    PT/INR:  Recent Labs  05/10/14 2000  LABPROT 14.5  INR 1.12   Radiology: Dg Chest Port 1 View  05/13/2014   CLINICAL DATA:  Respiratory failure.  EXAM: PORTABLE CHEST - 1 VIEW  COMPARISON:  Yesterday.  FINDINGS: Left jugular catheter tip in the superior vena cava. The endotracheal and nasogastric tubes have been removed. Enlarged cardiac silhouette with improvement. No significant change in left basilar airspace opacity and pleural fluid. Stable right basilar pleural fluid with a mild increase in patchy opacity. Stable prominence of the pulmonary vasculature and interstitial markings. The previously described pericardial drain is unchanged.  IMPRESSION: 1. Mild increase in right basilar pneumonia or patchy atelectasis. 2. Stable dense left basilar atelectasis or pneumonia. 3. Stable changes of congestive heart failure with  bilateral pleural effusions.   Electronically Signed   By: Enrique Sack M.D.   On: 05/13/2014 07:51   Dg Chest Port 1 View  05/12/2014   CLINICAL DATA:  COPD. Pericardial window placed 05/11/2014. Respiratory failure.  EXAM: PORTABLE CHEST - 1 VIEW  COMPARISON:  Single view of the chest 05/11/2014 and 05/10/2014.  FINDINGS: Pericardial drain, left IJ catheter, ET tube and NG tube all remain in place. Marked enlargement of the cardiopericardial silhouette appears unchanged since yesterday's examination. Bilateral pleural effusions and airspace disease, worse on the right, appearing change since yesterday's examination. No pneumothorax is  identified.  IMPRESSION: No marked change in bilateral pleural effusions and right worse than left airspace disease.  Marked enlargement of the cardiopericardial silhouette with a pericardial drain in place.   Electronically Signed   By: Inge Rise M.D.   On: 05/12/2014 08:46   Dg Chest Port 1 View  05/11/2014   CLINICAL DATA:  Hypoxia  EXAM: PORTABLE CHEST - 1 VIEW  COMPARISON:  Study obtained earlier in the day  FINDINGS: Endotracheal tube tip is 8.4 cm above the carina. Nasogastric tube tip and side port are in the stomach. Central catheter tip is in the superior vena cava. No pneumothorax. There is interstitial edema, more on the right than on the left. There is cardiomegaly with pulmonary venous hypertension. There is consolidation in both lower lobes, more on the left than on the right. There are small effusions bilaterally.  IMPRESSION: Tube and catheter positions as described without pneumothorax. Congestive heart failure. Consolidation in both lower lobes probably represent superimposed pneumonia, although there may be some alveolar edema in these areas as well.   Electronically Signed   By: Lowella Grip M.D.   On: 05/11/2014 18:33   Dg Abd Portable 1v  05/11/2014   CLINICAL DATA:  Nasogastric tube placement  EXAM: PORTABLE ABDOMEN - 1 VIEW  COMPARISON:  February 16, 2014  FINDINGS: Nasogastric tube tip and side port are in the stomach. Bowel gas pattern is unremarkable. There is moderate stool in the colon. No obstruction or free air is appreciable on this supine examination.  IMPRESSION: Nasogastric tube tip and side port in stomach. Overall bowel gas pattern unremarkable.   Electronically Signed   By: Lowella Grip M.D.   On: 05/11/2014 18:35   Chest tube - 230 cc recorded   Assessment/Plan: S/P Procedure(s) (LRB): SUBXYPHOID PERICARDIAL WINDOW (N/A) TRANSESOPHAGEAL ECHOCARDIOGRAM (TEE) (N/A)  1 Improving, cont CT for now 2 cont aggressive medical management per primary  service     GOLD,WAYNE E 05/13/2014 7:56 AM Patient seen and examined, agree with above Extubated, looks good Will keep drain one more day

## 2014-05-13 NOTE — Progress Notes (Signed)
Md on called notified of pt having coughing spells. Pt having sputum with blood in it also. CT remains in place & is draining to suction. Lungs sounds are stable. Pt also diaphoretic with oral temp of 99.3. Bp 104/68, hr 120, 16, 88% on 5L [place]. Pt's O2 sats back up to 95% on 5L [place].  New orders for tussionex bid. Hoover [place], RN

## 2014-05-13 NOTE — Progress Notes (Signed)
Utilization review completed.  

## 2014-05-13 NOTE — Progress Notes (Signed)
PULMONARY / CRITICAL CARE MEDICINE   Name: [name] MRN: [id number] DOB: [birth year]    ADMISSION DATE:  05/01/2014 CONSULTATION DATE:  05/13/2014  REFERRING MD :  TRH  CHIEF COMPLAINT:  Respiratory Failure  INITIAL PRESENTATION:  68 y/o female presented with LLE cellulitis.  Developed anxiety/tachypnea in hospital tx with ativan >> developed encephalopathy 2nd to hypercapnia requiring intubation.  PCCM assumed care in ICU.  She had recent admit for AECOPD (FEV1 0.85, FEV1% 47 from 2012).  She is followed by Dr. 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  STUDIES:  11/15  L foot XR >> soft tissue swelling, no osseous abnormality 11/17  LE Duplex >> neg for DVT 11/18  Echo >> EF 55 to 60%, mild LVH, grade 1 diastolic dysfx, PAS 44 mmHg, mod/large pericardial effusion >> no tamponade  SIGNIFICANT EVENTS: 11/15  admitted with cellulitis of LLE 11/18  transferred to ICU, intubated 11/19  Cardiology consulted for pericardial effusion 11/20  Extubated to [place] O2 11/23 CVTS consult - plan pericardial window 11/24 Re-intubated 11/24 Transferred to [facility] from [facility] 11/25 Pericardial window placed 11/26 Extubated. Diuresis for edema pattern on CXR. Empiric abx DC'd 11/27 Tolerating extubation well. Transfer to SDU ordered  INTERVAL:   Has tolerated extubation. No distress. No new complaints    VITAL SIGNS: Temp:  [98.3 F (36.8 C)-99.6 F (37.6 C)] 99.3 F (37.4 C) (11/27 1300) Pulse Rate:  [87-119] 102 (11/27 1300) Resp:  [21-27] 25 (11/27 1300) BP: (122-172)/(66-106) 122/76 mmHg (11/27 1300) SpO2:  [90 %-94 %] 91 % (11/27 1300) Arterial Line BP: (144-175)/(64-84) 150/75 mmHg (11/27 0600)   INTAKE / OUTPUT: Intake/Output      11/26 0701 - 11/27 0700 11/27 0701 - 11/28 0700   I.V. (mL/kg) 912 (10.2) 253.3 (2.8)   NG/GT 120    IV Piggyback 150    Total Intake(mL/kg) 1182 (13.2) 253.3 (2.8)   Urine (mL/kg/hr) 6105 (2.8) 1300 (2)   Emesis/NG output 700  (0.3)    Chest Tube 230 (0.1) 20 (0)   Total Output 7035 1320   Net -5853 -1066.7          PHYSICAL EXAMINATION: General: NAD Neuro: No focal deficits HEENT: WNL Cardiovascular: Reg, no M Lungs: no wheezes, dependent crackles Abdomen: soft, non-distended Ext: wrarm, no edema  LABS: I have reviewed all of today's lab results. Relevant abnormalities are discussed in the A/P section  CXR: [place] CM, edema pattern  ASSESSMENT / PLAN:  PULMONARY ETT 11/18 > 11/20, 11/24 >> 11/26 A: Acute on chronic hypercarbic and hypoxic respiratory failure AECOPD, resolved - no active wheezing Pulm edema P:   Cont supp O2 Cont nebs Cont oximetry  CARDIOVASCULAR CVL L IJ 11/18 >>> 11/21  L IJ CVL 11/24 >> 11/27 L radial A-line 11/24 >> 11/27 A:  Shock septic vs cardiogenic, resolved Concern for tamponade S/p pericardial window 11/25 Hx of HTN, HLD. P:  Monitor rhythm and BP PRN hydralazine to maintain SBP < 170 mmHg Pericardial tube to remain per TCTS until 11/28  RENAL A:   AKI (baseline Cr 1.0) - nonoliguric. Cr improving Hypervolemia P:   Monitor BMET intermittently Monitor I/Os Correct electrolytes as indicated Cont diuresis to extent permitted by BP and renal function  GASTROINTESTINAL A:   GERD Chronic PPI therapy P:   SUP: PO PPI Advance diet as tolerated  HEMATOLOGIC A:   Anemia of chronic disease and critical illness. P:  DVT px: SCDs Monitor CBC intermittently  Transfuse per usual ICU guidelines  INFECTIOUS A:   Doubt severe sepsis LLE Cellulitis, completed Abx 11/21 P:   Micro results reviewed Vanc 11/24 >> 11/26 Aztreonam 11/24 >> 11/26   ENDOCRINE A:   DM type II, controlled Hx gout P:  CBG monitoring Cont SSI - changes to ACHS 11/27  NEUROLOGIC A:   Acute encephalopathy, resolved Hx Depression, anxiety, panic attacks Chronic SSRI therapy Post op pain P:   RASS goal: 0 Low dose PRN fentanyl  Resume home dose of  Zoloft   Transfer to SDU. Discussed with Dr [name]. Will remain on PCCM service  Merton Border, MD ; [facility] service Mobile [phone].  After 5:30 PM or weekends, call [phone]

## 2014-05-14 LAB — GLUCOSE, CAPILLARY
GLUCOSE-CAPILLARY: 120 mg/dL — AB (ref 70–99)
GLUCOSE-CAPILLARY: 151 mg/dL — AB (ref 70–99)
Glucose-Capillary: 111 mg/dL — ABNORMAL HIGH (ref 70–99)
Glucose-Capillary: 140 mg/dL — ABNORMAL HIGH (ref 70–99)

## 2014-05-14 MED ORDER — IPRATROPIUM-ALBUTEROL 0.5-2.5 (3) MG/3ML IN SOLN
3.0000 mL | Freq: Four times a day (QID) | RESPIRATORY_TRACT | Status: DC
  Administered 2014-05-14 – 2014-05-15 (×5): 3 mL via RESPIRATORY_TRACT
  Filled 2014-05-14 (×5): qty 3

## 2014-05-14 NOTE — Plan of Care (Signed)
Problem: Phase I Progression Outcomes Goal: VTE prophylaxis Outcome: Completed/Met Date Met:  05/14/14 Pt currently on scds Goal: Tracheostomy by Vent Day 14 Outcome: Not Applicable Date Met:  22/40/01

## 2014-05-14 NOTE — Progress Notes (Signed)
PULMONARY / CRITICAL CARE MEDICINE   Name: [name] MRN: [id number] DOB: 1945-09-23    ADMISSION DATE:  05/01/2014 CONSULTATION DATE:  05/14/2014  REFERRING MD :  TRH  CHIEF COMPLAINT:  Respiratory Failure  INITIAL PRESENTATION:  68 y/o female presented with LLE cellulitis.  Developed anxiety/tachypnea in hospital tx with ativan >> developed encephalopathy 2nd to hypercapnia requiring intubation.  PCCM assumed care in ICU.  She had recent admit for AECOPD (FEV1 0.85, FEV1% 47 from 2012).  She is followed by Dr. 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  STUDIES:  11/15  L foot XR >> soft tissue swelling, no osseous abnormality 11/17  LE Duplex >> neg for DVT 11/18  Echo >> EF 55 to 60%, mild LVH, grade 1 diastolic dysfx, PAS 44 mmHg, mod/large pericardial effusion >> no tamponade  SIGNIFICANT EVENTS: 11/15  admitted with cellulitis of LLE 11/18  transferred to ICU, intubated 11/19  Cardiology consulted for pericardial effusion 11/20  Extubated to [place] O2 11/23 CVTS consult - plan pericardial window 11/24 Re-intubated 11/24 Transferred to [facility] from [facility] 11/25 Pericardial window placed 11/26 Extubated. Diuresis for edema pattern on CXR. Empiric abx DC'd 11/27 Tolerating extubation well. Transfer to SDU ordered  INTERVAL:   Feels a bit better - still a bit confused about the events that led to her pericardial window, "I lost a day" Wants to get OOB  VITAL SIGNS: Temp:  [98.3 F (36.8 C)-99.5 F (37.5 C)] 99.2 F (37.3 C) (11/28 0400) Pulse Rate:  [96-120] 98 (11/28 0651) Resp:  [14-29] 19 (11/28 0651) BP: (104-139)/(65-106) 124/78 mmHg (11/28 0651) SpO2:  [88 %-100 %] 97 % (11/28 0651) Weight:  [83.008 kg (183 lb)-83.326 kg (183 lb 11.2 oz)] 83.326 kg (183 lb 11.2 oz) (11/28 0400)   INTAKE / OUTPUT: Intake/Output      11/27 0701 - 11/28 0700 11/28 0701 - 11/29 0700   P.O. 220    I.V. (mL/kg) 253.3 (3)    NG/GT     IV Piggyback     Total  Intake(mL/kg) 473.3 (5.7)    Urine (mL/kg/hr) 8976 (4.5)    Emesis/NG output     Chest Tube 40 (0)    Total Output 9016     Net -8542.7            PHYSICAL EXAMINATION: General: NAD Neuro: No focal deficits HEENT: WNL Cardiovascular: Reg, no M Lungs: no wheezes, dependent crackles Abdomen: soft, non-distended Ext: wrarm, no edema  LABS: I have reviewed all of today's lab results. Relevant abnormalities are discussed in the A/P section  CXR: [place] CM, edema pattern, pericardial drain in place  ASSESSMENT / PLAN:  PULMONARY ETT 11/18 > 11/20, 11/24 >> 11/26 A: Acute on chronic hypercarbic and hypoxic respiratory failure AECOPD, resolved Pulm edema P:   Cont supp O2 and wean as able Cont nebs Cont oximetry  CARDIOVASCULAR CVL L IJ 11/18 >>> 11/21  L IJ CVL 11/24 >> 11/27 L radial A-line 11/24 >> 11/27 A:  Shock septic vs cardiogenic, resolved Concern for tamponade S/p pericardial window 11/25 Hx of HTN, HLD. P:  Monitor rhythm and BP PRN hydralazine to maintain SBP < 170 mmHg Pericardial tube to remain per TCTS until 11/28  RENAL A:   AKI (baseline Cr 1.0) - nonoliguric. Cr improving Hypervolemia P:   Monitor BMET intermittently Monitor I/Os Correct electrolytes as indicated Cont diuresis to extent permitted by BP and renal function  GASTROINTESTINAL A:   GERD  Chronic PPI therapy P:   SUP: PO PPI Advance diet as tolerated  HEMATOLOGIC A:   Anemia of chronic disease and critical illness. P:  DVT px: SCDs Monitor CBC intermittently Transfuse per usual ICU guidelines  INFECTIOUS A:   Doubt severe sepsis LLE Cellulitis, completed Abx 11/21 P:   Micro results reviewed Vanc 11/24 >> 11/26 Aztreonam 11/24 >> 11/26   ENDOCRINE A:   DM type II, controlled Hx gout P:  CBG monitoring Cont SSI  NEUROLOGIC A:   Acute encephalopathy, resolved Hx Depression, anxiety, panic attacks Chronic SSRI therapy Post op pain P:   RASS goal: 0 Low  dose PRN fentanyl  Continue home dose of Zoloft  Baltazar Apo, MD, PhD 05/14/2014, 7:32 AM [facility] [phone] or if no answer [phone]

## 2014-05-14 NOTE — Progress Notes (Addendum)
       [address]       [place] [address]             [phone]          3 Days Post-Op Procedure(s) (LRB): SUBXYPHOID PERICARDIAL WINDOW (N/A) TRANSESOPHAGEAL ECHOCARDIOGRAM (TEE) (N/A)  Subjective: Complaining of cough this am. Breathing stable.   Objective: Vital signs in last 24 hours: Patient Vitals for the past 24 hrs:  BP Temp Temp src Pulse Resp SpO2 Height Weight  05/14/14 1115 - 99.3 F (37.4 C) Oral - - - - -  05/14/14 0940 - - - - - 93 % - -  05/14/14 0651 124/78 mmHg - - 98 19 97 % - -  05/14/14 0400 119/70 mmHg 99.2 F (37.3 C) Axillary (!) 102 (!) 21 93 % - 183 lb 11.2 oz (83.326 kg)  05/14/14 0159 - - - - - 97 % - -  05/14/14 0000 116/65 mmHg 99 F (37.2 C) Oral 96 (!) 29 95 % - -  05/13/14 2238 127/73 mmHg - - - - - - -  05/13/14 2125 - - - (!) 109 (!) 25 94 % - -  05/13/14 2117 104/68 mmHg 99.3 F (37.4 C) Oral (!) 120 14 (!) 88 % - -  05/13/14 2025 - - - - - 92 % - -  05/13/14 2000 118/85 mmHg 99.3 F (37.4 C) Oral (!) 101 18 100 % - -  05/13/14 1611 137/79 mmHg - - - - - - -  05/13/14 1534 - - - - - 93 % - -  05/13/14 1514 132/72 mmHg 98.8 F (37.1 C) Oral 97 (!) 21 97 % 5\' 2"  (1.575 m) 183 lb (83.008 kg)  05/13/14 1300 122/76 mmHg 99.3 F (37.4 C) - (!) 102 (!) 25 91 % - -  05/13/14 1200 125/84 mmHg 99.1 F (37.3 C) Core 97 (!) 26 92 % - -   Current Weight  05/14/14 183 lb 11.2 oz (83.326 kg)     Intake/Output from previous day: 11/27 0701 - 11/28 0700 In: 473.3 [P.O.:220; I.V.:253.3] Out: 9016 [phone]; Chest Tube:40]    PHYSICAL EXAM:  Heart: RRR Lungs: Diminished BS in bases Wound: Clean and dry    Lab Results: CBC: Recent Labs  05/12/14 0535 05/13/14 0500  WBC 10.2 10.2  HGB 8.7* 9.8*  HCT 27.6* 31.7*  PLT 219 228   BMET:  Recent Labs  05/12/14 0535 05/13/14 0500  NA 148* 149*  K 3.9 3.7  CL 107 102  CO2 32 36*  GLUCOSE 104* 124*  BUN 56* 40*  CREATININE 1.72* 1.45*  CALCIUM  8.5 9.0    PT/INR: No results for input(s): LABPROT, INR in the last 72 hours.  Path: Benign mesothelial tissue, no malignancy Cx: negative  Assessment/Plan: S/P Procedure(s) (LRB): SUBXYPHOID PERICARDIAL WINDOW (N/A) TRANSESOPHAGEAL ECHOCARDIOGRAM (TEE) (N/A) Stable from surgical standpoint. Will d/c pericardial tube since there is minimal drainage. Continue care per pulmonary.   LOS: 13 days    COLLINS,GINA H 05/14/2014  Patient seen and examined, agree with above Dc pericardial drain. Home when OK with pulmonary

## 2014-05-14 NOTE — Progress Notes (Signed)
Patient CT removed as ordered. Patient tolerated well. Dressing applied. Will continue to monitor patient. Shekinah Pitones, Bettina Gavia RN

## 2014-05-14 NOTE — Plan of Care (Signed)
Problem: Consults Goal: Pulmonary Consult Outcome: Completed/Met Date Met:  05/14/14

## 2014-05-15 ENCOUNTER — Inpatient Hospital Stay (HOSPITAL_COMMUNITY): Payer: Medicare Other

## 2014-05-15 LAB — GLUCOSE, CAPILLARY
GLUCOSE-CAPILLARY: 76 mg/dL (ref 70–99)
GLUCOSE-CAPILLARY: 105 mg/dL — AB (ref 70–99)
Glucose-Capillary: 117 mg/dL — ABNORMAL HIGH (ref 70–99)
Glucose-Capillary: 148 mg/dL — ABNORMAL HIGH (ref 70–99)
Glucose-Capillary: 113 mg/dL — ABNORMAL HIGH (ref 70–99)

## 2014-05-15 LAB — BODY FLUID CULTURE: Culture: NO GROWTH

## 2014-05-15 MED ORDER — TIOTROPIUM BROMIDE MONOHYDRATE 18 MCG IN CAPS
18.0000 ug | ORAL_CAPSULE | Freq: Every day | RESPIRATORY_TRACT | Status: DC
  Administered 2014-05-15 – 2014-05-17 (×3): 18 ug via RESPIRATORY_TRACT
  Filled 2014-05-15: qty 5

## 2014-05-15 MED ORDER — BUDESONIDE-FORMOTEROL FUMARATE 80-4.5 MCG/ACT IN AERO
2.0000 | INHALATION_SPRAY | Freq: Two times a day (BID) | RESPIRATORY_TRACT | Status: DC
  Administered 2014-05-15 – 2014-05-17 (×5): 2 via RESPIRATORY_TRACT
  Filled 2014-05-15: qty 6.9

## 2014-05-15 MED ORDER — LEVALBUTEROL HCL 0.63 MG/3ML IN NEBU
0.6300 mg | INHALATION_SOLUTION | Freq: Four times a day (QID) | RESPIRATORY_TRACT | Status: DC
  Administered 2014-05-15 – 2014-05-17 (×8): 0.63 mg via RESPIRATORY_TRACT
  Filled 2014-05-15 (×8): qty 3

## 2014-05-15 MED ORDER — LEVALBUTEROL HCL 0.63 MG/3ML IN NEBU: 0.6300 mg | INHALATION_SOLUTION | RESPIRATORY_TRACT | Status: DC | PRN

## 2014-05-15 NOTE — Progress Notes (Signed)
PULMONARY / CRITICAL CARE MEDICINE   Name: [name] MRN: [id number] DOB: [birth year]    ADMISSION DATE:  05/01/2014 CONSULTATION DATE:  05/15/2014  REFERRING MD :  TRH  CHIEF COMPLAINT:  Respiratory Failure  INITIAL PRESENTATION:  68 y/o female presented with LLE cellulitis.  Developed anxiety/tachypnea in hospital tx with ativan >> developed encephalopathy 2nd to hypercapnia requiring intubation.  PCCM assumed care in ICU.  She had recent admit for AECOPD (FEV1 0.85, FEV1% 47 from 2012).  She is followed by Dr. 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  Per Ramaswamy office not  Severe COPD -O2 depenedent (advair causes epistaxis) chronic respiratory failure- MM phenotype Nov 2012 - Spirometry Nov 2012: Fev1 0.85L/45%, Ratio - CAT score 10 - May 2013 =- COPD based on age, spiormetry and smoking hx and emphysema on CT 2014  Recurrent AECOPD - (baseline eos 200-400 putting her at risk for recurrent aecopd) multiple OVs for AECOPD flares.   Small but increasing pericardial effusion on CT jan and march 2012. Moderate Rt atrial collapse in August 2012 - Dr Acie Fredrickson since Aug 2012 - Improved small pericardial effusion 11/08/2012   STUDIES:  11/15  L foot XR >> soft tissue swelling, no osseous abnormality 11/17  LE Duplex >> neg for DVT 11/18  Echo >> EF 55 to 60%, mild LVH, grade 1 diastolic dysfx, PAS 44 mmHg, mod/large pericardial effusion >> no tamponade  SIGNIFICANT EVENTS: 11/15  admitted with cellulitis of LLE 11/18  transferred to ICU, intubated 11/19  Cardiology consulted for pericardial effusion 11/20  Extubated to [place] O2 11/23 CVTS consult - plan pericardial window 11/24 Re-intubated 11/24 Transferred to [facility] from [facility] 11/25 Pericardial window placed 11/26 Extubated. Diuresis for edema pattern on CXR. Empiric abx DC'd 11/27 Tolerating extubation well. Transfer to SDU ordered  INTERVAL:   Feels a bit better - still  a bit confused about the events that led to her pericardial window, "I lost a day" Wants to get OOB  VITAL SIGNS: Temp:  [98.4 F (36.9 C)-99.6 F (37.6 C)] 98.6 F (37 C) (11/29 0400) Pulse Rate:  [98-108] 103 (11/29 0400) Resp:  [19-27] 27 (11/29 0400) BP: (104-148)/(64-85) 148/80 mmHg (11/29 0400) SpO2:  [91 %-94 %] 94 % (11/29 0400) Weight:  [83.008 kg (183 lb)] 83.008 kg (183 lb) (11/29 0400)   INTAKE / OUTPUT: Intake/Output      11/28 0701 - 11/29 0700 11/29 0701 - 11/30 0700   P.O. 240    I.V. (mL/kg)     Total Intake(mL/kg) 240 (2.9)    Urine (mL/kg/hr) 560 (0.3)    Chest Tube     Total Output 560     Net -320          Stool Occurrence 1 x      PHYSICAL EXAMINATION: General: NAD Neuro: No focal deficits HEENT: WNL Cardiovascular: Reg, no M Lungs: no wheezes, dependent crackles Abdomen: soft, non-distended Ext: wrarm, no edema  LABS: I have reviewed all of today's lab results. Relevant abnormalities are discussed in the A/P section  CXR: [place] CM, edema pattern, pericardial drain in place  ASSESSMENT / PLAN:  PULMONARY ETT 11/18 > 11/20, 11/24 >> 11/26 A: Acute on chronic hypercarbic and hypoxic respiratory failure AECOPD, resolved Pulm edema P:   Cont supp O2 and wean as able Cont nebs>>xopenex Resume home symbicort/spiriva Cont oximetry  CARDIOVASCULAR CVL L IJ 11/18 >>> 11/21  L IJ CVL 11/24 >> 11/27 L radial A-line 11/24 >>  11/27 A:  Shock septic vs cardiogenic, resolved Concern for tamponade S/p pericardial window 11/25 Hx of HTN, HLD. Tachycardia with albuterol P:  Monitor rhythm and BP PRN hydralazine to maintain SBP < 170 mmHg Change to xopenex neb   RENAL A:   AKI (baseline Cr 1.0) - nonoliguric. Cr improving Hypervolemia P:   Monitor BMET intermittently Monitor I/Os Correct electrolytes as indicated Cont diuresis to extent permitted by BP and renal function  GASTROINTESTINAL A:   GERD Chronic PPI therapy P:   SUP: PO  PPI Advance diet as tolerated  HEMATOLOGIC A:   Anemia of chronic disease and critical illness. P:  DVT px: SCDs Monitor CBC intermittently Transfuse per usual ICU guidelines  INFECTIOUS A:   LLE Cellulitis, completed Abx 11/21 P:   Micro results reviewed Vanc 11/24 >> 11/26 Aztreonam 11/24 >> 11/26   ENDOCRINE A:   DM type II, controlled Hx gout P:  CBG monitoring Cont SSI  NEUROLOGIC A:   Acute encephalopathy, resolved Hx Depression, anxiety, panic attacks Chronic SSRI therapy Post op pain P:   Low dose PRN fentanyl  Continue home dose of Zoloft Low dose xanax prn PT OT consults  [name]   05/15/2014, 9:30 AM [facility] [place]  [phone]  Cell  [phone]  If no response or cell goes to voicemail, call beeper [phone]

## 2014-05-15 NOTE — Progress Notes (Signed)
Physical Therapy Evaluation Patient Details Name: [name] MRN: [id number] DOB: July 14, 1945 Today's Date: 05/15/2014   History of Present Illness  68 y/o female presented with LLE cellulitis.  Developed anxiety/tachypnea in hospital tx with ativan >> developed encephalopathy 2nd to hypercapnia requiring intubation 11/18. s/p subxyphoid pericardial window 11/25. Extubated 11/26, and chest tube removed 11/28.  Clinical Impression  Patient is seen following the above admission and procedure, presenting with functional limitations due to the deficits listed below (see PT Problem List). Previously living alone and independent with ADL prior to admission. Patient able to ambulate up to 25 feet today with loss of balance requiring min assist while using a rolling walker. Difficulty maintaining SpO2 >92% requiring 6L supplemental O2 and prolonged seated rest break with pursed lip breathing (see vitals below.) She is typically on 2L supplemental O2 at home. Patient very motivated and eager to return to her apartment however does not have family or friends to assist her at home. If denied by CIR pt will require SNF stay to allow her to reach mod I level before returning home. Patient will benefit from skilled PT to increase their independence and safety with mobility to allow discharge to the venue listed below.      Follow Up Recommendations CIR    Equipment Recommendations  Rolling walker with 5" wheels;3in1 (PT)    Recommendations for Other Services Rehab consult;OT consult     Precautions / Restrictions Precautions Precautions: Fall Restrictions Weight Bearing Restrictions: No      Mobility  Bed Mobility Overal bed mobility: Needs Assistance Bed Mobility: Sit to Supine       Sit to supine: Min guard   General bed mobility comments: Min guard for safety. VC for technique. Did not require physical assist.  Transfers Overall transfer level: Needs assistance Equipment used:  Rolling walker (2 wheeled) Transfers: Sit to/from Stand Sit to Stand: Min guard         General transfer comment: Close guard for safety. VC for hand placement. Slow to rise. Performed from reclining chair, bed and BSC. Very winded.  Ambulation/Gait Ambulation/Gait assistance: Min assist Ambulation Distance (Feet): 25 Feet Assistive device: Rolling walker (2 wheeled) Gait Pattern/deviations: Step-through pattern;Decreased stride length;Staggering left;Staggering right;Trunk flexed   Gait velocity interpretation: Below normal speed for age/gender General Gait Details: Very slow gait. Cues for upright posture and walker placement. Min assist required to prevent falls x2 as pt staggers to Rt and Lt was unable to keep RW on floor. Pt became very dyspneic and fatigued with short distance of ambulation. SpO2 down to 88% on 6L supplemental O2 - returned to 92% after sitting and resting for several minutes with cues for pursed lip breathing.   Stairs            Wheelchair Mobility    Modified Rankin (Stroke Patients Only)       Balance Overall balance assessment: Needs assistance Sitting-balance support: No upper extremity supported;Feet supported Sitting balance-Leahy Scale: Good     Standing balance support: Bilateral upper extremity supported Standing balance-Leahy Scale: Poor Standing balance comment: Able to stand x5 minutes at sink to wash hands, but leaned heavily on counter due to fatigue and SOB.                              Pertinent Vitals/Pain Pain Assessment: 0-10 Pain Score: 0-No pain Pain Intervention(s): Monitored during session  Start of therapy: SpO2 90-92% on 4L  supplemental O2 at rest with HR 106 bpm  Standing: SpO2 dropped to 81% while on 4L - up to 93% on 6L  Ambulating: SpO2 to 88%, very dyspneic, on 6L - HR up to 114 bpm  Sitting: End of therapy SPO2 dropped to 85% on 4L and took considerable amount of time to return to 92% while on 6L.     Home Living Family/patient expects to be discharged to:: Private residence Living Arrangements: Alone Available Help at Discharge: Other (Comment) (no help at home) Type of Home: Apartment Home Access: Stairs to enter Entrance Stairs-Rails: Right;Left Entrance Stairs-Number of Steps: 8 Home Layout: One level Home Equipment: Walker - 4 wheels      Prior Function Level of Independence: Independent with assistive device(s)         Comments: Rollator for outdoor ambulation, oxygen at all times (2L)     Hand Dominance   Dominant Hand: Right    Extremity/Trunk Assessment   Upper Extremity Assessment: Defer to OT evaluation           Lower Extremity Assessment: Generalized weakness         Communication   Communication: No difficulties  Cognition Arousal/Alertness: Awake/alert Behavior During Therapy: WFL for tasks assessed/performed Overall Cognitive Status: Within Functional Limits for tasks assessed                      General Comments General comments (skin integrity, edema, etc.): Pt very dyspneic and fatigues easily with little exertion.    Exercises General Exercises - Lower Extremity Ankle Circles/Pumps: AROM;Both;20 reps;Seated Gluteal Sets: Strengthening;Both;10 reps;Seated Long Arc Quad: Strengthening;Both;10 reps;Seated Heel Slides: Strengthening;Both;10 reps;Supine      Assessment/Plan    PT Assessment Patient needs continued PT services  PT Diagnosis Difficulty walking;Generalized weakness;Abnormality of gait   PT Problem List Decreased strength;Decreased activity tolerance;Decreased balance;Decreased mobility;Decreased knowledge of use of DME;Cardiopulmonary status limiting activity  PT Treatment Interventions DME instruction;Gait training;Functional mobility training;Therapeutic activities;Therapeutic exercise;Balance training;Neuromuscular re-education;Patient/family education;Modalities   PT Goals (Current goals can be found in  the Care Plan section) Acute Rehab PT Goals Patient Stated Goal: Get back to my normal self PT Goal Formulation: With patient Time For Goal Achievement: 05/29/14 Potential to Achieve Goals: Good    Frequency Min 3X/week   Barriers to discharge Decreased caregiver support Lives alone    Co-evaluation               End of Session Equipment Utilized During Treatment: Gait belt;Oxygen (4-6L) Activity Tolerance: Patient limited by fatigue;Other (comment) (Drop in SpO2) Patient left: in bed;with call bell/phone within reach;with bed alarm set Nurse Communication: Mobility status;Other (comment) (Drop in SpO2)         Time: 1530-1616 (-3 minutes non therapeutic while pt toileting) PT Time Calculation (min) (ACUTE ONLY): 46 min   Charges:   PT Evaluation $Initial PT Evaluation Tier I: 1 Procedure PT Treatments $Therapeutic Exercise: 8-22 mins $Therapeutic Activity: 8-22 mins   PT G Codes:         [name], [place]   [name] 05/15/2014, 5:11 PM

## 2014-05-15 NOTE — Progress Notes (Addendum)
       [address]       [place] [address]             [phone]          4 Days Post-Op Procedure(s) (LRB): SUBXYPHOID PERICARDIAL WINDOW (N/A) TRANSESOPHAGEAL ECHOCARDIOGRAM (TEE) (N/A)  Subjective: OOB in chair.  No complaints.   Objective: Vital signs in last 24 hours: Patient Vitals for the past 24 hrs:  BP Temp Temp src Pulse Resp SpO2 Weight  05/15/14 0400 (!) 148/80 mmHg 98.6 F (37 C) - (!) 103 (!) 27 94 % 183 lb (83.008 kg)  05/15/14 0204 139/85 mmHg - - (!) 108 20 91 % -  05/15/14 0003 128/77 mmHg 99.6 F (37.6 C) Oral (!) 106 (!) 24 93 % -  05/14/14 2052 - - - - - 93 % -  05/14/14 2043 128/77 mmHg - - 100 19 94 % -  05/14/14 2000 111/65 mmHg 98.4 F (36.9 C) Oral 98 (!) 27 93 % -  05/14/14 1707 - 99.3 F (37.4 C) Oral - - - -  05/14/14 1643 111/65 mmHg - - 99 (!) 26 93 % -  05/14/14 1252 - - - - - 94 % -  05/14/14 1243 104/64 mmHg - - 99 (!) 26 93 % -  05/14/14 1115 - 99.3 F (37.4 C) Oral - - - -   Current Weight  05/15/14 183 lb (83.008 kg)     Intake/Output from previous day: 11/28 0701 - 11/29 0700 In: 240 [P.O.:240] Out: 560 [Urine:560]    PHYSICAL EXAM:  Heart: RRR, mildly tachy Lungs: Decreased BS in bases Wound: Clean and dry    Lab Results: CBC: Recent Labs  05/13/14 0500  WBC 10.2  HGB 9.8*  HCT 31.7*  PLT 228   BMET:  Recent Labs  05/13/14 0500  NA 149*  K 3.7  CL 102  CO2 36*  GLUCOSE 124*  BUN 40*  CREATININE 1.45*  CALCIUM 9.0    PT/INR: No results for input(s): LABPROT, INR in the last 72 hours.  CXR: stable   Assessment/Plan: S/P Procedure(s) (LRB): SUBXYPHOID PERICARDIAL WINDOW (N/A) TRANSESOPHAGEAL ECHOCARDIOGRAM (TEE) (N/A) Stable from surgical standpoint. Continue current care per CCM.   LOS: 14 days    COLLINS,GINA H 05/15/2014  Path showed minimal chronic inflammation of pericardium. Cultures negative

## 2014-05-16 ENCOUNTER — Encounter (HOSPITAL_COMMUNITY): Payer: Self-pay | Admitting: Thoracic Surgery (Cardiothoracic Vascular Surgery)

## 2014-05-16 DIAGNOSIS — J9600 Acute respiratory failure, unspecified whether with hypoxia or hypercapnia: Secondary | ICD-10-CM

## 2014-05-16 LAB — GLUCOSE, CAPILLARY
GLUCOSE-CAPILLARY: 107 mg/dL — AB (ref 70–99)
Glucose-Capillary: 138 mg/dL — ABNORMAL HIGH (ref 70–99)
Glucose-Capillary: 134 mg/dL — ABNORMAL HIGH (ref 70–99)
Glucose-Capillary: 112 mg/dL — ABNORMAL HIGH (ref 70–99)

## 2014-05-16 LAB — CULTURE, BLOOD (ROUTINE X 2)
CULTURE: NO GROWTH
Culture: NO GROWTH

## 2014-05-16 MED ORDER — FUROSEMIDE 20 MG PO TABS
20.0000 mg | ORAL_TABLET | Freq: Once | ORAL | Status: AC
  Administered 2014-05-16: 20 mg via ORAL
  Filled 2014-05-16: qty 1

## 2014-05-16 MED ORDER — ALPRAZOLAM 0.25 MG PO TABS
0.2500 mg | ORAL_TABLET | Freq: Two times a day (BID) | ORAL | Status: DC | PRN
  Administered 2014-05-16: 0.25 mg via ORAL
  Filled 2014-05-16: qty 1

## 2014-05-16 NOTE — Progress Notes (Signed)
PULMONARY / CRITICAL CARE MEDICINE   Name: [name] MRN: [id number] DOB: [birth year]    ADMISSION DATE:  05/01/2014 CONSULTATION DATE:  05/16/2014  REFERRING MD :  TRH  CHIEF COMPLAINT:  Respiratory Failure  INITIAL PRESENTATION:  68 y/o female presented with LLE cellulitis.  Developed anxiety/tachypnea in hospital tx with ativan >> developed encephalopathy 2nd to hypercapnia requiring intubation.  PCCM assumed care in ICU.  She had recent admit for AECOPD (FEV1 0.85, FEV1% 47 from 2012).  She is followed by Dr. 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  Per Ramaswamy office note  Severe COPD -O2 depenedent (advair causes epistaxis) chronic respiratory failure- MM phenotype Nov 2012 - Spirometry Nov 2012: Fev1 0.85L/45%, Ratio - CAT score 10 - May 2013 =- COPD based on age, spiormetry and smoking hx and emphysema on CT 2014  Recurrent AECOPD - (baseline eos 200-400 putting her at risk for recurrent aecopd) multiple OVs for AECOPD flares.   Small but increasing pericardial effusion on CT jan and march 2012. Moderate Rt atrial collapse in August 2012 - Dr Acie Fredrickson since Aug 2012 - Improved small pericardial effusion 11/08/2012   STUDIES:  11/15  L foot XR >> soft tissue swelling, no osseous abnormality 11/17  LE Duplex >> neg for DVT 11/18  Echo >> EF 55 to 60%, mild LVH, grade 1 diastolic dysfx, PAS 44 mmHg, mod/large pericardial effusion >> no tamponade 11/25 2D with increased pericardial effusion.  SIGNIFICANT EVENTS: 11/15  admitted with cellulitis of LLE 11/18  transferred to ICU, intubated 11/19  Cardiology consulted for pericardial effusion 11/20  Extubated to [place] O2 11/23 CVTS consult - plan pericardial window 11/24 Re-intubated 11/24 [facility]red to [facility] 11/25 Pericardial window placed, Dr. [name] of [place] 11/26 Extubated. Diuresis for edema pattern on CXR. Empiric abx DC'd 11/27 Tolerating extubation  well. Transfer to SDU ordered 11/29 Tolerating floor OK. To weak to go home, will pursue SNF.  INTERVAL:   Walking with PT, very weak.  VITAL SIGNS: Temp:  [97.5 F (36.4 C)-99.3 F (37.4 C)] 99.1 F (37.3 C) (11/30 0822) Pulse Rate:  [99-112] 109 (11/30 0822) Resp:  [18-20] 18 (11/30 0510) BP: (114-148)/(64-88) 124/73 mmHg (11/30 0822) SpO2:  [91 %-94 %] 91 % (11/30 0816) Weight:  [182 lb 15.7 oz (83 kg)] 182 lb 15.7 oz (83 kg) (11/30 0510)   INTAKE / OUTPUT: Intake/Output      11/29 0701 - 11/30 0700 11/30 0701 - 12/01 0700   P.O. 240    I.V. (mL/kg) 600 (7.2)    Total Intake(mL/kg) 840 (10.1)    Urine (mL/kg/hr) 900 (0.5)    Total Output 900     Net -60          Urine Occurrence  1 x   Stool Occurrence 2 x 1 x     PHYSICAL EXAMINATION: General: NAD, remains weak Neuro: No focal deficits HEENT: WNL Cardiovascular: Reg, no M Lungs: no wheezes, dependent crackles, Chest and chest tube site dressings intact Abdomen: soft, non-distended Ext: wrarm, no edema  LABS:  Recent Labs Lab 05/11/14 0409 05/12/14 0535 05/13/14 0500  NA 146 148* 149*  K 4.4 3.9 3.7  CL 105 107 102  CO2 32 32 36*  BUN 61* 56* 40*  CREATININE 1.99* 1.72* 1.45*  GLUCOSE 117* 104* 124*    Recent Labs Lab 05/11/14 0409 05/12/14 0535 05/13/14 0500  HGB 7.9* 8.7* 9.8*  HCT 25.2* 27.6* 31.7*  WBC 6.0 10.2  10.2  PLT 198 219 228    Dg Chest Port 1 View  05/15/2014   CLINICAL DATA:  Pericardial effusion  EXAM: PORTABLE CHEST - 1 VIEW  COMPARISON:  05/13/2014 and 05/12/2014  FINDINGS: Cardiomediastinal silhouette is stable. Bilateral small pleural effusion with bilateral basilar atelectasis or infiltrate. Persistent mild interstitial prominence and patchy airspace disease right lung. Asymmetric edema or pneumonia cannot be excluded.  IMPRESSION: Bilateral small pleural effusion with bilateral basilar atelectasis or infiltrate. Persistent mild interstitial prominence and patchy airspace  disease right lung. Asymmetric edema or pneumonia cannot be excluded.   Electronically Signed   By: Lahoma Crocker M.D.   On: 05/15/2014 10:49       ASSESSMENT / PLAN:  PULMONARY ETT 11/18 > 11/20, 11/24 >> 11/26 A: Acute on chronic hypercarbic and hypoxic respiratory failure AECOPD, resolved Pulm edema P:   Cont supp O2 and wean as able Cont nebs>>xopenex Resume home symbicort/spiriva Cont oximetry Neg balance as able  CARDIOVASCULAR CVL L IJ 11/18 >>> 11/21  L IJ CVL 11/24 >> 11/27 L radial A-line 11/24 >> 11/27 A:  Shock septic vs cardiogenic, resolved Concern for tamponade, resolved with window S/p pericardial window 11/25 Hx of HTN, HLD. Tachycardia with albuterol P:  Monitor rhythm and BP PRN hydralazine to maintain SBP < 170 mmHg Change to xopenex neb   RENAL Lab Results  Component Value Date   CREATININE 1.45* 05/13/2014   CREATININE 1.72* 05/12/2014   CREATININE 1.99* 05/11/2014    A:   AKI (baseline Cr 1.0) - nonoliguric. Cr improving Hypervolemia P:   Monitor BMET intermittently Monitor I/Os Correct electrolytes as indicated Cont diuresis to extent permitted by BP and renal function, crt improving, neg balance as goal  GASTROINTESTINAL A:   GERD Chronic PPI therapy P:   SUP: PO PPI Advance diet as tolerated  HEMATOLOGIC A:   Anemia of chronic disease and critical illness. P:  DVT px: SCDs Monitor CBC intermittently Transfuse per usual ICU guidelines  INFECTIOUS A:   LLE Cellulitis, completed Abx 11/21(resolved) P:   Micro results reviewed Vanc 11/24 >> 11/26 Aztreonam 11/24 >> 11/26   ENDOCRINE CBG (last 3)   Recent Labs  05/15/14 1922 05/15/14 2122 05/16/14 0736  GLUCAP 113* 105* 107*     A:   DM type II, controlled Hx gout P:  CBG monitoring Cont SSI  NEUROLOGIC A:   Acute encephalopathy, resolved Hx Depression, anxiety, panic attacks Chronic SSRI therapy Post op pain P:   Low dose PRN hyrocodone Continue  home dose of Zoloft Low dose xanax prn PT OT consults are following  Discussion: 11/29 will pursue SNF placement if OK with CVTS   Richardson Landry Minor ACNP Maryanna Shape PCCM Pager [phone] till 3 pm If no answer page [phone] 05/16/2014, 9:15 AM   STAFF NOTE: I, Merrie Roof, MD FACP have personally reviewed patient's available data, including medical history, events of note, physical examination and test results as part of my evaluation. I have discussed with resident/NP and other care providers such as pharmacist, RN and RRT. In addition, I personally evaluated patient and elicited key findings of: no distress, needs SNF, lasix as able to neg balance, follow crt further , PT active Lavon Paganini. Titus Mould, MD, [place] Pgr: [facility] 05/16/2014 4:30 PM '

## 2014-05-16 NOTE — Clinical Social Work Psychosocial (Signed)
     Clinical Social Work Department BRIEF PSYCHOSOCIAL ASSESSMENT 05/16/2014  Patient:  [name], [name]     Account Number:  [id number]     Admit date:  05/01/2014  Clinical Social Worker:  Adair Laundry  Date/Time:  05/16/2014 11:58 AM  Referred by:  Physician  Date Referred:  05/16/2014 Referred for  SNF Placement   Other Referral:   Interview type:  Patient Other interview type:    PSYCHOSOCIAL DATA Living Status:  ALONE Admitted from facility:   Level of care:   Primary support name:  Kathrene Alu Primary support relationship to patient:  CHILD, ADULT Degree of support available:   Pt has good family support    CURRENT CONCERNS Current Concerns  Post-Acute Placement   Other Concerns:    SOCIAL WORK ASSESSMENT / PLAN CSW made aware that PT/OT recommending CIR but pt has limited support at home and may be more appropriate for SNF. CSW visited pt room and discussed [facility] with pt. Pt aware of recommendations and is agreeable to SNF. Pt informed CSW she has heard of [facility] and this may be her preference. CSW explained SNF referall process. Pt is agreeable to referal being sent to all [facility]. Pt reports that she is understanding of recommendation and in agreement that this is what will be best. Pt hopeful that she will be able to dc somewhere she feels comfortable.   Assessment/plan status:  Psychosocial Support/Ongoing Assessment of Needs Other assessment/ plan:   Information/referral to community resources:   SNF list to be provided with bed offers    PATIENTS/FAMILYS RESPONSE TO PLAN OF CARE: Pt pleasant and coopeartive. Pt in good spirits. Pt is agreeable to SNF at dc.    [place], [place]

## 2014-05-16 NOTE — Progress Notes (Signed)
Physical Therapy Treatment Patient Details Name: [name] MRN: [id number] DOB: [birth year] Today's Date: 05/16/2014    History of Present Illness 68 y/o female presented with LLE cellulitis.  Developed anxiety/tachypnea in hospital tx with ativan >> developed encephalopathy 2nd to hypercapnia requiring intubation 11/18. s/p subxyphoid pericardial window 11/25. Extubated 11/26, and chest tube removed 11/28.    PT Comments    Pt currently with functional limitations due to decreased endurance, decreased mobility, decreased balance and decreased strength. Pt will benefit from skilled PT to increase independence and safety with mobility to allow discharge to CIR. Pt showed improved endurance with ambulation today and had no episodes of loss of balance. Pt recognizes mobility limitations and took a standing rest break when need in order to replenish energy enough to complete walk back to room. Pt able to demonstrate pursed lip breathing technique when sitting in chair after ambulating in order to increase SpO2 levels. Pt's SpO2 was as low as 77% with ambulation and 69% when sitting in chair after ambulation, both while on 6L O2. Pt stated she felt more weak and fatigued than short of breath during this time. Pt required 5 minutes sitting in chair on 6L O2 before SpO2 increased to 90% at end of session.    Follow Up Recommendations  CIR     Equipment Recommendations  Rolling walker with 5" wheels;3in1 (PT)    Recommendations for Other Services Rehab consult;OT consult     Precautions / Restrictions Precautions Precautions: Fall Restrictions Weight Bearing Restrictions: No    Mobility  Bed Mobility Overal bed mobility: Needs Assistance Bed Mobility: Supine to Sit     Supine to sit: Supervision     General bed mobility comments: Pt able to move from supine to sitting EOB without physical assistance from PT or cuing. Pt monitoring for safety.   Transfers Overall transfer  level: Needs assistance Equipment used: Rolling walker (2 wheeled) Transfers: Sit to/from Stand Sit to Stand: Supervision;Min guard         General transfer comment: Pt supervision, min-guard for safety with transfer from sitting to standing. Pt with correct hand placement and sequencing for transfer.   Ambulation/Gait Ambulation/Gait assistance: Min assist Ambulation Distance (Feet): 80 Feet Assistive device: Rolling walker (2 wheeled) Gait Pattern/deviations: Step-through pattern;Decreased stride length;Trunk flexed   Gait velocity interpretation: Below normal speed for age/gender General Gait Details: Pt with slow gait and heavy reliance on RW during ambulation. Pt had difficulty steering RW at times and ambulated very close to the wall. Pt did not have any loss of balance episode. Pt ambulated 40 feet and required standing rest break to catch her breathe where she leaned her forearms onto the walker. Pt then ambulated 40 feet back into room to sit in chair. Pt began hurrying once close to her room because she was so fatigued and wanted to sit down. Pt's SpO2 dropped to 77% during her standing rest break and maintained in the high 70s to low 80s for walk back into room. Pt's SpO2 was as low as 69% when she sat down in chair at end of ambulation while on 6L O2. Took 5 minutes for SpO2 to get to 90% on 6L O2 once resting. Pt educated on pursed lip breathing technique.     Stairs            Wheelchair Mobility    Modified Rankin (Stroke Patients Only)       Balance Overall balance assessment: Needs assistance Sitting-balance support: No  upper extremity supported;Feet supported Sitting balance-Leahy Scale: Good     Standing balance support: Bilateral upper extremity supported;During functional activity Standing balance-Leahy Scale: Poor Standing balance comment: Pt requires use of RW for standing and ambulation due to fatigue.                     Cognition  Arousal/Alertness: Awake/alert Behavior During Therapy: WFL for tasks assessed/performed Overall Cognitive Status: Within Functional Limits for tasks assessed                      Exercises      General Comments General comments (skin integrity, edema, etc.): Pt showed less symptomatic shortness of breath with ambulation but did have SpO2 drop as low as 69% on 6L O2.       Pertinent Vitals/Pain Pain Assessment: No/denies pain  SpO2 at rest on 4L O2 pre-ambulation: 96% SpO2 during ambulation on 6L O2: 77-69% SpO2 directly post-ambulation on 6L O2 sitting in chair: 69% SpO2 after 5 minutes sitting in chair on 6L O2: 90%     Home Living                      Prior Function            PT Goals (current goals can now be found in the care plan section) Acute Rehab PT Goals PT Goal Formulation: With patient Time For Goal Achievement: 05/29/14 Potential to Achieve Goals: Good Progress towards PT goals: Progressing toward goals    Frequency  Min 3X/week    PT Plan      Co-evaluation             End of Session Equipment Utilized During Treatment: Gait belt;Oxygen (6L) Activity Tolerance: Patient tolerated treatment well;Patient limited by fatigue;Other (comment) (Drop in SpO2) Patient left: in chair;with call bell/phone within reach     Time: 1694-5038 PT Time Calculation (min) (ACUTE ONLY): 20 min  Charges:  $Gait Training: 8-22 mins                    G CodesJearld Shines, SPT 05/16/2014, 4:25 PM   Jearld Shines, [place]  Acute Rehabilitation [phone]

## 2014-05-16 NOTE — Progress Notes (Addendum)
Rehab Admissions Coordinator Note:  Patient was screened by Leylani Duley L for appropriateness for an Inpatient Acute Rehab Consult.  At this time, we are recommending inpatient rehab consult.  Addendum: I spoke with [place], social worker about pt's case and Education officer, museum stated that pt has no family/friend support and prefers to go to SNF for further rehab needs.   At this time, we are recommending that SNF be pursued in light of decreased caregiver support and pt's preference for SNF. No need to pursue rehab MD consult at this time.  Gwendola Hornaday L 05/16/2014, 9:42 AM  I can be reached at [phone].

## 2014-05-16 NOTE — Progress Notes (Signed)
       [address]       [place] [address]             [phone]          5 Days Post-Op Procedure(s) (LRB): SUBXYPHOID PERICARDIAL WINDOW (N/A) TRANSESOPHAGEAL ECHOCARDIOGRAM (TEE) (N/A)  Subjective: Feels well, no complaints.   Objective: Vital signs in last 24 hours: Patient Vitals for the past 24 hrs:  BP Temp Temp src Pulse Resp SpO2 Weight  05/16/14 0816 - - - - - 91 % -  05/16/14 0510 (!) 148/81 mmHg 98.4 F (36.9 C) Oral (!) 103 18 94 % 182 lb 15.7 oz (83 kg)  05/16/14 0115 - - - - - 94 % -  05/16/14 0021 124/64 mmHg 98.1 F (36.7 C) Oral 99 20 94 % -  05/15/14 2029 114/77 mmHg 98.2 F (36.8 C) Oral (!) 103 20 93 % -  05/15/14 1700 131/74 mmHg 99.3 F (37.4 C) Oral (!) 104 20 93 % -  05/15/14 1155 118/88 mmHg 97.5 F (36.4 C) Axillary (!) 112 20 94 % -  05/15/14 1119 - - - - - 94 % -   Current Weight  05/16/14 182 lb 15.7 oz (83 kg)     Intake/Output from previous day: 11/29 0701 - 11/30 0700 In: 840 [P.O.:240; I.V.:600] Out: 900 [Urine:900]    PHYSICAL EXAM:  Heart: RRR Lungs: Diminished BS bilaterally Wound: Clean and dry     Lab Results: CBC:No results for input(s): WBC, HGB, HCT, PLT in the last 72 hours. BMET: No results for input(s): NA, K, CL, CO2, GLUCOSE, BUN, CREATININE, CALCIUM in the last 72 hours.  PT/INR: No results for input(s): LABPROT, INR in the last 72 hours.    Assessment/Plan: S/P Procedure(s) (LRB): SUBXYPHOID PERICARDIAL WINDOW (N/A) TRANSESOPHAGEAL ECHOCARDIOGRAM (TEE) (N/A) Doing well from surgical standpoint.  Continue current care per pulmonary.   LOS: 15 days    Tzvi Economou H 05/16/2014

## 2014-05-16 NOTE — Evaluation (Addendum)
Occupational Therapy Evaluation Patient Details Name: [name] MRN: [id number] DOB: 18-Jul-1945 Today's Date: 05/16/2014    History of Present Illness 68 y/o female presented with LLE cellulitis.  Developed anxiety/tachypnea in hospital tx with ativan >> developed encephalopathy 2nd to hypercapnia requiring intubation 11/18. s/p subxyphoid pericardial window 11/25. Extubated 11/26, and chest tube removed 11/28.   Clinical Impression   Pt s/p above. Pt independent with ADLs, PTA. Feel pt will benefit from acute OT to increase independence, strength, and activity tolerance prior to d/c. Recommending CIR for rehab prior to d/c home.    Follow Up Recommendations  CIR    Equipment Recommendations  Tub/shower seat    Recommendations for Other Services       Precautions / Restrictions Precautions Precautions: Fall Restrictions Weight Bearing Restrictions: No      Mobility Bed Mobility               General bed mobility comments: pt sitting EOB  Transfers Overall transfer level: Needs assistance Equipment used: Rolling walker (2 wheeled) Transfers: Sit to/from Stand Sit to Stand: Supervision; Min guard (cues for hand placement)              Balance                                            ADL Overall ADL's : Needs assistance/impaired     Grooming: Wash/dry face;Oral care;Standing;Set up;Supervision/safety   Upper Body Bathing: Min guard;Standing   Lower Body Bathing: Min guard;Sit to/from stand   Upper Body Dressing : Min guard;Standing   Lower Body Dressing: Min guard;Sit to/from stand   Toilet Transfer: Min guard;Ambulation;RW (bed)           Functional mobility during ADLs: Min guard;Rolling walker General ADL Comments: Educated on energy conservation techniques as well as safety (sitting for most of LB dressing). Educated on deep breathing technique. Pt performed ADLs during session and took breaks-leaning on sink.  Pt ambulated some in room. Explained how participating in therapy is beneficial in that it will help increase activity tolerance. Talked about option for shower chair.     Vision                     Perception     Praxis      Pertinent Vitals/Pain Pain Assessment: No/denies pain; O2 on one occasion in high 80's on 4L of O2 but trended up-pt remained on 4L of O2 in session.      Hand Dominance Right   Extremity/Trunk Assessment Upper Extremity Assessment Upper Extremity Assessment: Overall WFL for tasks assessed   Lower Extremity Assessment Lower Extremity Assessment: Defer to PT evaluation       Communication Communication Communication: No difficulties   Cognition Arousal/Alertness: Awake/alert Behavior During Therapy: WFL for tasks assessed/performed Overall Cognitive Status: Within Functional Limits for tasks assessed                     General Comments       Exercises       Shoulder Instructions      Home Living Family/patient expects to be discharged to:: Private residence Living Arrangements: Alone Available Help at Discharge: Other (Comment) (no help at home ) Type of Home: Apartment Home Access: Stairs to enter Entrance Stairs-Number of Steps: 8 Entrance Stairs-Rails: Right;Left Home Layout: One level  Bathroom Shower/Tub: Occupational psychologist: Handicapped height     Home Equipment: Environmental consultant - 4 wheels;Shower seat - built in          Prior Functioning/Environment Level of Independence: Independent with assistive device(s)        Comments: Rollator for outdoor ambulation, oxygen at all times (2L)    OT Diagnosis: Generalized weakness   OT Problem List: Decreased strength;Decreased activity tolerance;Decreased knowledge of use of DME or AE;Decreased knowledge of precautions;Cardiopulmonary status limiting activity   OT Treatment/Interventions: Self-care/ADL training;DME and/or AE instruction;Energy  conservation;Therapeutic activities;Patient/family education;Balance training;Therapeutic exercise    OT Goals(Current goals can be found in the care plan section) Acute Rehab OT Goals Patient Stated Goal: not stated OT Goal Formulation: With patient Time For Goal Achievement: 05/23/14 Potential to Achieve Goals: Good ADL Goals Pt Will Perform Upper Body Bathing: with modified independence;sitting;standing Pt Will Perform Lower Body Bathing: with modified independence;sit to/from stand Pt Will Perform Lower Body Dressing: with modified independence;sit to/from stand Pt Will Transfer to Toilet: with modified independence;ambulating (elevated toilet) Additional ADL Goal #1: Pt will independently verbalize 3/3 energy conservation techniques and utilize during ADLs/functional activities.  OT Frequency: Min 2X/week   Barriers to D/C:            Co-evaluation              End of Session Equipment Utilized During Treatment: Gait belt;Rolling walker;Oxygen  Activity Tolerance: Patient limited by fatigue Patient left: in bed;with call bell/phone within reach   Time: 0141-0301 OT Time Calculation (min): 29 min Charges:  OT General Charges $OT Visit: 1 Procedure OT Evaluation $Initial OT Evaluation Tier I: 1 Procedure OT Treatments $Self Care/Home Management : 8-22 mins G-CodesBenito Mccreedy OTR/L [phone] 05/16/2014, 10:23 AM

## 2014-05-16 NOTE — Clinical Social Work Placement (Addendum)
    Clinical Social Work Department CLINICAL SOCIAL WORK PLACEMENT NOTE 05/16/2014  Patient:  [name], [name]  Account Number:  [id number] Admit date:  05/01/2014  Clinical Social Worker:  Adair Laundry  Date/time:  05/16/2014 12:19 PM  Clinical Social Work is seeking post-discharge placement for this patient at the following level of care:   SKILLED NURSING   (*CSW will update this form in Epic as items are completed)   05/16/2014  Patient/family provided with [employer] Department of Clinical Social Work's list of facilities offering this level of care within the geographic area requested by the patient (or if unable, by the patient's family).  05/16/2014  Patient/family informed of their freedom to choose among providers that offer the needed level of care, that participate in Medicare, Medicaid or managed care program needed by the patient, have an available bed and are willing to accept the patient.  05/16/2014  Patient/family informed of [facility]' ownership interest in [facility], as well as of the fact that they are under no obligation to receive care at this facility.  PASARR submitted to EDS on 05/16/2014 PASARR number received on 05/16/2014  FL2 transmitted to all facilities in geographic area requested by pt/family on  05/16/2014 FL2 transmitted to all facilities within larger geographic area on   Patient informed that his/her managed care company has contracts with or will negotiate with  certain facilities, including the following:     Patient/family informed of bed offers received:  05/16/2014 Patient chooses bed at [facility] Physician recommends and patient chooses bed at    Patient to be transferred [facility]  on  05/17/2014 Patient to be transferred to facility by [facility] Patient and family notified of transfer on 05/17/2014 Name of family member notified:  Pt notified and declined for CSW  to contact family  The following physician request were entered in Epic: Physician Request  Please sign FL2.    Additional CommentsBerton Mount, [place]

## 2014-05-17 DIAGNOSIS — J9601 Acute respiratory failure with hypoxia: Secondary | ICD-10-CM

## 2014-05-17 LAB — BASIC METABOLIC PANEL
Anion gap: 8 (ref 5–15)
BUN: 18 mg/dL (ref 6–23)
CO2: 34 meq/L — AB (ref 19–32)
Calcium: 8.9 mg/dL (ref 8.4–10.5)
Chloride: 102 mEq/L (ref 96–112)
Creatinine, Ser: 1.14 mg/dL — ABNORMAL HIGH (ref 0.50–1.10)
GFR calc Af Amer: 56 mL/min — ABNORMAL LOW (ref >90)
GFR calc non Af Amer: 48 mL/min — ABNORMAL LOW (ref >90)
Glucose, Bld: 99 mg/dL (ref 70–99)
POTASSIUM: 4.5 meq/L (ref 3.7–5.3)
SODIUM: 144 meq/L (ref 137–147)

## 2014-05-17 LAB — ANAEROBIC CULTURE

## 2014-05-17 LAB — CBC
HCT: 30 % — ABNORMAL LOW (ref 36.0–46.0)
HEMOGLOBIN: 9 g/dL — AB (ref 12.0–15.0)
MCH: 26.5 pg (ref 26.0–34.0)
MCHC: 30 g/dL (ref 30.0–36.0)
MCV: 88.5 fL (ref 78.0–100.0)
Platelets: 282 10*3/uL (ref 150–400)
RBC: 3.39 MIL/uL — AB (ref 3.87–5.11)
RDW: 16.5 % — ABNORMAL HIGH (ref 11.5–15.5)
WBC: 7.8 10*3/uL (ref 4.0–10.5)

## 2014-05-17 LAB — MAGNESIUM: MAGNESIUM: 2.2 mg/dL (ref 1.5–2.5)

## 2014-05-17 LAB — GLUCOSE, CAPILLARY
GLUCOSE-CAPILLARY: 98 mg/dL (ref 70–99)
Glucose-Capillary: 123 mg/dL — ABNORMAL HIGH (ref 70–99)

## 2014-05-17 LAB — RHEUMATOID FACTORS, FLUID: CORTISOL #1 (BASE): NEGATIVE

## 2014-05-17 LAB — PHOSPHORUS: PHOSPHORUS: 3.2 mg/dL (ref 2.3–4.6)

## 2014-05-17 MED ORDER — ALPRAZOLAM 0.25 MG PO TABS: 0.2500 mg | ORAL_TABLET | Freq: Two times a day (BID) | ORAL | Status: DC | PRN

## 2014-05-17 NOTE — Progress Notes (Signed)
Suture removed from chest tube site on pt right upper abdominal quadrant. Will continue to monitor.

## 2014-05-17 NOTE — Progress Notes (Signed)
CSW (Clinical Education officer, museum) prepared pt dc packet and placed with shadow chart. CSW arranged non-emergent ambulance transport. Pt, pt nurse, and facility informed. Pt declined for CSW to call family and stated she would notify them herself. CSW signing off.  [place], [place]

## 2014-05-17 NOTE — Progress Notes (Signed)
Report called to [facility] 

## 2014-05-17 NOTE — Progress Notes (Signed)
PULMONARY / CRITICAL CARE MEDICINE   Name: [name] MRN: [id number] DOB: Sep 07, 1945    ADMISSION DATE:  05/01/2014 CONSULTATION DATE:  05/17/2014  REFERRING MD :  TRH  CHIEF COMPLAINT:  Respiratory Failure  INITIAL PRESENTATION:  68 y/o female presented with LLE cellulitis.  Developed anxiety/tachypnea in hospital tx with ativan >> developed encephalopathy 2nd to hypercapnia requiring intubation.  PCCM assumed care in ICU.  She had recent admit for AECOPD (FEV1 0.85, FEV1% 47 from 2012).  She is followed by Dr. 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  Per Ramaswamy office note  Severe COPD -O2 depenedent (advair causes epistaxis) chronic respiratory failure- MM phenotype Nov 2012 - Spirometry Nov 2012: Fev1 0.85L/45%, Ratio - CAT score 10 - May 2013 =- COPD based on age, spiormetry and smoking hx and emphysema on CT 2014  Recurrent AECOPD - (baseline eos 200-400 putting her at risk for recurrent aecopd) multiple OVs for AECOPD flares.   Small but increasing pericardial effusion on CT jan and march 2012. Moderate Rt atrial collapse in August 2012 - Dr Acie Fredrickson since Aug 2012 - Improved small pericardial effusion 11/08/2012   STUDIES:  11/15  L foot XR >> soft tissue swelling, no osseous abnormality 11/17  LE Duplex >> neg for DVT 11/18  Echo >> EF 55 to 60%, mild LVH, grade 1 diastolic dysfx, PAS 44 mmHg, mod/large pericardial effusion >> no tamponade 11/25 2D with increased pericardial effusion.  SIGNIFICANT EVENTS: 11/15  admitted with cellulitis of LLE 11/18  transferred to ICU, intubated 11/19  Cardiology consulted for pericardial effusion 11/20  Extubated to [place] O2 11/23 CVTS consult - plan pericardial window 11/24 Re-intubated 11/24 Transferred to [facility] from [facility] 11/25 Pericardial window placed, Dr. [name] of [place] 11/26 Extubated. Diuresis for edema pattern on CXR. Empiric abx DC'd 11/27 Tolerating extubation  well. Transfer to SDU ordered 11/29 Tolerating floor OK. To weak to go home, will pursue SNF. 12/1 CIR feels SNF best option. Will dc once cvts has made reccomendations.  INTERVAL:   Walking with PT, remains very weak.  VITAL SIGNS: Temp:  [98.1 F (36.7 C)-99.1 F (37.3 C)] 98.5 F (36.9 C) (12/01 0818) Pulse Rate:  [84-113] 108 (12/01 0818) Resp:  [18-22] 18 (12/01 0818) BP: (130-148)/(60-79) 143/77 mmHg (12/01 0818) SpO2:  [90 %-97 %] 92 % (12/01 0933) Weight:  [185 lb 8 oz (84.142 kg)] 185 lb 8 oz (84.142 kg) (12/01 0400)   INTAKE / OUTPUT: Intake/Output      11/30 0701 - 12/01 0700 12/01 0701 - 12/02 0700   P.O. 360 240   I.V. (mL/kg)     Total Intake(mL/kg) 360 (4.3) 240 (2.9)   Urine (mL/kg/hr) 990 (0.5)    Stool 2 (0)    Total Output 992     Net -632 +240        Urine Occurrence 2 x    Stool Occurrence 2 x      PHYSICAL EXAMINATION: General: NAD, remains weak, but improved Neuro: No focal deficits HEENT: WNL Cardiovascular: Reg, no M Lungs: no wheezes, dependent crackles, Chest and chest tube site dressings intact Abdomen: soft, non-distended Ext: wrarm, no edema  LABS:  Recent Labs Lab 05/12/14 0535 05/13/14 0500 05/17/14 0331  NA 148* 149* 144  K 3.9 3.7 4.5  CL 107 102 102  CO2 32 36* 34*  BUN 56* 40* 18  CREATININE 1.72* 1.45* 1.14*  GLUCOSE 104* 124* 99    Recent Labs Lab  05/12/14 0535 05/13/14 0500 05/17/14 0331  HGB 8.7* 9.8* 9.0*  HCT 27.6* 31.7* 30.0*  WBC 10.2 10.2 7.8  PLT 219 228 282    No results found.     ASSESSMENT / PLAN:  PULMONARY ETT 11/18 > 11/20, 11/24 >> 11/26 A: Acute on chronic hypercarbic and hypoxic respiratory failure AECOPD, resolved Pulm edema P:   Cont supp O2 and wean as able Cont nebs>>xopenex Resume home symbicort/spiriva Cont oximetry Neg balance as able  CARDIOVASCULAR CVL L IJ 11/18 >>> 11/21  L IJ CVL 11/24 >> 11/27 L radial A-line 11/24 >> 11/27 A:  Shock septic vs cardiogenic,  resolved Concern for tamponade, resolved with window S/p pericardial window 11/25 Hx of HTN, HLD. Tachycardia with albuterol P:  Monitor rhythm and BP PRN hydralazine to maintain SBP < 170 mmHg Change to xopenex neb   RENAL Lab Results  Component Value Date   CREATININE 1.14* 05/17/2014   CREATININE 1.45* 05/13/2014   CREATININE 1.72* 05/12/2014    A:   AKI (baseline Cr 1.0) - nonoliguric. Cr improving Hypervolemia P:   Monitor BMET intermittently Monitor I/Os Correct electrolytes as indicated Cont diuresis to extent permitted by BP and renal function, crt improving, neg balance as goal  GASTROINTESTINAL A:   GERD Chronic PPI therapy P:   SUP: PO PPI Advance diet as tolerated  HEMATOLOGIC A:   Anemia of chronic disease and critical illness. P:  DVT px: SCDs Monitor CBC intermittently Transfuse per usual ICU guidelines  INFECTIOUS A:   LLE Cellulitis, completed Abx 11/21(resolved) P:   Micro results reviewed Vanc 11/24 >> 11/26 Aztreonam 11/24 >> 11/26   ENDOCRINE CBG (last 3)   Recent Labs  05/16/14 2033 05/17/14 0729 05/17/14 1144  GLUCAP 112* 98 123*     A:   DM type II, controlled Hx gout P:  CBG monitoring Cont SSI  NEUROLOGIC A:   Acute encephalopathy, resolved Hx Depression, anxiety, panic attacks Chronic SSRI therapy Post op pain P:   Low dose PRN hyrocodone Continue home dose of Zoloft Low dose xanax prn PT OT consults are following  Discussion: 11/29 will pursue SNF placement if OK with CVTS. 12/1 Have called cvts for input.   Richardson Landry Minor ACNP Maryanna Shape PCCM Pager [phone] till 3 pm If no answer page [phone] 05/17/2014, 12:02 PM   If ok with CVTS will likely discharge today, otherwise will continue plan as above.  Patient seen and examined, agree with above note.  I dictated the care and orders written for this patient under my direction.  Rush Farmer, MD [phone]

## 2014-05-17 NOTE — Discharge Summary (Signed)
Physician Discharge Summary  Patient ID: [name] MRN: [id number] DOB/AGE: [birth year] 68 y.o.  Admit date: 05/01/2014 Discharge date: 05/17/2014  Problem List Principal Problem:   Cellulitis of left foot Active Problems:   COPD, frequent exacerbations   Pericardial effusion   Chronic respiratory failure with hypoxia   Tachycardia   Acute respiratory failure   Left leg swelling   Shock  HPI: Patient presents for an acute office visit. She complains of progressively worsening shortness of breath, cough, congestion, sleepiness, fatigue, and wheezing. Patient drove herself to the office and on arrival was unable to get out of her car in the parking lot and needed assistance Patient is normally on oxygen at 2 L at home. On arrival to the office today O2 saturation was 71% on 2 L. Her oxygen was increased to 3 L continuous flow with O2 saturation at 90%. Patient was mildly lethargic on arrival. She denies any hemoptysis, chest pain, palpitations, abdominal pain, nausea, vomiting, diarrhea. Has noted that her ankles, a bit more swollen over the last week. She will need hospital admission for further evaluation and treatment options. Hospital Course:    STUDIES:  11/15 L foot XR >> soft tissue swelling, no osseous abnormality 11/17 LE Duplex >> neg for DVT 11/18 Echo >> EF 55 to 60%, mild LVH, grade 1 diastolic dysfx, PAS 44 mmHg, mod/large pericardial effusion >> no tamponade 11/25 2D with increased pericardial effusion.  SIGNIFICANT EVENTS: 11/15 admitted with cellulitis of LLE 11/18 transferred to ICU, intubated 11/19 Cardiology consulted for pericardial effusion 11/20 Extubated to [place] O2 11/23 CVTS consult - plan pericardial window 11/24 Re-intubated 11/24 Transferred to [facility] from [facility] 11/25 Pericardial window placed, Dr. [name] of [place] 11/26 Extubated. Diuresis for edema pattern on CXR. Empiric abx DC'd 11/27 Tolerating extubation well. Transfer to  SDU ordered 11/29 Tolerating floor OK. To weak to go home, will pursue SNF. 12/1 CIR feels SNF best option.    ASSESSMENT / PLAN:  PULMONARY ETT 11/18 > 11/20, 11/24 >> 11/26 A: Acute on chronic hypercarbic and hypoxic respiratory failure AECOPD, resolved Pulm edema P:  Cont supp O2 and wean as able Cont nebs>>xopenex Resume home symbicort/spiriva Cont oximetry Neg balance as able  CARDIOVASCULAR CVL L IJ 11/18 >>> 11/21  L IJ CVL 11/24 >> 11/27 L radial A-line 11/24 >> 11/27 A:  Shock septic vs cardiogenic, resolved Concern for tamponade, resolved with window S/p pericardial window 11/25 Hx of HTN, HLD. Tachycardia with albuterol P:  Monitor rhythm and BP PRN hydralazine to maintain SBP < 170 mmHg Follow up with CVTS as instructed  RENAL  Labs (Brief)    Lab Results  Component Value Date   CREATININE 1.14* 05/17/2014   CREATININE 1.45* 05/13/2014   CREATININE 1.72* 05/12/2014      A:  AKI (baseline Cr 1.0) - nonoliguric. Cr improving Hypervolemia P:  Monitor BMET intermittently Monitor I/Os Correct electrolytes as indicated Cont diuresis to extent permitted by BP and renal function, crt improving, neg balance as goal  GASTROINTESTINAL A:  GERD Chronic PPI therapy P:  SUP: PO PPI Advance diet as tolerated  HEMATOLOGIC A:  Anemia of chronic disease and critical illness. P:  DVT px: SCDs Monitor CBC intermittently   INFECTIOUS A:  LLE Cellulitis, completed Abx 11/21(resolved) P:  Micro results reviewed Vanc 11/24 >> 11/26 Aztreonam 11/24 >> 11/26   ENDOCRINE CBG (last 3)   Recent Labs (last 2 labs)      Recent Labs  05/16/14 2033 05/17/14  3790 05/17/14 1144  GLUCAP 112* 98 123*       A:  DM type II, controlled Hx gout P:  CBG monitoring Cont SSI  NEUROLOGIC A:  Acute encephalopathy, resolved Hx Depression, anxiety, panic attacks Chronic SSRI therapy Post op pain P:   Low dose PRN hyrocodone Continue home dose of Zoloft Low dose xanax prn PT OT consults are following    Labs at discharge Lab Results  Component Value Date   CREATININE 1.14* 05/17/2014   BUN 18 05/17/2014   NA 144 05/17/2014   K 4.5 05/17/2014   CL 102 05/17/2014   CO2 34* 05/17/2014   Lab Results  Component Value Date   WBC 7.8 05/17/2014   HGB 9.0* 05/17/2014   HCT 30.0* 05/17/2014   MCV 88.5 05/17/2014   PLT 282 05/17/2014   Lab Results  Component Value Date   ALT 18 05/13/2014   AST 12 05/13/2014   ALKPHOS 56 05/13/2014   BILITOT 0.4 05/13/2014   Lab Results  Component Value Date   INR 1.12 05/10/2014   INR 0.99 01/02/2013   INR 0.94 08/08/2011    Current radiology studies No results found.  Disposition:  01-Home or Self Care     Medication List    TAKE these medications        albuterol (2.5 MG/3ML) 0.083% nebulizer solution  Commonly known as:  PROVENTIL  Take 2.5 mg by nebulization every 6 (six) hours as needed for shortness of breath.     albuterol 108 (90 BASE) MCG/ACT inhaler  Commonly known as:  PROAIR HFA  Inhale 2 puffs into the lungs every 4 (four) hours as needed for shortness of breath.     ALPRAZolam 0.25 MG tablet  Commonly known as:  XANAX  Take 1 tablet (0.25 mg total) by mouth 2 (two) times daily as needed for anxiety.     budesonide-formoterol 80-4.5 MCG/ACT inhaler  Commonly known as:  SYMBICORT  Inhale 2 puffs into the lungs 2 (two) times daily.     hydrALAZINE 25 MG tablet  Commonly known as:  APRESOLINE  Take 1 tablet (25 mg total) by mouth every 8 (eight) hours.     lovastatin 40 MG tablet  Commonly known as:  MEVACOR  Take 1 tablet (40 mg total) by mouth at bedtime. For hyperlipedemia     MULTIVITAMIN ADULTS 50+ Tabs  Take 1 tablet by mouth daily.     pantoprazole 40 MG tablet  Commonly known as:  PROTONIX  Take 1 tablet (40 mg total) by mouth daily.     promethazine-codeine 6.25-10 MG/5ML syrup   Commonly known as:  PHENERGAN with CODEINE  Take 5 mLs by mouth every 6 (six) hours as needed for cough.     sertraline 50 MG tablet  Commonly known as:  ZOLOFT  Take 1 tablet (50 mg total) by mouth at bedtime.     tiotropium 18 MCG inhalation capsule  Commonly known as:  SPIRIVA  Place 1 capsule (18 mcg total) into inhaler and inhale daily.           Follow-up Information    Follow up with Melrose Nakayama, MD On 05/31/2014.   Specialty:  Cardiothoracic Surgery   Why:  PA/LAT CXR to be taken (at [facility] which is in the same building as Dr. Leonarda Salon office) on 05/31/2014 at 9:45 am;Appointment with Dr. Blase Mess is at 10:45 am   Contact information:   [address] [place] [address]  [phone]       Follow up with Christinia Gully, MD On 05/31/2014.   Specialty:  Pulmonary Disease   Why:  Dr, Melvyn Novas at 2:00 pm   Contact information:   [address] [place] [address] [phone]        Discharged Condition: fair  Time spent on discharge greater than 40 minutes.  Vital signs at Discharge. Temp:  [98.1 F (36.7 C)-99.1 F (37.3 C)] 98.5 F (36.9 C) (12/01 0818) Pulse Rate:  [84-113] 108 (12/01 0818) Resp:  [18-22] 18 (12/01 0818) BP: (130-143)/(60-77) 143/77 mmHg (12/01 0818) SpO2:  [90 %-97 %] 92 % (12/01 0933) Weight:  [185 lb 8 oz (84.142 kg)] 185 lb 8 oz (84.142 kg) (12/01 0400) Office follow up Special Information or instructions. Follow up with Dr. Melvyn Novas as instructed. Signed: Richardson Landry Minor ACNP Maryanna Shape PCCM Pager [phone] till 3 pm If no answer page [phone] 05/17/2014, 12:18 PM  Patient seen and examined, agree with above note.  I dictated the care and orders written for this patient under my direction.  Rush Farmer, MD [phone]

## 2014-05-17 NOTE — Discharge Instructions (Signed)
Follow up with  Dr. [name] and Dr. Melvyn Novas as instructed. Chest wounds may use light dressing as desired.

## 2014-05-18 ENCOUNTER — Encounter: Payer: Self-pay | Admitting: Adult Health

## 2014-05-18 ENCOUNTER — Non-Acute Institutional Stay (SKILLED_NURSING_FACILITY): Payer: Medicare Other | Admitting: Adult Health

## 2014-05-18 DIAGNOSIS — I319 Disease of pericardium, unspecified: Secondary | ICD-10-CM

## 2014-05-18 DIAGNOSIS — I3139 Other pericardial effusion (noninflammatory): Secondary | ICD-10-CM

## 2014-05-18 DIAGNOSIS — I10 Essential (primary) hypertension: Secondary | ICD-10-CM

## 2014-05-18 DIAGNOSIS — F411 Generalized anxiety disorder: Secondary | ICD-10-CM

## 2014-05-18 DIAGNOSIS — L03116 Cellulitis of left lower limb: Secondary | ICD-10-CM

## 2014-05-18 DIAGNOSIS — J449 Chronic obstructive pulmonary disease, unspecified: Secondary | ICD-10-CM

## 2014-05-18 DIAGNOSIS — I313 Pericardial effusion (noninflammatory): Secondary | ICD-10-CM

## 2014-05-18 DIAGNOSIS — E119 Type 2 diabetes mellitus without complications: Secondary | ICD-10-CM

## 2014-05-18 DIAGNOSIS — E782 Mixed hyperlipidemia: Secondary | ICD-10-CM

## 2014-05-18 DIAGNOSIS — K219 Gastro-esophageal reflux disease without esophagitis: Secondary | ICD-10-CM

## 2014-05-18 DIAGNOSIS — J441 Chronic obstructive pulmonary disease with (acute) exacerbation: Secondary | ICD-10-CM

## 2014-05-18 NOTE — Progress Notes (Signed)
Patient ID: [name], female   DOB: [birth year], 68 y.o.   MRN: [id number]  [name] living     Allergies  Allergen Reactions  . Lisinopril Swelling    Angioedema   . Advair Diskus [Fluticasone-Salmeterol] Other (See Comments)    Nose bleeds.   . Penicillins     hives  . Levaquin [Levofloxacin] Other (See Comments)       Chief Complaint  Patient presents with  . Hospitalization Follow-up    HPI:  She has been hospitalized for left lower extremity cellulitis, pericardial effusion status post pericardial window, copd with frequent exacerbations. She has completed her abt for her cellulitis which has resolved. She is here for short term rehab with her goal to return back home.    Past Medical History  Diagnosis Date  . Asthma   . COPD (chronic obstructive pulmonary disease)   . Hypertension   . Diabetes mellitus 09/13/2010    glucophage  . Gout   . Oxygen dependent     pt uses O2   . Pericardial effusion 08/2011    small residual on echo  . Hyperlipidemia   . Type II or unspecified type diabetes mellitus with neurological manifestations, uncontrolled   . Personal history of noncompliance with medical treatment, presenting hazards to health   . Unspecified disease of pericardium   . Hemorrhage of rectum and anus   . Panic disorder without agoraphobia   . Depressive disorder, not elsewhere classified   . Edema   . Anxiety state, unspecified   . Unspecified hereditary and idiopathic peripheral neuropathy   . Type II or unspecified type diabetes mellitus without mention of complication, not stated as uncontrolled   . Other and unspecified hyperlipidemia   . Obesity, unspecified   . Unspecified tinnitus   . Unspecified essential hypertension   . Pneumonia, organism unspecified   . Chronic obstructive asthma, unspecified   . Acute and chronic respiratory failure   . Urinary frequency   . Urgency of urination   . Anemia due to chronic blood loss 01/04/2013     Past Surgical History  Procedure Laterality Date  . Breast biopsy      both breasts  . Total abdominal hysterectomy    . Esophagogastroduodenoscopy N/A 01/03/2013    Procedure: ESOPHAGOGASTRODUODENOSCOPY (EGD);  Surgeon: Inda Castle, MD;  Location: [facility];  Service: Endoscopy;  Laterality: N/A;  . Colonoscopy N/A 01/05/2013    Procedure: COLONOSCOPY;  Surgeon: Gatha Mayer, MD;  Location: [facility];  Service: Endoscopy;  Laterality: N/A;  . Subxyphoid pericardial window N/A 05/11/2014    Procedure: SUBXYPHOID PERICARDIAL WINDOW;  Surgeon: Melrose Nakayama, MD;  Location: [facility];  Service: Thoracic;  Laterality: N/A;  . Tee without cardioversion N/A 05/11/2014    Procedure: TRANSESOPHAGEAL ECHOCARDIOGRAM (TEE);  Surgeon: Melrose Nakayama, MD;  Location: [facility];  Service: Thoracic;  Laterality: N/A;    History   Social History  . Marital Status: Single    Spouse Name: N/A    Number of Children: 2  . Years of Education: N/A   Occupational History  . retired    Social History Main Topics  . Smoking status: Former Smoker -- 1.00 packs/day for 30 years    Types: Cigarettes    Quit date: 06/17/2004  . Smokeless tobacco: Never Used  . Alcohol Use: 1.0 oz/week    2 drink(s) per week     Comment: none iin a month  . Drug Use: No  Comment: past marijuana use in 1980's.  [name] Sexual Activity: No   Other Topics Concern  . Not on file   Social History Narrative   Lives alone. Kids in [place] but not much contact. One first cousin and 10 second cousins in [place] but no contact. Moved from [place] to [place] in Dec 2011    Family History  Problem Relation Age of Onset  . COPD Mother   . COPD Sister   . Hypertension Sister   . Cancer Father   . Asthma Father   . Diabetes Other     3 maternal aunts  . Prostate cancer Paternal Uncle       VITAL SIGNS BP 112/66 mmHg  Pulse 104  Resp 20  Ht 5\' 2"  (1.575 m)  Wt 177 lb (80.287 kg)   BMI 32.37 kg/m2  SpO2 90%   Outpatient Encounter Prescriptions as of 05/18/2014  Medication Sig  . ALPRAZolam (XANAX) 0.25 MG tablet Take 1 tablet (0.25 mg total) by mouth 2 (two) times daily as needed for anxiety.  . budesonide-formoterol (SYMBICORT) 80-4.5 MCG/ACT inhaler Inhale 2 puffs into the lungs 2 (two) times daily.  . hydrALAZINE (APRESOLINE) 25 MG tablet Take 1 tablet (25 mg total) by mouth every 8 (eight) hours.  . insulin aspart (NOVOLOG) 100 UNIT/ML injection Inject 5 Units into the skin 4 (four) times daily -  before meals and at bedtime. For cbg >=150  . levalbuterol (XOPENEX) 0.63 MG/3ML nebulizer solution Take 0.63 mg by nebulization every 6 (six) hours as needed for wheezing or shortness of breath.  . lovastatin (MEVACOR) 40 MG tablet Take 1 tablet (40 mg total) by mouth at bedtime. For hyperlipedemia  . Multiple Vitamins-Minerals (MULTIVITAMIN ADULTS 50+) TABS Take 1 tablet by mouth daily.  . pantoprazole (PROTONIX) 40 MG tablet Take 1 tablet (40 mg total) by mouth daily.  . promethazine-codeine (PHENERGAN WITH CODEINE) 6.25-10 MG/5ML syrup Take 5 mLs by mouth every 6 (six) hours as needed for cough.  . sertraline (ZOLOFT) 50 MG tablet Take 1 tablet (50 mg total) by mouth at bedtime.  [name] tiotropium (SPIRIVA) 18 MCG inhalation capsule Place 1 capsule (18 mcg total) into inhaler and inhale daily.  . [DISCONTINUED] albuterol (PROAIR HFA) 108 (90 BASE) MCG/ACT inhaler Inhale 2 puffs into the lungs every 4 (four) hours as needed for shortness of breath. (Patient not taking: Reported on 05/18/2014)  . [DISCONTINUED] albuterol (PROVENTIL) (2.5 MG/3ML) 0.083% nebulizer solution Take 2.5 mg by nebulization every 6 (six) hours as needed for shortness of breath.      SIGNIFICANT DIAGNOSTIC EXAMS  05-01-14: left ankle x-ray: Soft tissue swelling, without acute osseous finding.   05-01-14: left foot x-ray; Soft tissue swelling and degenerative change, without acute  osseous abnormality  05-03-14: left lower extremity doppler: No evidence of deep vein or superficial thrombosis involving the left lower extremity and right common femoral vein. - No evidence of Baker&'s cyst on the left.  05-04-14: chest x-ray: Cardiac enlargement with pulmonary vascular congestion, small bilateral pleural effusions, and basilar infiltrates similar to previous study  05-10-14: chest x-ray: 1. Unremarkable positioning of tubes and central line. 2. Pulmonary edema with layering effusions and bibasilar atelectasis. These changes could obscure pneumonia.  05-11-14: TEE: - Left ventricle: Systolic function was normal. The estimated ejection fraction was in the range of 50% to 55%. - Aortic valve: No evidence of vegetation. There was trivial regurgitation. - Mitral valve: No evidence of vegetation. Impressions: - Limited study for Subxyphoid  Pericardial Window: There was a moderate to large pericardial effusion surrounding heart. Left ventricular function did improve after drainage of effusion. The patient tolerated the procedure well. At the end of the procedure the TEE was removed without difficulty. The patient was later taken back to the SICU intubated in stable condition.  05-12-14: chest x-ray: No marked change in bilateral pleural effusions and right worse than left airspace disease. Marked enlargement of the cardiopericardial silhouette with a pericardial drain in place  05-15-14: chest x-ray: Bilateral small pleural effusion with bilateral basilar atelectasis or infiltrate. Persistent mild interstitial prominence and patchy airspace disease right lung. Asymmetric edema or pneumonia cannot be excluded    LABS REVIEWED:   05-10-14: wbc 6.3; hgb 8.6; hct 26.4; mcv 88.9; plt 205; glucose 122; bun 62; creat 2.06; k+4.3; na++145; liver normal albumin 2.6 05-11-14: wbc 6.0; hgb 7.9; hct 25.2; mcv 85.4; plt 198; glucose 117; bun 61; creat 1.99; k+4.4; na++146; phos 2.7; mag  2.5; RA factor: neg 05-13-14: wbc 10.2; hgb 9.8; hct 31.7; mcv 86.4; plt 228; glucose 124; bun 40; creat 1.45; k+3.7; na++149; liver normal albumin 2.5; BNP 1907  05-17-14: wbc 7.8;hgb 9.0; hct 30.0; mcv 88.5; plt 282; glucose 99; bun 18; creat 1.14; k+4.5; na++144; phos 3.2; mag 2.2     Review of Systems  Constitutional: Positive for malaise/fatigue.  Respiratory: Positive for shortness of breath. Negative for cough and sputum production.        Has DOE   Cardiovascular: Negative for chest pain, palpitations and leg swelling.  Gastrointestinal: Negative for heartburn, abdominal pain and constipation.  Musculoskeletal: Negative for myalgias and joint pain.  Skin: Negative.   Psychiatric/Behavioral: Negative for depression. The patient is not nervous/anxious.      Physical Exam  Constitutional: She is oriented to person, place, and time. She appears well-developed and well-nourished.  Overweight; has slight distress noted after activity states this is baseline   Neck: Neck supple. No JVD present. No thyromegaly present.  Cardiovascular: Normal rate, regular rhythm and intact distal pulses.   Respiratory: Effort normal and breath sounds normal. No respiratory distress. She has no wheezes.  GI: Soft. Bowel sounds are normal. She exhibits no distension. There is no tenderness.  Musculoskeletal: She exhibits no edema.  Is able to move all extremities   Neurological: She is alert and oriented to person, place, and time.  Skin: Skin is warm and dry. She is not diaphoretic.  Psychiatric: She has a normal mood and affect.       ASSESSMENT/ PLAN:  1. COPD with frequent exacerbations: she is presently stable is 02 dependent; will continue symbicort 80/4.5 2 puffs twice daily; xopenex 0.63 mg neb treatment every 6 hours as needed; sprirva 18 mcg handihaler daily and will continue to monitor her status.   2. Pericardial effusion is status post pericardial window: she is presently stable; no  signs of distress present. Will continue to monitor her status.   3. Hypertension: is stable will continue apresoline 25 mg every 8 hours will monitor   4. Dyslipidemia: will continue mevacor 40 mg daily   5. GERD: has gastric avm: will continue protonix 40 mg daily   6. Diabetes: is presently stable will continue novolog 5 units prior to meals and hs for cbg >=150 will monitor   7. Anxiety: is stable will continue zoloft 50 mg daily and has xanax 0.25 mg twice daily as needed will monitor   8. Left lower extremity cellulitis: she has completed her abt and  has resolved. Will monitor   9. Physical deconditioning: will continue therapy as directed; she has poor tolerance to activities; he goal is to return home at this time; when she does go home; she will need home health heart and lung programs to help prevent further hospitalizations.   Will check cbc and bmp next week   Time spent with patient 50 minutes.   Ok Edwards NP [facility]  Contact [phone] Monday through Friday 8am- 5pm  After hours call [phone]

## 2014-05-20 ENCOUNTER — Emergency Department (HOSPITAL_COMMUNITY): Payer: Medicare Other

## 2014-05-20 ENCOUNTER — Inpatient Hospital Stay (HOSPITAL_COMMUNITY)
Admission: EM | Admit: 2014-05-20 | Discharge: 2014-05-30 | DRG: 208 | Disposition: A | Discharge status: Skilled Nursing Facility | Payer: Medicare Other | Attending: Emergency Medicine | Admitting: Emergency Medicine

## 2014-05-20 DIAGNOSIS — Z9981 Dependence on supplemental oxygen: Secondary | ICD-10-CM

## 2014-05-20 DIAGNOSIS — M109 Gout, unspecified: Secondary | ICD-10-CM | POA: Diagnosis present

## 2014-05-20 DIAGNOSIS — J9602 Acute respiratory failure with hypercapnia: Secondary | ICD-10-CM

## 2014-05-20 DIAGNOSIS — G9341 Metabolic encephalopathy: Secondary | ICD-10-CM | POA: Diagnosis present

## 2014-05-20 DIAGNOSIS — E785 Hyperlipidemia, unspecified: Secondary | ICD-10-CM | POA: Diagnosis present

## 2014-05-20 DIAGNOSIS — E1165 Type 2 diabetes mellitus with hyperglycemia: Secondary | ICD-10-CM | POA: Diagnosis present

## 2014-05-20 DIAGNOSIS — Z881 Allergy status to other antibiotic agents status: Secondary | ICD-10-CM

## 2014-05-20 DIAGNOSIS — E1129 Type 2 diabetes mellitus with other diabetic kidney complication: Secondary | ICD-10-CM | POA: Diagnosis present

## 2014-05-20 DIAGNOSIS — F329 Major depressive disorder, single episode, unspecified: Secondary | ICD-10-CM | POA: Diagnosis present

## 2014-05-20 DIAGNOSIS — E1149 Type 2 diabetes mellitus with other diabetic neurological complication: Secondary | ICD-10-CM | POA: Diagnosis present

## 2014-05-20 DIAGNOSIS — D631 Anemia in chronic kidney disease: Secondary | ICD-10-CM | POA: Diagnosis present

## 2014-05-20 DIAGNOSIS — J45909 Unspecified asthma, uncomplicated: Secondary | ICD-10-CM | POA: Diagnosis present

## 2014-05-20 DIAGNOSIS — Z880 Allergy status to penicillin: Secondary | ICD-10-CM

## 2014-05-20 DIAGNOSIS — Z66 Do not resuscitate: Secondary | ICD-10-CM | POA: Diagnosis present

## 2014-05-20 DIAGNOSIS — J449 Chronic obstructive pulmonary disease, unspecified: Secondary | ICD-10-CM | POA: Diagnosis present

## 2014-05-20 DIAGNOSIS — E669 Obesity, unspecified: Secondary | ICD-10-CM | POA: Diagnosis present

## 2014-05-20 DIAGNOSIS — R0602 Shortness of breath: Secondary | ICD-10-CM | POA: Diagnosis present

## 2014-05-20 DIAGNOSIS — Z87891 Personal history of nicotine dependence: Secondary | ICD-10-CM

## 2014-05-20 DIAGNOSIS — J9612 Chronic respiratory failure with hypercapnia: Secondary | ICD-10-CM

## 2014-05-20 DIAGNOSIS — N189 Chronic kidney disease, unspecified: Secondary | ICD-10-CM | POA: Diagnosis present

## 2014-05-20 DIAGNOSIS — D500 Iron deficiency anemia secondary to blood loss (chronic): Secondary | ICD-10-CM | POA: Diagnosis present

## 2014-05-20 DIAGNOSIS — J441 Chronic obstructive pulmonary disease with (acute) exacerbation: Secondary | ICD-10-CM | POA: Diagnosis present

## 2014-05-20 DIAGNOSIS — I129 Hypertensive chronic kidney disease with stage 1 through stage 4 chronic kidney disease, or unspecified chronic kidney disease: Secondary | ICD-10-CM | POA: Diagnosis present

## 2014-05-20 DIAGNOSIS — J9620 Acute and chronic respiratory failure, unspecified whether with hypoxia or hypercapnia: Secondary | ICD-10-CM | POA: Diagnosis present

## 2014-05-20 DIAGNOSIS — G609 Hereditary and idiopathic neuropathy, unspecified: Secondary | ICD-10-CM | POA: Diagnosis present

## 2014-05-20 DIAGNOSIS — F419 Anxiety disorder, unspecified: Secondary | ICD-10-CM | POA: Diagnosis present

## 2014-05-20 DIAGNOSIS — E872 Acidosis: Secondary | ICD-10-CM

## 2014-05-20 DIAGNOSIS — Z6829 Body mass index (BMI) 29.0-29.9, adult: Secondary | ICD-10-CM | POA: Diagnosis not present

## 2014-05-20 DIAGNOSIS — Z9114 Patient's other noncompliance with medication regimen: Secondary | ICD-10-CM | POA: Diagnosis present

## 2014-05-20 DIAGNOSIS — J9600 Acute respiratory failure, unspecified whether with hypoxia or hypercapnia: Secondary | ICD-10-CM

## 2014-05-20 DIAGNOSIS — J9690 Respiratory failure, unspecified, unspecified whether with hypoxia or hypercapnia: Secondary | ICD-10-CM | POA: Diagnosis present

## 2014-05-20 DIAGNOSIS — J9622 Acute and chronic respiratory failure with hypercapnia: Secondary | ICD-10-CM

## 2014-05-20 DIAGNOSIS — M7989 Other specified soft tissue disorders: Secondary | ICD-10-CM

## 2014-05-20 LAB — URINALYSIS, ROUTINE W REFLEX MICROSCOPIC
GLUCOSE, UA: NEGATIVE mg/dL
Hgb urine dipstick: NEGATIVE
Ketones, ur: 15 mg/dL — AB
Leukocytes, UA: NEGATIVE
Nitrite: NEGATIVE
Protein, ur: >300 mg/dL — AB
SPECIFIC GRAVITY, URINE: 1.021 (ref 1.005–1.030)
Urobilinogen, UA: 1 mg/dL (ref 0.0–1.0)
pH: 5.5 (ref 5.0–8.0)

## 2014-05-20 LAB — GLUCOSE, CAPILLARY
GLUCOSE-CAPILLARY: 128 mg/dL — AB (ref 70–99)
GLUCOSE-CAPILLARY: 68 mg/dL — AB (ref 70–99)
GLUCOSE-CAPILLARY: 143 mg/dL — AB (ref 70–99)
Glucose-Capillary: 113 mg/dL — ABNORMAL HIGH (ref 70–99)
Glucose-Capillary: 159 mg/dL — ABNORMAL HIGH (ref 70–99)

## 2014-05-20 LAB — BLOOD GAS, ARTERIAL
Acid-Base Excess: 3.5 mmol/L — ABNORMAL HIGH (ref 0.0–2.0)
Bicarbonate: 30.1 mEq/L — ABNORMAL HIGH (ref 20.0–24.0)
Drawn by: 31288
FIO2: 0.6 %
MECHVT: 500 mL
O2 SAT: 87.6 %
PATIENT TEMPERATURE: 98.6
PEEP/CPAP: 5 cmH2O
PO2 ART: 55.6 mmHg — AB (ref 80.0–100.0)
RATE: 22 resp/min
TCO2: 32.2 mmol/L (ref 0–100)
pCO2 arterial: 69.6 mmHg (ref 35.0–45.0)
pH, Arterial: 7.258 — ABNORMAL LOW (ref 7.350–7.450)

## 2014-05-20 LAB — CBG MONITORING, ED: Glucose-Capillary: 158 mg/dL — ABNORMAL HIGH (ref 70–99)

## 2014-05-20 LAB — COMPREHENSIVE METABOLIC PANEL
ALBUMIN: 3 g/dL — AB (ref 3.5–5.2)
ALK PHOS: 94 U/L (ref 39–117)
ALT: 24 U/L (ref 0–35)
ANION GAP: 11 (ref 5–15)
AST: 19 U/L (ref 0–37)
BUN: 17 mg/dL (ref 6–23)
CO2: 30 meq/L (ref 19–32)
CREATININE: 1.35 mg/dL — AB (ref 0.50–1.10)
Calcium: 9.3 mg/dL (ref 8.4–10.5)
Chloride: 100 mEq/L (ref 96–112)
GFR calc Af Amer: 46 mL/min — ABNORMAL LOW (ref >90)
GFR calc non Af Amer: 39 mL/min — ABNORMAL LOW (ref >90)
Glucose, Bld: 185 mg/dL — ABNORMAL HIGH (ref 70–99)
Potassium: 5.5 mEq/L — ABNORMAL HIGH (ref 3.7–5.3)
Sodium: 141 mEq/L (ref 137–147)
Total Bilirubin: 0.2 mg/dL — ABNORMAL LOW (ref 0.3–1.2)
Total Protein: 7.2 g/dL (ref 6.0–8.3)

## 2014-05-20 LAB — URINE MICROSCOPIC-ADD ON

## 2014-05-20 LAB — CBC WITH DIFFERENTIAL/PLATELET
BASOS ABS: 0 10*3/uL (ref 0.0–0.1)
Basophils Relative: 0 % (ref 0–1)
EOS PCT: 0 % (ref 0–5)
Eosinophils Absolute: 0 10*3/uL (ref 0.0–0.7)
HEMATOCRIT: 32.9 % — AB (ref 36.0–46.0)
HEMOGLOBIN: 9.5 g/dL — AB (ref 12.0–15.0)
LYMPHS ABS: 1.1 10*3/uL (ref 0.7–4.0)
LYMPHS PCT: 11 % — AB (ref 12–46)
MCH: 27.1 pg (ref 26.0–34.0)
MCHC: 28.9 g/dL — ABNORMAL LOW (ref 30.0–36.0)
MCV: 94 fL (ref 78.0–100.0)
Monocytes Absolute: 0.6 10*3/uL (ref 0.1–1.0)
Monocytes Relative: 6 % (ref 3–12)
NEUTROS ABS: 8.6 10*3/uL — AB (ref 1.7–7.7)
Neutrophils Relative %: 83 % — ABNORMAL HIGH (ref 43–77)
Platelets: 336 10*3/uL (ref 150–400)
RBC: 3.5 MIL/uL — AB (ref 3.87–5.11)
RDW: 17.3 % — ABNORMAL HIGH (ref 11.5–15.5)
WBC: 10.3 10*3/uL (ref 4.0–10.5)

## 2014-05-20 LAB — POCT I-STAT 3, ART BLOOD GAS (G3+)
ACID-BASE EXCESS: 4 mmol/L — AB (ref 0.0–2.0)
BICARBONATE: 33.6 meq/L — AB (ref 20.0–24.0)
O2 SAT: 100 %
TCO2: 36 mmol/L (ref 0–100)
pCO2 arterial: 84.2 mmHg (ref 35.0–45.0)
pH, Arterial: 7.209 — ABNORMAL LOW (ref 7.350–7.450)
pO2, Arterial: 212 mmHg — ABNORMAL HIGH (ref 80.0–100.0)

## 2014-05-20 LAB — PHOSPHORUS
Phosphorus: 2.2 mg/dL — ABNORMAL LOW (ref 2.3–4.6)
Phosphorus: 3.2 mg/dL (ref 2.3–4.6)

## 2014-05-20 LAB — CREATININE, SERUM
CREATININE: 1.32 mg/dL — AB (ref 0.50–1.10)
GFR, EST AFRICAN AMERICAN: 47 mL/min — AB (ref >90)
GFR, EST NON AFRICAN AMERICAN: 40 mL/min — AB (ref >90)

## 2014-05-20 LAB — CBC
HEMATOCRIT: 31.4 % — AB (ref 36.0–46.0)
HEMOGLOBIN: 9.4 g/dL — AB (ref 12.0–15.0)
MCH: 27.7 pg (ref 26.0–34.0)
MCHC: 29.9 g/dL — ABNORMAL LOW (ref 30.0–36.0)
MCV: 92.6 fL (ref 78.0–100.0)
Platelets: 252 10*3/uL (ref 150–400)
RBC: 3.39 MIL/uL — AB (ref 3.87–5.11)
RDW: 17.2 % — ABNORMAL HIGH (ref 11.5–15.5)
WBC: 7.8 10*3/uL (ref 4.0–10.5)

## 2014-05-20 LAB — PROTIME-INR
INR: 1.02 (ref 0.00–1.49)
PROTHROMBIN TIME: 13.5 s (ref 11.6–15.2)

## 2014-05-20 LAB — MAGNESIUM
MAGNESIUM: 2.3 mg/dL (ref 1.5–2.5)
Magnesium: 2.3 mg/dL (ref 1.5–2.5)

## 2014-05-20 LAB — TRIGLYCERIDES: Triglycerides: 77 mg/dL (ref <150)

## 2014-05-20 LAB — CG4 I-STAT (LACTIC ACID): Lactic Acid, Venous: 0.8 mmol/L (ref 0.5–2.2)

## 2014-05-20 MED ORDER — DEXTROSE 5 % IV SOLN
500.0000 mg | INTRAVENOUS | Status: DC
  Administered 2014-05-20 – 2014-05-24 (×5): 500 mg via INTRAVENOUS
  Filled 2014-05-20 (×4): qty 500

## 2014-05-20 MED ORDER — BUDESONIDE 0.25 MG/2ML IN SUSP
0.5000 mg | Freq: Two times a day (BID) | RESPIRATORY_TRACT | Status: DC
  Administered 2014-05-20: 0.5 mg via RESPIRATORY_TRACT
  Filled 2014-05-20 (×4): qty 4

## 2014-05-20 MED ORDER — METHYLPREDNISOLONE SODIUM SUCC 40 MG IJ SOLR: 40.0000 mg | Freq: Four times a day (QID) | INTRAMUSCULAR | Status: DC

## 2014-05-20 MED ORDER — ARFORMOTEROL TARTRATE 15 MCG/2ML IN NEBU
15.0000 ug | INHALATION_SOLUTION | Freq: Two times a day (BID) | RESPIRATORY_TRACT | Status: DC
  Administered 2014-05-20 – 2014-05-30 (×18): 15 ug via RESPIRATORY_TRACT
  Filled 2014-05-20 (×28): qty 2

## 2014-05-20 MED ORDER — ETOMIDATE 2 MG/ML IV SOLN
INTRAVENOUS | Status: DC | PRN
  Administered 2014-05-20: 20 mg via INTRAVENOUS

## 2014-05-20 MED ORDER — INSULIN ASPART 100 UNIT/ML ~~LOC~~ SOLN
0.0000 [IU] | SUBCUTANEOUS | Status: DC
  Administered 2014-05-20 (×2): 2 [IU] via SUBCUTANEOUS
  Administered 2014-05-20 – 2014-05-21 (×6): 3 [IU] via SUBCUTANEOUS
  Administered 2014-05-21: 2 [IU] via SUBCUTANEOUS
  Administered 2014-05-22 (×6): 3 [IU] via SUBCUTANEOUS
  Administered 2014-05-23 (×2): 2 [IU] via SUBCUTANEOUS
  Administered 2014-05-23 (×2): 3 [IU] via SUBCUTANEOUS
  Administered 2014-05-23: 2 [IU] via SUBCUTANEOUS
  Administered 2014-05-23: 3 [IU] via SUBCUTANEOUS
  Administered 2014-05-24: 2 [IU] via SUBCUTANEOUS
  Administered 2014-05-24 (×2): 3 [IU] via SUBCUTANEOUS
  Administered 2014-05-24: 2 [IU] via SUBCUTANEOUS
  Administered 2014-05-24 (×2): 3 [IU] via SUBCUTANEOUS
  Administered 2014-05-25: 2 [IU] via SUBCUTANEOUS
  Administered 2014-05-25: 3 [IU] via SUBCUTANEOUS
  Administered 2014-05-25 (×2): 2 [IU] via SUBCUTANEOUS
  Administered 2014-05-26: 3 [IU] via SUBCUTANEOUS
  Administered 2014-05-26: 2 [IU] via SUBCUTANEOUS
  Administered 2014-05-26: 3 [IU] via SUBCUTANEOUS
  Administered 2014-05-26: 2 [IU] via SUBCUTANEOUS
  Administered 2014-05-26: 3 [IU] via SUBCUTANEOUS

## 2014-05-20 MED ORDER — IPRATROPIUM-ALBUTEROL 0.5-2.5 (3) MG/3ML IN SOLN: 3.0000 mL | Freq: Four times a day (QID) | RESPIRATORY_TRACT | Status: DC

## 2014-05-20 MED ORDER — ALBUTEROL (5 MG/ML) CONTINUOUS INHALATION SOLN: 10.0000 mg/h | INHALATION_SOLUTION | RESPIRATORY_TRACT | Status: DC

## 2014-05-20 MED ORDER — IPRATROPIUM-ALBUTEROL 0.5-2.5 (3) MG/3ML IN SOLN
3.0000 mL | Freq: Four times a day (QID) | RESPIRATORY_TRACT | Status: DC
  Administered 2014-05-20 – 2014-05-25 (×22): 3 mL via RESPIRATORY_TRACT
  Filled 2014-05-20 (×15): qty 3

## 2014-05-20 MED ORDER — SODIUM CHLORIDE 0.9 % IV SOLN
INTRAVENOUS | Status: DC
  Administered 2014-05-20 (×2): via INTRAVENOUS

## 2014-05-20 MED ORDER — ALBUTEROL SULFATE (2.5 MG/3ML) 0.083% IN NEBU
2.5000 mg | INHALATION_SOLUTION | RESPIRATORY_TRACT | Status: DC | PRN
  Administered 2014-05-26 – 2014-05-27 (×2): 2.5 mg via RESPIRATORY_TRACT
  Filled 2014-05-20 (×2): qty 3

## 2014-05-20 MED ORDER — PANTOPRAZOLE SODIUM 40 MG PO PACK
40.0000 mg | PACK | ORAL | Status: DC
  Administered 2014-05-20 – 2014-05-23 (×4): 40 mg
  Filled 2014-05-20 (×7): qty 20

## 2014-05-20 MED ORDER — CHLORHEXIDINE GLUCONATE 0.12 % MT SOLN
15.0000 mL | Freq: Two times a day (BID) | OROMUCOSAL | Status: DC
  Administered 2014-05-20 – 2014-05-24 (×10): 15 mL via OROMUCOSAL
  Filled 2014-05-20 (×7): qty 15

## 2014-05-20 MED ORDER — FENTANYL CITRATE 0.05 MG/ML IJ SOLN
50.0000 ug | INTRAMUSCULAR | Status: DC | PRN
  Administered 2014-05-20 – 2014-05-23 (×5): 50 ug via INTRAVENOUS
  Filled 2014-05-20 (×3): qty 2

## 2014-05-20 MED ORDER — VITAL HIGH PROTEIN PO LIQD
1000.0000 mL | ORAL | Status: DC
  Filled 2014-05-20 (×2): qty 1000

## 2014-05-20 MED ORDER — PRO-STAT SUGAR FREE PO LIQD
30.0000 mL | Freq: Two times a day (BID) | ORAL | Status: AC
  Administered 2014-05-20 (×2): 30 mL
  Filled 2014-05-20 (×2): qty 30

## 2014-05-20 MED ORDER — SERTRALINE HCL 50 MG PO TABS
50.0000 mg | ORAL_TABLET | Freq: Every day | ORAL | Status: DC
  Administered 2014-05-20 – 2014-05-30 (×11): 50 mg via ORAL
  Filled 2014-05-20 (×12): qty 1

## 2014-05-20 MED ORDER — PROPOFOL 10 MG/ML IV EMUL
0.0000 ug/kg/min | INTRAVENOUS | Status: DC
  Administered 2014-05-20: 5 ug/kg/min via INTRAVENOUS

## 2014-05-20 MED ORDER — SODIUM CHLORIDE 0.9 % IV SOLN: 250.0000 mL | INTRAVENOUS | Status: DC | PRN

## 2014-05-20 MED ORDER — CETYLPYRIDINIUM CHLORIDE 0.05 % MT LIQD
7.0000 mL | Freq: Four times a day (QID) | OROMUCOSAL | Status: DC
  Administered 2014-05-20 – 2014-05-24 (×18): 7 mL via OROMUCOSAL

## 2014-05-20 MED ORDER — PANTOPRAZOLE SODIUM 40 MG IV SOLR: 40.0000 mg | Freq: Every day | INTRAVENOUS | Status: DC

## 2014-05-20 MED ORDER — PROPOFOL 10 MG/ML IV EMUL
INTRAVENOUS | Status: DC | PRN
  Administered 2014-05-20: 15 mg/kg/h via INTRAVENOUS

## 2014-05-20 MED ORDER — FENTANYL CITRATE 0.05 MG/ML IJ SOLN: 50.0000 ug | INTRAMUSCULAR | Status: DC | PRN

## 2014-05-20 MED ORDER — METHYLPREDNISOLONE SODIUM SUCC 40 MG IJ SOLR
40.0000 mg | Freq: Three times a day (TID) | INTRAMUSCULAR | Status: DC
  Administered 2014-05-20 – 2014-05-27 (×21): 40 mg via INTRAVENOUS
  Filled 2014-05-20 (×28): qty 1

## 2014-05-20 MED ORDER — LORAZEPAM 2 MG/ML IJ SOLN
2.0000 mg | INTRAMUSCULAR | Status: DC | PRN
  Administered 2014-05-21: 2 mg via INTRAVENOUS

## 2014-05-20 MED ORDER — VITAL AF 1.2 CAL PO LIQD
1000.0000 mL | ORAL | Status: DC
  Administered 2014-05-20 – 2014-05-24 (×5): 1000 mL
  Filled 2014-05-20 (×8): qty 1000

## 2014-05-20 MED ORDER — SERTRALINE HCL 20 MG/ML PO CONC
50.0000 mg | Freq: Every day | ORAL | Status: DC
  Filled 2014-05-20: qty 2.5

## 2014-05-20 MED ORDER — HEPARIN SODIUM (PORCINE) 5000 UNIT/ML IJ SOLN
5000.0000 [IU] | Freq: Three times a day (TID) | INTRAMUSCULAR | Status: DC
  Administered 2014-05-20 – 2014-05-30 (×31): 5000 [IU] via SUBCUTANEOUS
  Filled 2014-05-20 (×34): qty 1

## 2014-05-20 MED ORDER — ROCURONIUM BROMIDE 50 MG/5ML IV SOLN
INTRAVENOUS | Status: DC | PRN
  Administered 2014-05-20: 100 mg via INTRAVENOUS

## 2014-05-20 MED ORDER — BUDESONIDE 0.5 MG/2ML IN SUSP
0.5000 mg | Freq: Two times a day (BID) | RESPIRATORY_TRACT | Status: DC
  Administered 2014-05-20 – 2014-05-30 (×19): 0.5 mg via RESPIRATORY_TRACT
  Filled 2014-05-20 (×25): qty 2

## 2014-05-20 NOTE — Consult Note (Signed)
PULMONARY / CRITICAL CARE MEDICINE   Name: ALAYCIA EARDLEY MRN: [id number] DOB: June 01, 1946    ADMISSION DATE:  05/20/2014 CONSULTATION DATE:  05/20/2014  REFERRING MD :  EDP  CHIEF COMPLAINT:  Respiratory Failure  INITIAL PRESENTATION:  68 y.o. F from [facility], just discharged from hospital 12/1 after she was hospitalized for roughly 2 weeks for cellulitis of LLE.  During hospitalization, developed respiratory failure requiring intubation.  Workup at the time also revealed cardiomegaly and pericardial effusion for which she had pericardial window placed 11/25 [name]).  While at [facility], during early AM hours of 12/4, developed respiratory distress and became minimally responsive.  She was transported to ED where she requiring intubation for hypoxic respiratory failure.   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  STUDIES:  CXR 12/4 >>> ETT in good position, small b/l pleural effusions L > R, cardiomegaly.  SIGNIFICANT EVENTS: 12/1 - discharged from [facility] to [facility] 12/4 - readmitted for hypoxic respiratory failure   HISTORY OF PRESENT ILLNESS:  Pt is encephalopathic; therefore, this HPI is obtained from chart review. Mrs. Bowne is a 68 y.o. F with severe COPD and multiple fair ups (4L O2 dependent) who was just recently admitted to [facility] (11/15) with LLE cellulitis.  During that admission, she developed AECOPD and required intubation.  Workup at the time also revealed cardiomegaly and pericardial effusion which required pericardial window placement on 11/25.  She was discharged from [facility] to [facility] on 12/1.  During early AM hours of 12/4, she was brought to [facility] ED after staff at SNF found her minimally responsive after an episode of respiratory distress.  SpO2 on EMS arrival was in 50's.  On arrival to ED, she required BVM for poor respiratory effort and subsequently required intubation.  PCCM was consulted for  admission.  Note, she has end stage COPD, 4L O2 dependent, followed by MR as outpatient.  PFT's 2012: FEV1 .85, ratio 47%.   PAST MEDICAL HISTORY :   has a past medical history of Asthma; COPD (chronic obstructive pulmonary disease); Hypertension; Diabetes mellitus (09/13/2010); Gout; Oxygen dependent; Pericardial effusion (08/2011); Hyperlipidemia; Type II or unspecified type diabetes mellitus with neurological manifestations, uncontrolled; Personal history of noncompliance with medical treatment, presenting hazards to health; Unspecified disease of pericardium; Hemorrhage of rectum and anus; Panic disorder without agoraphobia; Depressive disorder, not elsewhere classified; Edema; Anxiety state, unspecified; Unspecified hereditary and idiopathic peripheral neuropathy; Type II or unspecified type diabetes mellitus without mention of complication, not stated as uncontrolled; Other and unspecified hyperlipidemia; Obesity, unspecified; Unspecified tinnitus; Unspecified essential hypertension; Pneumonia, organism unspecified; Chronic obstructive asthma, unspecified; Acute and chronic respiratory failure; Urinary frequency; Urgency of urination; and Anemia due to chronic blood loss (01/04/2013).  has past surgical history that includes Breast biopsy; Total abdominal hysterectomy; Esophagogastroduodenoscopy (N/A, 01/03/2013); Colonoscopy (N/A, 01/05/2013); Subxyphoid pericardial window (N/A, 05/11/2014); and TEE without cardioversion (N/A, 05/11/2014). Prior to Admission medications   Medication Sig Start Date End Date Taking? Authorizing Provider  albuterol (PROAIR HFA) 108 (90 BASE) MCG/ACT inhaler Inhale 2 puffs into the lungs every 4 (four) hours as needed for shortness of breath. 04/27/14  Yes Tammy S Parrett, NP  albuterol (PROVENTIL) (2.5 MG/3ML) 0.083% nebulizer solution Take 2.5 mg by nebulization every 6 (six) hours as needed for shortness of breath.    Yes Historical Provider, MD  budesonide-formoterol  (SYMBICORT) 80-4.5 MCG/ACT inhaler Inhale 2 puffs into the lungs 2 (two)  times daily. 04/05/14  Yes [name] L Reed, DO  hydrALAZINE (APRESOLINE) 25 MG tablet Take 1 tablet (25 mg total) by mouth every 8 (eight) hours. 01/21/14  Yes [name] L Reed, DO  lovastatin (MEVACOR) 40 MG tablet Take 1 tablet (40 mg total) by mouth at bedtime. For hyperlipedemia 10/27/13  Yes [name] L Reed, DO  Multiple Vitamins-Minerals (MULTIVITAMIN ADULTS 50+) TABS Take 1 tablet by mouth daily.   Yes Historical Provider, MD  pantoprazole (PROTONIX) 40 MG tablet Take 1 tablet (40 mg total) by mouth daily. 02/16/14  Yes Jerene Bears, MD  promethazine-codeine (PHENERGAN WITH CODEINE) 6.25-10 MG/5ML syrup Take 5 mLs by mouth every 6 (six) hours as needed for cough. 04/14/14  Yes Deneise Lever, MD  sertraline (ZOLOFT) 50 MG tablet Take 1 tablet (50 mg total) by mouth at bedtime. 01/21/14  Yes [name] L Reed, DO  tiotropium (SPIRIVA) 18 MCG inhalation capsule Place 1 capsule (18 mcg total) into inhaler and inhale daily. 10/22/13  Yes [name] L Reed, DO   Allergies  Allergen Reactions  . Lisinopril Swelling    Angioedema   . Advair Diskus [Fluticasone-Salmeterol] Other (See Comments)    Nose bleeds.   . Penicillins     hives  . Levaquin [Levofloxacin] Other (See Comments)    FAMILY HISTORY:  Family History  Problem Relation Age of Onset  . COPD Mother   . COPD Sister   . Hypertension Sister   . Cancer Father   . Asthma Father   . Diabetes Other     3 maternal aunts  . Prostate cancer Paternal Uncle     SOCIAL HISTORY:  reports that she quit smoking about 9 years ago. Her smoking use included Cigarettes. She has a 30 pack-year smoking history. She has never used smokeless tobacco. She reports that she drinks about 1.0 oz of alcohol per week. She reports that she does not use illicit drugs.  REVIEW OF SYSTEMS:  Unable to complete as pt is encephalopathic.  SUBJECTIVE:   VITAL SIGNS: Temp:  [93.9 F (34.4 C)-94.1  F (34.5 C)] 94.1 F (34.5 C) (12/04 0307) Pulse Rate:  [80-100] 80 (12/04 0300) Resp:  [6-20] 6 (12/04 0300) BP: (97-106)/(55-61) 100/61 mmHg (12/04 0300) SpO2:  [82 %-100 %] 100 % (12/04 0300) FiO2 (%):  [100 %] 100 % (12/04 0241) Weight:  [80.287 kg (177 lb)] 80.287 kg (177 lb) (12/04 0241) HEMODYNAMICS:   VENTILATOR SETTINGS: Vent Mode:  [-] PRVC FiO2 (%):  [100 %] 100 % Set Rate:  [14 bmp] 14 bmp Vt Set:  [500 mL] 500 mL PEEP:  [5 cmH20] 5 cmH20 Plateau Pressure:  [18 cmH20] 18 cmH20 INTAKE / OUTPUT: Intake/Output    None     PHYSICAL EXAMINATION: General: WDWN female, sedated on vent. Neuro: Sedated, opens eyes to painful stimuli. HEENT: [place]/AT. PERRL, sclerae anicteric. Cardiovascular: RRR, no M/R/G.  Pericardial window site healing well. Lungs: Respirations shallow, scattered wheezes, crackles in bases. Abdomen: BS x 4, soft, NT/ND.  Musculoskeletal: No gross deformities, LLE edema > RLE. Skin: Intact, cool, no rashes.  LABS:  CBC  Recent Labs Lab 05/13/14 0500 05/17/14 0331 05/20/14 0227  WBC 10.2 7.8 10.3  HGB 9.8* 9.0* 9.5*  HCT 31.7* 30.0* 32.9*  PLT 228 282 336   Coag's  Recent Labs Lab 05/20/14 0227  INR 1.02     BMET  Recent Labs Lab 05/13/14 0500 05/17/14 0331  NA 149* 144  K 3.7 4.5  CL 102 102  CO2 36* 34*  BUN 40* 18  CREATININE 1.45* 1.14*  GLUCOSE 124* 99   Electrolytes  Recent Labs Lab 05/13/14 0500 05/17/14 0331  CALCIUM 9.0 8.9  MG  --  2.2  PHOS  --  3.2   Sepsis Markers No results for input(s): LATICACIDVEN, PROCALCITON, O2SATVEN in the last 168 hours. ABG No results for input(s): PHART, PCO2ART, PO2ART in the last 168 hours. Liver Enzymes  Recent Labs Lab 05/13/14 0500  AST 12  ALT 18  ALKPHOS 56  BILITOT 0.4  ALBUMIN 2.5*   Cardiac Enzymes  Recent Labs Lab 05/13/14 0500  PROBNP 1907.0*     Glucose  Recent Labs Lab 05/16/14 1151 05/16/14 1629 05/16/14 2033 05/17/14 0729  05/17/14 1144 05/20/14 0243  GLUCAP 138* 134* 112* 98 123* 158*    Imaging Dg Chest Portable 1 View  05/20/2014   CLINICAL DATA:  Acute onset of shortness of breath. Respiratory distress. Current history of diabetes and chronic moderate asthma. Initial encounter.  EXAM: PORTABLE CHEST - 1 VIEW  COMPARISON:  Chest radiograph performed 05/15/2014  FINDINGS: The patient's endotracheal tube is seen ending 5-6 cm above the carina. An enteric tube is noted extending below the diaphragm.  Small bilateral pleural effusions are again seen, left greater than right. Persistent retrocardiac airspace opacification may reflect atelectasis or pneumonia. Interstitial edema might have a similar appearance, given persistent increased interstitial markings. No pneumothorax is seen.  The cardiomediastinal silhouette is enlarged. No acute osseous abnormalities are identified.  IMPRESSION: 1. Endotracheal tube seen ending 5-6 cm above the carina. 2. Small bilateral pleural effusions again seen, left greater than right. Persistent retrocardiac airspace opacification may reflect atelectasis or pneumonia. Given increased interstitial markings, interstitial edema might have a similar appearance. 3. Cardiomegaly noted.   Electronically Signed   By: Garald Balding M.D.   On: 05/20/2014 02:57    ASSESSMENT / PLAN:  PULMONARY OETT 12/4  >>> A: Acute on chronic hypercarbic and hypoxic respiratory failure AECOPD P:   Full vent support. ABG in 1 hour and adjust vent accordingly. VAP bundle. Monitor for PEEPi. SBT in AM if able. Solumedrol 40 mg q8h. Scheduled BDs with brovana / budesonide, Duonebs, and PRN albuterol. Hold outpatient symbicort, xopenex, spiriva. CXR in AM 12/5 if still intubated.  CARDIOVASCULAR A:  Hx of HTN, HLD. Cardiomegaly with hx of pericardial effusion s/p pericardial window 11/25 [name]) P:  Hold outpatient hydralazine. Continue lovastatin per tube.  RENAL A:   CKD P:   NS @  100. BMP in AM 12/5.  GASTROINTESTINAL A:   SUP Nutrition P:   Protonix. NPO. TF if remains NPO > 24 hrs.  HEMATOLOGIC A:   Anemia of chronic disease and critical illness VTE prophylaxis P:  Transfuse per usual ICU guildelines. SCD's / Heparin. CBC in AM 12/5.  INFECTIOUS A:   AECOPD P:   Sputum Cx 12/4 >>> Abx:  Levaquin, start date 12/4, day 1/x.  ENDOCRINE A:   DM II Hx gout P:  CBG's q4hrs. SSI.  NEUROLOGIC A:   Acute metabolic encephalopathy in setting respiratory failure. Hx depression, anxiety, panic attacks. P:   Sedation:  Propofol gtt / Fentanyl PRN. RASS goal: 0 to -1. Daily WUA. Hold outpatient xanax, zoloft.   Family updated:  Son Monroe over phone.  He lives in [place] but is planning on coming down perhaps Friday night after work.  Interdisciplinary Family Meeting v Palliative Care Meeting:  Due by: 12/11.   Montey Hora, PA - C  [facility] Pgr: [phone]  or [phone] 05/20/2014, 3:16 AM

## 2014-05-20 NOTE — ED Provider Notes (Signed)
CSN: [id number]     Arrival date & time 05/20/14  0212 History  This chart was scribed for Ezequiel Essex, MD by Evelene Croon, ED Scribe. This patient was seen in room [facility] and the patient's care was started 2:11 AM.     Chief Complaint  Patient presents with  . Respiratory Distress    LEVEL 5 CAVEAT due to acuity of medical condition    The history is provided by the EMS personnel. No language interpreter was used.   HPI Comments:  [name] is a 68 y.o. female with a h/o asthma and COPD brought in by ambulance, who presents to the Emergency Department for moderate-severe respiratory distress. Per EMS, upon their arrival to the pt O2 sat was in the 50s and pt has been unresponsive since pickup. EMS notes pt is normally on 4L home O2 continously. Recently discharged from the hospital.   Past Medical History  Diagnosis Date  . Asthma   . COPD (chronic obstructive pulmonary disease)   . Hypertension   . Diabetes mellitus 09/13/2010    glucophage  . Gout   . Oxygen dependent     pt uses O2   . Pericardial effusion 08/2011    small residual on echo  . Hyperlipidemia   . Type II or unspecified type diabetes mellitus with neurological manifestations, uncontrolled   . Personal history of noncompliance with medical treatment, presenting hazards to health   . Unspecified disease of pericardium   . Hemorrhage of rectum and anus   . Panic disorder without agoraphobia   . Depressive disorder, not elsewhere classified   . Edema   . Anxiety state, unspecified   . Unspecified hereditary and idiopathic peripheral neuropathy   . Type II or unspecified type diabetes mellitus without mention of complication, not stated as uncontrolled   . Other and unspecified hyperlipidemia   . Obesity, unspecified   . Unspecified tinnitus   . Unspecified essential hypertension   . Pneumonia, organism unspecified   . Chronic obstructive asthma, unspecified   . Acute and chronic  respiratory failure   . Urinary frequency   . Urgency of urination   . Anemia due to chronic blood loss 01/04/2013   Past Surgical History  Procedure Laterality Date  . Breast biopsy      both breasts  . Total abdominal hysterectomy    . Esophagogastroduodenoscopy N/A 01/03/2013    Procedure: ESOPHAGOGASTRODUODENOSCOPY (EGD);  Surgeon: Inda Castle, MD;  Location: [place];  Service: Endoscopy;  Laterality: N/A;  . Colonoscopy N/A 01/05/2013    Procedure: COLONOSCOPY;  Surgeon: Gatha Mayer, MD;  Location: [place];  Service: Endoscopy;  Laterality: N/A;  . Subxyphoid pericardial window N/A 05/11/2014    Procedure: SUBXYPHOID PERICARDIAL WINDOW;  Surgeon: Melrose Nakayama, MD;  Location: [place];  Service: Thoracic;  Laterality: N/A;  . Tee without cardioversion N/A 05/11/2014    Procedure: TRANSESOPHAGEAL ECHOCARDIOGRAM (TEE);  Surgeon: Melrose Nakayama, MD;  Location: [facility];  Service: Thoracic;  Laterality: N/A;   Family History  Problem Relation Age of Onset  . COPD Mother   . COPD Sister   . Hypertension Sister   . Cancer Father   . Asthma Father   . Diabetes Other     3 maternal aunts  . Prostate cancer Paternal Uncle    History  Substance Use Topics  . Smoking status: Former Smoker -- 1.00 packs/day for 30 years    Types: Cigarettes    Quit  date: 06/17/2004  . Smokeless tobacco: Never Used  . Alcohol Use: 1.0 oz/week    2 drink(s) per week     Comment: none iin a month   OB History    No data available     Review of Systems  Unable to perform ROS: Acuity of condition      Allergies  Lisinopril; Advair diskus; Penicillins; and Levaquin  Home Medications   Prior to Admission medications   Medication Sig Start Date End Date Taking? Authorizing Provider  ALPRAZolam (XANAX) 0.25 MG tablet Take 1 tablet (0.25 mg total) by mouth 2 (two) times daily as needed for anxiety. 05/17/14  Yes Grace Bushy Minor, NP  budesonide-formoterol (SYMBICORT) 80-4.5  MCG/ACT inhaler Inhale 2 puffs into the lungs 2 (two) times daily. 04/05/14  Yes [name] L Reed, DO  hydrALAZINE (APRESOLINE) 25 MG tablet Take 1 tablet (25 mg total) by mouth every 8 (eight) hours. 01/21/14  Yes [name] L Reed, DO  insulin aspart (NOVOLOG) 100 UNIT/ML injection Inject 5 Units into the skin 4 (four) times daily -  before meals and at bedtime. For cbg >=150   Yes Historical Provider, MD  levalbuterol (XOPENEX) 0.63 MG/3ML nebulizer solution Take 0.63 mg by nebulization every 6 (six) hours as needed for wheezing or shortness of breath.   Yes Historical Provider, MD  lovastatin (MEVACOR) 40 MG tablet Take 1 tablet (40 mg total) by mouth at bedtime. For hyperlipedemia 10/27/13  Yes [name] L Reed, DO  Multiple Vitamins-Minerals (MULTIVITAMIN ADULTS 50+) TABS Take 1 tablet by mouth daily.   Yes Historical Provider, MD  pantoprazole (PROTONIX) 40 MG tablet Take 1 tablet (40 mg total) by mouth daily. 02/16/14  Yes Jerene Bears, MD  promethazine-codeine (PHENERGAN WITH CODEINE) 6.25-10 MG/5ML syrup Take 5 mLs by mouth every 6 (six) hours as needed for cough. 04/14/14  Yes Deneise Lever, MD  sertraline (ZOLOFT) 50 MG tablet Take 1 tablet (50 mg total) by mouth at bedtime. 01/21/14  Yes [name] L Reed, DO  tiotropium (SPIRIVA) 18 MCG inhalation capsule Place 1 capsule (18 mcg total) into inhaler and inhale daily. 10/22/13  Yes [name] L Reed, DO   BP 132/73 mmHg  Pulse 95  Temp(Src) 99.7 F (37.6 C) (Other (Comment))  Resp 19  Ht 5\' 6"  (1.676 m)  Wt 181 lb 14.1 oz (82.5 kg)  BMI 29.37 kg/m2  SpO2 97% Physical Exam  Constitutional: She appears well-developed and well-nourished. She appears distressed.  Obtunded, minimally responsive  HENT:  Head: Normocephalic and atraumatic.  Mouth/Throat: Oropharynx is clear and moist. No oropharyngeal exudate.  Eyes: Conjunctivae and EOM are normal. Pupils are equal, round, and reactive to light.  Neck: Normal range of motion. Neck supple.   Cardiovascular: Normal rate, regular rhythm and normal heart sounds.   No murmur heard. Pulmonary/Chest: She is in respiratory distress.  Decreased breath sounds throughout Epigastric surgical incision healing well.  Abdominal: Soft. There is no tenderness. There is no rebound and no guarding.  Musculoskeletal: Normal range of motion. She exhibits no edema or tenderness.  Neurological:  Obtunded, minimally responsive, does not follow commands    ED Course  Procedures   INTUBATION Performed by: Ezequiel Essex, MD  Required items: required blood products, implants, devices, and special equipment available Patient identity confirmed: provided demographic data and hospital-assigned identification number Time out: Immediately prior to procedure a "time out" was called to verify the correct patient, procedure, equipment, support staff and site/side marked as required.  Indications: respiratory  failure  Intubation method: Glidescope Laryngoscopy   Preoxygenation: BVM  Sedatives: Etomidate Paralytic: rocuronium  Tube Size: 7.5 cuffed  Post-procedure assessment: chest rise and ETCO2 monitor Breath sounds: equal and absent over the epigastrium Tube secured with: ETT holder Chest x-ray interpreted by radiologist and me.  Chest x-ray findings: endotracheal tube in appropriate position  Patient tolerated the procedure well with no immediate complications.     Labs Review Labs Reviewed  CBC WITH DIFFERENTIAL - Abnormal; Notable for the following:    RBC 3.50 (*)    Hemoglobin 9.5 (*)    HCT 32.9 (*)    MCHC 28.9 (*)    RDW 17.3 (*)    Neutrophils Relative % 83 (*)    Lymphocytes Relative 11 (*)    Neutro Abs 8.6 (*)    All other components within normal limits  COMPREHENSIVE METABOLIC PANEL - Abnormal; Notable for the following:    Potassium 5.5 (*)    Glucose, Bld 185 (*)    Creatinine, Ser 1.35 (*)    Albumin 3.0 (*)    Total Bilirubin 0.2 (*)    GFR calc non Af  Amer 39 (*)    GFR calc Af Amer 46 (*)    All other components within normal limits  URINALYSIS, ROUTINE W REFLEX MICROSCOPIC - Abnormal; Notable for the following:    Color, Urine ORANGE (*)    APPearance TURBID (*)    Bilirubin Urine MODERATE (*)    Ketones, ur 15 (*)    Protein, ur >300 (*)    All other components within normal limits  CBC - Abnormal; Notable for the following:    RBC 3.39 (*)    Hemoglobin 9.4 (*)    HCT 31.4 (*)    MCHC 29.9 (*)    RDW 17.2 (*)    All other components within normal limits  CREATININE, SERUM - Abnormal; Notable for the following:    Creatinine, Ser 1.32 (*)    GFR calc non Af Amer 40 (*)    GFR calc Af Amer 47 (*)    All other components within normal limits  BLOOD GAS, ARTERIAL - Abnormal; Notable for the following:    pH, Arterial 7.258 (*)    pCO2 arterial 69.6 (*)    pO2, Arterial 55.6 (*)    Bicarbonate 30.1 (*)    Acid-Base Excess 3.5 (*)    All other components within normal limits  GLUCOSE, CAPILLARY - Abnormal; Notable for the following:    Glucose-Capillary 128 (*)    All other components within normal limits  URINE MICROSCOPIC-ADD ON - Abnormal; Notable for the following:    Squamous Epithelial / LPF MANY (*)    Casts HYALINE CASTS (*)    All other components within normal limits  PHOSPHORUS - Abnormal; Notable for the following:    Phosphorus 2.2 (*)    All other components within normal limits  GLUCOSE, CAPILLARY - Abnormal; Notable for the following:    Glucose-Capillary 68 (*)    All other components within normal limits  GLUCOSE, CAPILLARY - Abnormal; Notable for the following:    Glucose-Capillary 113 (*)    All other components within normal limits  GLUCOSE, CAPILLARY - Abnormal; Notable for the following:    Glucose-Capillary 143 (*)    All other components within normal limits  CBG MONITORING, ED - Abnormal; Notable for the following:    Glucose-Capillary 158 (*)    All other components within normal limits   POCT I-STAT  3, ART BLOOD GAS (G3+) - Abnormal; Notable for the following:    pH, Arterial 7.209 (*)    pCO2 arterial 84.2 (*)    pO2, Arterial 212.0 (*)    Bicarbonate 33.6 (*)    Acid-Base Excess 4.0 (*)    All other components within normal limits  CULTURE, BLOOD (ROUTINE X 2)  CULTURE, BLOOD (ROUTINE X 2)  PROTIME-INR  TRIGLYCERIDES  MAGNESIUM  MAGNESIUM  PHOSPHORUS  CBC  BASIC METABOLIC PANEL  PHOSPHORUS  MAGNESIUM  I-STAT CG4 LACTIC ACID, ED  I-STAT CHEM 8, ED  CG4 I-STAT (LACTIC ACID)    Imaging Review Dg Chest Portable 1 View  05/20/2014   CLINICAL DATA:  Acute onset of shortness of breath. Respiratory distress. Current history of diabetes and chronic moderate asthma. Initial encounter.  EXAM: PORTABLE CHEST - 1 VIEW  COMPARISON:  Chest radiograph performed 05/15/2014  FINDINGS: The patient's endotracheal tube is seen ending 5-6 cm above the carina. An enteric tube is noted extending below the diaphragm.  Small bilateral pleural effusions are again seen, left greater than right. Persistent retrocardiac airspace opacification may reflect atelectasis or pneumonia. Interstitial edema might have a similar appearance, given persistent increased interstitial markings. No pneumothorax is seen.  The cardiomediastinal silhouette is enlarged. No acute osseous abnormalities are identified.  IMPRESSION: 1. Endotracheal tube seen ending 5-6 cm above the carina. 2. Small bilateral pleural effusions again seen, left greater than right. Persistent retrocardiac airspace opacification may reflect atelectasis or pneumonia. Given increased interstitial markings, interstitial edema might have a similar appearance. 3. Cardiomegaly noted.   Electronically Signed   By: Garald Balding M.D.   On: 05/20/2014 02:57     EKG Interpretation None      MDM   Final diagnoses:  SOB (shortness of breath)  Hypercapnic respiratory failure, chronic   Patient presents by EMS with respiratory distress and  hypoxia. She is unresponsive and being bagged. Discharge from hospital 2 days ago after prolonged hospitalization for respiratory failure. Had pericardial window on 11.25.  Intubated on arrival as patient was obtunded and not protecting airway.  Suspect hypercapnic failure. With bagging, O2 saturations increased to 93% before intubation. Nebs, steroids. CXR.  Ph 7.2 with PCO2 88.  RR increased to 24. CXR with small effusions, stable.  Admission to ICU d/w DR. Deterding   Date: 05/20/2014  Rate: 96  Rhythm: normal sinus rhythm  QRS Axis: normal  Intervals: QT prolonged  ST/T Wave abnormalities: normal  Conduction Disutrbances:none  Narrative Interpretation:   Old EKG Reviewed: none available  CRITICAL CARE Performed by: Ezequiel Essex Total critical care time: 45 Critical care time was exclusive of separately billable procedures and treating other patients. Critical care was necessary to treat or prevent imminent or life-threatening deterioration. Critical care was time spent personally by me on the following activities: development of treatment plan with patient and/or surrogate as well as nursing, discussions with consultants, evaluation of patient's response to treatment, examination of patient, obtaining history from patient or surrogate, ordering and performing treatments and interventions, ordering and review of laboratory studies, ordering and review of radiographic studies, pulse oximetry and re-evaluation of patient's condition.     Ezequiel Essex, MD 05/20/14 Vernelle Emerald

## 2014-05-20 NOTE — Progress Notes (Signed)
INITIAL NUTRITION ASSESSMENT  DOCUMENTATION CODES Per approved criteria  -Not Applicable   INTERVENTION:  Initiate TF via OGT with Vital AF 1.2 at 25 ml/h and Prostat 30 ml BID on day 1; on day 2, d/c Prostat and increase to goal rate of 60 ml/h (1440 ml per day) to provide 1728 kcals, 108 gm protein, 1168 ml free water daily.  NUTRITION DIAGNOSIS: Inadequate oral intake related to inability to eat as evidenced by NPO status.   Goal: Intake to meet >90% of estimated nutrition needs.  Monitor:  TF tolerance/adequacy, weight trend, labs, vent status.  Reason for Assessment: MD Consult for TF initiation and management.  68 y.o. female  Admitting Dx: Respiratory failure  ASSESSMENT: 68 yo female from [place] living with altered mental status from AECOPD with acute on chronic hypoxic/hypercapnic respiratory failure.Recent admit for cellulitis, respiratory failure, and pericardial effusion s/p window 05/11/14.  Discussed patient in ICU rounds today. Nutrition focused physical exam completed.  No muscle or subcutaneous fat depletion noticed.  Patient is currently intubated on ventilator support MV: 11.2 L/min Temp (24hrs), Avg:96.3 F (35.7 C), Min:93.9 F (34.4 C), Max:100.3 F (37.9 C)  Propofol: to remain off   Height: Ht Readings from Last 1 Encounters:  05/20/14 5\' 6"  (1.676 m)    Weight: Wt Readings from Last 1 Encounters:  05/20/14 181 lb 14.1 oz (82.5 kg)    Ideal Body Weight: 59.1 kg  % Ideal Body Weight: 140%  Wt Readings from Last 10 Encounters:  05/20/14 181 lb 14.1 oz (82.5 kg)  05/18/14 177 lb (80.287 kg)  05/17/14 185 lb 8 oz (84.142 kg)  04/27/14 187 lb 3.2 oz (84.913 kg)  04/21/14 180 lb 8.9 oz (81.9 kg)  03/30/14 183 lb (83.008 kg)  03/15/14 184 lb (83.462 kg)  02/16/14 191 lb (86.637 kg)  02/03/14 191 lb 9.6 oz (86.909 kg)  01/21/14 189 lb 3.2 oz (85.821 kg)    Usual Body Weight: 191 lb  % Usual Body Weight: 95%  BMI:  Body mass index  is 29.37 kg/(m^2).  Estimated Nutritional Needs: Kcal: 1786 Protein: 100-115 gm Fluid: 1.8 L  Skin: no current issues  Diet Order: Diet NPO time specified  EDUCATION NEEDS: -Education not appropriate at this time   Intake/Output Summary (Last 24 hours) at 05/20/14 1054 Last data filed at 05/20/14 0930  Gross per 24 hour  Intake 796.72 ml  Output      0 ml  Net 796.72 ml    Last BM: PTA   Labs:   Recent Labs Lab 05/17/14 0331 05/20/14 0227 05/20/14 0312  NA 144 141  --   K 4.5 5.5*  --   CL 102 100  --   CO2 34* 30  --   BUN 18 17  --   CREATININE 1.14* 1.35* 1.32*  CALCIUM 8.9 9.3  --   MG 2.2  --   --   PHOS 3.2  --   --   GLUCOSE 99 185*  --     CBG (last 3)   Recent Labs  05/17/14 1144 05/20/14 0243 05/20/14 0750  GLUCAP 123* 158* 128*    Scheduled Meds: . antiseptic oral rinse  7 mL Mouth Rinse QID  . arformoterol  15 mcg Nebulization BID  . azithromycin  500 mg Intravenous Q24H  . budesonide  0.5 mg Nebulization BID  . chlorhexidine  15 mL Mouth Rinse BID  . feeding supplement (PRO-STAT SUGAR FREE 64)  30 mL Per Tube BID  .  feeding supplement (VITAL HIGH PROTEIN)  1,000 mL Per Tube Q24H  . heparin  5,000 Units Subcutaneous 3 times per day  . insulin aspart  0-15 Units Subcutaneous 6 times per day  . ipratropium-albuterol  3 mL Nebulization Q6H  . methylPREDNISolone (SOLU-MEDROL) injection  40 mg Intravenous 3 times per day  . pantoprazole sodium  40 mg Per Tube Q24H  . sertraline  50 mg Per Tube Daily    Continuous Infusions: . sodium chloride 100 mL/hr at 05/20/14 0354    Past Medical History  Diagnosis Date  . Asthma   . COPD (chronic obstructive pulmonary disease)   . Hypertension   . Diabetes mellitus 09/13/2010    glucophage  . Gout   . Oxygen dependent     pt uses O2   . Pericardial effusion 08/2011    small residual on echo  . Hyperlipidemia   . Type II or unspecified type diabetes mellitus with neurological  manifestations, uncontrolled   . Personal history of noncompliance with medical treatment, presenting hazards to health   . Unspecified disease of pericardium   . Hemorrhage of rectum and anus   . Panic disorder without agoraphobia   . Depressive disorder, not elsewhere classified   . Edema   . Anxiety state, unspecified   . Unspecified hereditary and idiopathic peripheral neuropathy   . Type II or unspecified type diabetes mellitus without mention of complication, not stated as uncontrolled   . Other and unspecified hyperlipidemia   . Obesity, unspecified   . Unspecified tinnitus   . Unspecified essential hypertension   . Pneumonia, organism unspecified   . Chronic obstructive asthma, unspecified   . Acute and chronic respiratory failure   . Urinary frequency   . Urgency of urination   . Anemia due to chronic blood loss 01/04/2013    Past Surgical History  Procedure Laterality Date  . Breast biopsy      both breasts  . Total abdominal hysterectomy    . Esophagogastroduodenoscopy N/A 01/03/2013    Procedure: ESOPHAGOGASTRODUODENOSCOPY (EGD);  Surgeon: Inda Castle, MD;  Location: [facility];  Service: Endoscopy;  Laterality: N/A;  . Colonoscopy N/A 01/05/2013    Procedure: COLONOSCOPY;  Surgeon: Gatha Mayer, MD;  Location: [facility];  Service: Endoscopy;  Laterality: N/A;  . Subxyphoid pericardial window N/A 05/11/2014    Procedure: SUBXYPHOID PERICARDIAL WINDOW;  Surgeon: Melrose Nakayama, MD;  Location: [facility];  Service: Thoracic;  Laterality: N/A;  . Tee without cardioversion N/A 05/11/2014    Procedure: TRANSESOPHAGEAL ECHOCARDIOGRAM (TEE);  Surgeon: Melrose Nakayama, MD;  Location: [facility];  Service: Thoracic;  Laterality: N/A;    Molli Barrows, RD, LDN, [place] Pager [phone] After Hours Pager [phone]

## 2014-05-20 NOTE — Clinical Social Work Note (Signed)
Clinical Social Worker informed by [facility] of patient being a resident at [facility]. CSW to follow up with SNF. CSW will follow pt and pt's family and facilitate pt's dc needs once medically stable.   Glendon Axe, MSW, LCSWA [phone] 05/20/2014 12:06 PM

## 2014-05-20 NOTE — ED Notes (Signed)
Patient from [facility].   See previous note at 0212

## 2014-05-20 NOTE — H&P (Signed)
PULMONARY / CRITICAL CARE MEDICINE   Name: [name] MRN: [id number] DOB: Jun 10, 1946    ADMISSION DATE:  05/20/2014  REFERRING MD :  EDP  CHIEF COMPLAINT:  Respiratory Failure  INITIAL PRESENTATION:   68 yo female from [place] living with altered mental status from AECOPD with acute on chronic hypoxic/hypercapnic respiratory failure.  She had recent admit for cellulitis, respiratory failure, and pericardial effusion s/p window 05/11/14.  She is followed by Dr. 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  STUDIES:   SIGNIFICANT EVENTS: 12/1 - discharged from [facility] to [facility] 12/4 - readmitted for hypoxic respiratory failure   HISTORY OF PRESENT ILLNESS:   Hx obtained from chart review.  68 yo female from [place] living with altered mental status from AECOPD with acute on chronic hypoxic/hypercapnic respiratory failure.  She had recent admit for cellulitis, respiratory failure, and pericardial effusion s/p window 05/11/14.  She is followed by Dr. 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   Note, she has end stage COPD, 4L O2 dependent, followed by MR as outpatient.  PFT's 2012: FEV1 .85, ratio 47%.   PAST MEDICAL HISTORY :   has a past medical history of Asthma; COPD (chronic obstructive pulmonary disease); Hypertension; Diabetes mellitus (09/13/2010); Gout; Oxygen dependent; Pericardial effusion (08/2011); Hyperlipidemia; Type II or unspecified type diabetes mellitus with neurological manifestations, uncontrolled; Personal history of noncompliance with medical treatment, presenting hazards to health; Unspecified disease of pericardium; Hemorrhage of rectum and anus; Panic disorder without agoraphobia; Depressive disorder, not elsewhere classified; Edema; Anxiety state, unspecified; Unspecified hereditary and idiopathic peripheral neuropathy; Type II or unspecified type diabetes mellitus without mention of complication, not stated as uncontrolled; Other and unspecified  hyperlipidemia; Obesity, unspecified; Unspecified tinnitus; Unspecified essential hypertension; Pneumonia, organism unspecified; Chronic obstructive asthma, unspecified; Acute and chronic respiratory failure; Urinary frequency; Urgency of urination; and Anemia due to chronic blood loss (01/04/2013).  has past surgical history that includes Breast biopsy; Total abdominal hysterectomy; Esophagogastroduodenoscopy (N/A, 01/03/2013); Colonoscopy (N/A, 01/05/2013); Subxyphoid pericardial window (N/A, 05/11/2014); and TEE without cardioversion (N/A, 05/11/2014). Prior to Admission medications   Medication Sig Start Date End Date Taking? Authorizing Provider  albuterol (PROAIR HFA) 108 (90 BASE) MCG/ACT inhaler Inhale 2 puffs into the lungs every 4 (four) hours as needed for shortness of breath. 04/27/14  Yes Tammy S Parrett, NP  albuterol (PROVENTIL) (2.5 MG/3ML) 0.083% nebulizer solution Take 2.5 mg by nebulization every 6 (six) hours as needed for shortness of breath.    Yes Historical Provider, MD  budesonide-formoterol (SYMBICORT) 80-4.5 MCG/ACT inhaler Inhale 2 puffs into the lungs 2 (two) times daily. 04/05/14  Yes [name] L Reed, DO  hydrALAZINE (APRESOLINE) 25 MG tablet Take 1 tablet (25 mg total) by mouth every 8 (eight) hours. 01/21/14  Yes [name] L Reed, DO  lovastatin (MEVACOR) 40 MG tablet Take 1 tablet (40 mg total) by mouth at bedtime. For hyperlipedemia 10/27/13  Yes [name] L Reed, DO  Multiple Vitamins-Minerals (MULTIVITAMIN ADULTS 50+) TABS Take 1 tablet by mouth daily.   Yes Historical Provider, MD  pantoprazole (PROTONIX) 40 MG tablet Take 1 tablet (40 mg total) by mouth daily. 02/16/14  Yes Jerene Bears, MD  promethazine-codeine (PHENERGAN WITH CODEINE) 6.25-10 MG/5ML syrup Take 5 mLs by mouth every 6 (six) hours as needed for cough. 04/14/14  Yes Deneise Lever, MD  sertraline (ZOLOFT) 50 MG tablet Take 1 tablet (50 mg total) by mouth at bedtime. 01/21/14  Yes [name] L Reed, DO  tiotropium  (  SPIRIVA) 18 MCG inhalation capsule Place 1 capsule (18 mcg total) into inhaler and inhale daily. 10/22/13  Yes [name] L Reed, DO   Allergies  Allergen Reactions  . Lisinopril Swelling    Angioedema   . Advair Diskus [Fluticasone-Salmeterol] Other (See Comments)    Nose bleeds.   . Penicillins     hives  . Levaquin [Levofloxacin] Other (See Comments)    FAMILY HISTORY:  Family History  Problem Relation Age of Onset  . COPD Mother   . COPD Sister   . Hypertension Sister   . Cancer Father   . Asthma Father   . Diabetes Other     3 maternal aunts  . Prostate cancer Paternal Uncle     SOCIAL HISTORY:  reports that she quit smoking about 9 years ago. Her smoking use included Cigarettes. She has a 30 pack-year smoking history. She has never used smokeless tobacco. She reports that she drinks about 1.0 oz of alcohol per week. She reports that she does not use illicit drugs.  REVIEW OF SYSTEMS:   Unable to obtain.  SUBJECTIVE:   VITAL SIGNS: Temp:  [93.9 F (34.4 C)-100.3 F (37.9 C)] 100.3 F (37.9 C) (12/04 0900) Pulse Rate:  [68-100] 97 (12/04 0900) Resp:  [6-24] 22 (12/04 0900) BP: (83-133)/(55-75) 94/65 mmHg (12/04 0700) SpO2:  [82 %-100 %] 93 % (12/04 0900) FiO2 (%):  [60 %-100 %] 60 % (12/04 0400) Weight:  [177 lb (80.287 kg)-181 lb 14.1 oz (82.5 kg)] 181 lb 14.1 oz (82.5 kg) (12/04 0500) HEMODYNAMICS:   VENTILATOR SETTINGS: Vent Mode:  [-] PRVC FiO2 (%):  [60 %-100 %] 60 % Set Rate:  [14 bmp-22 bmp] 22 bmp Vt Set:  [500 mL] 500 mL PEEP:  [5 cmH20] 5 cmH20 Plateau Pressure:  [18 cmH20] 18 cmH20 INTAKE / OUTPUT: Intake/Output      12/03 0701 - 12/04 0700 12/04 0701 - 12/05 0700   I.V. (mL/kg) 322.7 (3.9) 224 (2.7)   IV Piggyback 250    Total Intake(mL/kg) 572.7 (6.9) 224 (2.7)   Net +572.7 +224          PHYSICAL EXAMINATION: General: no distress Neuro: RASS -1, follows commands HEENT: ETT in place Cardiovascular: regular Lungs: decreased breath  sounds, no wheeze Abdomen: soft, non tender Musculoskeletal: ankle edema Skin: chest wound site clean/dry  LABS:  CBC  Recent Labs Lab 05/17/14 0331 05/20/14 0227 05/20/14 0312  WBC 7.8 10.3 7.8  HGB 9.0* 9.5* 9.4*  HCT 30.0* 32.9* 31.4*  PLT 282 336 252   Coag's  Recent Labs Lab 05/20/14 0227  INR 1.02     BMET  Recent Labs Lab 05/17/14 0331 05/20/14 0227 05/20/14 0312  NA 144 141  --   K 4.5 5.5*  --   CL 102 100  --   CO2 34* 30  --   BUN 18 17  --   CREATININE 1.14* 1.35* 1.32*  GLUCOSE 99 185*  --    Electrolytes  Recent Labs Lab 05/17/14 0331 05/20/14 0227  CALCIUM 8.9 9.3  MG 2.2  --   PHOS 3.2  --    ABG  Recent Labs Lab 05/20/14 0532  PHART 7.258*  PCO2ART 69.6*  PO2ART 55.6*   Liver Enzymes  Recent Labs Lab 05/20/14 0227  AST 19  ALT 24  ALKPHOS 94  BILITOT 0.2*  ALBUMIN 3.0*    Glucose  Recent Labs Lab 05/16/14 1629 05/16/14 2033 05/17/14 0729 05/17/14 1144 05/20/14 0243 05/20/14 0750  GLUCAP 134* 112* 98 123* 158* 128*    Imaging Dg Chest Portable 1 View  05/20/2014   CLINICAL DATA:  Acute onset of shortness of breath. Respiratory distress. Current history of diabetes and chronic moderate asthma. Initial encounter.  EXAM: PORTABLE CHEST - 1 VIEW  COMPARISON:  Chest radiograph performed 05/15/2014  FINDINGS: The patient's endotracheal tube is seen ending 5-6 cm above the carina. An enteric tube is noted extending below the diaphragm.  Small bilateral pleural effusions are again seen, left greater than right. Persistent retrocardiac airspace opacification may reflect atelectasis or pneumonia. Interstitial edema might have a similar appearance, given persistent increased interstitial markings. No pneumothorax is seen.  The cardiomediastinal silhouette is enlarged. No acute osseous abnormalities are identified.  IMPRESSION: 1. Endotracheal tube seen ending 5-6 cm above the carina. 2. Small bilateral pleural effusions  again seen, left greater than right. Persistent retrocardiac airspace opacification may reflect atelectasis or pneumonia. Given increased interstitial markings, interstitial edema might have a similar appearance. 3. Cardiomegaly noted.   Electronically Signed   By: Garald Balding M.D.   On: 05/20/2014 02:57    ASSESSMENT / PLAN:  PULMONARY OETT 12/4  >>> A: Acute on chronic hypercarbic and hypoxic respiratory failure 2nd to AECOPD.  She has multiple intubations in last 1 month. P:   Adjust vent to avoid PEEPi Continue solumedrol Brovana/pulmicort/duonebs Will likely need tracheostomy  CARDIOVASCULAR A:  Hx of HTN, HLD. Cardiomegaly with hx of pericardial effusion s/p pericardial window 11/25 [name]). Hypotension after intubation >> likely from PEEPi and sedation. Lt leg swelling. P:  Hold outpatient hydralazine Continue lovastatin per tube Continue IV fluids Doppler left leg  RENAL A:   CKD P:   Monitor renal fx, urine outpt  GASTROINTESTINAL A:   SUP Nutrition P:   Protonix. NPO. Tube feeds while on vent  HEMATOLOGIC A:   Anemia of chronic disease and critical illness VTE prophylaxis P:  SCD's / Heparin F/u CBC  INFECTIOUS A:   AECOPD P:   Day 1 zithromax  Sputum 12/04 >>   ENDOCRINE A:   DM II Hx gout P:  CBG's q4hrs. SSI.  NEUROLOGIC A:   Acute metabolic encephalopathy in setting respiratory failure. Hx depression, anxiety, panic attacks. P:   RASS goal: 0. Daily WUA. Continue zoloft, prn ativan, fentanyl Hold outpatient xanax   Family updated:  Son [place] over phone.  He lives in [place] but is planning on coming down perhaps Friday night after work.  Interdisciplinary Family Meeting v Palliative Care Meeting:  Due by: 12/11.   SUMMARY: She has recurrent respiratory failure requiring intubation.  She has very severe COPD at baseline.  She will likely need tracheostomy and long term vent support if family wishes to continue  aggressive therapy.  CC time 50 minutes.  [name], MD [facility] Pulmonary/Critical Care 05/20/2014, 10:27 AM Pager:  [phone] After 3pm call: [phone]

## 2014-05-20 NOTE — Progress Notes (Signed)
68yo female presents from NH w/ O2 sats in 31s w/ rales in all lung fields, to begin IV ABX for COPD exacerbation.  Will start azithromycin 500mg  IV Q24H (initial choice was Levaquin but pt has allergy w/ unknown reaction) and monitor CBC and Cx.  Wynona Neat, PharmD, BCPS 05/20/2014 3:54 AM

## 2014-05-20 NOTE — ED Notes (Signed)
Dr Wyvonnia Dusky attempted to call next of kin.  No answer Ceryl Kudrna [phone]

## 2014-05-20 NOTE — Progress Notes (Signed)
Called to bedside d/t pt w/ high RR.  RR 34-36 bpm.  Increased PS to 15, RR now 12-18.  HR 95. Sat 97%, RN aware.

## 2014-05-20 NOTE — ED Notes (Addendum)
EMS reports patient is from [facility].  Staff reported that her initial sats were in the 75's.  Normally patient is on 4 liters via [place].  EMS arrived to the ED with bagging in progress. They stated she has been unresponsive the entire time and has rales in all ling fields.   BS 138

## 2014-05-21 ENCOUNTER — Inpatient Hospital Stay (HOSPITAL_COMMUNITY): Payer: Medicare Other

## 2014-05-21 DIAGNOSIS — R609 Edema, unspecified: Secondary | ICD-10-CM

## 2014-05-21 LAB — BASIC METABOLIC PANEL
Anion gap: 11 (ref 5–15)
BUN: 31 mg/dL — ABNORMAL HIGH (ref 6–23)
CO2: 30 mEq/L (ref 19–32)
CREATININE: 1.5 mg/dL — AB (ref 0.50–1.10)
Calcium: 9.2 mg/dL (ref 8.4–10.5)
Chloride: 103 mEq/L (ref 96–112)
GFR, EST AFRICAN AMERICAN: 40 mL/min — AB (ref >90)
GFR, EST NON AFRICAN AMERICAN: 35 mL/min — AB (ref >90)
Glucose, Bld: 154 mg/dL — ABNORMAL HIGH (ref 70–99)
Potassium: 5.2 mEq/L (ref 3.7–5.3)
Sodium: 144 mEq/L (ref 137–147)

## 2014-05-21 LAB — MAGNESIUM
MAGNESIUM: 2.5 mg/dL (ref 1.5–2.5)
Magnesium: 2.4 mg/dL (ref 1.5–2.5)
Magnesium: 2.3 mg/dL (ref 1.5–2.5)

## 2014-05-21 LAB — CBC
HCT: 27.9 % — ABNORMAL LOW (ref 36.0–46.0)
Hemoglobin: 8.6 g/dL — ABNORMAL LOW (ref 12.0–15.0)
MCH: 28 pg (ref 26.0–34.0)
MCHC: 30.8 g/dL (ref 30.0–36.0)
MCV: 90.9 fL (ref 78.0–100.0)
Platelets: 289 10*3/uL (ref 150–400)
RBC: 3.07 MIL/uL — ABNORMAL LOW (ref 3.87–5.11)
RDW: 16.8 % — AB (ref 11.5–15.5)
WBC: 9.4 10*3/uL (ref 4.0–10.5)

## 2014-05-21 LAB — GLUCOSE, CAPILLARY
GLUCOSE-CAPILLARY: 152 mg/dL — AB (ref 70–99)
GLUCOSE-CAPILLARY: 162 mg/dL — AB (ref 70–99)
Glucose-Capillary: 152 mg/dL — ABNORMAL HIGH (ref 70–99)
Glucose-Capillary: 145 mg/dL — ABNORMAL HIGH (ref 70–99)
Glucose-Capillary: 181 mg/dL — ABNORMAL HIGH (ref 70–99)
Glucose-Capillary: 167 mg/dL — ABNORMAL HIGH (ref 70–99)

## 2014-05-21 LAB — CLOSTRIDIUM DIFFICILE BY PCR: Toxigenic C. Difficile by PCR: NEGATIVE

## 2014-05-21 LAB — PHOSPHORUS
PHOSPHORUS: 3.6 mg/dL (ref 2.3–4.6)
PHOSPHORUS: 3.3 mg/dL (ref 2.3–4.6)
Phosphorus: 2.3 mg/dL (ref 2.3–4.6)

## 2014-05-21 MED ORDER — ALPRAZOLAM 0.5 MG PO TABS
0.5000 mg | ORAL_TABLET | Freq: Two times a day (BID) | ORAL | Status: DC | PRN
  Administered 2014-05-21 – 2014-05-26 (×3): 0.5 mg via ORAL
  Filled 2014-05-21 (×3): qty 1

## 2014-05-21 MED ORDER — FUROSEMIDE 10 MG/ML IJ SOLN
40.0000 mg | Freq: Once | INTRAMUSCULAR | Status: AC
  Administered 2014-05-21: 40 mg via INTRAVENOUS

## 2014-05-21 MED ORDER — VANCOMYCIN HCL 10 G IV SOLR
1500.0000 mg | Freq: Once | INTRAVENOUS | Status: AC
  Administered 2014-05-21: 1500 mg via INTRAVENOUS
  Filled 2014-05-21: qty 1500

## 2014-05-21 MED ORDER — PRAVASTATIN SODIUM 40 MG PO TABS
40.0000 mg | ORAL_TABLET | Freq: Every day | ORAL | Status: DC
  Administered 2014-05-21: 40 mg
  Filled 2014-05-21 (×4): qty 1

## 2014-05-21 NOTE — Progress Notes (Signed)
Results for orders placed or performed during the hospital encounter of 05/20/14  Blood culture (routine x 2)     Status: None (Preliminary result)   Collection Time: 05/20/14  2:15 AM  Result Value Ref Range Status   Specimen Description BLOOD LEFT ARM  Final   Special Requests   Final    BOTTLES DRAWN AEROBIC AND ANAEROBIC [place] RED 10CC BLUE   Culture  Setup Time   Final    05/20/2014 09:33 Performed at [facility]    Culture   Final    GRAM POSITIVE COCCI IN CLUSTERS Note: Gram Stain Report Called to,Read Back By and Verified With: MICHELLE TOLER RN/1:50AM/05/21/14/THOMI Performed at [facility]    Report Status PENDING  Incomplete     Current abx  Anti-infectives    Start     Dose/Rate Route Frequency Ordered Stop   05/20/14 0400  azithromycin (ZITHROMAX) 500 mg in dextrose 5 % 250 mL IVPB     500 mg250 mL/hr over 60 Minutes Intravenous Every 24 hours 05/20/14 0352        Recent Labs Lab 05/17/14 0331 05/20/14 0227 05/20/14 0312 05/21/14 0242  CREATININE 1.14* 1.35* 1.32* 1.50*      A [facility]  P Start vanc empific   Dr. Brand Males, M.D., F.C.C.P Pulmonary and Critical Care Medicine Staff Physician [place] [facility] and [facility] Pager: [phone], If no answer or between  15:00h - 7:00h: call [phone]  05/21/2014 4:51 AM

## 2014-05-21 NOTE — Progress Notes (Signed)
UR Completed.  Vergie Living [phone] 05/21/2014

## 2014-05-21 NOTE — Plan of Care (Signed)
Problem: ICU Phase Progression Outcomes Goal: O2 sats trending toward baseline Outcome: Completed/Met Date Met:  05/21/14 Goal: Dyspnea controlled at rest Outcome: Completed/Met Date Met:  05/21/14 Goal: Hemodynamically stable Outcome: Completed/Met Date Met:  05/21/14 Goal: Pain controlled with appropriate interventions Outcome: Completed/Met Date Met:  05/21/14

## 2014-05-21 NOTE — Progress Notes (Signed)
*  Preliminary Results* Left lower extremity venous duplex completed. Left lower extremity is negative for deep vein thrombosis. There is no evidence of left Baker's cyst.  05/21/2014 2:58 PM  Maudry Mayhew, RVT, RDCS, RDMS

## 2014-05-21 NOTE — Progress Notes (Signed)
PULMONARY / CRITICAL CARE MEDICINE   Name: [name] MRN: [id number] DOB: January 15, 1946    ADMISSION DATE:  05/20/2014  REFERRING MD :  EDP  CHIEF COMPLAINT:  Respiratory Failure  INITIAL PRESENTATION:   68 yo female from [place] living with altered mental status from AECOPD with acute on chronic hypoxic/hypercapnic respiratory failure.  She had recent admit for cellulitis, respiratory failure, and pericardial effusion s/p window 05/11/14.  She is followed by Dr. 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  PFT's 2012: FEV1 .85, ratio 47%.  STUDIES:   SIGNIFICANT EVENTS: 12/1 - discharged from [facility] to [facility] 12/4 - readmitted for hypoxic respiratory failure   SUBJECTIVE: afebrile Denies pain 'apneic' on wean this am Copious oral secretions  VITAL SIGNS: Temp:  [98.8 F (37.1 C)-100.8 F (38.2 C)] 99.4 F (37.4 C) (12/05 0900) Pulse Rate:  [78-106] 90 (12/05 0900) Resp:  [14-19] 16 (12/05 0821) BP: (85-159)/(56-88) 112/61 mmHg (12/05 0900) SpO2:  [92 %-100 %] 96 % (12/05 0900) FiO2 (%):  [40 %-50 %] 40 % (12/05 0900) Weight:  [82.7 kg (182 lb 5.1 oz)] 82.7 kg (182 lb 5.1 oz) (12/05 0408) HEMODYNAMICS:   VENTILATOR SETTINGS: Vent Mode:  [-] PRVC FiO2 (%):  [40 %-50 %] 40 % Set Rate:  [16 bmp] 16 bmp Vt Set:  [500 mL] 500 mL PEEP:  [5 cmH20] 5 cmH20 Pressure Support:  [10 cmH20-15 cmH20] 15 cmH20 Plateau Pressure:  [17 cmH20-25 cmH20] 17 cmH20 INTAKE / OUTPUT: Intake/Output      12/04 0701 - 12/05 0700 12/05 0701 - 12/06 0700   I.V. (mL/kg) 1484 (17.9) 100 (1.2)   NG/GT 435.8 138   IV Piggyback 750    Total Intake(mL/kg) 2669.8 (32.3) 238 (2.9)   Urine (mL/kg/hr) 485 (0.2)    Total Output 485     Net +2184.8 +238          PHYSICAL EXAMINATION: General: no distress Neuro: RASS -1, follows commands HEENT: ETT in place Cardiovascular: regular Lungs: decreased, coarse breath sounds, no wheeze Abdomen: soft, non tender Musculoskeletal: ankle  edema Skin: chest wound site clean/dry  LABS:  CBC  Recent Labs Lab 05/20/14 0227 05/20/14 0312 05/21/14 0242  WBC 10.3 7.8 9.4  HGB 9.5* 9.4* 8.6*  HCT 32.9* 31.4* 27.9*  PLT 336 252 289   Coag's  Recent Labs Lab 05/20/14 0227  INR 1.02     BMET  Recent Labs Lab 05/17/14 0331 05/20/14 0227 05/20/14 0312 05/21/14 0242  NA 144 141  --  144  K 4.5 5.5*  --  5.2  CL 102 100  --  103  CO2 34* 30  --  30  BUN 18 17  --  31*  CREATININE 1.14* 1.35* 1.32* 1.50*  GLUCOSE 99 185*  --  154*   Electrolytes  Recent Labs Lab 05/17/14 0331 05/20/14 0227 05/20/14 1149 05/20/14 2244 05/21/14 0242  CALCIUM 8.9 9.3  --   --  9.2  MG 2.2  --  2.3 2.3 2.4  PHOS 3.2  --  2.2* 3.2 2.3   ABG  Recent Labs Lab 05/20/14 0318 05/20/14 0532  PHART 7.209* 7.258*  PCO2ART 84.2* 69.6*  PO2ART 212.0* 55.6*   Liver Enzymes  Recent Labs Lab 05/20/14 0227  AST 19  ALT 24  ALKPHOS 94  BILITOT 0.2*  ALBUMIN 3.0*    Glucose  Recent Labs Lab 05/20/14 1205 05/20/14 1255 05/20/14 1538 05/20/14 1914 05/21/14 0416 05/21/14 0723  GLUCAP 68*  113* 143* 159* 162* 152*    Imaging Dg Chest Port 1 View  05/21/2014   CLINICAL DATA:  Ventilatory support, intubated, chest tube position  EXAM: PORTABLE CHEST - 1 VIEW  COMPARISON:  05/20/2014  FINDINGS: Endotracheal tube 5.8 cm above the carina. NG tube extends below the hemidiaphragms with the tip not visualized. Heart remains enlarged with diffuse airspace disease versus edema throughout the lungs, CHF is favored. Pleural effusions noted with left greater than right basilar atelectasis/consolidation. Atherosclerosis of the aorta.  IMPRESSION: Slight worsening CHF pattern.  Persistent pleural effusions with left greater than right basilar collapse/ consolidation.   Electronically Signed   By: Daryll Brod M.D.   On: 05/21/2014 09:43   Dg Chest Portable 1 View  05/20/2014   CLINICAL DATA:  Acute onset of shortness of breath.  Respiratory distress. Current history of diabetes and chronic moderate asthma. Initial encounter.  EXAM: PORTABLE CHEST - 1 VIEW  COMPARISON:  Chest radiograph performed 05/15/2014  FINDINGS: The patient's endotracheal tube is seen ending 5-6 cm above the carina. An enteric tube is noted extending below the diaphragm.  Small bilateral pleural effusions are again seen, left greater than right. Persistent retrocardiac airspace opacification may reflect atelectasis or pneumonia. Interstitial edema might have a similar appearance, given persistent increased interstitial markings. No pneumothorax is seen.  The cardiomediastinal silhouette is enlarged. No acute osseous abnormalities are identified.  IMPRESSION: 1. Endotracheal tube seen ending 5-6 cm above the carina. 2. Small bilateral pleural effusions again seen, left greater than right. Persistent retrocardiac airspace opacification may reflect atelectasis or pneumonia. Given increased interstitial markings, interstitial edema might have a similar appearance. 3. Cardiomegaly noted.   Electronically Signed   By: Garald Balding M.D.   On: 05/20/2014 02:57    ASSESSMENT / PLAN:  PULMONARY OETT 12/4  >>> A: Acute on chronic hypercarbic and hypoxic respiratory failure 2nd to AECOPD.  She has multiple intubations in last 1 month. 12/5 worsening edema P:   Adjust vent to avoid PEEPi Continue solumedrol Brovana/pulmicort/duonebs Tolerated 10/5 12/4 May need tracheostomy -depending on weaning progress this weekend  CARDIOVASCULAR A:  Hx of HTN, HLD. Cardiomegaly with hx of pericardial effusion s/p pericardial window 11/25 [name]). Hypotension after intubation >> likely from PEEPi and sedation. Lt leg swelling. P:  Hold outpatient hydralazine Continue lovastatin per tube dc IV fluids Doppler left leg neg 11/17  RENAL A:   CKD P:   Monitor renal fx, urine outpt Lasix 40 x1  GASTROINTESTINAL A:   SUP Nutrition P:    Protonix. NPO. Tube feeds while on vent  HEMATOLOGIC A:   Anemia of chronic disease and critical illness VTE prophylaxis P:  SCD's / Heparin F/u CBC  INFECTIOUS A:   AECOPD P:   zithromax  Sputum 12/04 >>  Blood 12/4 >> GPC clusters 1/2 >>  ENDOCRINE A:   DM II Hx gout P:  CBG's q4hrs. SSI.  NEUROLOGIC A:   Acute metabolic encephalopathy in setting respiratory failure. Hx depression, anxiety, panic attacks. P:   RASS goal: 0. Daily WUA. Continue zoloft, fentanyl resume outpatient xanax, dc ativan   Family updated:  Son [name] over phone.  He lives in [place] but is planning on coming down perhaps Friday night after work.  Interdisciplinary Family Meeting v Palliative Care Meeting:  Due by: 12/11.   SUMMARY: She has recurrent respiratory failure requiring intubation.  She has very severe COPD at baseline.  She may need tracheostomy and long term vent support if  family wishes to continue aggressive therapy. Will decide based on weaning progress over weekend  The patient is critically ill with multiple organ systems failure and requires high complexity decision making for assessment and support, frequent evaluation and titration of therapies, application of advanced monitoring technologies and extensive interpretation of multiple databases. Critical Care Time devoted to patient care services described in this note independent of APP time is 35 minutes.   Rigoberto Noel MD  05/21/2014, 10:39 AM

## 2014-05-21 NOTE — Progress Notes (Signed)
CRITICAL VALUE ALERT  Critical value received:  positive blood cultures; anerobic bottle for gram POSITIVE cocci in clusters  Date of notification:  05/21/14  Time of notification:  1610  Critical value read back:Yes.    Nurse who received alert:  Allegra Grana, RN  MD notified (1st page):  Warren Lacy MD  Time of first page:  0155  MD notified (2nd page):  Time of second page:  Responding MD:  Warren Lacy MD  Time MD responded:  [phone]

## 2014-05-21 NOTE — Progress Notes (Signed)
ANTIBIOTIC CONSULT NOTE - INITIAL  Pharmacy Consult for vancomycin Indication: bacteremia  Allergies  Allergen Reactions  . Lisinopril Swelling    Angioedema   . Advair Diskus [Fluticasone-Salmeterol] Other (See Comments)    Nose bleeds.   . Penicillins     hives  . Levaquin [Levofloxacin] Other (See Comments)    Patient Measurements: Height: 5\' 6"  (167.6 cm) Weight: 182 lb 5.1 oz (82.7 kg) IBW/kg (Calculated) : 59.3  Vital Signs: Temp: 98.8 F (37.1 C) (12/05 0400) BP: 142/81 mmHg (12/05 0400) Pulse Rate: 94 (12/05 0400) Intake/Output from previous day: 12/04 0701 - 12/05 0700 In: 1819.8 [I.V.:1234; NG/GT:335.8; IV Piggyback:250] Out: 435 [Urine:435] Intake/Output from this shift: Total I/O In: 775 [I.V.:350; NG/GT:175; IV Piggyback:250] Out: 205 [Urine:205]  Labs:  Recent Labs  05/20/14 0227 05/20/14 0312 05/21/14 0242  WBC 10.3 7.8 9.4  HGB 9.5* 9.4* 8.6*  PLT 336 252 289  CREATININE 1.35* 1.32* 1.50*   Estimated Creatinine Clearance: 38.9 mL/min (by C-G formula based on Cr of 1.5).   Microbiology: Recent Results (from the past 720 hour(s))  Culture, respiratory (NON-Expectorated)     Status: None   Collection Time: 05/04/14  5:21 AM  Result Value Ref Range Status   Specimen Description TRACHEAL ASPIRATE  Final   Special Requests NONE  Final   Gram Stain   Final    RARE WBC PRESENT, PREDOMINANTLY PMN NO SQUAMOUS EPITHELIAL CELLS SEEN RARE GRAM POSITIVE COCCI IN PAIRS Performed at [facility]    Culture   Final    Non-Pathogenic Oropharyngeal-type Flora Isolated. Performed at [facility]    Report Status 05/06/2014 FINAL  Final  MRSA PCR Screening     Status: None   Collection Time: 05/04/14  8:20 AM  Result Value Ref Range Status   MRSA by PCR NEGATIVE NEGATIVE Final    Comment:        The GeneXpert MRSA Assay (FDA approved for NASAL specimens only), is one component of a comprehensive MRSA colonization surveillance  program. It is not intended to diagnose MRSA infection nor to guide or monitor treatment for MRSA infections.   Culture, blood (routine x 2)     Status: None   Collection Time: 05/10/14  3:14 AM  Result Value Ref Range Status   Specimen Description BLOOD RIGHT FOREARM  Final   Special Requests BOTTLES DRAWN AEROBIC ONLY 3CC  Final   Culture  Setup Time   Final    05/10/2014 10:36 Performed at [place]   Final    NO GROWTH 5 DAYS Performed at [facility]    Report Status 05/16/2014 FINAL  Final  Culture, blood (routine x 2)     Status: None   Collection Time: 05/10/14  3:19 AM  Result Value Ref Range Status   Specimen Description BLOOD RIGHT ARM  Final   Special Requests BOTTLES DRAWN AEROBIC ONLY 3CC  Final   Culture  Setup Time   Final    05/10/2014 10:37 Performed at [place]   Final    NO GROWTH 5 DAYS Performed at [facility]    Report Status 05/16/2014 FINAL  Final  Culture, Urine     Status: None   Collection Time: 05/10/14  3:49 AM  Result Value Ref Range Status   Specimen Description URINE, CATHETERIZED  Final   Special Requests NONE  Final   Culture  Setup Time   Final  05/10/2014 09:12 Performed at [place] Performed at [facility]   Final   Culture NO GROWTH Performed at [facility]   Final   Report Status 05/11/2014 FINAL  Final  Culture, respiratory (NON-Expectorated)     Status: None   Collection Time: 05/10/14  4:12 AM  Result Value Ref Range Status   Specimen Description TRACHEAL ASPIRATE  Final   Special Requests NONE  Final   Gram Stain   Final    RARE WBC PRESENT, PREDOMINANTLY PMN RARE SQUAMOUS EPITHELIAL CELLS PRESENT RARE GRAM POSITIVE COCCI IN PAIRS Performed at [facility]    Culture   Final    Non-Pathogenic Oropharyngeal-type Flora Isolated. Performed at [facility]    Report Status  05/12/2014 FINAL  Final  Body fluid culture     Status: None   Collection Time: 05/11/14  3:20 PM  Result Value Ref Range Status   Specimen Description FLUID PERICARDIAL  Final   Special Requests NONE  Final   Gram Stain   Final    RARE WBC PRESENT, PREDOMINANTLY MONONUCLEAR NO ORGANISMS SEEN Performed at [facility]    Culture   Final    NO GROWTH 3 DAYS Performed at [facility]    Report Status 05/15/2014 FINAL  Final  Anaerobic culture     Status: None   Collection Time: 05/11/14  3:20 PM  Result Value Ref Range Status   Specimen Description FLUID PERICARDIAL  Final   Special Requests NONE  Final   Gram Stain   Final    RARE WBC PRESENT, PREDOMINANTLY MONONUCLEAR NO ORGANISMS SEEN Performed at [facility]    Culture   Final    NO ANAEROBES ISOLATED Performed at [facility]    Report Status 05/17/2014 FINAL  Final  AFB culture with smear     Status: None (Preliminary result)   Collection Time: 05/11/14  3:20 PM  Result Value Ref Range Status   Specimen Description FLUID PERICARDIAL  Final   Special Requests NONE  Final   Acid Fast Smear   Final    NO ACID FAST BACILLI SEEN Performed at [facility]    Culture   Final    CULTURE WILL BE EXAMINED FOR 6 WEEKS BEFORE ISSUING A FINAL REPORT Performed at [facility]    Report Status PENDING  Incomplete  Fungus Culture with Smear     Status: None (Preliminary result)   Collection Time: 05/11/14  3:20 PM  Result Value Ref Range Status   Specimen Description FLUID PERICARDIAL  Final   Special Requests NONE  Final   Fungal Smear   Final    NO YEAST OR FUNGAL ELEMENTS SEEN Performed at [facility]    Culture   Final    CULTURE IN PROGRESS FOR FOUR WEEKS Performed at [facility]    Report Status PENDING  Incomplete  Blood culture (routine x 2)     Status: None (Preliminary result)   Collection Time: 05/20/14  2:15 AM  Result Value Ref Range  Status   Specimen Description BLOOD LEFT ARM  Final   Special Requests   Final    BOTTLES DRAWN AEROBIC AND ANAEROBIC [place] RED 10CC BLUE   Culture  Setup Time   Final    05/20/2014 09:33 Performed at [place]  CLUSTERS Note: Gram Stain Report Called to,Read Back By and Verified With: MICHELLE TOLER RN/1:50AM/05/21/14/THOMI Performed at [facility]    Report Status PENDING  Incomplete    Medical History: Past Medical History  Diagnosis Date  . Asthma   . COPD (chronic obstructive pulmonary disease)   . Hypertension   . Diabetes mellitus 09/13/2010    glucophage  . Gout   . Oxygen dependent     pt uses O2   . Pericardial effusion 08/2011    small residual on echo  . Hyperlipidemia   . Type II or unspecified type diabetes mellitus with neurological manifestations, uncontrolled   . Personal history of noncompliance with medical treatment, presenting hazards to health   . Unspecified disease of pericardium   . Hemorrhage of rectum and anus   . Panic disorder without agoraphobia   . Depressive disorder, not elsewhere classified   . Edema   . Anxiety state, unspecified   . Unspecified hereditary and idiopathic peripheral neuropathy   . Type II or unspecified type diabetes mellitus without mention of complication, not stated as uncontrolled   . Other and unspecified hyperlipidemia   . Obesity, unspecified   . Unspecified tinnitus   . Unspecified essential hypertension   . Pneumonia, organism unspecified   . Chronic obstructive asthma, unspecified   . Acute and chronic respiratory failure   . Urinary frequency   . Urgency of urination   . Anemia due to chronic blood loss 01/04/2013    Medications:  Prescriptions prior to admission  Medication Sig Dispense Refill Last Dose  . ALPRAZolam (XANAX) 0.25 MG tablet Take 1 tablet (0.25 mg total) by mouth 2 (two) times daily as needed for anxiety. 30 tablet 0 unknown  .  budesonide-formoterol (SYMBICORT) 80-4.5 MCG/ACT inhaler Inhale 2 puffs into the lungs 2 (two) times daily. 4 Inhaler 11 05/19/2014 at Unknown time  . hydrALAZINE (APRESOLINE) 25 MG tablet Take 1 tablet (25 mg total) by mouth every 8 (eight) hours. 90 tablet 3 05/19/2014 at Unknown time  . insulin aspart (NOVOLOG) 100 UNIT/ML injection Inject 5 Units into the skin 4 (four) times daily -  before meals and at bedtime. For cbg >=150   05/19/2014 at Unknown time  . levalbuterol (XOPENEX) 0.63 MG/3ML nebulizer solution Take 0.63 mg by nebulization every 6 (six) hours as needed for wheezing or shortness of breath.   05/19/2014 at Unknown time  . lovastatin (MEVACOR) 40 MG tablet Take 1 tablet (40 mg total) by mouth at bedtime. For hyperlipedemia 30 tablet 3 05/19/2014 at Unknown time  . Multiple Vitamins-Minerals (MULTIVITAMIN ADULTS 50+) TABS Take 1 tablet by mouth daily.   05/19/2014 at Unknown time  . pantoprazole (PROTONIX) 40 MG tablet Take 1 tablet (40 mg total) by mouth daily. 30 tablet 3 05/19/2014 at Unknown time  . promethazine-codeine (PHENERGAN WITH CODEINE) 6.25-10 MG/5ML syrup Take 5 mLs by mouth every 6 (six) hours as needed for cough. 200 mL 0 05/19/2014 at Unknown time  . sertraline (ZOLOFT) 50 MG tablet Take 1 tablet (50 mg total) by mouth at bedtime. 30 tablet 3 05/19/2014 at Unknown time  . tiotropium (SPIRIVA) 18 MCG inhalation capsule Place 1 capsule (18 mcg total) into inhaler and inhale daily. 30 capsule 3 05/19/2014 at Unknown time   Scheduled:  . antiseptic oral rinse  7 mL Mouth Rinse QID  . arformoterol  15 mcg Nebulization BID  . azithromycin  500 mg Intravenous Q24H  . budesonide  0.5 mg Nebulization BID  .  chlorhexidine  15 mL Mouth Rinse BID  . heparin  5,000 Units Subcutaneous 3 times per day  . insulin aspart  0-15 Units Subcutaneous 6 times per day  . ipratropium-albuterol  3 mL Nebulization Q6H  . methylPREDNISolone (SOLU-MEDROL) injection  40 mg Intravenous 3 times per day   . pantoprazole sodium  40 mg Per Tube Q24H  . sertraline  50 mg Oral Daily   Infusions:  . sodium chloride 50 mL/hr at 05/20/14 1248  . feeding supplement (VITAL AF 1.2 CAL) 1,000 mL (05/20/14 1234)    Assessment: 68yo female admitted to MICU after recent hospitalization for pericardial window and acute respiratory failure, now w/ GPC in clusters, to begin IV ABX.  SCr has been trending up.  Goal of Therapy:  Vancomycin trough level 15-20 mcg/ml  Plan:  Will give vancomycin 1500mg  IV x1 and f/u w/ SCr tomorrow prior to redosing.  [name], PharmD, BCPS  05/21/2014,4:55 AM

## 2014-05-22 ENCOUNTER — Inpatient Hospital Stay (HOSPITAL_COMMUNITY): Payer: Medicare Other

## 2014-05-22 LAB — CBC
HEMATOCRIT: 26 % — AB (ref 36.0–46.0)
HEMOGLOBIN: 8.2 g/dL — AB (ref 12.0–15.0)
MCH: 28 pg (ref 26.0–34.0)
MCHC: 31.5 g/dL (ref 30.0–36.0)
MCV: 88.7 fL (ref 78.0–100.0)
Platelets: 290 10*3/uL (ref 150–400)
RBC: 2.93 MIL/uL — AB (ref 3.87–5.11)
RDW: 16.9 % — ABNORMAL HIGH (ref 11.5–15.5)
WBC: 9.3 10*3/uL (ref 4.0–10.5)

## 2014-05-22 LAB — CULTURE, BLOOD (ROUTINE X 2)

## 2014-05-22 LAB — BASIC METABOLIC PANEL
Anion gap: 10 (ref 5–15)
BUN: 36 mg/dL — AB (ref 6–23)
CHLORIDE: 102 meq/L (ref 96–112)
CO2: 31 mEq/L (ref 19–32)
CREATININE: 1.44 mg/dL — AB (ref 0.50–1.10)
Calcium: 9.2 mg/dL (ref 8.4–10.5)
GFR calc non Af Amer: 36 mL/min — ABNORMAL LOW (ref >90)
GFR, EST AFRICAN AMERICAN: 42 mL/min — AB (ref >90)
GLUCOSE: 199 mg/dL — AB (ref 70–99)
Potassium: 4.4 mEq/L (ref 3.7–5.3)
Sodium: 143 mEq/L (ref 137–147)

## 2014-05-22 LAB — POCT I-STAT 3, ART BLOOD GAS (G3+)
Acid-Base Excess: 11 mmol/L — ABNORMAL HIGH (ref 0.0–2.0)
Bicarbonate: 38.9 mEq/L — ABNORMAL HIGH (ref 20.0–24.0)
O2 SAT: 92 %
TCO2: 41 mmol/L (ref 0–100)
pCO2 arterial: 72.7 mmHg (ref 35.0–45.0)
pH, Arterial: 7.336 — ABNORMAL LOW (ref 7.350–7.450)
pO2, Arterial: 73 mmHg — ABNORMAL LOW (ref 80.0–100.0)

## 2014-05-22 LAB — MAGNESIUM
MAGNESIUM: 2.5 mg/dL (ref 1.5–2.5)
MAGNESIUM: 2.5 mg/dL (ref 1.5–2.5)

## 2014-05-22 LAB — PHOSPHORUS
PHOSPHORUS: 2.8 mg/dL (ref 2.3–4.6)
Phosphorus: 3.3 mg/dL (ref 2.3–4.6)

## 2014-05-22 LAB — GLUCOSE, CAPILLARY
GLUCOSE-CAPILLARY: 168 mg/dL — AB (ref 70–99)
GLUCOSE-CAPILLARY: 198 mg/dL — AB (ref 70–99)
Glucose-Capillary: 198 mg/dL — ABNORMAL HIGH (ref 70–99)
Glucose-Capillary: 181 mg/dL — ABNORMAL HIGH (ref 70–99)
Glucose-Capillary: 160 mg/dL — ABNORMAL HIGH (ref 70–99)
Glucose-Capillary: 188 mg/dL — ABNORMAL HIGH (ref 70–99)

## 2014-05-22 MED ORDER — VANCOMYCIN HCL 10 G IV SOLR
1250.0000 mg | INTRAVENOUS | Status: DC
  Administered 2014-05-22 – 2014-05-23 (×2): 1250 mg via INTRAVENOUS
  Filled 2014-05-22 (×2): qty 1250

## 2014-05-22 NOTE — Progress Notes (Signed)
RT spoke with Dr. Stevenson Clinch; advanced ET tube 2 cm (24 @lip )with no complications.  RT made RN aware. Dr. will wait for CXR in the morning.

## 2014-05-22 NOTE — Progress Notes (Signed)
PULMONARY / CRITICAL CARE MEDICINE   Name: [name] MRN: [id number] DOB: 05-Feb-1946    ADMISSION DATE:  05/20/2014  REFERRING MD :  EDP  CHIEF COMPLAINT:  Respiratory Failure  INITIAL PRESENTATION:   68 yo female from [place] living with altered mental status from AECOPD with acute on chronic hypoxic/hypercapnic respiratory failure.  She had recent admit for cellulitis, respiratory failure, and pericardial effusion s/p window 05/11/14.  She is followed by Dr. 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  PFT's 2012: FEV1 .85, ratio 47%.  STUDIES:   SIGNIFICANT EVENTS: 12/1 - discharged from [facility] to [facility] 12/4 - readmitted for hypoxic respiratory failure   SUBJECTIVE: afebrile Denies pain Weaning well this am  oral secretions have decreased  VITAL SIGNS: Temp:  [98 F (36.7 C)-99.4 F (37.4 C)] 98 F (36.7 C) (12/06 0900) Pulse Rate:  [69-105] 89 (12/06 0900) Resp:  [16-32] 19 (12/06 0900) BP: (111-162)/(59-88) 160/81 mmHg (12/06 0900) SpO2:  [90 %-97 %] 97 % (12/06 0900) FiO2 (%):  [40 %-50 %] 40 % (12/06 0735) Weight:  [82.6 kg (182 lb 1.6 oz)] 82.6 kg (182 lb 1.6 oz) (12/06 0400) HEMODYNAMICS:   VENTILATOR SETTINGS: Vent Mode:  [-] PSV;CPAP FiO2 (%):  [40 %-50 %] 40 % Set Rate:  [16 bmp] 16 bmp Vt Set:  [500 mL] 500 mL PEEP:  [5 cmH20] 5 cmH20 Pressure Support:  [8 cmH20-10 cmH20] 10 cmH20 Plateau Pressure:  [17 cmH20-19 cmH20] 17 cmH20 INTAKE / OUTPUT: Intake/Output      12/05 0701 - 12/06 0700 12/06 0701 - 12/07 0700   I.V. (mL/kg) 450 (5.4)    NG/GT 1483 120   IV Piggyback 250    Total Intake(mL/kg) 2183 (26.4) 120 (1.5)   Urine (mL/kg/hr) 1810 (0.9) 50 (0.3)   Total Output 1810 50   Net +373 +70        Stool Occurrence 3 x      PHYSICAL EXAMINATION: General: no distress Neuro: RASS -1, follows commands HEENT: ETT in place Cardiovascular: regular Lungs: decreased, coarse breath sounds, no wheeze, TV about 300 range Abdomen:  soft, non tender Musculoskeletal: ankle edema Skin: chest wound site clean/dry  LABS:  CBC  Recent Labs Lab 05/20/14 0312 05/21/14 0242 05/22/14 0315  WBC 7.8 9.4 9.3  HGB 9.4* 8.6* 8.2*  HCT 31.4* 27.9* 26.0*  PLT 252 289 290   Coag's  Recent Labs Lab 05/20/14 0227  INR 1.02     BMET  Recent Labs Lab 05/20/14 0227 05/20/14 0312 05/21/14 0242 05/22/14 0315  NA 141  --  144 143  K 5.5*  --  5.2 4.4  CL 100  --  103 102  CO2 30  --  30 31  BUN 17  --  31* 36*  CREATININE 1.35* 1.32* 1.50* 1.44*  GLUCOSE 185*  --  154* 199*   Electrolytes  Recent Labs Lab 05/20/14 0227  05/21/14 0242 05/21/14 1320 05/21/14 2233 05/22/14 0315  CALCIUM 9.3  --  9.2  --   --  9.2  MG  --   < > 2.4 2.5 2.3  --   PHOS  --   < > 2.3 3.6 3.3  --   < > = values in this interval not displayed. ABG  Recent Labs Lab 05/20/14 0318 05/20/14 0532  PHART 7.209* 7.258*  PCO2ART 84.2* 69.6*  PO2ART 212.0* 55.6*   Liver Enzymes  Recent Labs Lab 05/20/14 0227  AST 19  ALT  24  ALKPHOS 94  BILITOT 0.2*  ALBUMIN 3.0*    Glucose  Recent Labs Lab 05/21/14 1144 05/21/14 1541 05/21/14 1931 05/21/14 2359 05/22/14 0318 05/22/14 0711  GLUCAP 145* 181* 167* 168* 198* 181*    Imaging Dg Chest Port 1 View  05/22/2014   CLINICAL DATA:  Acute respiratory failure  EXAM: PORTABLE CHEST - 1 VIEW  COMPARISON:  05/21/2014  FINDINGS: Slightly improved aeration over the bilateral upper lobes with persistent dense retrocardiac consolidation. Small bilateral pleural effusions persist. Endotracheal and nasogastric tubes are appropriately positioned.  IMPRESSION: Slight bilateral upper lobe improved aeration. Stable findings otherwise.   Electronically Signed   By: Conchita Paris M.D.   On: 05/22/2014 08:22   Dg Chest Port 1 View  05/21/2014   CLINICAL DATA:  Ventilatory support, intubated, chest tube position  EXAM: PORTABLE CHEST - 1 VIEW  COMPARISON:  05/20/2014  FINDINGS:  Endotracheal tube 5.8 cm above the carina. NG tube extends below the hemidiaphragms with the tip not visualized. Heart remains enlarged with diffuse airspace disease versus edema throughout the lungs, CHF is favored. Pleural effusions noted with left greater than right basilar atelectasis/consolidation. Atherosclerosis of the aorta.  IMPRESSION: Slight worsening CHF pattern.  Persistent pleural effusions with left greater than right basilar collapse/ consolidation.   Electronically Signed   By: Daryll Brod M.D.   On: 05/21/2014 09:43    ASSESSMENT / PLAN:  PULMONARY OETT 12/4  >>> A: Acute on chronic hypercarbic and hypoxic respiratory failure 2nd to AECOPD.  She has multiple intubations in last 1 month. 12/5 worsening edema P:   Adjust vent to avoid PEEPi Continue solumedrol Brovana/pulmicort/duonebs ABG on PS 5/5 - if OK extubate to bipap - May need tracheostomy -depending on weaning progress this weekend  CARDIOVASCULAR A:  Hx of HTN, HLD. Cardiomegaly with hx of pericardial effusion s/p pericardial window 11/25 [name]). Hypotension after intubation >> likely from PEEPi and sedation, resolved. Lt leg swelling -Doppler left leg neg 11/17 & 12/5 P:  Hold outpatient hydralazine Continue lovastatin per tube dc IV fluids   RENAL A:   CKD P:   Monitor renal fx, urine outpt Lasix 40 x1  GASTROINTESTINAL A:   SUP Nutrition P:   Protonix. NPO. Tube feeds while on vent  HEMATOLOGIC A:   Anemia of chronic disease and critical illness VTE prophylaxis P:  SCD's / Heparin F/u CBC  INFECTIOUS A:   AECOPD P:   zithromax  Sputum 12/04 >>  Blood 12/4 >> GPC clusters 1/2 >>  ENDOCRINE A:   DM II Hx gout P:  CBG's q4hrs. SSI.  NEUROLOGIC A:   Acute metabolic encephalopathy in setting respiratory failure. Hx depression, anxiety, panic attacks. P:   RASS goal: 0. Daily WUA. Continue zoloft, fentanyl resumed outpatient xanax, dc ativan   Family  updated:  Son [place] over phone.  He lives in [place] but is planning on coming down perhaps Friday night after work.  Interdisciplinary Family Meeting v Palliative Care Meeting:  Due by: 12/11.   SUMMARY: She has recurrent respiratory failure requiring intubation. Close to extubation - She has very severe COPD at baseline.Will decide based on weaning progress today She may need tracheostomy and long term vent support if family wishes to continue aggressive therapy.   The patient is critically ill with multiple organ systems failure and requires high complexity decision making for assessment and support, frequent evaluation and titration of therapies, application of advanced monitoring technologies and extensive interpretation of multiple databases.  Critical Care Time devoted to patient care services described in this note independent of APP time is 35 minutes.   Rigoberto Noel MD  05/22/2014, 9:22 AM

## 2014-05-22 NOTE — Progress Notes (Signed)
[place] Progress Note Patient Name: [name] DOB: June 28, 1945 MRN: [id number]   Date of Service  05/22/2014  HPI/Events of Note  Patient with questionable subq air at R neck base, cxr done  eICU Interventions  No PTX noted, ETT 6.5cm above carina, advise RT to advance ETT 2.0 cm        Margrete Delude 05/22/2014, 9:15 PM

## 2014-05-22 NOTE — Clinical Social Work Note (Signed)
Clinical Social Work Department BRIEF PSYCHOSOCIAL ASSESSMENT 05/22/2014  Patient:  [name], [name]     Account Number:  [id number]     Admit date:  05/20/2014  Clinical Social Worker:  Hubert Azure  Date/Time:  05/22/2014 11:58 AM  Referred by:  Physician  Date Referred:  05/22/2014 Referred for  Other - See comment   Other Referral:   From [place] type:  Family Other interview type:    PSYCHOSOCIAL DATA Living Status:  FACILITY Admitted from facility:  [facility] of care:  [place] Primary support name:  [name] [phone]) Primary support relationship to patient:  CHILD, ADULT Degree of support available:   Good    CURRENT CONCERNS Current Concerns  Post-Acute Placement   Other Concerns:    SOCIAL WORK ASSESSMENT / PLAN CSW contacted patient's son [name] and introduced self and explained role. CSW discussed d/c plan with son. Per [name], patient has been at [facility] for one week, but lives alone in [place]. [name] states patient will return to [facility] because she is unable to stand on her own and needs to continue with rehab to improve mobility and strength. [name] further requests he and sister [name] be contacted first as they are the decision makers for patient.   Assessment/plan status:  Other - See comment Other assessment/ plan:   CSW to update FL2 for return to [facility].   Information/referral to community resources:    PATIENT'S/FAMILY'S RESPONSE TO PLAN OF CARE: Patient's son was cooperative and expressed desire for patient to return to [facility] in order to improve her condition with rehab.   [place], [place] Weekend Clinical Social Worker [phone]

## 2014-05-22 NOTE — Progress Notes (Signed)
[facility] for vancomycin and azithromycin Indication: bacteremia and COPD exacerbation  Allergies  Allergen Reactions  . Lisinopril Swelling    Angioedema   . Advair Diskus [Fluticasone-Salmeterol] Other (See Comments)    Nose bleeds.   . Penicillins     hives  . Levaquin [Levofloxacin] Other (See Comments)    Patient Measurements: Height: 5\' 6"  (167.6 cm) Weight: 182 lb 1.6 oz (82.6 kg) IBW/kg (Calculated) : 59.3  Vital Signs: Temp: 98 F (36.7 C) (12/06 1000) Temp Source: Core (Comment) (12/06 0600) BP: 173/82 mmHg (12/06 1000) Pulse Rate: 105 (12/06 1000) Intake/Output from previous day: 12/05 0701 - 12/06 0700 In: 2183 [I.V.:450; LO/VF:6433; IV Piggyback:250] Out: 1810 [Urine:1810] Intake/Output from this shift: Total I/O In: 120 [NG/GT:120] Out: 50 [Urine:50]  Labs:  Recent Labs  05/20/14 0312 05/21/14 0242 05/22/14 0315  WBC 7.8 9.4 9.3  HGB 9.4* 8.6* 8.2*  PLT 252 289 290  CREATININE 1.32* 1.50* 1.44*   Estimated Creatinine Clearance: 40.5 mL/min (by C-G formula based on Cr of 1.44).   Microbiology: Recent Results (from the past 720 hour(s))  Culture, respiratory (NON-Expectorated)     Status: None   Collection Time: 05/04/14  5:21 AM  Result Value Ref Range Status   Specimen Description TRACHEAL ASPIRATE  Final   Special Requests NONE  Final   Gram Stain   Final    RARE WBC PRESENT, PREDOMINANTLY PMN NO SQUAMOUS EPITHELIAL CELLS SEEN RARE GRAM POSITIVE COCCI IN PAIRS Performed at [facility]    Culture   Final    Non-Pathogenic Oropharyngeal-type Flora Isolated. Performed at [facility]    Report Status 05/06/2014 FINAL  Final  MRSA PCR Screening     Status: None   Collection Time: 05/04/14  8:20 AM  Result Value Ref Range Status   MRSA by PCR NEGATIVE NEGATIVE Final    Comment:        The GeneXpert MRSA Assay (FDA approved for NASAL specimens only), is one component of  a comprehensive MRSA colonization surveillance program. It is not intended to diagnose MRSA infection nor to guide or monitor treatment for MRSA infections.   Culture, blood (routine x 2)     Status: None   Collection Time: 05/10/14  3:14 AM  Result Value Ref Range Status   Specimen Description BLOOD RIGHT FOREARM  Final   Special Requests BOTTLES DRAWN AEROBIC ONLY 3CC  Final   Culture  Setup Time   Final    05/10/2014 10:36 Performed at [facility]   Final    NO GROWTH 5 DAYS Performed at [facility]    Report Status 05/16/2014 FINAL  Final  Culture, blood (routine x 2)     Status: None   Collection Time: 05/10/14  3:19 AM  Result Value Ref Range Status   Specimen Description BLOOD RIGHT ARM  Final   Special Requests BOTTLES DRAWN AEROBIC ONLY 3CC  Final   Culture  Setup Time   Final    05/10/2014 10:37 Performed at [place]   Final    NO GROWTH 5 DAYS Performed at [facility]    Report Status 05/16/2014 FINAL  Final  Culture, Urine     Status: None   Collection Time: 05/10/14  3:49 AM  Result Value Ref Range Status   Specimen Description URINE, CATHETERIZED  Final   Special Requests NONE  Final   Culture  Setup Time   Final    05/10/2014 09:12 Performed at [place] Performed at [facility]   Final   Culture NO GROWTH Performed at [facility]   Final   Report Status 05/11/2014 FINAL  Final  Culture, respiratory (NON-Expectorated)     Status: None   Collection Time: 05/10/14  4:12 AM  Result Value Ref Range Status   Specimen Description TRACHEAL ASPIRATE  Final   Special Requests NONE  Final   Gram Stain   Final    RARE WBC PRESENT, PREDOMINANTLY PMN RARE SQUAMOUS EPITHELIAL CELLS PRESENT RARE GRAM POSITIVE COCCI IN PAIRS Performed at [facility]    Culture   Final    Non-Pathogenic Oropharyngeal-type Flora Isolated. Performed  at [facility]    Report Status 05/12/2014 FINAL  Final  Body fluid culture     Status: None   Collection Time: 05/11/14  3:20 PM  Result Value Ref Range Status   Specimen Description FLUID PERICARDIAL  Final   Special Requests NONE  Final   Gram Stain   Final    RARE WBC PRESENT, PREDOMINANTLY MONONUCLEAR NO ORGANISMS SEEN Performed at [facility]    Culture   Final    NO GROWTH 3 DAYS Performed at [facility]    Report Status 05/15/2014 FINAL  Final  Anaerobic culture     Status: None   Collection Time: 05/11/14  3:20 PM  Result Value Ref Range Status   Specimen Description FLUID PERICARDIAL  Final   Special Requests NONE  Final   Gram Stain   Final    RARE WBC PRESENT, PREDOMINANTLY MONONUCLEAR NO ORGANISMS SEEN Performed at [facility]    Culture   Final    NO ANAEROBES ISOLATED Performed at [facility]    Report Status 05/17/2014 FINAL  Final  AFB culture with smear     Status: None (Preliminary result)   Collection Time: 05/11/14  3:20 PM  Result Value Ref Range Status   Specimen Description FLUID PERICARDIAL  Final   Special Requests NONE  Final   Acid Fast Smear   Final    NO ACID FAST BACILLI SEEN Performed at [facility]    Culture   Final    CULTURE WILL BE EXAMINED FOR 6 WEEKS BEFORE ISSUING A FINAL REPORT Performed at [facility]    Report Status PENDING  Incomplete  Fungus Culture with Smear     Status: None (Preliminary result)   Collection Time: 05/11/14  3:20 PM  Result Value Ref Range Status   Specimen Description FLUID PERICARDIAL  Final   Special Requests NONE  Final   Fungal Smear   Final    NO YEAST OR FUNGAL ELEMENTS SEEN Performed at [facility]    Culture   Final    CULTURE IN PROGRESS FOR FOUR WEEKS Performed at [facility]    Report Status PENDING  Incomplete  Blood culture (routine x 2)     Status: None (Preliminary result)   Collection Time:  05/20/14  2:15 AM  Result Value Ref Range Status   Specimen Description BLOOD LEFT ARM  Final   Special Requests   Final    BOTTLES DRAWN AEROBIC AND ANAEROBIC [place] RED 10CC BLUE   Culture  Setup Time   Final    05/20/2014 09:33 Performed at [facility]  Final    GRAM POSITIVE COCCI IN CLUSTERS Note: Gram Stain Report Called to,Read Back By and Verified With: MICHELLE TOLER RN/1:50AM/05/21/14/THOMI Performed at [facility]    Report Status PENDING  Incomplete  Blood culture (routine x 2)     Status: None (Preliminary result)   Collection Time: 05/20/14  2:20 AM  Result Value Ref Range Status   Specimen Description BLOOD RIGHT ARM  Final   Special Requests   Final    BOTTLES DRAWN AEROBIC AND ANAEROBIC [place] RED 10CC BLUE   Culture  Setup Time   Final    05/20/2014 09:33 Performed at [facility]    Culture   Final           BLOOD CULTURE RECEIVED NO GROWTH TO DATE CULTURE WILL BE HELD FOR 5 DAYS BEFORE ISSUING A FINAL NEGATIVE REPORT Performed at [facility]    Report Status PENDING  Incomplete  Clostridium Difficile by PCR     Status: None   Collection Time: 05/21/14 10:35 AM  Result Value Ref Range Status   C difficile by pcr NEGATIVE NEGATIVE Final   Assessment: 68yo female admitted to MICU after recent hospitalization for pericardial window and acute respiratory failure. Started on azithromycin for COPD exacerbation, BCx 1/2 for GPC in clusters and vancomycin added to regimen. SCr was trending up from admit, currently 1.44 with est CrCl ~106mL/min. Received a vancomycin load of 1500mg  IV x1 yesterday morning. WBC wnl, afebrile.  Goal of Therapy:  Vancomycin trough level 15-20 mcg/ml  Plan:  1. Azithromycin 500mg  IV q24h 2. Vancomycin 1250mg  IV q24h 3. Follow renal function, c/s, clinical progression, trough at SS, LOT  Ronnika Collett D. Darshan Solanki, PharmD, BCPS Clinical Pharmacist Pager: [phone] 05/22/2014 10:23 AM

## 2014-05-23 ENCOUNTER — Inpatient Hospital Stay (HOSPITAL_COMMUNITY): Payer: Medicare Other

## 2014-05-23 DIAGNOSIS — J9622 Acute and chronic respiratory failure with hypercapnia: Secondary | ICD-10-CM

## 2014-05-23 LAB — POCT I-STAT 3, ART BLOOD GAS (G3+)
Acid-Base Excess: 10 mmol/L — ABNORMAL HIGH (ref 0.0–2.0)
BICARBONATE: 38.8 meq/L — AB (ref 20.0–24.0)
O2 Saturation: 91 %
PCO2 ART: 79.2 mmHg — AB (ref 35.0–45.0)
PH ART: 7.295 — AB (ref 7.350–7.450)
Patient temperature: 97.6
TCO2: 41 mmol/L (ref 0–100)
pO2, Arterial: 68 mmHg — ABNORMAL LOW (ref 80.0–100.0)

## 2014-05-23 LAB — GLUCOSE, CAPILLARY
GLUCOSE-CAPILLARY: 192 mg/dL — AB (ref 70–99)
Glucose-Capillary: 134 mg/dL — ABNORMAL HIGH (ref 70–99)
Glucose-Capillary: 136 mg/dL — ABNORMAL HIGH (ref 70–99)
Glucose-Capillary: 143 mg/dL — ABNORMAL HIGH (ref 70–99)
Glucose-Capillary: 175 mg/dL — ABNORMAL HIGH (ref 70–99)
Glucose-Capillary: 189 mg/dL — ABNORMAL HIGH (ref 70–99)

## 2014-05-23 LAB — TROPONIN I: Troponin I: <0.3 ng/mL (ref <0.30)

## 2014-05-23 MED ORDER — MIDAZOLAM HCL 2 MG/2ML IJ SOLN
2.0000 mg | INTRAMUSCULAR | Status: DC | PRN
  Administered 2014-05-23: 2 mg via INTRAVENOUS
  Filled 2014-05-23: qty 2

## 2014-05-23 MED ORDER — ETOMIDATE 2 MG/ML IV SOLN
20.0000 mg | Freq: Once | INTRAVENOUS | Status: AC
  Administered 2014-05-23: 20 mg via INTRAVENOUS

## 2014-05-23 MED ORDER — MIDAZOLAM HCL 2 MG/2ML IJ SOLN
2.0000 mg | Freq: Once | INTRAMUSCULAR | Status: AC
  Administered 2014-05-23: 2 mg via INTRAVENOUS

## 2014-05-23 MED ORDER — ETOMIDATE 2 MG/ML IV SOLN
INTRAVENOUS | Status: AC
  Administered 2014-05-23: 20 mg via INTRAVENOUS
  Filled 2014-05-23: qty 10

## 2014-05-23 MED ORDER — SODIUM CHLORIDE 0.9 % IV SOLN
25.0000 ug/h | INTRAVENOUS | Status: DC
  Administered 2014-05-23 (×2): 50 ug/h via INTRAVENOUS
  Filled 2014-05-23 (×2): qty 50

## 2014-05-23 MED ORDER — FENTANYL CITRATE 0.05 MG/ML IJ SOLN
50.0000 ug | Freq: Once | INTRAMUSCULAR | Status: AC
  Administered 2014-05-23: 50 ug via INTRAVENOUS
  Filled 2014-05-23: qty 2

## 2014-05-23 MED ORDER — MIDAZOLAM HCL 2 MG/2ML IJ SOLN
INTRAMUSCULAR | Status: AC
  Filled 2014-05-23: qty 2

## 2014-05-23 MED ORDER — FENTANYL BOLUS VIA INFUSION
25.0000 ug | INTRAVENOUS | Status: DC | PRN
  Filled 2014-05-23: qty 25

## 2014-05-23 NOTE — Progress Notes (Signed)
PULMONARY / CRITICAL CARE MEDICINE   Name: [name] MRN: [id number] DOB: 10/14/1945    ADMISSION DATE:  05/20/2014  REFERRING MD :  EDP  CHIEF COMPLAINT:  Respiratory Failure  INITIAL PRESENTATION:   68 yo female from [place] living with altered mental status from AECOPD with acute on chronic hypoxic/hypercapnic respiratory failure.  She had recent admit for cellulitis, respiratory failure, and pericardial effusion s/p window 05/11/14.  She is followed by Dr. 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  PFT's 2012: FEV1 .85, ratio 47%.  STUDIES:   SIGNIFICANT EVENTS: 12/1 - discharged from [facility] to [facility] 12/4 - readmitted for hypoxic respiratory failure   SUBJECTIVE:  Required FOB for mucous plugging Tolerating PSV 8 currently  VITAL SIGNS: Temp:  [97.3 F (36.3 C)-99.2 F (37.3 C)] 98 F (36.7 C) (12/07 1100) Pulse Rate:  [59-104] 79 (12/07 1100) Resp:  [12-26] 21 (12/07 1316) BP: (83-175)/(53-102) 130/74 mmHg (12/07 1316) SpO2:  [89 %-100 %] 96 % (12/07 1100) FiO2 (%):  [40 %-70 %] 40 % (12/07 1316) Weight:  [84.5 kg (186 lb 4.6 oz)] 84.5 kg (186 lb 4.6 oz) (12/07 0500) HEMODYNAMICS:   VENTILATOR SETTINGS: Vent Mode:  [-] PSV;CPAP FiO2 (%):  [40 %-70 %] 40 % Set Rate:  [16 bmp-22 bmp] 22 bmp Vt Set:  [500 mL] 500 mL PEEP:  [5 cmH20] 5 cmH20 Pressure Support:  [5 cmH20-10 cmH20] 5 cmH20 Plateau Pressure:  [19 cmH20-23 cmH20] 19 cmH20 INTAKE / OUTPUT: Intake/Output      12/06 0701 - 12/07 0700 12/07 0701 - 12/08 0700   I.V. (mL/kg) 101.8 (1.2) 12.5 (0.1)   NG/GT 1471 180   IV Piggyback 500 250   Total Intake(mL/kg) 2072.8 (24.5) 442.5 (5.2)   Urine (mL/kg/hr) 525 (0.3) 95 (0.2)   Total Output 525 95   Net +1547.8 +347.5          PHYSICAL EXAMINATION: General: no distress Neuro: RASS 0, follows commands HEENT: ETT in place Cardiovascular: regular Lungs: decreased, coarse breath sounds, no wheeze Abdomen: soft, non  tender Musculoskeletal: ankle edema Skin: chest wound site clean/dry  LABS:  CBC  Recent Labs Lab 05/20/14 0312 05/21/14 0242 05/22/14 0315  WBC 7.8 9.4 9.3  HGB 9.4* 8.6* 8.2*  HCT 31.4* 27.9* 26.0*  PLT 252 289 290   Coag's  Recent Labs Lab 05/20/14 0227  INR 1.02     BMET  Recent Labs Lab 05/20/14 0227 05/20/14 0312 05/21/14 0242 05/22/14 0315  NA 141  --  144 143  K 5.5*  --  5.2 4.4  CL 100  --  103 102  CO2 30  --  30 31  BUN 17  --  31* 36*  CREATININE 1.35* 1.32* 1.50* 1.44*  GLUCOSE 185*  --  154* 199*   Electrolytes  Recent Labs Lab 05/20/14 0227  05/21/14 0242  05/21/14 2233 05/22/14 0315 05/22/14 1144 05/22/14 2222  CALCIUM 9.3  --  9.2  --   --  9.2  --   --   MG  --   < > 2.4  < > 2.3  --  2.5 2.5  PHOS  --   < > 2.3  < > 3.3  --  3.3 2.8  < > = values in this interval not displayed. ABG  Recent Labs Lab 05/20/14 0532 05/22/14 1009 05/23/14 0205  PHART 7.258* 7.336* 7.295*  PCO2ART 69.6* 72.7* 79.2*  PO2ART 55.6* 73.0* 68.0*   Liver Enzymes  Recent Labs Lab 05/20/14 0227  AST 19  ALT 24  ALKPHOS 94  BILITOT 0.2*  ALBUMIN 3.0*    Glucose  Recent Labs Lab 05/22/14 1611 05/22/14 1911 05/23/14 0001 05/23/14 0400 05/23/14 0800 05/23/14 1155  GLUCAP 160* 188* 189* 143* 192* 134*    Imaging Dg Chest Port 1 View  05/23/2014   CLINICAL DATA:  Respiratory failure.  EXAM: PORTABLE CHEST - 1 VIEW  COMPARISON:  05/23/2014.  FINDINGS: Endotracheal tube and NG tube in stable position. Cardiomegaly with pulmonary vascular prominence. Bilateral pulmonary infiltrates and pleural effusions noted. The size of consistent with congestive heart failure. Basilar pneumonia cannot be excluded. No pneumothorax. No acute osseus abnormality.  IMPRESSION: 1. Lines and tubes in stable position. 2. Cardiomegaly with pulmonary venous congestion. 3. Bilateral pulmonary alveolar infiltrates and pleural effusions. These findings are consistent  with congestive heart failure with pulmonary edema. Bibasilar pneumonia cannot be excluded.   Electronically Signed   By: Marcello Moores  Register   On: 05/23/2014 07:18   Dg Chest Port 1 View  05/23/2014   CLINICAL DATA:  Acute respiratory acidosis, history asthma, COPD, hypertension, diabetes  EXAM: PORTABLE CHEST - 1 VIEW  COMPARISON:  Portable exam 0026 hr compared to 05/22/2014  FINDINGS: Tip of endotracheal tube projects 3.7 cm above carinal.  Nasogastric tube extends into abdomen.  At Enlargement of cardiac silhouette with pulmonary vascular congestion.  Persistent dense consolidation of LEFT lower lobe.  Probable small LEFT pleural effusion.  Hazy density at RIGHT base may represent atelectasis or infiltrate.  Upper lungs clear.  Component of basilar RIGHT pleural effusion not excluded, lung bases incompletely imaged on this exam.  No pneumothorax.  IMPRESSION: Enlargement of cardiac silhouette with pulmonary vascular congestion.  Persistent LEFT lower lobe consolidation with small LEFT pleural effusion and RIGHT basilar atelectasis versus infiltrate.   Electronically Signed   By: Lavonia Dana M.D.   On: 05/23/2014 00:53   Dg Chest Port 1 View  05/22/2014   CLINICAL DATA:  Lump on the right side of neck, shoulder area  EXAM: PORTABLE CHEST - 1 VIEW  COMPARISON:  05/22/2014  FINDINGS: There is left retrocardiac opacity likely representing combination of pleural fluid and atelectasis. There is a small right pleural effusion. There is bilateral mild interstitial thickening. There is no pneumothorax. There is stable cardiomegaly. There is a nasogastric tube coursing below the diaphragm. There is an endotracheal tube with the tip 6.5 cm above the carina.  IMPRESSION: Overall findings most concerning for mild CHF.   Electronically Signed   By: Kathreen Devoid   On: 05/22/2014 17:49   Dg Chest Port 1 View  05/22/2014   CLINICAL DATA:  Acute respiratory failure  EXAM: PORTABLE CHEST - 1 VIEW  COMPARISON:  05/21/2014   FINDINGS: Slightly improved aeration over the bilateral upper lobes with persistent dense retrocardiac consolidation. Small bilateral pleural effusions persist. Endotracheal and nasogastric tubes are appropriately positioned.  IMPRESSION: Slight bilateral upper lobe improved aeration. Stable findings otherwise.   Electronically Signed   By: Conchita Paris M.D.   On: 05/22/2014 08:22    ASSESSMENT / PLAN:  PULMONARY OETT 12/4  >>> A: Acute on chronic hypercarbic and hypoxic respiratory failure 2nd to AECOPD.  She has multiple intubations in last 1 month. Possible subcut air reported 12/7 > no PTX on CXR P:   Adjust vent to avoid PEEPi Continue solumedrol Brovana/pulmicort/duonebs May need tracheostomy ; I will need to discuss with pt and family Follow CXR  CARDIOVASCULAR  A:  Hx of HTN, HLD. Cardiomegaly with hx of pericardial effusion s/p pericardial window 11/25 [name]). Hypotension after intubation >> likely from PEEPi and sedation, resolved. Lt leg swelling -Doppler left leg neg 11/17 & 12/5 P:  Hold outpatient hydralazine Continue lovastatin per tube  RENAL A:   CKD, stable P:   Monitor renal fx, urine outpt  GASTROINTESTINAL A:   SUP Nutrition P:   Protonix. NPO. Tube feeds   HEMATOLOGIC A:   Anemia of chronic disease and critical illness VTE prophylaxis P:  SCD's / Heparin F/u CBC  INFECTIOUS A:   AECOPD Coag neg staph on blood cx P:   zithromax to end 12/8 vanco started 12/6 > d/c on 12/7  Sputum 12/04 >> negative Blood 12/4 >> GPC clusters 1/2 >> coag negative staph C diff 12/5 >. negative  ENDOCRINE A:   DM II Hx gout P:  CBG's q4hrs. SSI.  NEUROLOGIC A:   Acute metabolic encephalopathy in setting respiratory failure. Hx depression, anxiety, panic attacks. P:   RASS goal: 0. Daily WUA. Continue zoloft, fentanyl outpatient xanax   Family updated:    Interdisciplinary Family Meeting v Palliative Care Meeting:  Due by:  12/11.   SUMMARY: She has recurrent respiratory failure requiring intubation. Close to extubation - She has very severe COPD at baseline. May not be ab;e to progress without trach. Need to clarify with her family whether she would want reintubation or trach if she were to fail  Independent CC time 16 minutes    Baltazar Apo, MD, PhD 05/23/2014, 2:30 PM [facility] [phone] or if no answer [phone]

## 2014-05-23 NOTE — Procedures (Signed)
PCCM Bronchoscopy Procedure Note  The patient was informed of the risks (including but not limited to bleeding, infection, respiratory failure, lung injury, tooth/oral injury) and benefits of the procedure and gave consent, see chart.  Indication: Secretion clearance  Location: MICU  Condition pre procedure: Critically ill on MV  Medications for procedure: Etomidate 20mg  IV, versed 2mg  IV  Procedure description: The bronchoscope was introduced through the endotracheal tube and passed to the bilateral lungs to the level of the subsegmental bronchi throughout the tracheobronchial tree.  Airway exam revealed diffuse petechia as well as thick secretions clogging the end of the ETT.  This was removed.  Otherwise airway anatomy was normal.  No other significant findings.  Procedures performed: None  Specimens sent: None  Condition post procedure: Stable.  Airway pressures improved to approx 30 post procedure from 50's preprocedure.  EBL: None  Complications: None  Patient instructions: Cont MV

## 2014-05-23 NOTE — Progress Notes (Signed)
[facility] Progress Note Patient Name: [name] DOB: 27-Jan-1946 MRN: [id number]   Date of Service  05/23/2014  HPI/Events of Note  abg reviewed  eICU Interventions  Increase MV      Intervention Category Major Interventions: Acid-Base disturbance - evaluation and management  Raylene Miyamoto. 05/23/2014, 3:09 AM

## 2014-05-23 NOTE — Progress Notes (Signed)
RT tried to passed suction catheter and was unable to pass.  Patient begin to desat; RT began to ventilate with ambu bag. Patient was hard to ventilate with ambu bag; lavaged and bagged the patient.  RT was able to suction a small amount of brown tenacious secretions but was unable to get all.  Patient peak pressures in the upper 50's and plateau pressure was 21.  RT notified Dr. Titus Mould and he stated to advise the RN to turn off tube feed and get consent for bronch.

## 2014-05-23 NOTE — Progress Notes (Signed)
[facility] Progress Note Patient Name: [name] DOB: 12-Nov-1945 MRN: [id number]   Date of Service  05/23/2014  HPI/Events of Note  Pt reported neck tightness, hemodynamics and sats wnl  eICU Interventions  Stat pcxr (tube was just moved 1 hr ago), ecg, trop, abg Camera in: no distress, calm Rt also just switched out tubing     Intervention Category Major Interventions: Airway management  Raylene Miyamoto. 05/23/2014, 12:19 AM

## 2014-05-23 NOTE — Care Management Note (Addendum)
    Page 1 of 2   05/30/2014     11:12:42 AM CARE MANAGEMENT NOTE 05/30/2014  Patient:  [name], [name]   Account Number:  [id number]  Date Initiated:  05/21/2014  Documentation initiated by:  [facility]  Subjective/Objective Assessment:   Readmitted from SNF with acute resps failure - intubated. Talking about trach as this is 4-5 times intubated in short period.     Action/Plan:   Anticipated DC Date:  05/30/2014   Anticipated DC Plan:  SKILLED NURSING FACILITY  In-house referral  Clinical Social Worker      DC Planning Services  CM consult      Choice offered to / List presented to:             Status of service:  Completed, signed off Medicare Important Message given?  YES (If response is "NO", the following Medicare IM given date fields will be blank) Date Medicare IM given:  05/23/2014 Medicare IM given by:  [facility] Date Additional Medicare IM given:  05/30/2014 Additional Medicare IM given by:  Magdalen Spatz  Discharge Disposition:  [place]  Per UR Regulation:  Reviewed for med. necessity/level of care/duration of stay  If discussed at [place] of Stay Meetings, dates discussed:    Comments:  05-30-14 See NCM note below . SW [place] aware patient ready for discharge to SNF today . Magdalen Spatz RN BSN [phone]    05/29/1506:49 CM met with pt in room and gave her a home hospice list. Pt states she lives at [facility] and wishes to return to this SNF.  She states she wants the list so when it come time she has the list with her.  Pt plans to go home when she is ready to have home hospice. No other CM needs were communicated.  Mariane Masters, BSN, [place]    Contact:  Pargas,Ceryl [phone]      Drummond,Yvonne Sister [phone]  [phone]   [facility] Daughter   [phone]  05-23-14 2:20pm Luz Lex, RNBSN [phone] Remains on vent - awake and alert.  Physician wants to have conversation with her and family  about one way extubation or trach.    Talked to physician about Ltach candidate.  CM will continue to follow for progression.

## 2014-05-23 NOTE — Progress Notes (Signed)
PCCM Interval Note  I spoke with pt's son Ceryl by phone, reviewed her status, prognosis. Explained my concerns that she will not be able to stay off the vent for long based on her current WOB, her several recent failures. Broached the subject of longer term vent and trach. He believe she likely does not want this, but acknowledges that he doesn't want to lose her. It would be a burdern off of him if she is able to make these decisions for herself - he is prepared to honor her wishes. I will attempt to speak with her about goals for her care, reintubation, etc. Will call Ceryl back 11/8.    Baltazar Apo, MD, PhD 05/23/2014, 3:46 PM [facility] [phone] or if no answer [phone]

## 2014-05-24 ENCOUNTER — Inpatient Hospital Stay (HOSPITAL_COMMUNITY): Payer: Medicare Other

## 2014-05-24 LAB — CBC
HEMATOCRIT: 24.8 % — AB (ref 36.0–46.0)
HEMOGLOBIN: 7.7 g/dL — AB (ref 12.0–15.0)
MCH: 27 pg (ref 26.0–34.0)
MCHC: 31 g/dL (ref 30.0–36.0)
MCV: 87 fL (ref 78.0–100.0)
Platelets: 298 10*3/uL (ref 150–400)
RBC: 2.85 MIL/uL — ABNORMAL LOW (ref 3.87–5.11)
RDW: 17.4 % — AB (ref 11.5–15.5)
WBC: 7.7 10*3/uL (ref 4.0–10.5)

## 2014-05-24 LAB — GLUCOSE, CAPILLARY
GLUCOSE-CAPILLARY: 176 mg/dL — AB (ref 70–99)
GLUCOSE-CAPILLARY: 124 mg/dL — AB (ref 70–99)
Glucose-Capillary: 138 mg/dL — ABNORMAL HIGH (ref 70–99)
Glucose-Capillary: 139 mg/dL — ABNORMAL HIGH (ref 70–99)
Glucose-Capillary: 165 mg/dL — ABNORMAL HIGH (ref 70–99)
Glucose-Capillary: 166 mg/dL — ABNORMAL HIGH (ref 70–99)
Glucose-Capillary: 186 mg/dL — ABNORMAL HIGH (ref 70–99)

## 2014-05-24 LAB — CULTURE, RESPIRATORY: GRAM STAIN: NONE SEEN

## 2014-05-24 LAB — BASIC METABOLIC PANEL
Anion gap: 11 (ref 5–15)
BUN: 48 mg/dL — AB (ref 6–23)
CHLORIDE: 107 meq/L (ref 96–112)
CO2: 31 mEq/L (ref 19–32)
CREATININE: 1.28 mg/dL — AB (ref 0.50–1.10)
Calcium: 9.4 mg/dL (ref 8.4–10.5)
GFR calc Af Amer: 49 mL/min — ABNORMAL LOW (ref >90)
GFR calc non Af Amer: 42 mL/min — ABNORMAL LOW (ref >90)
Glucose, Bld: 172 mg/dL — ABNORMAL HIGH (ref 70–99)
POTASSIUM: 4.4 meq/L (ref 3.7–5.3)
Sodium: 149 mEq/L — ABNORMAL HIGH (ref 137–147)

## 2014-05-24 LAB — PHOSPHORUS: PHOSPHORUS: 2.7 mg/dL (ref 2.3–4.6)

## 2014-05-24 LAB — MAGNESIUM: Magnesium: 2.4 mg/dL (ref 1.5–2.5)

## 2014-05-24 LAB — CULTURE, RESPIRATORY W GRAM STAIN

## 2014-05-24 MED ORDER — HYDRALAZINE HCL 20 MG/ML IJ SOLN
10.0000 mg | Freq: Four times a day (QID) | INTRAMUSCULAR | Status: DC | PRN
  Administered 2014-05-24 – 2014-05-26 (×2): 10 mg via INTRAVENOUS
  Filled 2014-05-24 (×2): qty 1

## 2014-05-24 MED ORDER — PANTOPRAZOLE SODIUM 40 MG IV SOLR
40.0000 mg | Freq: Every day | INTRAVENOUS | Status: DC
  Administered 2014-05-24: 40 mg via INTRAVENOUS
  Filled 2014-05-24 (×2): qty 40

## 2014-05-24 MED ORDER — CHLORHEXIDINE GLUCONATE 0.12 % MT SOLN
15.0000 mL | Freq: Two times a day (BID) | OROMUCOSAL | Status: DC
  Administered 2014-05-25 – 2014-05-26 (×3): 15 mL via OROMUCOSAL
  Filled 2014-05-24 (×3): qty 15

## 2014-05-24 MED ORDER — ACETAMINOPHEN 325 MG PO TABS
650.0000 mg | ORAL_TABLET | Freq: Four times a day (QID) | ORAL | Status: DC | PRN
  Administered 2014-05-24: 650 mg via ORAL
  Filled 2014-05-24: qty 2

## 2014-05-24 MED ORDER — CETYLPYRIDINIUM CHLORIDE 0.05 % MT LIQD
7.0000 mL | Freq: Two times a day (BID) | OROMUCOSAL | Status: DC
  Administered 2014-05-25 – 2014-05-26 (×4): 7 mL via OROMUCOSAL

## 2014-05-24 NOTE — Evaluation (Signed)
Physical Therapy Evaluation Patient Details Name: [name] MRN: [id number] DOB: 01-29-1946 Today's Date: 05/24/2014   History of Present Illness  Pt is a 68 yo female from [place] living with altered mental status from AECOPD with acute on chronic hypoxic/hypercapnic respiratory failure.  She had recent admit for cellulitis, respiratory failure, and pericardial effusion s/p window 05/11/14.  She is followed by Dr. 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  Clinical Impression  Pt admitted with the above. Pt currently with functional limitations due to the deficits listed below (see PT Problem List). At the time of PT eval pt had been extubated earlier in the day. Mobility limited to bed>chair only. Pt required close guard for safety and steadying assist during transfers. O2 sats remained 89-95% throughout session. Pt will benefit from skilled PT to increase their independence and safety with mobility to allow discharge to the venue listed below.     Follow Up Recommendations SNF;Supervision/Assistance - 24 hour    Equipment Recommendations  None recommended by PT    Recommendations for Other Services       Precautions / Restrictions Precautions Precautions: Fall Restrictions Weight Bearing Restrictions: No      Mobility  Bed Mobility Overal bed mobility: Needs Assistance Bed Mobility: Supine to Sit     Supine to sit: HOB elevated;Min guard     General bed mobility comments: Pt transitioned to EOB with HOB elevated. Heavy use of bed rails to initiate, however pt scooted around to EOB with ease.   Transfers Overall transfer level: Needs assistance Equipment used: 2 person hand held assist Transfers: Sit to/from [facility] Sit to Stand: Min guard;+2 safety/equipment Stand pivot transfers: Min guard;+2 safety/equipment       General transfer comment: Pt with close guard for safety, but was able to power-up to full standing position with little  assistance. Pt took pivotal steps around to the recliner with +2 assist for safety only.   Ambulation/Gait             General Gait Details: Mobility limited to bed>chair as pt was extubated earlier today.   Stairs            Wheelchair Mobility    Modified Rankin (Stroke Patients Only)       Balance Overall balance assessment: Needs assistance Sitting-balance support: Feet supported;No upper extremity supported Sitting balance-Leahy Scale: Good     Standing balance support: Bilateral upper extremity supported;During functional activity Standing balance-Leahy Scale: Fair                               Pertinent Vitals/Pain Pain Assessment: 0-10 Pain Score: 3  Pain Descriptors / Indicators: Headache Pain Intervention(s): Monitored during session;Patient requesting pain meds-RN notified;RN gave pain meds during session    Home Living Family/patient expects to be discharged to:: Skilled nursing facility Living Arrangements: Alone               Additional Comments: Pt from [facility] and would like to return there at d/c.    Prior Function Level of Independence: Needs assistance   Gait / Transfers Assistance Needed: Was walking with RW with therapy  ADL's / Homemaking Assistance Needed: Pt required assist for ADL's        Hand Dominance   Dominant Hand: Right    Extremity/Trunk Assessment   Upper Extremity Assessment: Defer to OT evaluation  Lower Extremity Assessment: Overall WFL for tasks assessed (4+/5 strength bilaterally)      Cervical / Trunk Assessment: Normal  Communication   Communication: No difficulties  Cognition Arousal/Alertness: Awake/alert Behavior During Therapy: WFL for tasks assessed/performed Overall Cognitive Status: Within Functional Limits for tasks assessed                      General Comments      Exercises        Assessment/Plan    PT Assessment Patient needs  continued PT services  PT Diagnosis Difficulty walking   PT Problem List Decreased strength;Decreased range of motion;Decreased activity tolerance;Decreased balance;Decreased mobility;Decreased knowledge of use of DME;Decreased safety awareness;Decreased knowledge of precautions;Cardiopulmonary status limiting activity  PT Treatment Interventions DME instruction;Gait training;Stair training;Functional mobility training;Therapeutic activities;Therapeutic exercise;Neuromuscular re-education;Patient/family education   PT Goals (Current goals can be found in the Care Plan section) Acute Rehab PT Goals Patient Stated Goal: Return to [facility] Living PT Goal Formulation: With patient Time For Goal Achievement: 05/31/14 Potential to Achieve Goals: Good    Frequency Min 2X/week   Barriers to discharge Decreased caregiver support Lives alone    Co-evaluation               End of Session Equipment Utilized During Treatment: Gait belt;Oxygen Activity Tolerance: Patient tolerated treatment well Patient left: in chair;with call bell/phone within reach;with nursing/sitter in room Nurse Communication: Mobility status         Time: 1400-1430 PT Time Calculation (min) (ACUTE ONLY): 30 min   Charges:   PT Evaluation $Initial PT Evaluation Tier I: 1 Procedure PT Treatments $Gait Training: 8-22 mins $Therapeutic Activity: 8-22 mins   PT G Codes:          Rolinda Roan 05/24/2014, 3:19 PM   Rolinda Roan, PT, DPT Acute Rehabilitation Services Pager: [phone]

## 2014-05-24 NOTE — Procedures (Signed)
Extubation Procedure Note  Patient Details:   Name: [name] DOB: 26-Jun-1945 MRN: [id number]   Airway Documentation:  AIRWAYS 7.5 mm (Active)  Secured at (cm) 21 cm 05/04/2014 12:00 AM    Evaluation  O2 sats: stable throughout Complications: No apparent complications Patient did tolerate procedure well. Bilateral Breath Sounds: Diminished, Clear Suctioning: Airway Yes  4l/min [place] IS 579ml's  Revonda Standard 05/24/2014, 11:39 AM

## 2014-05-24 NOTE — Progress Notes (Signed)
PULMONARY / CRITICAL CARE MEDICINE   Name: [name] MRN: [id number] DOB: 1945-09-15    ADMISSION DATE:  05/20/2014  REFERRING MD :  EDP  CHIEF COMPLAINT:  Respiratory Failure  INITIAL PRESENTATION:   68 yo female from [place] living with altered mental status from AECOPD with acute on chronic hypoxic/hypercapnic respiratory failure.  She had recent admit for cellulitis, respiratory failure, and pericardial effusion s/p window 05/11/14.  She is followed by Dr. 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  PFT's 2012: FEV1 .85, ratio 47%.  STUDIES:   SIGNIFICANT EVENTS: 12/1 - discharged from [facility] to [facility] 12/4 - readmitted for hypoxic respiratory failure   SUBJECTIVE:  Tolerating PSV 5 currently  VITAL SIGNS: Temp:  [98 F (36.7 C)-99 F (37.2 C)] 99 F (37.2 C) (12/08 0800) Pulse Rate:  [56-88] 80 (12/08 0800) Resp:  [15-22] 15 (12/08 0800) BP: (115-163)/(59-84) 163/84 mmHg (12/08 0800) SpO2:  [91 %-99 %] 94 % (12/08 0800) FiO2 (%):  [40 %] 40 % (12/08 0800) Weight:  [84.6 kg (186 lb 8.2 oz)] 84.6 kg (186 lb 8.2 oz) (12/08 0425) HEMODYNAMICS:   VENTILATOR SETTINGS: Vent Mode:  [-] CPAP;PSV FiO2 (%):  [40 %] 40 % Set Rate:  [22 bmp] 22 bmp Vt Set:  [500 mL] 500 mL PEEP:  [5 cmH20] 5 cmH20 Pressure Support:  [5 cmH20] 5 cmH20 Plateau Pressure:  [18 cmH20-22 cmH20] 22 cmH20 INTAKE / OUTPUT: Intake/Output      12/07 0701 - 12/08 0700 12/08 0701 - 12/09 0700   I.V. (mL/kg) 179.3 (2.1) 8.8 (0.1)   NG/GT 1316 90   IV Piggyback 500    Total Intake(mL/kg) 1995.3 (23.6) 98.8 (1.2)   Urine (mL/kg/hr) 660 (0.3) 75 (0.2)   Total Output 660 75   Net +1335.3 +23.8          PHYSICAL EXAMINATION: General: no distress Neuro: RASS 0, follows commands HEENT: ETT in place Cardiovascular: regular Lungs: decreased, coarse breath sounds, no wheeze Abdomen: soft, non tender Musculoskeletal: ankle edema Skin: chest wound site  clean/dry  LABS:  CBC  Recent Labs Lab 05/21/14 0242 05/22/14 0315 05/24/14 0300  WBC 9.4 9.3 7.7  HGB 8.6* 8.2* 7.7*  HCT 27.9* 26.0* 24.8*  PLT 289 290 298   Coag's  Recent Labs Lab 05/20/14 0227  INR 1.02     BMET  Recent Labs Lab 05/21/14 0242 05/22/14 0315 05/24/14 0300  NA 144 143 149*  K 5.2 4.4 4.4  CL 103 102 107  CO2 30 31 31   BUN 31* 36* 48*  CREATININE 1.50* 1.44* 1.28*  GLUCOSE 154* 199* 172*   Electrolytes  Recent Labs Lab 05/21/14 0242  05/22/14 0315 05/22/14 1144 05/22/14 2222 05/24/14 0300  CALCIUM 9.2  --  9.2  --   --  9.4  MG 2.4  < >  --  2.5 2.5 2.4  PHOS 2.3  < >  --  3.3 2.8 2.7  < > = values in this interval not displayed. ABG  Recent Labs Lab 05/20/14 0532 05/22/14 1009 05/23/14 0205  PHART 7.258* 7.336* 7.295*  PCO2ART 69.6* 72.7* 79.2*  PO2ART 55.6* 73.0* 68.0*   Liver Enzymes  Recent Labs Lab 05/20/14 0227  AST 19  ALT 24  ALKPHOS 94  BILITOT 0.2*  ALBUMIN 3.0*    Glucose  Recent Labs Lab 05/23/14 0800 05/23/14 1155 05/23/14 1518 05/23/14 1918 05/24/14 0007 05/24/14 0355  GLUCAP 192* 134* 136* 175* 166* 186*  Imaging Dg Chest Port 1 View  05/24/2014   CLINICAL DATA:  Acute respiratory failure  EXAM: PORTABLE CHEST - 1 VIEW  COMPARISON:  05/23/2014  FINDINGS: The endotracheal tube and nasogastric catheter are again identified in satisfactory position. Aortic calcifications are again seen. Cardiac shadow is stable some obscured by bilateral pleural effusions the bilateral lower lobe infiltrates. The overall appearance is stable from the prior exam. No new focal abnormality is seen. No acute bony abnormality is noted.  IMPRESSION: Bilateral basilar infiltrates and effusions.  Tubes and lines as described.   Electronically Signed   By: Inez Catalina M.D.   On: 05/24/2014 07:41   Dg Chest Port 1 View  05/23/2014   CLINICAL DATA:  Respiratory failure.  EXAM: PORTABLE CHEST - 1 VIEW  COMPARISON:   05/23/2014.  FINDINGS: Endotracheal tube and NG tube in stable position. Cardiomegaly with pulmonary vascular prominence. Bilateral pulmonary infiltrates and pleural effusions noted. The size of consistent with congestive heart failure. Basilar pneumonia cannot be excluded. No pneumothorax. No acute osseus abnormality.  IMPRESSION: 1. Lines and tubes in stable position. 2. Cardiomegaly with pulmonary venous congestion. 3. Bilateral pulmonary alveolar infiltrates and pleural effusions. These findings are consistent with congestive heart failure with pulmonary edema. Bibasilar pneumonia cannot be excluded.   Electronically Signed   By: Marcello Moores  Register   On: 05/23/2014 07:18   Dg Chest Port 1 View  05/23/2014   CLINICAL DATA:  Acute respiratory acidosis, history asthma, COPD, hypertension, diabetes  EXAM: PORTABLE CHEST - 1 VIEW  COMPARISON:  Portable exam 0026 hr compared to 05/22/2014  FINDINGS: Tip of endotracheal tube projects 3.7 cm above carinal.  Nasogastric tube extends into abdomen.  At Enlargement of cardiac silhouette with pulmonary vascular congestion.  Persistent dense consolidation of LEFT lower lobe.  Probable small LEFT pleural effusion.  Hazy density at RIGHT base may represent atelectasis or infiltrate.  Upper lungs clear.  Component of basilar RIGHT pleural effusion not excluded, lung bases incompletely imaged on this exam.  No pneumothorax.  IMPRESSION: Enlargement of cardiac silhouette with pulmonary vascular congestion.  Persistent LEFT lower lobe consolidation with small LEFT pleural effusion and RIGHT basilar atelectasis versus infiltrate.   Electronically Signed   By: Lavonia Dana M.D.   On: 05/23/2014 00:53   Dg Chest Port 1 View  05/22/2014   CLINICAL DATA:  Lump on the right side of neck, shoulder area  EXAM: PORTABLE CHEST - 1 VIEW  COMPARISON:  05/22/2014  FINDINGS: There is left retrocardiac opacity likely representing combination of pleural fluid and atelectasis. There is a small  right pleural effusion. There is bilateral mild interstitial thickening. There is no pneumothorax. There is stable cardiomegaly. There is a nasogastric tube coursing below the diaphragm. There is an endotracheal tube with the tip 6.5 cm above the carina.  IMPRESSION: Overall findings most concerning for mild CHF.   Electronically Signed   By: Kathreen Devoid   On: 05/22/2014 17:49    ASSESSMENT / PLAN:  PULMONARY OETT 12/4  >>> A: Acute on chronic hypercarbic and hypoxic respiratory failure 2nd to AECOPD.  She has multiple intubations in last 1 month. Possible subcut air reported 12/7 > no PTX on CXR P:   Adjust vent to avoid PEEPi Continue solumedrol Brovana/pulmicort/duonebs May need tracheostomy if she fails. Will speak with the patient about overall goals for care once she extubated Follow CXR  CARDIOVASCULAR A:  Hx of HTN, HLD. Cardiomegaly with hx of pericardial effusion s/p pericardial window  11/25 [name]). Hypotension after intubation >> likely from PEEPi and sedation, resolved. Lt leg swelling -Doppler left leg neg 11/17 & 12/5 P:  Hold outpatient hydralazine Continue lovastatin PO  RENAL A:   CKD, stable Hypernatremia P:   Monitor renal fx, urine outpt Add free water if unable to take by mouth after extubation  GASTROINTESTINAL A:   SUP Nutrition P:   Protonix. NPO. Hold TF for possible extubation  HEMATOLOGIC A:   Anemia of chronic disease and critical illness VTE prophylaxis P:  SCD's / Heparin F/u CBC  INFECTIOUS A:   AECOPD Coag neg staph on blood cx P:   zithromax d/c 12/8 vanco started 12/6 > d/c on 12/7  Sputum 12/04 >> negative Blood 12/4 >> GPC clusters 1/2 >> coag negative staph C diff 12/5 >. negative  ENDOCRINE A:   DM II Hx gout P:  CBG's q4hrs. SSI.  NEUROLOGIC A:   Acute metabolic encephalopathy in setting respiratory failure. Hx depression, anxiety, panic attacks. P:   RASS goal: 0. Daily WUA. Continue zoloft,  fentanyl outpatient xanax   Family updated:  Spoke with the patient's son Ceryl by phone 12/7  Interdisciplinary Family Meeting v Palliative Care Meeting:  Due by: 12/11.   SUMMARY: She has recurrent respiratory failure requiring intubation. Goal extubate 12/8 - She has very severe COPD at baseline. May not be able to progress without trach. Will clarify with her whether she would want this type of support  Independent CC time 35 minutes    Baltazar Apo, MD, PhD 05/24/2014, 10:42 AM [facility] [phone] or if no answer [phone]

## 2014-05-24 NOTE — Progress Notes (Signed)
[place] Progress Note Patient Name: [name] DOB: [birth year] MRN: [id number]   Date of Service  05/24/2014  HPI/Events of Note    eICU Interventions  hydralazine for HTN      Intervention Category Intermediate Interventions: Other:  Ulyana Pitones S. 05/24/2014, 6:24 PM

## 2014-05-24 NOTE — Progress Notes (Signed)
15 ml of Fentanyl wasted in sink by [employer] Hanifah Royse, Therapist, sports and Desmond Dike, Therapist, sports.

## 2014-05-24 NOTE — Progress Notes (Signed)
UR Completed.  [phone]  

## 2014-05-24 NOTE — Progress Notes (Signed)
250 mL bag of Fentanyl returned to 2nd floor pharmacy.  Desmond Dike RN

## 2014-05-24 NOTE — Progress Notes (Signed)
[place] Progress Note Patient Name: [name] DOB: 1945-12-08 MRN: [id number]   Date of Service  05/24/2014  HPI/Events of Note  Spoke with the patient by phone this pm. We had begun discussion earlier today regarding goals of care and re-intubation status. She has thought about risks and benefits of being re-intubated and of tracheostomy should it be required. She indicates to me tonight that she would not want to be reintubated in the event of a respiratory decline. I will indicate in the orders that her code status should be DNI. Will work to clarify her other wishes regarding CPR, etc tomorrow.   eICU Interventions       Intervention Category Major Interventions: End of life / care limitation discussion  Meilin Brosh S. 05/24/2014, 9:27 PM

## 2014-05-25 ENCOUNTER — Encounter: Payer: Self-pay | Admitting: Interventional Cardiology

## 2014-05-25 LAB — BASIC METABOLIC PANEL
ANION GAP: 13 (ref 5–15)
BUN: 43 mg/dL — AB (ref 6–23)
CALCIUM: 9.5 mg/dL (ref 8.4–10.5)
CO2: 30 mEq/L (ref 19–32)
Chloride: 103 mEq/L (ref 96–112)
Creatinine, Ser: 1.14 mg/dL — ABNORMAL HIGH (ref 0.50–1.10)
GFR calc Af Amer: 56 mL/min — ABNORMAL LOW (ref >90)
GFR calc non Af Amer: 48 mL/min — ABNORMAL LOW (ref >90)
Glucose, Bld: 116 mg/dL — ABNORMAL HIGH (ref 70–99)
Potassium: 4.3 mEq/L (ref 3.7–5.3)
Sodium: 146 mEq/L (ref 137–147)

## 2014-05-25 LAB — GLUCOSE, CAPILLARY
GLUCOSE-CAPILLARY: 97 mg/dL (ref 70–99)
Glucose-Capillary: 102 mg/dL — ABNORMAL HIGH (ref 70–99)
Glucose-Capillary: 124 mg/dL — ABNORMAL HIGH (ref 70–99)
Glucose-Capillary: 127 mg/dL — ABNORMAL HIGH (ref 70–99)
Glucose-Capillary: 131 mg/dL — ABNORMAL HIGH (ref 70–99)
Glucose-Capillary: 183 mg/dL — ABNORMAL HIGH (ref 70–99)

## 2014-05-25 MED ORDER — WHITE PETROLATUM GEL
Status: AC
  Administered 2014-05-26: 0.2
  Filled 2014-05-25: qty 5

## 2014-05-25 MED ORDER — IPRATROPIUM-ALBUTEROL 0.5-2.5 (3) MG/3ML IN SOLN
3.0000 mL | Freq: Four times a day (QID) | RESPIRATORY_TRACT | Status: DC
  Administered 2014-05-25 – 2014-05-30 (×18): 3 mL via RESPIRATORY_TRACT
  Filled 2014-05-25 (×18): qty 3

## 2014-05-25 NOTE — Progress Notes (Signed)
PULMONARY / CRITICAL CARE MEDICINE   Name: [name] MRN: [id number] DOB: 28-Jul-1945    ADMISSION DATE:  05/20/2014  REFERRING MD :  EDP  CHIEF COMPLAINT:  Respiratory Failure  INITIAL PRESENTATION:   68 yo female from [place] living with altered mental status from AECOPD with acute on chronic hypoxic/hypercapnic respiratory failure.  She had recent admit for cellulitis, respiratory failure, and pericardial effusion s/p window 05/11/14.  She is followed by Dr. 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  PFT's 2012: FEV1 .85, ratio 47%.  STUDIES:   SIGNIFICANT EVENTS: 12/1 - discharged from [facility] to [facility] 12/4 - readmitted for hypoxic respiratory failure  SUBJECTIVE:  Extubated successfully 12/8 Dyspneic with any activity  VITAL SIGNS: Temp:  [97.9 F (36.6 C)-98.7 F (37.1 C)] 97.9 F (36.6 C) (12/09 1226) Pulse Rate:  [70-98] 75 (12/09 1200) Resp:  [10-30] 21 (12/09 1200) BP: (136-175)/(65-116) 154/72 mmHg (12/09 1200) SpO2:  [91 %-98 %] 95 % (12/09 1200) FiO2 (%):  [5 %] 5 % (12/09 0400) HEMODYNAMICS:   VENTILATOR SETTINGS: Vent Mode:  [-]  FiO2 (%):  [5 %] 5 % INTAKE / OUTPUT: Intake/Output      12/08 0701 - 12/09 0700 12/09 0701 - 12/10 0700   P.O. 240    I.V. (mL/kg) 143.8 (1.7) 50 (0.6)   NG/GT 210    IV Piggyback     Total Intake(mL/kg) 593.8 (7) 50 (0.6)   Urine (mL/kg/hr) 875 (0.4) 600 (0.9)   Total Output 875 600   Net -281.3 -550        Urine Occurrence 4 x    Stool Occurrence 2 x      PHYSICAL EXAMINATION: General: no distress Neuro: RASS 0, follows commands HEENT: hoarse voice Cardiovascular: regular Lungs: decreased, coarse breath sounds, no wheeze Abdomen: soft, non tender Musculoskeletal:trace ankle edema Skin: chest wound site clean/dry  LABS:  CBC  Recent Labs Lab 05/21/14 0242 05/22/14 0315 05/24/14 0300  WBC 9.4 9.3 7.7  HGB 8.6* 8.2* 7.7*  HCT 27.9* 26.0* 24.8*  PLT 289 290 298   Coag's  Recent  Labs Lab 05/20/14 0227  INR 1.02     BMET  Recent Labs Lab 05/22/14 0315 05/24/14 0300 05/25/14 0237  NA 143 149* 146  K 4.4 4.4 4.3  CL 102 107 103  CO2 31 31 30   BUN 36* 48* 43*  CREATININE 1.44* 1.28* 1.14*  GLUCOSE 199* 172* 116*   Electrolytes  Recent Labs Lab 05/22/14 0315 05/22/14 1144 05/22/14 2222 05/24/14 0300 05/25/14 0237  CALCIUM 9.2  --   --  9.4 9.5  MG  --  2.5 2.5 2.4  --   PHOS  --  3.3 2.8 2.7  --    ABG  Recent Labs Lab 05/20/14 0532 05/22/14 1009 05/23/14 0205  PHART 7.258* 7.336* 7.295*  PCO2ART 69.6* 72.7* 79.2*  PO2ART 55.6* 73.0* 68.0*   Liver Enzymes  Recent Labs Lab 05/20/14 0227  AST 19  ALT 24  ALKPHOS 94  BILITOT 0.2*  ALBUMIN 3.0*    Glucose  Recent Labs Lab 05/24/14 1248 05/24/14 1602 05/24/14 1903 05/25/14 0023 05/25/14 0403 05/25/14 0759  GLUCAP 139* 124* 138* 131* 102* 124*    Imaging Dg Chest Port 1 View  05/24/2014   CLINICAL DATA:  Acute respiratory failure  EXAM: PORTABLE CHEST - 1 VIEW  COMPARISON:  05/23/2014  FINDINGS: The endotracheal tube and nasogastric catheter are again identified in satisfactory position. Aortic calcifications are  again seen. Cardiac shadow is stable some obscured by bilateral pleural effusions the bilateral lower lobe infiltrates. The overall appearance is stable from the prior exam. No new focal abnormality is seen. No acute bony abnormality is noted.  IMPRESSION: Bilateral basilar infiltrates and effusions.  Tubes and lines as described.   Electronically Signed   By: Inez Catalina M.D.   On: 05/24/2014 07:41    ASSESSMENT / PLAN:  PULMONARY OETT 12/4  >>> 12/8 A: Acute on chronic hypercarbic and hypoxic respiratory failure 2nd to AECOPD.  She has multiple intubations in last 1 month. Possible subcut air reported 12/7 > no PTX on CXR P:   Continue solumedrol, begin to wean on 12/10 Brovana/pulmicort/duonebs Good discussion with the patient on 12/8 and 12/9 regarding  her overall functional capacity, her chronic respiratory failure and her goals for her care. She has thought about the significant potential for her to experience another respiratory decompensation. She tells me that she would not want to be reintubated and that she would not want chronic ventilation or tracheostomy. As such I have indicated in the orders that she should not be intubated. She is still considering whether she would want any form of ACLS. We will revisit this.  Follow CXR  CARDIOVASCULAR A:  Hx of HTN, HLD. Cardiomegaly with hx of pericardial effusion s/p pericardial window 11/25 [name]). Hypotension after intubation >> likely from PEEPi and sedation, resolved. Lt leg swelling -Doppler left leg neg 11/17 & 12/5 P:  Hold outpatient hydralazine Continue lovastatin PO  RENAL A:   CKD, stable Hypernatremia P:   Monitor renal fx, urine outpt  GASTROINTESTINAL A:   SUP Nutrition P:   Protonix.  HEMATOLOGIC A:   Anemia of chronic disease and critical illness VTE prophylaxis P:  Heparin F/u CBC  INFECTIOUS A:   AECOPD Coag neg staph on blood cx P:   zithromax d/c 12/8 vanco started 12/6 > d/c on 12/7  Sputum 12/04 >> negative Blood 12/4 >> GPC clusters 1/2 >> coag negative staph C diff 12/5 >. negative  ENDOCRINE A:   DM II Hx gout P:  CBG's qac+ hs SSI.  NEUROLOGIC A:   Acute metabolic encephalopathy in setting respiratory failure. Hx depression, anxiety, panic attacks. P:   RASS goal: 0. Continue zoloft, fentanyl outpatient xanax   Family updated:  Spoke with the patient's son Ceryl by phone 12/7, Clarify goals of care with patient on 12/8 and 12/9  Interdisciplinary Family Meeting v Palliative Care Meeting:  Due by: 12/11.   SUMMARY: She has recurrent respiratory failure requiring intubation. Extubated 12/8 - She has very severe COPD at baseline. She understands that she may not stabilize. We've clarified that although she would  want all medical care to improve her breathing and prolong her life, she would not want reintubation or tracheostomy.     Baltazar Apo, MD, PhD 05/25/2014, 2:40 PM [facility] [phone] or if no answer [phone]

## 2014-05-26 LAB — GLUCOSE, CAPILLARY
GLUCOSE-CAPILLARY: 123 mg/dL — AB (ref 70–99)
GLUCOSE-CAPILLARY: 124 mg/dL — AB (ref 70–99)
GLUCOSE-CAPILLARY: 124 mg/dL — AB (ref 70–99)
GLUCOSE-CAPILLARY: 162 mg/dL — AB (ref 70–99)
Glucose-Capillary: 184 mg/dL — ABNORMAL HIGH (ref 70–99)
Glucose-Capillary: 159 mg/dL — ABNORMAL HIGH (ref 70–99)
Glucose-Capillary: 159 mg/dL — ABNORMAL HIGH (ref 70–99)

## 2014-05-26 LAB — CULTURE, BLOOD (ROUTINE X 2): Culture: NO GROWTH

## 2014-05-26 MED ORDER — GLUCERNA SHAKE PO LIQD
237.0000 mL | Freq: Three times a day (TID) | ORAL | Status: DC
  Administered 2014-05-26 – 2014-05-28 (×8): 237 mL via ORAL

## 2014-05-26 MED ORDER — INSULIN ASPART 100 UNIT/ML ~~LOC~~ SOLN
0.0000 [IU] | Freq: Three times a day (TID) | SUBCUTANEOUS | Status: DC
  Administered 2014-05-26: 2 [IU] via SUBCUTANEOUS
  Administered 2014-05-27 (×2): 3 [IU] via SUBCUTANEOUS
  Administered 2014-05-27 – 2014-05-29 (×4): 2 [IU] via SUBCUTANEOUS
  Administered 2014-05-29: 5 [IU] via SUBCUTANEOUS
  Administered 2014-05-30: 3 [IU] via SUBCUTANEOUS

## 2014-05-26 MED ORDER — INSULIN ASPART 100 UNIT/ML ~~LOC~~ SOLN: 0.0000 [IU] | Freq: Every day | SUBCUTANEOUS | Status: DC

## 2014-05-26 NOTE — Progress Notes (Signed)
PT Cancellation Note  Patient Details Name: MAEGEN WIGLE MRN: [id number] DOB: [birth year]   Cancelled Treatment:    Reason Eval/Treat Not Completed: Patient declined, no reason specified  Patient reports she cannot work with therapy today as she is too fatigued. Explained importance of being mobile and out of bed as much as she can tolerate to prevent secondary complications. States she will receive therapy at SNF. Will follow up as time allows.  [place], [place]  Candie Mile S 05/26/2014, 11:57 AM

## 2014-05-26 NOTE — Clinical Social Work Note (Signed)
Clinical Social Worker submitted updated FL-2 and other clinical documents to [facility]. Patient will return to [facility] once medically stable.   Per report in progression, patient has unit stepdown orders. CSW will continue to follow patient and pt's family for disposition needs.    Glendon Axe, MSW, LCSWA [phone] 05/26/2014 2:52 PM

## 2014-05-26 NOTE — Progress Notes (Signed)
NUTRITION FOLLOW UP  Intervention:    Glucerna Shake PO TID, each supplement provides 220 kcal and 10 grams of protein  Nutrition Dx:   Inadequate oral intake related to poor appetite and difficulty breathing as evidenced by minimal intake of meals.  Goal:   Intake to meet >90% of estimated nutrition needs. Unmet.  Monitor:   PO intake, labs, weight trend.  Assessment:   68 yo female from [place] living with altered mental status from AECOPD with acute on chronic hypoxic/hypercapnic respiratory failure.Recent admit for cellulitis, respiratory failure, and pericardial effusion s/p window 05/11/14.  Patient was extubated on 12/8. Diet has been advanced to heart healthy. Patient reports that she has been eating minimally since extubation. She is coughing a lot and is only able to sleep for ~15 minutes at a time. She is very tired. Agreed to try a PO supplement to help maximize oral intake.  Height: Ht Readings from Last 1 Encounters:  05/20/14 5\' 6"  (1.676 m)    Weight Status:   Wt Readings from Last 1 Encounters:  05/26/14 184 lb 8.4 oz (83.7 kg)   05/20/14 181 lb 14.1 oz (82.5 kg)    Re-estimated needs:  Kcal: 1500-1700 Protein: 85-100 gm Fluid: 1.7 L  Skin: no issues  Diet Order: Diet Heart   Intake/Output Summary (Last 24 hours) at 05/26/14 1045 Last data filed at 05/26/14 0500  Gross per 24 hour  Intake    180 ml  Output   1450 ml  Net  -1270 ml    Last BM: 12/8   Labs:   Recent Labs Lab 05/22/14 0315 05/22/14 1144 05/22/14 2222 05/24/14 0300 05/25/14 0237  NA 143  --   --  149* 146  K 4.4  --   --  4.4 4.3  CL 102  --   --  107 103  CO2 31  --   --  31 30  BUN 36*  --   --  48* 43*  CREATININE 1.44*  --   --  1.28* 1.14*  CALCIUM 9.2  --   --  9.4 9.5  MG  --  2.5 2.5 2.4  --   PHOS  --  3.3 2.8 2.7  --   GLUCOSE 199*  --   --  172* 116*    CBG (last 3)   Recent Labs  05/26/14 0011 05/26/14 0430 05/26/14 0817  GLUCAP 124* 123*  184*    Scheduled Meds: . antiseptic oral rinse  7 mL Mouth Rinse q12n4p  . arformoterol  15 mcg Nebulization BID  . budesonide  0.5 mg Nebulization BID  . chlorhexidine  15 mL Mouth Rinse BID  . heparin  5,000 Units Subcutaneous 3 times per day  . insulin aspart  0-15 Units Subcutaneous 6 times per day  . ipratropium-albuterol  3 mL Nebulization QID  . methylPREDNISolone (SOLU-MEDROL) injection  40 mg Intravenous 3 times per day  . sertraline  50 mg Oral Daily    Continuous Infusions: None   Molli Barrows, RD, LDN, [place] Pager [phone] After Hours Pager [phone]

## 2014-05-26 NOTE — Progress Notes (Signed)
PULMONARY / CRITICAL CARE MEDICINE   Name: [name] MRN: [id number] DOB: 11-13-1945    ADMISSION DATE:  05/20/2014  REFERRING MD :  EDP  CHIEF COMPLAINT:  Respiratory Failure  INITIAL PRESENTATION:   68 yo female from [place] living with altered mental status from AECOPD with acute on chronic hypoxic/hypercapnic respiratory failure.  She had recent admit for cellulitis, respiratory failure, and pericardial effusion s/p window 05/11/14.  She is followed by Dr. 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  PFT's 2012: FEV1 .85, ratio 47%.  STUDIES:   SIGNIFICANT EVENTS: 12/1 - discharged from [facility] to [facility] 12/4 - readmitted for hypoxic respiratory failure  SUBJECTIVE:  Dyspneic with any activity  VITAL SIGNS: Temp:  [97.9 F (36.6 C)-98.9 F (37.2 C)] 98.9 F (37.2 C) (12/10 1204) Pulse Rate:  [72-114] 85 (12/10 0900) Resp:  [15-27] 20 (12/10 0900) BP: (141-190)/(71-109) 157/79 mmHg (12/10 0900) SpO2:  [89 %-100 %] 99 % (12/10 0935) FiO2 (%):  [40 %] 40 % (12/10 0935) Weight:  [83.7 kg (184 lb 8.4 oz)] 83.7 kg (184 lb 8.4 oz) (12/10 0447) HEMODYNAMICS:   VENTILATOR SETTINGS: Vent Mode:  [-]  FiO2 (%):  [40 %] 40 % INTAKE / OUTPUT: Intake/Output      12/09 0701 - 12/10 0700 12/10 0701 - 12/11 0700   P.O.     I.V. (mL/kg) 210 (2.5)    NG/GT     Total Intake(mL/kg) 210 (2.5)    Urine (mL/kg/hr) 1750 (0.9)    Total Output 1750     Net -1540          Urine Occurrence  1 x   Stool Occurrence  1 x     PHYSICAL EXAMINATION: General: no distress Neuro: RASS 0, follows commands HEENT: hoarse voice Cardiovascular: regular Lungs: decreased, coarse breath sounds, no wheeze Abdomen: soft, non tender Musculoskeletal:trace ankle edema Skin: chest wound site clean/dry  LABS:  CBC  Recent Labs Lab 05/21/14 0242 05/22/14 0315 05/24/14 0300  WBC 9.4 9.3 7.7  HGB 8.6* 8.2* 7.7*  HCT 27.9* 26.0* 24.8*  PLT 289 290 298   Coag's  Recent  Labs Lab 05/20/14 0227  INR 1.02     BMET  Recent Labs Lab 05/22/14 0315 05/24/14 0300 05/25/14 0237  NA 143 149* 146  K 4.4 4.4 4.3  CL 102 107 103  CO2 31 31 30   BUN 36* 48* 43*  CREATININE 1.44* 1.28* 1.14*  GLUCOSE 199* 172* 116*   Electrolytes  Recent Labs Lab 05/22/14 0315 05/22/14 1144 05/22/14 2222 05/24/14 0300 05/25/14 0237  CALCIUM 9.2  --   --  9.4 9.5  MG  --  2.5 2.5 2.4  --   PHOS  --  3.3 2.8 2.7  --    ABG  Recent Labs Lab 05/20/14 0532 05/22/14 1009 05/23/14 0205  PHART 7.258* 7.336* 7.295*  PCO2ART 69.6* 72.7* 79.2*  PO2ART 55.6* 73.0* 68.0*   Liver Enzymes  Recent Labs Lab 05/20/14 0227  AST 19  ALT 24  ALKPHOS 94  BILITOT 0.2*  ALBUMIN 3.0*    Glucose  Recent Labs Lab 05/25/14 1225 05/25/14 1545 05/25/14 1912 05/26/14 0011 05/26/14 0430 05/26/14 0817  GLUCAP 127* 97 183* 124* 123* 184*    Imaging No results found.  ASSESSMENT / PLAN:  PULMONARY OETT 12/4  >>> 12/8 A: Acute on chronic hypercarbic and hypoxic respiratory failure 2nd to AECOPD.  She has multiple intubations in last 1 month. Possible subcut  air reported 12/7 > no PTX on CXR P:   Continue solumedrol, wean on 12/10 Brovana/pulmicort/duonebs Good discussion with the patient on 12/8 and 12/9 regarding her overall functional capacity, her chronic respiratory failure and her goals for her care. She has thought about the significant potential for her to experience another respiratory decompensation. She tells me that she would not want to be reintubated and that she would not want chronic ventilation, CPR or tracheostomy.  Follow CXR  CARDIOVASCULAR A:  Hx of HTN, HLD. Cardiomegaly with hx of pericardial effusion s/p pericardial window 11/25 [name]). Hypotension after intubation >> likely from PEEPi and sedation, resolved. Lt leg swelling -Doppler left leg neg 11/17 & 12/5 P:  Hold outpatient hydralazine Restart lovastatin at  discharge  RENAL A:   CKD, stable Hypernatremia P:   Monitor renal fx, urine outpt  GASTROINTESTINAL A:   SUP Nutrition P:   Protonix.  HEMATOLOGIC A:   Anemia of chronic disease and critical illness VTE prophylaxis P:  Heparin sq F/u CBC  INFECTIOUS A:   AECOPD Coag neg staph on blood cx P:   zithromax d/c 12/8 vanco started 12/6 > d/c on 12/7  Sputum 12/04 >> negative Blood 12/4 >> GPC clusters 1/2 >> coag negative staph C diff 12/5 >. negative  ENDOCRINE A:   DM II Hx gout P:  CBG's qac+ hs SSI.  NEUROLOGIC A:   Acute metabolic encephalopathy in setting respiratory failure. Hx depression, anxiety, panic attacks. P:   RASS goal: 0. Continue zoloft, fentanyl outpatient xanax   Family updated:  Spoke with the patient's son Ceryl by phone 12/7, Clarified goals of care with patient on 12/8 and 12/9  Interdisciplinary Family Meeting v Palliative Care Meeting:  Due by: 12/11.   SUMMARY: She has recurrent respiratory failure requiring intubation. Extubated 12/8 - She has very severe COPD at baseline. She understands that she may not stabilize. We've clarified that although she would want all medical care to improve her breathing and prolong her life, she would not want reintubation, CPR or tracheostomy.     Baltazar Apo, MD, PhD 05/26/2014, 1:34 PM [facility] [phone] or if no answer [phone]

## 2014-05-27 ENCOUNTER — Encounter (HOSPITAL_COMMUNITY): Payer: Self-pay | Admitting: *Deleted

## 2014-05-27 ENCOUNTER — Other Ambulatory Visit: Payer: Self-pay | Admitting: Thoracic Surgery (Cardiothoracic Vascular Surgery)

## 2014-05-27 DIAGNOSIS — I313 Pericardial effusion (noninflammatory): Secondary | ICD-10-CM

## 2014-05-27 DIAGNOSIS — I3139 Other pericardial effusion (noninflammatory): Secondary | ICD-10-CM

## 2014-05-27 LAB — GLUCOSE, CAPILLARY
GLUCOSE-CAPILLARY: 138 mg/dL — AB (ref 70–99)
Glucose-Capillary: 178 mg/dL — ABNORMAL HIGH (ref 70–99)
Glucose-Capillary: 168 mg/dL — ABNORMAL HIGH (ref 70–99)
Glucose-Capillary: 154 mg/dL — ABNORMAL HIGH (ref 70–99)

## 2014-05-27 MED ORDER — PREDNISONE 20 MG PO TABS
30.0000 mg | ORAL_TABLET | Freq: Every day | ORAL | Status: DC
  Administered 2014-05-27 – 2014-05-29 (×3): 30 mg via ORAL
  Filled 2014-05-27 (×8): qty 1

## 2014-05-27 MED ORDER — PRAVASTATIN SODIUM 40 MG PO TABS
40.0000 mg | ORAL_TABLET | Freq: Every day | ORAL | Status: DC
  Administered 2014-05-27 – 2014-05-29 (×3): 40 mg via ORAL
  Filled 2014-05-27 (×4): qty 1

## 2014-05-27 MED ORDER — HYDRALAZINE HCL 25 MG PO TABS
25.0000 mg | ORAL_TABLET | Freq: Three times a day (TID) | ORAL | Status: DC
  Administered 2014-05-27 – 2014-05-30 (×10): 25 mg via ORAL
  Filled 2014-05-27 (×12): qty 1

## 2014-05-27 MED ORDER — DM-GUAIFENESIN ER 30-600 MG PO TB12
1.0000 | ORAL_TABLET | Freq: Two times a day (BID) | ORAL | Status: DC
  Administered 2014-05-27 – 2014-05-30 (×7): 1 via ORAL
  Filled 2014-05-27 (×9): qty 1

## 2014-05-27 NOTE — Progress Notes (Signed)
Physical Therapy Treatment Patient Details Name: [name] MRN: [id number] DOB: 07-29-1945 Today's Date: 05/27/2014    History of Present Illness 68 yo female from [place] living with altered mental status from AECOPD with acute on chronic hypoxic/hypercapnic respiratory failure.  She had recent admit for cellulitis, respiratory failure, and pericardial effusion s/p window 05/11/14.  She is followed by Dr. 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    PT Comments    Patient progressing and able to ambulate this session. Patient is limited by drop in O2 and SOB. Will continue to work with patient to increase activity tolerance  Follow Up Recommendations  SNF;Supervision/Assistance - 24 hour     Equipment Recommendations  None recommended by PT    Recommendations for Other Services       Precautions / Restrictions Precautions Precautions: Fall    Mobility  Bed Mobility               General bed mobility comments: Patient up in recliner upon entering room  Transfers Overall transfer level: Needs assistance   Transfers: Sit to/from Stand Sit to Stand: Min guard         General transfer comment: Pt with close guard for safety, but was able to power-up to full standing position without physical assist.   Ambulation/Gait Ambulation/Gait assistance: Min assist Ambulation Distance (Feet): 80 Feet Assistive device: Rolling walker (2 wheeled) Gait Pattern/deviations: Step-through pattern;Decreased stride length   Gait velocity interpretation: Below normal speed for age/gender General Gait Details: Min A to ensure safety and balance with use of RW. Patient limited due to dropping O2 to 74% on 3L. Patient return to sitting and after a few minutes rest patient recovered to 86%   Stairs            Wheelchair Mobility    Modified Rankin (Stroke Patients Only)       Balance                                    Cognition  Arousal/Alertness: Awake/alert Behavior During Therapy: WFL for tasks assessed/performed Overall Cognitive Status: Within Functional Limits for tasks assessed                      Exercises      General Comments        Pertinent Vitals/Pain Pain Assessment: No/denies pain    Home Living                      Prior Function            PT Goals (current goals can now be found in the care plan section) Progress towards PT goals: Progressing toward goals    Frequency  Min 2X/week    PT Plan Current plan remains appropriate    Co-evaluation             End of Session Equipment Utilized During Treatment: Gait belt;Oxygen Activity Tolerance: Patient tolerated treatment well Patient left: in chair;with call bell/phone within reach     Time: 1050-1108 PT Time Calculation (min) (ACUTE ONLY): 18 min  Charges:  $Gait Training: 8-22 mins                    G Codes:      Jacqualyn Posey 05/27/2014, 1:27 PM 05/27/2014 Jacqualyn Posey PTA [phone] pager [phone] office

## 2014-05-27 NOTE — Progress Notes (Signed)
PULMONARY / CRITICAL CARE MEDICINE   Name: [name] MRN: [id number] DOB: 1946/03/19    ADMISSION DATE:  05/20/2014  REFERRING MD :  EDP  CHIEF COMPLAINT:  Respiratory Failure  INITIAL PRESENTATION:   68 yo female from [place] living with altered mental status from AECOPD with acute on chronic hypoxic/hypercapnic respiratory failure.  She had recent admit for cellulitis, respiratory failure, and pericardial effusion s/p window 05/11/14.  She is followed by Dr. 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  PFT's 2012: FEV1 .85, ratio 47%.  STUDIES:   SIGNIFICANT EVENTS: 12/01 - discharged from [facility] to [facility] 12/04 - readmitted for hypoxic respiratory failure 12/08 - extubated, DNI established 12/10 - transfer to floor 12/11 - consult palliative care to assess for outpt hospice  SUBJECTIVE:  C/o productive cough that wakes her up at night.  Otherwise feels better.  VITAL SIGNS: Temp:  [97.8 F (36.6 C)-99.4 F (37.4 C)] 97.8 F (36.6 C) (12/11 0639) Pulse Rate:  [78-93] 82 (12/11 0639) Resp:  [18-26] 18 (12/11 0639) BP: (147-166)/(77-87) 153/83 mmHg (12/11 0639) SpO2:  [95 %-99 %] 95 % (12/11 0639) FiO2 (%):  [40 %] 40 % (12/10 1343) Weight:  [184 lb 14.4 oz (83.87 kg)] 184 lb 14.4 oz (83.87 kg) (12/11 0800) INTAKE / OUTPUT: Intake/Output      12/10 0701 - 12/11 0700 12/11 0701 - 12/12 0700   I.V. (mL/kg)     Total Intake(mL/kg)     Urine (mL/kg/hr) 300 (0.1)    Total Output 300     Net -300          Urine Occurrence 2 x    Stool Occurrence 2 x      PHYSICAL EXAMINATION: General: pleasant, sitting in chair Neuro: normal strength HEENT: raspy voice Cardiovascular: regular Lungs: decreased breath sounds, no wheeze Abdomen: soft, non tender Musculoskeletal: no edema Skin: no rashes  LABS:  CBC  Recent Labs Lab 05/21/14 0242 05/22/14 0315 05/24/14 0300  WBC 9.4 9.3 7.7  HGB 8.6* 8.2* 7.7*  HCT 27.9* 26.0* 24.8*  PLT 289 290 298    BMET  Recent Labs Lab 05/22/14 0315 05/24/14 0300 05/25/14 0237  NA 143 149* 146  K 4.4 4.4 4.3  CL 102 107 103  CO2 31 31 30   BUN 36* 48* 43*  CREATININE 1.44* 1.28* 1.14*  GLUCOSE 199* 172* 116*   Electrolytes  Recent Labs Lab 05/22/14 0315 05/22/14 1144 05/22/14 2222 05/24/14 0300 05/25/14 0237  CALCIUM 9.2  --   --  9.4 9.5  MG  --  2.5 2.5 2.4  --   PHOS  --  3.3 2.8 2.7  --    ABG  Recent Labs Lab 05/22/14 1009 05/23/14 0205  PHART 7.336* 7.295*  PCO2ART 72.7* 79.2*  PO2ART 73.0* 68.0*   Glucose  Recent Labs Lab 05/26/14 0817 05/26/14 1202 05/26/14 1527 05/26/14 1808 05/26/14 2221 05/27/14 0743  GLUCAP 184* 159* 159* 124* 162* 138*    Imaging No results found.  ASSESSMENT / PLAN:  ETT 12/4  >>> 12/8 A: Acute on chronic hypercarbic and hypoxic respiratory failure 2nd to AECOPD >> completed Abx 12/08.  She has multiple intubations in last 1 month.  DNI established. P:   Change to prednisone 12/11 Continue brovana/pulmicort and scheduled duonebs Oxygen to keep SpO2 88 to 94% F/u CXR as needed Mucinex  A:  Hx of HTN, HLD. Cardiomegaly with hx of pericardial effusion s/p pericardial window 11/25 [name]). Hypotension after  intubation >> likely from PEEPi and sedation, resolved. Lt leg swelling -Doppler left leg neg 11/17 & 12/5 P:  Hydralazine, pravastatin  A:   CKD, stable Hypernatremia >> resolved. P:   Monitor renal fx, urine outpt  A:   Anemia of chronic disease and critical illness VTE prophylaxis P:  Heparin F/u CBC intermittently  A:   DM II with renal complications. P:  SSI  A:   Acute metabolic encephalopathy in setting respiratory failure >> resolved. Hx depression, anxiety, panic attacks. Deconditioning. P:   Continue zoloft, fentanyl outpatient xanax PT >> will need SNF placement prior to transitioning home  Goals of care >> DNR/DNI.  SUMMARY: I had further discussion with her about goals of  care.  She is clear that she would not want to be re-intubated, and is okay with passing if "it is her time".  She has decided for full DNR status.  She would also be interested in learning more about option for home hospice.  Will consult palliative care team to discuss this in more detail.  Courtney Mires, MD [facility] Pulmonary/Critical Care 05/27/2014, 10:05 AM Pager:  [phone] After 3pm call: [phone]

## 2014-05-28 DIAGNOSIS — M7989 Other specified soft tissue disorders: Secondary | ICD-10-CM

## 2014-05-28 DIAGNOSIS — J9621 Acute and chronic respiratory failure with hypoxia: Secondary | ICD-10-CM

## 2014-05-28 LAB — GLUCOSE, CAPILLARY
GLUCOSE-CAPILLARY: 145 mg/dL — AB (ref 70–99)
GLUCOSE-CAPILLARY: 82 mg/dL (ref 70–99)
Glucose-Capillary: 99 mg/dL (ref 70–99)
Glucose-Capillary: 148 mg/dL — ABNORMAL HIGH (ref 70–99)

## 2014-05-28 NOTE — Progress Notes (Signed)
PULMONARY / CRITICAL CARE MEDICINE   Name: [name] MRN: [id number] DOB: 1946-06-02    ADMISSION DATE:  05/20/2014  REFERRING MD :  EDP  CHIEF COMPLAINT:  Respiratory Failure  INITIAL PRESENTATION:   68 yo female from [place] living with altered mental status from AECOPD with acute on chronic hypoxic/hypercapnic respiratory failure.  She had recent admit for cellulitis, respiratory failure, and pericardial effusion s/p window 05/11/14.  She is followed by Dr. 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  PFT's 2012: FEV1 .85, ratio 47%.  STUDIES:   SIGNIFICANT EVENTS: 12/01 - discharged from [facility] to [facility] 12/04 - readmitted for hypoxic respiratory failure 12/08 - extubated, DNI established 12/10 - transfer to floor 12/11 - consult palliative care to assess for outpt hospice  SUBJECTIVE:  Hoarse chronically. Denies acute discomfort. Awaiting Palliative Care  VITAL SIGNS: Temp:  [98.1 F (36.7 C)-98.5 F (36.9 C)] 98.5 F (36.9 C) (12/12 0500) Pulse Rate:  [85-93] 88 (12/12 0500) Resp:  [18-20] 20 (12/12 0500) BP: (132-168)/(87-96) 132/91 mmHg (12/12 0500) SpO2:  [97 %] 97 % (12/12 0500) Weight:  [85.186 kg (187 lb 12.8 oz)] 85.186 kg (187 lb 12.8 oz) (12/12 0500) INTAKE / OUTPUT: Intake/Output      12/11 0701 - 12/12 0700 12/12 0701 - 12/13 0700   P.O. 240    Total Intake(mL/kg) 240 (2.8)    Urine (mL/kg/hr) 1000 (0.5)    Total Output 1000     Net -760          Urine Occurrence 4 x    Stool Occurrence 1 x      PHYSICAL EXAMINATION: General: pleasant, sitting in bed Neuro: normal strength HEENT: raspy voice, no stridor Cardiovascular: regular Lungs: decreased breath sounds, no wheeze. Unlabored Abdomen: soft, non tender Musculoskeletal: no edema Skin: no rashes  LABS:  CBC  Recent Labs Lab 05/22/14 0315 05/24/14 0300  WBC 9.3 7.7  HGB 8.2* 7.7*  HCT 26.0* 24.8*  PLT 290 298   BMET  Recent Labs Lab 05/22/14 0315 05/24/14 0300  05/25/14 0237  NA 143 149* 146  K 4.4 4.4 4.3  CL 102 107 103  CO2 31 31 30   BUN 36* 48* 43*  CREATININE 1.44* 1.28* 1.14*  GLUCOSE 199* 172* 116*   Electrolytes  Recent Labs Lab 05/22/14 0315 05/22/14 1144 05/22/14 2222 05/24/14 0300 05/25/14 0237  CALCIUM 9.2  --   --  9.4 9.5  MG  --  2.5 2.5 2.4  --   PHOS  --  3.3 2.8 2.7  --    ABG  Recent Labs Lab 05/22/14 1009 05/23/14 0205  PHART 7.336* 7.295*  PCO2ART 72.7* 79.2*  PO2ART 73.0* 68.0*   Glucose  Recent Labs Lab 05/26/14 2221 05/27/14 0743 05/27/14 1219 05/27/14 1652 05/27/14 2141 05/28/14 0729  GLUCAP 162* 138* 178* 168* 154* 99    Imaging No results found.  ASSESSMENT / PLAN:  ETT 12/4  >>> 12/8 A: Acute on chronic hypercarbic and hypoxic respiratory failure 2nd to AECOPD >> completed Abx 12/08.  She has multiple intubations in last 1 month.  DNI established. P:   Change to prednisone 12/11, taper to 20 mg daily as of 12/12 Continue brovana/pulmicort and scheduled duonebs Oxygen to keep SpO2 88 to 94% F/u CXR as needed Mucinex  A:  Hx of HTN, HLD. Cardiomegaly with hx of pericardial effusion s/p pericardial window 11/25 [name]). Hypotension after intubation >> likely from PEEPi and sedation, resolved. Lt leg  swelling -Doppler left leg neg 11/17 & 12/5 P:  Hydralazine, pravastatin  A:   CKD, stable Hypernatremia >> resolved. P:   Monitor renal fx, urine outpt  A:   Anemia of chronic disease and critical illness VTE prophylaxis P:  Heparin F/u CBC intermittently  A:   DM II with renal complications. P:  SSI  A:   Acute metabolic encephalopathy in setting respiratory failure >> resolved. Hx depression, anxiety, panic attacks. Deconditioning. P:   Continue zoloft, fentanyl outpatient xanax PT >> will need SNF placement prior to transitioning home  Goals of care >> DNR/DNI.  SUMMARY: 12/11-had further discussion with her about goals of care.  She is clear  that she would not want to be re-intubated, and is okay with passing if "it is her time".  She has decided for full DNR status.  She would also be interested in learning more about option for home hospice.  Will consult palliative care team to discuss this in more detail. 12/12- She is awaiting Palliative Care Consult, which has been requested.  CD Annamaria Boots, MD [facility] Pulmonary/Critical Care 05/28/2014, 8:43 AM Pager:  [phone]  m [phone] After 3pm call: [phone]

## 2014-05-28 NOTE — Consult Note (Signed)
Asked to see for home hospice options. This is actually handled through case management and does not need formal palliative care consultation. Certainly if questions arise, I am always happy to assist.  It seems as if Ernestine wants to try short term rehab prior to going home. [facility] may be a good choice for her as they would be able to follow in palliative care role at rehab and helps smooth transition to home hospice care.  If she declined at rehab, they could also transition her to inpatient hospice facility as she desires not to come back to hospital and face re-intubation.  She lives by herself at home and knows she needs to plan some for when she is no longer able to care for herself.  With her ES-COPD, increasing frequency of admissions, recent resp failure with intubation, I agree that she would qualify for home hospice care.  If possible, she wonders if a hospice liaison could possibly describe to her more specifically what services would be provided at home (we did talk about these in general).  Please feel free to contact me if other questions arise.   Doran Clay D.O. Palliative Medicine Team at [facility]  Pager: [phone] Team Phone: [phone]

## 2014-05-29 DIAGNOSIS — J9620 Acute and chronic respiratory failure, unspecified whether with hypoxia or hypercapnia: Secondary | ICD-10-CM

## 2014-05-29 LAB — GLUCOSE, CAPILLARY
GLUCOSE-CAPILLARY: 96 mg/dL (ref 70–99)
GLUCOSE-CAPILLARY: 233 mg/dL — AB (ref 70–99)
Glucose-Capillary: 147 mg/dL — ABNORMAL HIGH (ref 70–99)
Glucose-Capillary: 131 mg/dL — ABNORMAL HIGH (ref 70–99)

## 2014-05-29 MED ORDER — PREDNISONE 20 MG PO TABS
20.0000 mg | ORAL_TABLET | Freq: Every day | ORAL | Status: DC
  Administered 2014-05-30: 20 mg via ORAL
  Filled 2014-05-29: qty 1

## 2014-05-29 NOTE — Progress Notes (Signed)
PULMONARY / CRITICAL CARE MEDICINE   Name: [name] MRN: [id number] DOB: 1945/11/28    ADMISSION DATE:  05/20/2014  REFERRING MD :  EDP  CHIEF COMPLAINT:  Respiratory Failure  INITIAL PRESENTATION:   68 yo female from [place] living with altered mental status from AECOPD with acute on chronic hypoxic/hypercapnic respiratory failure.  She had recent admit for cellulitis, respiratory failure, and pericardial effusion s/p window 05/11/14.  She is followed by Dr. 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  PFT's 2012: FEV1 .85, ratio 47%.  STUDIES:   SIGNIFICANT EVENTS: 12/01 - discharged from [facility] to [facility] 12/04 - readmitted for hypoxic respiratory failure 12/08 - extubated, DNI established 12/10 - transfer to floor 12/11 - consult palliative care to assess for outpt hospice  SUBJECTIVE:  Less hoarse. SOB after squirming in bed to position for breakfast tray. No new concerns voiced  VITAL SIGNS: Temp:  [98.2 F (36.8 C)-98.6 F (37 C)] 98.6 F (37 C) (12/13 0455) Pulse Rate:  [78-90] 78 (12/13 0455) Resp:  [19-20] 19 (12/13 0455) BP: (151-161)/(80-81) 156/81 mmHg (12/13 0455) SpO2:  [92 %-99 %] 97 % (12/13 0814) Weight:  [84.278 kg (185 lb 12.8 oz)] 84.278 kg (185 lb 12.8 oz) (12/13 0455) INTAKE / OUTPUT: Intake/Output      12/12 0701 - 12/13 0700 12/13 0701 - 12/14 0700   P.O. 460    Total Intake(mL/kg) 460 (5.5)    Urine (mL/kg/hr) 2500 (1.2)    Total Output 2500     Net -2040          Stool Occurrence 2 x      PHYSICAL EXAMINATION: General: pleasant, sitting in bed Neuro: normal strength HEENT: raspy voice, no stridor Cardiovascular: regular Lungs: decreased breath sounds, no wheeze. Unlabored Abdomen: soft, non tender Musculoskeletal: no edema Skin: no rashes  LABS:  CBC  Recent Labs Lab 05/24/14 0300  WBC 7.7  HGB 7.7*  HCT 24.8*  PLT 298   BMET  Recent Labs Lab 05/24/14 0300 05/25/14 0237  NA 149* 146  K 4.4 4.3  CL  107 103  CO2 31 30  BUN 48* 43*  CREATININE 1.28* 1.14*  GLUCOSE 172* 116*   Electrolytes  Recent Labs Lab 05/22/14 1144 05/22/14 2222 05/24/14 0300 05/25/14 0237  CALCIUM  --   --  9.4 9.5  MG 2.5 2.5 2.4  --   PHOS 3.3 2.8 2.7  --    ABG  Recent Labs Lab 05/22/14 1009 05/23/14 0205  PHART 7.336* 7.295*  PCO2ART 72.7* 79.2*  PO2ART 73.0* 68.0*   Glucose  Recent Labs Lab 05/27/14 2141 05/28/14 0729 05/28/14 1230 05/28/14 1754 05/28/14 2135 05/29/14 0736  GLUCAP 154* 99 145* 148* 82 96    Imaging No results found.  ASSESSMENT / PLAN:  ETT 12/4  >>> 12/8 A: Acute on chronic hypercarbic and hypoxic respiratory failure 2nd to AECOPD >> completed Abx 12/08.  She has multiple intubations in last 1 month.  DNI established. P:   Change to prednisone 12/11, tapered to 20 mg daily as of 12/13 Continue brovana/pulmicort and scheduled duonebs Oxygen to keep SpO2 88 to 94% F/u CXR as needed Mucinex  A:  Hx of HTN, HLD. Cardiomegaly with hx of pericardial effusion s/p pericardial window 11/25 [name]). Hypotension after intubation >> likely from PEEPi and sedation, resolved. Lt leg swelling -Doppler left leg neg 11/17 & 12/5 P:  Hydralazine, pravastatin  A:   CKD, stable Hypernatremia >> resolved. P:  Monitor renal fx, urine outpt  A:   Anemia of chronic disease and critical illness VTE prophylaxis P:  Heparin F/u CBC intermittently  A:   DM II with renal complications. Renal function trending better P:  SSI  A:   Acute metabolic encephalopathy in setting respiratory failure >> resolved. Hx depression, anxiety, panic attacks. Deconditioning. P:   Continue zoloft, fentanyl outpatient xanax PT >> will need SNF placement prior to transitioning home  Goals of care >> DNR/DNI.  SUMMARY: 12/11-had further discussion with her about goals of care.  She is clear that she would not want to be re-intubated, and is okay with passing if "it is  her time".  She has decided for full DNR status.  She would also be interested in learning more about option for home hospice.  Will consult palliative care team to discuss this in more detail. 12/12- She met Palliative Care Consult, and anticipates more education about options.  CD Annamaria Boots, MD [facility] Pulmonary/Critical Care 05/29/2014, 9:37 AM Pager:  [phone]  m [phone] After 3pm call: [phone]

## 2014-05-30 ENCOUNTER — Other Ambulatory Visit: Payer: Medicare Other

## 2014-05-30 LAB — GLUCOSE, CAPILLARY
Glucose-Capillary: 99 mg/dL (ref 70–99)
Glucose-Capillary: 197 mg/dL — ABNORMAL HIGH (ref 70–99)

## 2014-05-30 MED ORDER — GLUCERNA SHAKE PO LIQD: 237.0000 mL | Freq: Three times a day (TID) | ORAL | Status: AC

## 2014-05-30 MED ORDER — INSULIN ASPART 100 UNIT/ML ~~LOC~~ SOLN: SUBCUTANEOUS | Status: DC

## 2014-05-30 MED ORDER — PREDNISONE 10 MG PO TABS: ORAL_TABLET | ORAL | Status: DC

## 2014-05-30 MED ORDER — DM-GUAIFENESIN ER 30-600 MG PO TB12: 1.0000 | ORAL_TABLET | Freq: Two times a day (BID) | ORAL | Status: DC

## 2014-05-30 MED ORDER — ALPRAZOLAM 0.25 MG PO TABS
0.2500 mg | ORAL_TABLET | Freq: Two times a day (BID) | ORAL | Status: DC | PRN
Start: 2014-05-30 — End: 2014-06-24

## 2014-05-30 MED ORDER — PROMETHAZINE-CODEINE 6.25-10 MG/5ML PO SYRP: 5.0000 mL | ORAL_SOLUTION | Freq: Four times a day (QID) | ORAL | Status: DC | PRN

## 2014-05-30 MED ORDER — ALBUTEROL SULFATE (2.5 MG/3ML) 0.083% IN NEBU: 2.5000 mg | INHALATION_SOLUTION | RESPIRATORY_TRACT | Status: DC | PRN

## 2014-05-30 MED ORDER — IPRATROPIUM-ALBUTEROL 0.5-2.5 (3) MG/3ML IN SOLN: 3.0000 mL | Freq: Four times a day (QID) | RESPIRATORY_TRACT | Status: DC

## 2014-05-30 MED ORDER — ARFORMOTEROL TARTRATE 15 MCG/2ML IN NEBU: 15.0000 ug | INHALATION_SOLUTION | Freq: Two times a day (BID) | RESPIRATORY_TRACT | Status: DC

## 2014-05-30 NOTE — Progress Notes (Signed)
Physical Therapy Treatment Patient Details Name: [name] MRN: [id number] DOB: 04/19/1946 Today's Date: 05/30/2014    History of Present Illness 68 yo female from [place] living with altered mental status from AECOPD with acute on chronic hypoxic/hypercapnic respiratory failure.  She had recent admit for cellulitis, respiratory failure, and pericardial effusion s/p window 05/11/14.  She is followed by Dr. 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    PT Comments    Pt progressing slowly with mobility but limited by O2 desaturation, encouraged pt in making small improvements each day. Pt ambulated 30' with 2 rest breaks with RW and min-guard A. On 3L, O2 desat to 82%, on 4L, O2 sats 90%. PT will continue to follow.   Follow Up Recommendations  SNF;Supervision/Assistance - 24 hour     Equipment Recommendations  None recommended by PT    Recommendations for Other Services Rehab consult;OT consult     Precautions / Restrictions Precautions Precautions: Fall Restrictions Weight Bearing Restrictions: No    Mobility  Bed Mobility Overal bed mobility: Modified Independent Bed Mobility: Supine to Sit     Supine to sit: Modified independent (Device/Increase time)     General bed mobility comments: pt able to get loegs off bed and elevate trunk without physical assist  Transfers Overall transfer level: Needs assistance Equipment used: Rolling walker (2 wheeled) Transfers: Sit to/from Stand Sit to Stand: Supervision         General transfer comment: practiced transfers from recliner, bed, and toilet. Pt able to stand safely without physical assist. Became anxious before sitting due to feeling of weakness of knees, vc's for staying calm and sitting safely in chair  Ambulation/Gait Ambulation/Gait assistance: Min guard Ambulation Distance (Feet): 47 Feet (10', 12', 25') Assistive device: Rolling walker (2 wheeled) Gait Pattern/deviations: Step-through pattern;Trunk  flexed;Decreased stride length Gait velocity: decreased   General Gait Details: first walk, kept pt at 3L O2 and did some conversing. She became very fatigued after 12' and needed seated rest, O2 sats 82%. Sat for 4 mins with pursed lip breathing and discussed energy conservation and various strategies. Second time, O2 increased to 4L, instructed in no talking, tolerated 25' with no knee weakness and O2 sats 90% afterwards. Returned to 3L at wall after session.   Stairs            Wheelchair Mobility    Modified Rankin (Stroke Patients Only)       Balance Overall balance assessment: Needs assistance Sitting-balance support: No upper extremity supported Sitting balance-Leahy Scale: Normal     Standing balance support: Single extremity supported Standing balance-Leahy Scale: Fair Standing balance comment: able to stand without UE support, statically but unable to perform dynamic mvmt without UE support due to fatigue                    Cognition Arousal/Alertness: Awake/alert Behavior During Therapy: WFL for tasks assessed/performed Overall Cognitive Status: Within Functional Limits for tasks assessed                      Exercises      General Comments        Pertinent Vitals/Pain Pain Assessment: No/denies pain  See gait assessment    Home Living                      Prior Function            PT Goals (current goals can  now be found in the care plan section) Acute Rehab PT Goals Patient Stated Goal: Return to [facility] PT Goal Formulation: With patient Time For Goal Achievement: 05/31/14 Potential to Achieve Goals: Good Progress towards PT goals: Progressing toward goals    Frequency  Min 2X/week    PT Plan Current plan remains appropriate    Co-evaluation             End of Session Equipment Utilized During Treatment: Gait belt;Oxygen Activity Tolerance: Patient limited by fatigue Patient left: in chair;with  call bell/phone within reach     Time: 1159-1224 PT Time Calculation (min) (ACUTE ONLY): 25 min  Charges:  $Gait Training: 8-22 mins $Therapeutic Activity: 8-22 mins                    G Codes:     Leighton Roach, PT  Acute Rehab Services  [phone]  Leighton Roach 05/30/2014, 12:47 PM

## 2014-05-30 NOTE — Progress Notes (Signed)
PULMONARY / CRITICAL CARE MEDICINE   Name: [name] MRN: [id number] DOB: October 11, 1945    ADMISSION DATE:  05/20/2014  REFERRING MD :  EDP  CHIEF COMPLAINT:  Respiratory Failure  INITIAL PRESENTATION:   68 yo female from [place] living with altered mental status from AECOPD with acute on chronic hypoxic/hypercapnic respiratory failure.  She had recent admit for cellulitis, respiratory failure, and pericardial effusion s/p window 05/11/14.  She is followed by Dr. 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  PFT's 2012: FEV1 .85, ratio 47%.  STUDIES:   SIGNIFICANT EVENTS: 12/01 - discharged from [facility] to [facility] 12/04 - readmitted for hypoxic respiratory failure 12/08 - extubated, DNI established 12/10 - transfer to floor 12/11 - consult palliative care to assess for outpt hospice  SUBJECTIVE:  oob to chair breathing at baseline "im very weak'  VITAL SIGNS: Temp:  [98.4 F (36.9 C)-98.6 F (37 C)] 98.4 F (36.9 C) (12/14 0623) Pulse Rate:  [81-102] 81 (12/14 0623) Resp:  [18-20] 20 (12/14 0623) BP: (141-164)/(81-88) 141/87 mmHg (12/14 0623) SpO2:  [93 %-99 %] 99 % (12/14 0623) Weight:  [154 lb (69.854 kg)] 154 lb (69.854 kg) (12/14 0623) INTAKE / OUTPUT: Intake/Output      12/13 0701 - 12/14 0700 12/14 0701 - 12/15 0700   P.O. 1060    Total Intake(mL/kg) 1060 (15.2)    Urine (mL/kg/hr) 1575 (0.9)    Total Output 1575     Net -515          Urine Occurrence 4 x    Stool Occurrence 1 x      PHYSICAL EXAMINATION: General: pleasant, sitting in bed Neuro: normal strength HEENT: raspy voice, no stridor Cardiovascular: regular Lungs: decreased breath sounds, no wheeze. Unlabored Abdomen: soft, non tender Musculoskeletal: no edema Skin: no rashes  LABS:  CBC  Recent Labs Lab 05/24/14 0300  WBC 7.7  HGB 7.7*  HCT 24.8*  PLT 298   BMET  Recent Labs Lab 05/24/14 0300 05/25/14 0237  NA 149* 146  K 4.4 4.3  CL 107 103  CO2 31 30  BUN 48*  43*  CREATININE 1.28* 1.14*  GLUCOSE 172* 116*   Electrolytes  Recent Labs Lab 05/24/14 0300 05/25/14 0237  CALCIUM 9.4 9.5  MG 2.4  --   PHOS 2.7  --    ABG No results for input(s): PHART, PCO2ART, PO2ART in the last 168 hours. Glucose  Recent Labs Lab 05/28/14 2135 05/29/14 0736 05/29/14 1124 05/29/14 1731 05/29/14 2212 05/30/14 0734  GLUCAP 82 96 233* 147* 131* 99    Imaging No results found.  ASSESSMENT / PLAN:  ETT 12/4  >>> 12/8 A: Acute on chronic hypercarbic and hypoxic respiratory failure 2nd to AECOPD >> completed Abx 12/08.  She has multiple intubations in last 1 month.  DNI established. P:   Change to prednisone 12/11, tapered to 20 mg daily -slow taper over 2 wks Continue brovana/pulmicort and scheduled duonebs Oxygen to keep SpO2 88 to 94% Mucinex  A:  Hx of HTN, HLD. Cardiomegaly with hx of pericardial effusion s/p pericardial window 11/25 [name]). Hypotension after intubation >> likely from PEEPi and sedation, resolved. Lt leg swelling -Doppler left leg neg 11/17 & 12/5 P:  Hydralazine, pravastatin  A:   CKD, stable Hypernatremia >> resolved. P:   Monitor renal fx, urine outpt  A:   Anemia of chronic disease and critical illness VTE prophylaxis P:  Heparin F/u CBC intermittently  A:  DM II with renal complications. Renal function trending better P:  SSI  A:   Acute metabolic encephalopathy in setting respiratory failure >> resolved. Hx depression, anxiety, panic attacks. Deconditioning. P:   Continue zoloft, fentanyl outpatient xanax PT >> will need SNF placement prior to transitioning home  Goals of care >> DNR/DNI.  SUMMARY: 12/11-had further discussion with her about goals of care.  She is clear that she would not want to be re-intubated, and is okay with passing if "it is her time".  She has decided for full DNR status. Will plan back to [facility] for rehab with home hospice eventually    [facility]  V. MD   05/30/2014, 9:36 AM

## 2014-05-30 NOTE — Discharge Summary (Signed)
Physician Discharge Summary  Patient ID: [name] MRN: [id number] DOB/AGE: 1945/12/18 68 y.o.  Admit date: 05/20/2014 Discharge date: 05/30/2014    Discharge Diagnoses:  Active Problems:   COPD, severe   Respiratory failure   Respiratory failure, acute and chronic           D/c Plan by Discharge Diagnosis  Acute on chronic hypercarbic respiratory failure - r/t AECOPD, severe COPD at baseline.  Multiple intubations in last 1 month.   D/c plan --  DNR/DNI established  Cont prednisone with slow taper over 2 weeks  abx complete  Continue brovana/pulmicort and scheduled duonebs  mucinex  Pulmonary hygiene  Pt/ot  Continuous O2 - 3-4L  Hx HTN, HLD  Cardiomegaly with hx pericardial effusion s/p pericardial window 11/25  D/c plan --  Cont hydralazine, pravastatin  PCP f/u   CKD - stable  D/c plan --  PCP f/u   DM  D/c plan --  SSI  outpt f/u   Acute metabolic encephalopathy - r/t hypercarbia in setting AECOPD  Hx depression, anxiety  Deconditioning  D/c plan --  D/c to SNF for further rehab efforts  Continue home zoloft, PRN xanax     Brief Summary: [name]  is a 68 yo female from [place] living with hx end stage COPD on home O2, HTN, CKD.  She presented 12/4 with altered mental status from AECOPD with acute on chronic hypoxic/hypercapnic respiratory failure. She had recent admit for cellulitis, respiratory failure, and pericardial effusion s/p window 05/11/14.She required intubation on arrival 12/4, extubated 12/8. She was treated with IV abx, nebulized BD's, IV steroids.  She improved and was extubated successfully 12/8.  Ongoing discussions regarding goals of care this admission given end-stage COPD and frequent admissions requiring intubation.  She has been clear that she is "tired" of all this and although she "wants to live", she wishes to be DNR with no further intubations and says that she is ok with passing "when it is her time".  She  is interested in more options for home hospice after some rehab at SNF to get "as strong as she can".  She is much improved, near baseline resp status on baseline O2 needs.  She is medically cleared for d/c back to [facility] with outpt f/u and plans for future transition to home with Hospice.   Consults:  Lines/tubes: ETT 12/4>>>12/8  Microbiology/Sepsis markers: BCx2 12/4>>> 1/2 coag neg staph  CDiff 12/5>>>neg  Sputum 12/5>>> normal flora    SIGNIFICANT EVENTS: 12/01 - discharged from [facility] to [facility] 12/04 - readmitted for hypoxic respiratory failure 12/08 - extubated, DNI established 12/10 - transfer to floor 12/11 - consult palliative care to assess for outpt hospice                                                                     Filed Vitals:   05/29/14 1722 05/29/14 2028 05/29/14 2216 05/30/14 0623  BP:   164/81 141/87  Pulse:  102 91 81  Temp:   98.5 F (36.9 C) 98.4 F (36.9 C)  TempSrc:   Oral Oral  Resp:  _0 Height:      Weight:    154 lb (69.854 kg)  SpO2: 96% 94% 93% 99%     Discharge Labs  BMET  Recent Labs Lab 05/24/14 0300 05/25/14 0237  NA 149* 146  K 4.4 4.3  CL 107 103  CO2 31 30  GLUCOSE 172* 116*  BUN 48* 43*  CREATININE 1.28* 1.14*  CALCIUM 9.4 9.5  MG 2.4  --   PHOS 2.7  --      CBC   Recent Labs Lab 05/24/14 0300  HGB 7.7*  HCT 24.8*  WBC 7.7  PLT 298   Anti-Coagulation No results for input(s): INR in the last 168 hours.         Follow-up Information    Follow up with REED, [name], DO. Schedule an appointment as soon as possible for a visit in 2 weeks.   Specialty:  Geriatric Medicine   Why:  after discharge from [facility]    Contact information:   [address] [place] [address] [phone]       Follow up with PARRETT,TAMMY, NP On 06/06/2014.   Specialty:  Nurse Practitioner   Why:  2:30pm    Contact information:   [address] [address] [phone]           Medication List    STOP taking these medications        levalbuterol 0.63 MG/3ML nebulizer solution  Commonly known as:  XOPENEX     tiotropium 18 MCG inhalation capsule  Commonly known as:  SPIRIVA      TAKE these medications        albuterol (2.5 MG/3ML) 0.083% nebulizer solution  Commonly known as:  PROVENTIL  Take 3 mLs (2.5 mg total) by nebulization every 3 (three) hours as needed for wheezing or shortness of breath.     ALPRAZolam 0.25 MG tablet  Commonly known as:  XANAX  Take 1 tablet (0.25 mg total) by mouth 2 (two) times daily as needed for anxiety.     arformoterol 15 MCG/2ML Nebu  Commonly known as:  BROVANA  Take 2 mLs (15 mcg total) by nebulization 2 (two) times daily.     budesonide-formoterol 80-4.5 MCG/ACT inhaler  Commonly known as:  SYMBICORT  Inhale 2 puffs into the lungs 2 (two) times daily.     dextromethorphan-guaiFENesin 30-600 MG per 12 hr tablet  Commonly known as:  MUCINEX DM  Take 1 tablet by mouth 2 (two) times daily.     feeding supplement (GLUCERNA SHAKE) Liqd  Take 237 mLs by mouth 3 (three) times daily between meals.     hydrALAZINE 25 MG tablet  Commonly known as:  APRESOLINE  Take 1 tablet (25 mg total) by mouth every 8 (eight) hours.     insulin aspart 100 UNIT/ML injection  Commonly known as:  novoLOG  - SQ qhs   -   - CBG 70 - 120: 0 units  - CBG 121 - 150: 0 units  - CBG 151 - 200: 0 units  - CBG 201 - 250: 2 units  - CBG 251 - 300: 3 units  - CBG 301 - 350: 4 units  - CBG 351 - 400: 5 units  - CBG > 400: call MD     insulin aspart 100 UNIT/ML injection  Commonly known as:  novoLOG  - SQ TID with meals   -   - CBG 70 - 120: 0 units  - CBG 121 - 150: 2 units  - CBG 151 - 200: 3 units  - CBG  201 - 250: 5 units  - CBG 251 - 300: 8 units  - CBG 301 - 350: 11 units  - CBG 351 - 400: 15 units  - CBG > 400: call MD     ipratropium-albuterol 0.5-2.5 (3) MG/3ML Soln  Commonly known as:   DUONEB  Take 3 mLs by nebulization 4 (four) times daily.     lovastatin 40 MG tablet  Commonly known as:  MEVACOR  Take 1 tablet (40 mg total) by mouth at bedtime. For hyperlipedemia     MULTIVITAMIN ADULTS 50+ Tabs  Take 1 tablet by mouth daily.     pantoprazole 40 MG tablet  Commonly known as:  PROTONIX  Take 1 tablet (40 mg total) by mouth daily.     predniSONE 10 MG tablet  Commonly known as:  DELTASONE  2 tabs po daily x 5 days then 1 tab po daily x 5 days then stop     promethazine-codeine 6.25-10 MG/5ML syrup  Commonly known as:  PHENERGAN with CODEINE  Take 5 mLs by mouth every 6 (six) hours as needed for cough.     sertraline 50 MG tablet  Commonly known as:  ZOLOFT  Take 1 tablet (50 mg total) by mouth at bedtime.          Disposition: 03-Skilled Nursing Facility  Discharged Condition: [name] has met maximum benefit of inpatient care and is medically stable and cleared for discharge.  Patient is pending follow up as above.      Time spent on disposition:  Greater than 35 minutes.   SignedDarlina Sicilian, NP 05/30/2014  11:30 AM Pager: [phone] or [phone]   Physician Statement:   The Patient was personally examined, the discharge assessment and plan has been personally reviewed and I agree with ACNP whiteheart's assessment and plan. > 30 minutes of time have been dedicated to discharge assessment, planning and discharge instructions.  She will be set up with home hospice- Dr Chase Caller has agreed to be the physician of record. Would also need out of facility DNR form completed.  Rigoberto Noel MD

## 2014-05-30 NOTE — Clinical Social Work Note (Signed)
Pt to be discharged to [facility]. Pt updated at bedside regarding discharge.  Facility: [facility] Report number: [phone] Transportation: EMS ([facility], scheduled for 2:30 per RN)  *Pt requesting pt's jewelry be collected from locker at [facility] prior to discharge*  Lubertha Sayres, Latanya Presser [phone]) Licensed Clinical Social Worker Orthopedics [phone]) and Surgical [phone])

## 2014-05-31 ENCOUNTER — Inpatient Hospital Stay: Payer: PRIVATE HEALTH INSURANCE | Admitting: Adult Health

## 2014-05-31 ENCOUNTER — Ambulatory Visit: Payer: Self-pay | Admitting: Thoracic Surgery (Cardiothoracic Vascular Surgery)

## 2014-05-31 ENCOUNTER — Encounter: Payer: Self-pay | Admitting: Internal Medicine

## 2014-05-31 ENCOUNTER — Other Ambulatory Visit: Payer: Self-pay | Admitting: *Deleted

## 2014-05-31 ENCOUNTER — Non-Acute Institutional Stay (SKILLED_NURSING_FACILITY): Payer: Medicare Other | Admitting: Internal Medicine

## 2014-05-31 DIAGNOSIS — G4700 Insomnia, unspecified: Secondary | ICD-10-CM

## 2014-05-31 DIAGNOSIS — D500 Iron deficiency anemia secondary to blood loss (chronic): Secondary | ICD-10-CM

## 2014-05-31 DIAGNOSIS — I10 Essential (primary) hypertension: Secondary | ICD-10-CM

## 2014-05-31 DIAGNOSIS — F411 Generalized anxiety disorder: Secondary | ICD-10-CM

## 2014-05-31 DIAGNOSIS — J441 Chronic obstructive pulmonary disease with (acute) exacerbation: Secondary | ICD-10-CM

## 2014-05-31 DIAGNOSIS — J449 Chronic obstructive pulmonary disease, unspecified: Secondary | ICD-10-CM

## 2014-05-31 DIAGNOSIS — J9611 Chronic respiratory failure with hypoxia: Secondary | ICD-10-CM

## 2014-05-31 MED ORDER — PROMETHAZINE-CODEINE 6.25-10 MG/5ML PO SYRP: 5.0000 mL | ORAL_SOLUTION | Freq: Four times a day (QID) | ORAL | Status: DC | PRN

## 2014-05-31 NOTE — Progress Notes (Signed)
[place]  PCP: Hollace Kinnier, DO  Code Status: DNR/DNI  Allergies  Allergen Reactions  . Lisinopril Swelling    Angioedema   . Advair Diskus [Fluticasone-Salmeterol] Other (See Comments)    Nose bleeds.   . Penicillins     hives  . Levaquin [Levofloxacin] Other (See Comments)    Chief Complaint  Patient presents with  . New Admit To SNF    s/p hospitalization for AECOPD on O2, acute respiratory failure s/p intubation     HPI:  68 yo [name] seen today for new admission. She was admitted 05/20/14 - 05/30/14 for acute on chronic respiratory failure and severe COPD. She is O2 dependent. Currently on tapering dose of prednisone and reports breathing is much improved. She is receiving PT and OT. Appetite is excellent and no issues with bowel/bladder regimen. She does have increased anxiety at night and feels that she cannot breathe.  (+) insomnia last night. Legs feel weak. No pain.   She would like to resume oral hypoglycemics in lieu of insulin tx. CBGs range 100-130s, occasionally >150. No low BS reactions  Review of Systems:  Constitutional: Negative for fever, chills, weight loss, malaise/fatigue and diaphoresis.  HENT: Negative for congestion, hearing loss, earache and sore throat.   Eyes: Negative for eye pain, blurred vision, double vision and discharge.  Respiratory: Negative for cough, sputum production (+) shortness of breath and wheezing.   Cardiovascular: Negative for chest pain, palpitations, orthopnea. (+) R>L leg swelling.  Gastrointestinal: Negative for heartburn, nausea, vomiting, abdominal pain, melena, rectal bleed, diarrhea and constipation.  Genitourinary: Negative for dysuria, urgency, frequency, hematuria, nocturia and flank pain.  Musculoskeletal: Negative for back pain, falls, joint pain and myalgias. assistive device used- wheelchair Skin: Negative for itching, sores and rash.  Neurological: Negative for dizziness, tingling, focal  weakness and headaches.  Psychiatric/Behavioral: No depression and memory loss. The patient is nervous/anxious.     Past Medical History  Diagnosis Date  . Asthma   . COPD (chronic obstructive pulmonary disease)   . Hypertension   . Diabetes mellitus 09/13/2010    glucophage  . Gout   . Oxygen dependent     pt uses O2   . Pericardial effusion 08/2011    small residual on echo  . Hyperlipidemia   . Type II or unspecified type diabetes mellitus with neurological manifestations, uncontrolled   . Personal history of noncompliance with medical treatment, presenting hazards to health   . Unspecified disease of pericardium   . Hemorrhage of rectum and anus   . Panic disorder without agoraphobia   . Depressive disorder, not elsewhere classified   . Edema   . Anxiety state, unspecified   . Unspecified hereditary and idiopathic peripheral neuropathy   . Type II or unspecified type diabetes mellitus without mention of complication, not stated as uncontrolled   . Other and unspecified hyperlipidemia   . Obesity, unspecified   . Unspecified tinnitus   . Unspecified essential hypertension   . Pneumonia, organism unspecified   . Chronic obstructive asthma, unspecified   . Acute and chronic respiratory failure   . Urinary frequency   . Urgency of urination   . Anemia due to chronic blood loss 01/04/2013   Past Surgical History  Procedure Laterality Date  . Breast biopsy      both breasts  . Total abdominal hysterectomy    . Esophagogastroduodenoscopy N/A 01/03/2013    Procedure: ESOPHAGOGASTRODUODENOSCOPY (EGD);  Surgeon: Inda Castle, MD;  Location:  [facility];  Service: Endoscopy;  Laterality: N/A;  . Colonoscopy N/A 01/05/2013    Procedure: COLONOSCOPY;  Surgeon: Gatha Mayer, MD;  Location: [facility];  Service: Endoscopy;  Laterality: N/A;  . Subxyphoid pericardial window N/A 05/11/2014    Procedure: SUBXYPHOID PERICARDIAL WINDOW;  Surgeon: Melrose Nakayama, MD;   Location: [facility];  Service: Thoracic;  Laterality: N/A;  . Tee without cardioversion N/A 05/11/2014    Procedure: TRANSESOPHAGEAL ECHOCARDIOGRAM (TEE);  Surgeon: Melrose Nakayama, MD;  Location: [facility];  Service: Thoracic;  Laterality: N/A;   Social History:   reports that she quit smoking about 9 years ago. Her smoking use included Cigarettes. She has a 30 pack-year smoking history. She has never used smokeless tobacco. She reports that she drinks about Courtney.0 oz of alcohol per week. She reports that she does not use illicit drugs.  Family History  Problem Relation Age of Onset  . COPD Mother   . COPD Sister   . Hypertension Sister   . Cancer Father   . Asthma Father   . Diabetes Other     3 maternal aunts  . Prostate cancer Paternal Uncle     Medications: Patient's Medications  New Prescriptions   No medications on file  Previous Medications   ALBUTEROL (PROVENTIL) (2.5 MG/3ML) 0.083% NEBULIZER SOLUTION    Take 3 mLs (2.5 mg total) by nebulization every 3 (three) hours as needed for wheezing or shortness of breath.   ALPRAZOLAM (XANAX) 0.25 MG TABLET    Take Courtney tablet (0.25 mg total) by mouth 2 (two) times daily as needed for anxiety.   ARFORMOTEROL (BROVANA) 15 MCG/2ML NEBU    Take 2 mLs (15 mcg total) by nebulization 2 (two) times daily.   BUDESONIDE-FORMOTEROL (SYMBICORT) 80-4.5 MCG/ACT INHALER    Inhale 2 puffs into the lungs 2 (two) times daily.   DEXTROMETHORPHAN-GUAIFENESIN (MUCINEX DM) 30-600 MG PER 12 HR TABLET    Take Courtney tablet by mouth 2 (two) times daily.   FEEDING SUPPLEMENT, GLUCERNA SHAKE, (GLUCERNA SHAKE) LIQD    Take 237 mLs by mouth 3 (three) times daily between meals.   HYDRALAZINE (APRESOLINE) 25 MG TABLET    Take Courtney tablet (25 mg total) by mouth every 8 (eight) hours.   INSULIN ASPART (NOVOLOG) 100 UNIT/ML INJECTION    SQ qhs   CBG 70 - 120: 0 units CBG 121 - 150: 0 units CBG 151 - 200: 0 units CBG 201 - 250: 2 units CBG 251 - 300: 3 units CBG 301 - 350: 4  units CBG 351 - 400: 5 units CBG > 400: call MD   INSULIN ASPART (NOVOLOG) 100 UNIT/ML INJECTION    SQ TID with meals   CBG 70 - 120: 0 units CBG 121 - 150: 2 units CBG 151 - 200: 3 units CBG 201 - 250: 5 units CBG 251 - 300: 8 units CBG 301 - 350: 11 units CBG 351 - 400: 15 units CBG > 400: call MD   IPRATROPIUM-ALBUTEROL (DUONEB) 0.5-2.5 (3) MG/3ML SOLN    Take 3 mLs by nebulization 4 (four) times daily.   LOVASTATIN (MEVACOR) 40 MG TABLET    Take Courtney tablet (40 mg total) by mouth at bedtime. For hyperlipedemia   MULTIPLE VITAMINS-MINERALS (MULTIVITAMIN ADULTS 50+) TABS    Take Courtney tablet by mouth daily.   PANTOPRAZOLE (PROTONIX) 40 MG TABLET    Take Courtney tablet (40 mg total) by mouth daily.   PREDNISONE (DELTASONE) 10 MG TABLET  2 tabs po daily x 5 days then Courtney tab po daily x 5 days then stop   SERTRALINE (ZOLOFT) 50 MG TABLET    Take Courtney tablet (50 mg total) by mouth at bedtime.  Modified Medications   Modified Medication Previous Medication   PROMETHAZINE-CODEINE (PHENERGAN WITH CODEINE) 6.25-10 MG/5ML SYRUP promethazine-codeine (PHENERGAN WITH CODEINE) 6.25-10 MG/5ML syrup      Take 5 mLs by mouth every 6 (six) hours as needed for cough.    Take 5 mLs by mouth every 6 (six) hours as needed for cough.  Discontinued Medications   No medications on file     Physical Exam: CONSTITUTIONAL: Looks well in NAD. Awake, alert and oriented x 3. No conversational dyspnea HEENT: PERRLA. Oropharynx clear and without exudate NECK: Supple. Nontender. No palpable cervical or supraclavicular lymph nodes. No carotid bruit b/l. CVS: Regular rate without murmur, gallop or rub. LUNGS: CTA b/l no wheezing, rales or rhonchi. Reduced BS at base ABDOMEN: Bowel sounds present x 4. Soft, nontender, nondistended. No palpable mass or bruit EXTREMITIES: R>L +Courtney pitting LE edema. Distal pulses palpable. No calf tenderness or palpable cords PSYCH: Affect, behavior and mood normal MUSC: FROM ankles b/l  Filed  Vitals:   05/31/14 1139  BP: 167/95  Pulse: 88  Temp: 97.6 F (36.4 C)  Resp: 18  Height: 5\' 2"  (Courtney.575 m)  Weight: 154 lb (69.854 kg)  SpO2: 96%     Labs reviewed: Basic Metabolic Panel:  Recent Labs  05/22/14 0315 05/22/14 1144 05/22/14 2222 05/24/14 0300 05/25/14 0237  NA 143  --   --  149* 146  K 4.4  --   --  4.4 4.3  CL 102  --   --  107 103  CO2 31  --   --  31 30  GLUCOSE 199*  --   --  172* 116*  BUN 36*  --   --  48* 43*  CREATININE Courtney.44*  --   --  Courtney.28* Courtney.14*  CALCIUM 9.2  --   --  9.4 9.5  MG  --  2.5 2.5 2.4  --   PHOS  --  3.3 2.8 2.7  --    Liver Function Tests:  Recent Labs  05/12/14 0535 05/13/14 0500 05/20/14 0227  AST 15 12 19   ALT 18 18 24   ALKPHOS 52 56 94  BILITOT 0.4 0.4 0.2*  PROT 5.2* 5.8* 7.2  ALBUMIN 2.5* 2.5* 3.0*   No results for input(s): LIPASE, AMYLASE in the last 8760 hours. No results for input(s): AMMONIA in the last 8760 hours. CBC:  Recent Labs  05/01/14 2051  05/10/14 0200  05/20/14 0227  05/21/14 0242 05/22/14 0315 05/24/14 0300  WBC 11.3*  < > 6.5  < > 10.3  < > 9.4 9.3 7.7  NEUTROABS 8.9*  --  5.8  --  8.6*  --   --   --   --   HGB 10.4*  < > 9.2*  < > 9.5*  < > 8.6* 8.2* 7.7*  HCT 34.Courtney*  < > 30.9*  < > 32.9*  < > 27.9* 26.0* 24.8*  MCV 92.9  < > 93.9  < > 94.0  < > 90.9 88.7 87.0  PLT 250  < > 244  < > 336  < > 289 290 298  < > = values in this interval not displayed. Cardiac Enzymes:  Recent Labs  05/04/14 0459 05/10/14 0200 05/23/14 0211  TROPONINI <0.30 <0.30 <0.30  BNP: Invalid input(s): POCBNP CBG:  Recent Labs  05/29/14 2212 05/30/14 0734 05/30/14 1159  GLUCAP 131* 99 197*   HgA1c in August 2015 6.4%  Radiological Exams:   CHEST XRAY (05/24/14) - Bilateral basilar infiltrates and effusions.Tubes and lines intact   Assessment/Plan   ICD-9-CM ICD-10-CM   Courtney. COPD, frequent exacerbations 496 J44.9   2. Chronic respiratory failure with hypoxia 518.83 J96.11    799.02    3.  Essential hypertension, benign 401.Courtney I10   4. Insomnia 780.52 G47.00   5. Anxiety state 300.00 F41.Courtney   6. Anemia due to chronic blood loss 280.0 D50.0    - Complete prednisone taper - Palliative care consult pending - Continue [place] O2 at 4L/min - Continue CBG qAC and qHS. Sliding scale as written - Alprazolam prn anxiety and difficulty sleeping - Check CBC to monitor Hgb. May need transfusion - Continue PT/OT - Continue all other medications as Rx    Goals of care: short term rehab to discharge home. Palliative care consulted.   Labs/tests ordered: CBC     Izza Bickle S. Perlie Gold  [facility] [address] [place], [place] [address] [phone] Office (Wednesdays and Fridays 8 AM - 5 PM) [phone] Cell (Monday-Friday 8 AM - 5 PM)

## 2014-05-31 NOTE — Telephone Encounter (Signed)
[facility] 

## 2014-06-01 ENCOUNTER — Other Ambulatory Visit: Payer: Self-pay | Admitting: *Deleted

## 2014-06-01 MED ORDER — PROMETHAZINE-CODEINE 6.25-10 MG/5ML PO SYRP: 5.0000 mL | ORAL_SOLUTION | Freq: Four times a day (QID) | ORAL | Status: DC | PRN

## 2014-06-01 NOTE — Telephone Encounter (Signed)
[facility] 

## 2014-06-02 ENCOUNTER — Ambulatory Visit: Payer: Medicare Other

## 2014-06-02 ENCOUNTER — Other Ambulatory Visit: Payer: Self-pay | Admitting: *Deleted

## 2014-06-02 ENCOUNTER — Ambulatory Visit: Payer: Medicare Other | Admitting: Internal Medicine

## 2014-06-02 ENCOUNTER — Telehealth: Payer: Self-pay | Admitting: *Deleted

## 2014-06-02 MED ORDER — PROMETHAZINE-CODEINE 6.25-10 MG/5ML PO SYRP: 5.0000 mL | ORAL_SOLUTION | Freq: Four times a day (QID) | ORAL | Status: DC | PRN

## 2014-06-02 NOTE — Telephone Encounter (Signed)
[facility]  

## 2014-06-06 ENCOUNTER — Encounter: Payer: Self-pay | Admitting: Adult Health

## 2014-06-06 ENCOUNTER — Ambulatory Visit (INDEPENDENT_AMBULATORY_CARE_PROVIDER_SITE_OTHER): Payer: Medicare Other | Admitting: Adult Health

## 2014-06-06 ENCOUNTER — Inpatient Hospital Stay: Payer: PRIVATE HEALTH INSURANCE | Admitting: Adult Health

## 2014-06-06 VITALS — BP 148/90 | HR 104 | Temp 98.1°F | Ht 62.5 in | Wt 182.0 lb

## 2014-06-06 DIAGNOSIS — J449 Chronic obstructive pulmonary disease, unspecified: Secondary | ICD-10-CM

## 2014-06-06 DIAGNOSIS — J441 Chronic obstructive pulmonary disease with (acute) exacerbation: Secondary | ICD-10-CM

## 2014-06-06 MED ORDER — BUDESONIDE 0.25 MG/2ML IN SUSP: 0.2500 mg | Freq: Two times a day (BID) | RESPIRATORY_TRACT | Status: DC

## 2014-06-06 NOTE — Progress Notes (Signed)
Subjective:    Patient ID: [name], female    DOB: 02-24-1946, 68 y.o.   MRN: [id number]  HPI Severe COPD -O2 depenedent (advair causes epistaxis)  chronic respiratory failure- MM phenotype Nov 2012  - Spirometry Nov 2012: Fev1 0.85L/45%, Ratio  =- COPD based on age, spiormetry and smoking hx and emphysema on CT 2014  04/27/14 [facility] follow up  Patient presents for a post hospital follow-up. Patient was admitted November 2 through November 5 for acute exacerbation and pulmonary edema with acute on chronic diastolic heart failure decompensation.she was treated with antibiotics, steroids, and nebulized bronchodilators. Discharged on steroid taper and Levaquin. Did not take the steroids.  Felt levaquin caused rash and throat tightness.  Patient says since discharge. She is feeling much improved with decreased cough, congestion and shortness of breath. Patient was recommended to hold her colchicine, Cozaar ,HCTZ and Glucophage due to renal insufficiency and discuss with her primary care physician. However, patient did not follow these instructions and is continued on these .  Remains on Symbcort and Spiriva .  Remains on 2lm /of O2  She denies chest pain, orthopnea , edema or fever.    06/06/2014 [facility] follow up  Patient returns for a post hospital follow-up She was recently readmitted on December 4 through December 14 for a COPD exacerbation compensated by acute on chronic respiratory failure. She was treated with mechanical ventilatory support along with aggressive IV antibiotics and steroids. Discharged on a slow prednisone taper. Patient was made a DO NOT RESUSCITATE during her hospitalization. Prior to this admission. She's had 2 admissions over the last 2 months. Most recently admitted for cellulitis and a pericardial effusion, status post window on November 25.  She was changed over to nebulizers to Brovana  And  Budesonide. However she is taking brovana and  symbicort at the rehab center.  She denies any chest pain, orthopnea, PND, worsening leg swelling, abdominal pain, nausea, vomiting or diarrhea.  Review of Systems .Constitutional:   No  weight loss, night sweats,  Fevers, chills, + fatigue, or  lassitude.  HEENT:   No headaches,  Difficulty swallowing,  Tooth/dental problems, or  Sore throat,                No sneezing, itching, ear ache, nasal congestion, post nasal drip,   CV:  No chest pain,  Orthopnea, PND,  +swelling in lower extremities,  NO anasarca, dizziness, palpitations, syncope.   GI  No heartburn, indigestion, abdominal pain, nausea, vomiting, diarrhea, change in bowel habits, loss of appetite, bloody stools.   Resp:  No chest wall deformity  Skin: no rash or lesions.  GU: no dysuria, change in color of urine, no urgency or frequency.  No flank pain, no hematuria   MS:  No joint pain or swelling.  No decreased range of motion.  No back pain.  Psych:  No change in mood or affect. No depression or anxiety.  No memory loss.         Objective:   Physical Exam GEN: A/Ox3; pleasant , alert , chronically ill appearing , in wheelchair   HEENT:  [place]/AT,  EACs-clear, TMs-wnl, NOSE-clear, THROAT-clear, no lesions, no postnasal drip or exudate noted.   NECK:  Supple w/ fair ROM; no JVD; normal carotid impulses w/o bruits; no thyromegaly or nodules palpated; no lymphadenopathy.  RESP  Decreased BS in bases .no accessory muscle use, no dullness to percussion  CARD: ST , no m/r/g  , 1+  peripheral edema, pulses intact, no cyanosis or clubbing.  GI:   Soft & nt; nml bowel sounds; no organomegaly or masses detected.  Musco: Warm bil, no deformities or joint swelling noted.   Neuro: alert, no focal deficits noted.    Skin: Warm, no lesions or rashes         Assessment & Plan:

## 2014-06-06 NOTE — Assessment & Plan Note (Addendum)
Frequent admissions now in rehab center /DNR status   Plan  Discontinue Symbicort  Begin Budesonide Neb Twice daily   Continue on [employer] Twice daily   Follow up Dr. Chase Caller as planned and As needed   Please contact office for sooner follow up if symptoms do not improve or worsen or seek emergency care

## 2014-06-06 NOTE — Patient Instructions (Addendum)
Discontinue Symbicort  Begin Budesonide Neb Twice daily   Continue on [employer] Twice daily   Follow up Dr. Chase Caller as planned and As needed   Please contact office for sooner follow up if symptoms do not improve or worsen or seek emergency care

## 2014-06-07 ENCOUNTER — Encounter: Payer: Self-pay | Admitting: Thoracic Surgery (Cardiothoracic Vascular Surgery)

## 2014-06-07 ENCOUNTER — Ambulatory Visit
Admission: RE | Admit: 2014-06-07 | Discharge: 2014-06-07 | Disposition: A | Discharge status: Home / Self Care | Payer: Medicare Other | Source: Ambulatory Visit | Attending: Thoracic Surgery (Cardiothoracic Vascular Surgery) | Admitting: Thoracic Surgery (Cardiothoracic Vascular Surgery)

## 2014-06-07 ENCOUNTER — Ambulatory Visit (INDEPENDENT_AMBULATORY_CARE_PROVIDER_SITE_OTHER): Payer: Self-pay | Admitting: Thoracic Surgery (Cardiothoracic Vascular Surgery)

## 2014-06-07 ENCOUNTER — Encounter (HOSPITAL_COMMUNITY): Payer: Self-pay | Admitting: Certified Registered"

## 2014-06-07 ENCOUNTER — Inpatient Hospital Stay (HOSPITAL_COMMUNITY)
Admission: AD | Admit: 2014-06-07 | Discharge: 2014-06-24 | DRG: 270 | Disposition: A | Discharge status: Skilled Nursing Facility | Payer: Medicare Other | Source: Ambulatory Visit | Attending: Pulmonary Disease | Admitting: Pulmonary Disease

## 2014-06-07 ENCOUNTER — Other Ambulatory Visit: Payer: Self-pay | Admitting: *Deleted

## 2014-06-07 ENCOUNTER — Other Ambulatory Visit (HOSPITAL_COMMUNITY): Payer: Self-pay | Admitting: Thoracic Surgery (Cardiothoracic Vascular Surgery)

## 2014-06-07 VITALS — BP 173/98 | HR 98 | Resp 16 | Ht 62.5 in | Wt 184.0 lb

## 2014-06-07 DIAGNOSIS — I313 Pericardial effusion (noninflammatory): Secondary | ICD-10-CM

## 2014-06-07 DIAGNOSIS — M109 Gout, unspecified: Secondary | ICD-10-CM | POA: Diagnosis present

## 2014-06-07 DIAGNOSIS — Z87891 Personal history of nicotine dependence: Secondary | ICD-10-CM | POA: Diagnosis not present

## 2014-06-07 DIAGNOSIS — G609 Hereditary and idiopathic neuropathy, unspecified: Secondary | ICD-10-CM | POA: Diagnosis present

## 2014-06-07 DIAGNOSIS — J9611 Chronic respiratory failure with hypoxia: Secondary | ICD-10-CM

## 2014-06-07 DIAGNOSIS — I314 Cardiac tamponade: Secondary | ICD-10-CM | POA: Diagnosis present

## 2014-06-07 DIAGNOSIS — I309 Acute pericarditis, unspecified: Secondary | ICD-10-CM | POA: Insufficient documentation

## 2014-06-07 DIAGNOSIS — J438 Other emphysema: Secondary | ICD-10-CM | POA: Insufficient documentation

## 2014-06-07 DIAGNOSIS — R34 Anuria and oliguria: Secondary | ICD-10-CM | POA: Diagnosis present

## 2014-06-07 DIAGNOSIS — Z860100 Personal history of colon polyps, unspecified: Secondary | ICD-10-CM

## 2014-06-07 DIAGNOSIS — E1149 Type 2 diabetes mellitus with other diabetic neurological complication: Secondary | ICD-10-CM | POA: Diagnosis present

## 2014-06-07 DIAGNOSIS — N170 Acute kidney failure with tubular necrosis: Secondary | ICD-10-CM | POA: Diagnosis not present

## 2014-06-07 DIAGNOSIS — E669 Obesity, unspecified: Secondary | ICD-10-CM | POA: Diagnosis present

## 2014-06-07 DIAGNOSIS — R600 Localized edema: Secondary | ICD-10-CM | POA: Diagnosis not present

## 2014-06-07 DIAGNOSIS — E119 Type 2 diabetes mellitus without complications: Secondary | ICD-10-CM

## 2014-06-07 DIAGNOSIS — D509 Iron deficiency anemia, unspecified: Secondary | ICD-10-CM

## 2014-06-07 DIAGNOSIS — I319 Disease of pericardium, unspecified: Secondary | ICD-10-CM | POA: Diagnosis not present

## 2014-06-07 DIAGNOSIS — I272 Other secondary pulmonary hypertension: Secondary | ICD-10-CM | POA: Diagnosis present

## 2014-06-07 DIAGNOSIS — Z794 Long term (current) use of insulin: Secondary | ICD-10-CM

## 2014-06-07 DIAGNOSIS — K31819 Angiodysplasia of stomach and duodenum without bleeding: Secondary | ICD-10-CM

## 2014-06-07 DIAGNOSIS — E876 Hypokalemia: Secondary | ICD-10-CM | POA: Diagnosis not present

## 2014-06-07 DIAGNOSIS — J810 Acute pulmonary edema: Secondary | ICD-10-CM | POA: Insufficient documentation

## 2014-06-07 DIAGNOSIS — D62 Acute posthemorrhagic anemia: Secondary | ICD-10-CM

## 2014-06-07 DIAGNOSIS — R609 Edema, unspecified: Secondary | ICD-10-CM | POA: Diagnosis present

## 2014-06-07 DIAGNOSIS — J9622 Acute and chronic respiratory failure with hypercapnia: Secondary | ICD-10-CM

## 2014-06-07 DIAGNOSIS — J45909 Unspecified asthma, uncomplicated: Secondary | ICD-10-CM | POA: Diagnosis present

## 2014-06-07 DIAGNOSIS — R000 Tachycardia, unspecified: Secondary | ICD-10-CM

## 2014-06-07 DIAGNOSIS — J811 Chronic pulmonary edema: Secondary | ICD-10-CM | POA: Diagnosis present

## 2014-06-07 DIAGNOSIS — J9811 Atelectasis: Secondary | ICD-10-CM | POA: Diagnosis not present

## 2014-06-07 DIAGNOSIS — I480 Paroxysmal atrial fibrillation: Secondary | ICD-10-CM | POA: Diagnosis not present

## 2014-06-07 DIAGNOSIS — D649 Anemia, unspecified: Secondary | ICD-10-CM | POA: Diagnosis present

## 2014-06-07 DIAGNOSIS — J9621 Acute and chronic respiratory failure with hypoxia: Secondary | ICD-10-CM | POA: Diagnosis present

## 2014-06-07 DIAGNOSIS — Z6834 Body mass index (BMI) 34.0-34.9, adult: Secondary | ICD-10-CM

## 2014-06-07 DIAGNOSIS — J9601 Acute respiratory failure with hypoxia: Secondary | ICD-10-CM | POA: Insufficient documentation

## 2014-06-07 DIAGNOSIS — K219 Gastro-esophageal reflux disease without esophagitis: Secondary | ICD-10-CM | POA: Diagnosis present

## 2014-06-07 DIAGNOSIS — F411 Generalized anxiety disorder: Secondary | ICD-10-CM

## 2014-06-07 DIAGNOSIS — R0603 Acute respiratory distress: Secondary | ICD-10-CM | POA: Insufficient documentation

## 2014-06-07 DIAGNOSIS — Z9981 Dependence on supplemental oxygen: Secondary | ICD-10-CM

## 2014-06-07 DIAGNOSIS — G4700 Insomnia, unspecified: Secondary | ICD-10-CM

## 2014-06-07 DIAGNOSIS — Z4682 Encounter for fitting and adjustment of non-vascular catheter: Secondary | ICD-10-CM

## 2014-06-07 DIAGNOSIS — F329 Major depressive disorder, single episode, unspecified: Secondary | ICD-10-CM | POA: Diagnosis present

## 2014-06-07 DIAGNOSIS — E1165 Type 2 diabetes mellitus with hyperglycemia: Secondary | ICD-10-CM | POA: Diagnosis present

## 2014-06-07 DIAGNOSIS — Z66 Do not resuscitate: Secondary | ICD-10-CM | POA: Diagnosis present

## 2014-06-07 DIAGNOSIS — J449 Chronic obstructive pulmonary disease, unspecified: Secondary | ICD-10-CM | POA: Insufficient documentation

## 2014-06-07 DIAGNOSIS — I517 Cardiomegaly: Secondary | ICD-10-CM

## 2014-06-07 DIAGNOSIS — J90 Pleural effusion, not elsewhere classified: Secondary | ICD-10-CM

## 2014-06-07 DIAGNOSIS — E872 Acidosis: Secondary | ICD-10-CM | POA: Diagnosis not present

## 2014-06-07 DIAGNOSIS — M7989 Other specified soft tissue disorders: Secondary | ICD-10-CM

## 2014-06-07 DIAGNOSIS — E785 Hyperlipidemia, unspecified: Secondary | ICD-10-CM | POA: Diagnosis present

## 2014-06-07 DIAGNOSIS — I10 Essential (primary) hypertension: Secondary | ICD-10-CM | POA: Diagnosis present

## 2014-06-07 DIAGNOSIS — L03116 Cellulitis of left lower limb: Secondary | ICD-10-CM

## 2014-06-07 DIAGNOSIS — R0789 Other chest pain: Secondary | ICD-10-CM

## 2014-06-07 DIAGNOSIS — E782 Mixed hyperlipidemia: Secondary | ICD-10-CM

## 2014-06-07 DIAGNOSIS — I3139 Other pericardial effusion (noninflammatory): Secondary | ICD-10-CM

## 2014-06-07 DIAGNOSIS — D500 Iron deficiency anemia secondary to blood loss (chronic): Secondary | ICD-10-CM

## 2014-06-07 DIAGNOSIS — Q2730 Arteriovenous malformation, site unspecified: Secondary | ICD-10-CM

## 2014-06-07 DIAGNOSIS — J441 Chronic obstructive pulmonary disease with (acute) exacerbation: Secondary | ICD-10-CM

## 2014-06-07 DIAGNOSIS — R579 Shock, unspecified: Secondary | ICD-10-CM

## 2014-06-07 DIAGNOSIS — J9620 Acute and chronic respiratory failure, unspecified whether with hypoxia or hypercapnia: Secondary | ICD-10-CM

## 2014-06-07 DIAGNOSIS — J9600 Acute respiratory failure, unspecified whether with hypoxia or hypercapnia: Secondary | ICD-10-CM

## 2014-06-07 DIAGNOSIS — K5520 Angiodysplasia of colon without hemorrhage: Secondary | ICD-10-CM

## 2014-06-07 DIAGNOSIS — Z9689 Presence of other specified functional implants: Secondary | ICD-10-CM | POA: Insufficient documentation

## 2014-06-07 LAB — BASIC METABOLIC PANEL
Anion gap: 8 (ref 5–15)
BUN: 10 mg/dL (ref 6–23)
CALCIUM: 9.6 mg/dL (ref 8.4–10.5)
CO2: 40 mmol/L (ref 19–32)
Chloride: 94 mEq/L — ABNORMAL LOW (ref 96–112)
Creatinine, Ser: 0.97 mg/dL (ref 0.50–1.10)
GFR calc Af Amer: 68 mL/min — ABNORMAL LOW (ref >90)
GFR, EST NON AFRICAN AMERICAN: 59 mL/min — AB (ref >90)
Glucose, Bld: 114 mg/dL — ABNORMAL HIGH (ref 70–99)
Potassium: 4.6 mmol/L (ref 3.5–5.1)
Sodium: 142 mmol/L (ref 135–145)

## 2014-06-07 LAB — URINALYSIS, ROUTINE W REFLEX MICROSCOPIC
BILIRUBIN URINE: NEGATIVE
GLUCOSE, UA: NEGATIVE mg/dL
Hgb urine dipstick: NEGATIVE
Ketones, ur: 15 mg/dL — AB
Leukocytes, UA: NEGATIVE
Nitrite: NEGATIVE
Protein, ur: 30 mg/dL — AB
Specific Gravity, Urine: 1.007 (ref 1.005–1.030)
Urobilinogen, UA: 0.2 mg/dL (ref 0.0–1.0)
pH: 7 (ref 5.0–8.0)

## 2014-06-07 LAB — GLUCOSE, CAPILLARY: Glucose-Capillary: 168 mg/dL — ABNORMAL HIGH (ref 70–99)

## 2014-06-07 LAB — URINE MICROSCOPIC-ADD ON

## 2014-06-07 LAB — CBC
HCT: 30.8 % — ABNORMAL LOW (ref 36.0–46.0)
Hemoglobin: 9.2 g/dL — ABNORMAL LOW (ref 12.0–15.0)
MCH: 28 pg (ref 26.0–34.0)
MCHC: 29.9 g/dL — AB (ref 30.0–36.0)
MCV: 93.9 fL (ref 78.0–100.0)
PLATELETS: 164 10*3/uL (ref 150–400)
RBC: 3.28 MIL/uL — AB (ref 3.87–5.11)
RDW: 18.2 % — ABNORMAL HIGH (ref 11.5–15.5)
WBC: 5.1 10*3/uL (ref 4.0–10.5)

## 2014-06-07 LAB — MRSA PCR SCREENING: MRSA by PCR: NEGATIVE

## 2014-06-07 LAB — PROTIME-INR
INR: 0.93 (ref 0.00–1.49)
Prothrombin Time: 12.6 seconds (ref 11.6–15.2)

## 2014-06-07 LAB — BRAIN NATRIURETIC PEPTIDE: B NATRIURETIC PEPTIDE 5: 89.3 pg/mL (ref 0.0–100.0)

## 2014-06-07 MED ORDER — DM-GUAIFENESIN ER 30-600 MG PO TB12
1.0000 | ORAL_TABLET | Freq: Two times a day (BID) | ORAL | Status: DC | PRN
  Filled 2014-06-07: qty 1

## 2014-06-07 MED ORDER — SODIUM CHLORIDE 0.45 % IV SOLN
INTRAVENOUS | Status: DC
  Administered 2014-06-08: 01:00:00 via INTRAVENOUS

## 2014-06-07 MED ORDER — ALBUTEROL SULFATE (2.5 MG/3ML) 0.083% IN NEBU
2.5000 mg | INHALATION_SOLUTION | RESPIRATORY_TRACT | Status: DC | PRN
  Administered 2014-06-08 – 2014-06-13 (×3): 2.5 mg via RESPIRATORY_TRACT
  Filled 2014-06-07 (×3): qty 3

## 2014-06-07 MED ORDER — HYDRALAZINE HCL 25 MG PO TABS
25.0000 mg | ORAL_TABLET | Freq: Three times a day (TID) | ORAL | Status: DC
  Administered 2014-06-07 – 2014-06-10 (×4): 25 mg via ORAL
  Filled 2014-06-07 (×11): qty 1

## 2014-06-07 MED ORDER — SODIUM CHLORIDE 0.9 % IJ SOLN
3.0000 mL | Freq: Two times a day (BID) | INTRAMUSCULAR | Status: DC
  Administered 2014-06-07: 3 mL via INTRAVENOUS
  Administered 2014-06-08: 10 mL via INTRAVENOUS

## 2014-06-07 MED ORDER — ADULT MULTIVITAMIN W/MINERALS CH
1.0000 | ORAL_TABLET | Freq: Every day | ORAL | Status: DC
  Administered 2014-06-08 – 2014-06-24 (×15): 1 via ORAL
  Filled 2014-06-07 (×17): qty 1

## 2014-06-07 MED ORDER — DEXTROSE-NACL 5-0.9 % IV SOLN: INTRAVENOUS | Status: DC

## 2014-06-07 MED ORDER — ONDANSETRON HCL 4 MG/2ML IJ SOLN: 4.0000 mg | Freq: Four times a day (QID) | INTRAMUSCULAR | Status: DC | PRN

## 2014-06-07 MED ORDER — SERTRALINE HCL 50 MG PO TABS
50.0000 mg | ORAL_TABLET | Freq: Every day | ORAL | Status: DC
  Administered 2014-06-07 – 2014-06-23 (×15): 50 mg via ORAL
  Filled 2014-06-07 (×18): qty 1

## 2014-06-07 MED ORDER — ONDANSETRON HCL 4 MG PO TABS: 4.0000 mg | ORAL_TABLET | Freq: Four times a day (QID) | ORAL | Status: DC | PRN

## 2014-06-07 MED ORDER — INSULIN ASPART 100 UNIT/ML ~~LOC~~ SOLN: 0.0000 [IU] | Freq: Three times a day (TID) | SUBCUTANEOUS | Status: DC

## 2014-06-07 MED ORDER — BUDESONIDE 0.25 MG/2ML IN SUSP
0.2500 mg | Freq: Two times a day (BID) | RESPIRATORY_TRACT | Status: DC
  Administered 2014-06-07 – 2014-06-08 (×2): 0.25 mg via RESPIRATORY_TRACT
  Filled 2014-06-07 (×5): qty 2

## 2014-06-07 MED ORDER — ALPRAZOLAM 0.25 MG PO TABS
0.2500 mg | ORAL_TABLET | Freq: Three times a day (TID) | ORAL | Status: DC | PRN
  Administered 2014-06-07: 0.25 mg via ORAL
  Filled 2014-06-07: qty 1

## 2014-06-07 MED ORDER — PANTOPRAZOLE SODIUM 40 MG PO TBEC
40.0000 mg | DELAYED_RELEASE_TABLET | Freq: Every day | ORAL | Status: DC
  Administered 2014-06-07 – 2014-06-21 (×12): 40 mg via ORAL
  Filled 2014-06-07 (×13): qty 1

## 2014-06-07 MED ORDER — ALPRAZOLAM 0.25 MG PO TABS: 0.2500 mg | ORAL_TABLET | Freq: Two times a day (BID) | ORAL | Status: DC | PRN

## 2014-06-07 MED ORDER — ACETAMINOPHEN 325 MG PO TABS: 650.0000 mg | ORAL_TABLET | Freq: Four times a day (QID) | ORAL | Status: DC | PRN

## 2014-06-07 MED ORDER — ACETAMINOPHEN 650 MG RE SUPP: 650.0000 mg | Freq: Four times a day (QID) | RECTAL | Status: DC | PRN

## 2014-06-07 MED ORDER — ARFORMOTEROL TARTRATE 15 MCG/2ML IN NEBU
15.0000 ug | INHALATION_SOLUTION | Freq: Two times a day (BID) | RESPIRATORY_TRACT | Status: DC
  Administered 2014-06-07 – 2014-06-24 (×32): 15 ug via RESPIRATORY_TRACT
  Filled 2014-06-07 (×40): qty 2

## 2014-06-07 MED ORDER — IPRATROPIUM-ALBUTEROL 0.5-2.5 (3) MG/3ML IN SOLN: 3.0000 mL | Freq: Four times a day (QID) | RESPIRATORY_TRACT | Status: DC

## 2014-06-07 NOTE — Consult Note (Signed)
PULMONARY / CRITICAL CARE MEDICINE   Name: [name] MRN: [id number] DOB: [birth year]    ADMISSION DATE:  06/07/2014 CONSULTATION DATE:  06/07/2014  REFERRING MD :  Dr. [name]  CHIEF COMPLAINT:  Lower extremity edema  INITIAL PRESENTATION: 68 year old female with recent admissions for pericardial effusions and recent pericardial window, presented to [facility] office 12/22 for f/u OV. She was noted to have severe lower extremity edema. Echo was ordered and showed a recurrence of effusion and early tamponade physiology. She was admitted to their service. PCCM to consult.    STUDIES:  Spirometry Nov 2012: Fev1 0.85L/45% 2D Echo 12/22 >  Mod LVH, LVEF 55-60%, Grade 1 DD. PA peak pressure 75 mmHg. Large pericardial effusion, c/w early tamponade.   SIGNIFICANT EVENTS:    HISTORY OF PRESENT ILLNESS:  68 year old female with PMH which includes severe COPD (MR office pt), recurrent pericardial effusions, DM, and rest as below. She has had recent admission for pericardial effusion. She had a pericardial window 11/25. Although she was not in tamponade at that time, the window did help her to come off pressors and mechanical ventilation and she was initially discharged. She had another recent admission 12/4 for COPD exacerbation during which she required intubation. She improved and was discharged 12/14. 12/22 she presented to [facility] for hospital follow up. She was seen by Dr. [name] who noted that she had profound lower extremity edema. CXR showed enlarged cardiac silhouette which prompted a 2D echo. Echo showed large effusion with findings consistent with early tamponade. She was admitted from office to [facility]. PCCM consulted.    PAST MEDICAL HISTORY :   has a past medical history of Asthma; COPD (chronic obstructive pulmonary disease); Hypertension; Diabetes mellitus (09/13/2010); Gout; Oxygen dependent; Pericardial effusion (08/2011); Hyperlipidemia; Type II or unspecified type  diabetes mellitus with neurological manifestations, uncontrolled; Personal history of noncompliance with medical treatment, presenting hazards to health; Unspecified disease of pericardium; Hemorrhage of rectum and anus; Panic disorder without agoraphobia; Depressive disorder, not elsewhere classified; Edema; Anxiety state, unspecified; Unspecified hereditary and idiopathic peripheral neuropathy; Type II or unspecified type diabetes mellitus without mention of complication, not stated as uncontrolled; Other and unspecified hyperlipidemia; Obesity, unspecified; Unspecified tinnitus; Unspecified essential hypertension; Pneumonia, organism unspecified; Chronic obstructive asthma, unspecified; Acute and chronic respiratory failure; Urinary frequency; Urgency of urination; and Anemia due to chronic blood loss (01/04/2013).  has past surgical history that includes Breast biopsy; Total abdominal hysterectomy; Esophagogastroduodenoscopy (N/A, 01/03/2013); Colonoscopy (N/A, 01/05/2013); Subxyphoid pericardial window (N/A, 05/11/2014); and TEE without cardioversion (N/A, 05/11/2014). Prior to Admission medications   Medication Sig Start Date End Date Taking? Authorizing Provider  albuterol (PROVENTIL) (2.5 MG/3ML) 0.083% nebulizer solution Take 3 mLs (2.5 mg total) by nebulization every 3 (three) hours as needed for wheezing or shortness of breath. 05/30/14   Marijean Heath, NP  ALPRAZolam Duanne Moron) 0.25 MG tablet Take 1 tablet (0.25 mg total) by mouth 2 (two) times daily as needed for anxiety. 05/30/14   Lucious Groves, DO  arformoterol (BROVANA) 15 MCG/2ML NEBU Take 2 mLs (15 mcg total) by nebulization 2 (two) times daily. 05/30/14   Marijean Heath, NP  budesonide (PULMICORT) 0.25 MG/2ML nebulizer solution Take 2 mLs (0.25 mg total) by nebulization 2 (two) times daily. 06/06/14   Tammy S Parrett, NP  dextromethorphan-guaiFENesin (MUCINEX DM) 30-600 MG per 12 hr tablet Take 1 tablet by mouth 2 (two) times  daily. 05/30/14   Marijean Heath, NP  feeding supplement, [place]  SHAKE, (GLUCERNA SHAKE) LIQD Take 237 mLs by mouth 3 (three) times daily between meals. 05/30/14   Marijean Heath, NP  hydrALAZINE (APRESOLINE) 25 MG tablet Take 1 tablet (25 mg total) by mouth every 8 (eight) hours. 01/21/14   [name] L Reed, DO  insulin aspart (NOVOLOG) 100 UNIT/ML injection SQ qhs   CBG 70 - 120: 0 units CBG 121 - 150: 0 units CBG 151 - 200: 0 units CBG 201 - 250: 2 units CBG 251 - 300: 3 units CBG 301 - 350: 4 units CBG 351 - 400: 5 units CBG > 400: call MD 05/30/14   Marijean Heath, NP  insulin aspart (NOVOLOG) 100 UNIT/ML injection SQ TID with meals   CBG 70 - 120: 0 units CBG 121 - 150: 2 units CBG 151 - 200: 3 units CBG 201 - 250: 5 units CBG 251 - 300: 8 units CBG 301 - 350: 11 units CBG 351 - 400: 15 units CBG > 400: call MD 05/30/14   Marijean Heath, NP  ipratropium-albuterol (DUONEB) 0.5-2.5 (3) MG/3ML SOLN Take 3 mLs by nebulization 4 (four) times daily. 05/30/14   Marijean Heath, NP  lovastatin (MEVACOR) 40 MG tablet Take 1 tablet (40 mg total) by mouth at bedtime. For hyperlipedemia 10/27/13   [name] L Reed, DO  Multiple Vitamins-Minerals (MULTIVITAMIN ADULTS 50+) TABS Take 1 tablet by mouth daily.    Historical Provider, MD  pantoprazole (PROTONIX) 40 MG tablet Take 1 tablet (40 mg total) by mouth daily. 02/16/14   Jerene Bears, MD  predniSONE (DELTASONE) 10 MG tablet 2 tabs po daily x 5 days then 1 tab po daily x 5 days then stop 05/30/14   Marijean Heath, NP  promethazine-codeine (PHENERGAN WITH CODEINE) 6.25-10 MG/5ML syrup Take 5 mLs by mouth every 6 (six) hours as needed for cough. 06/02/14   Lauree Chandler, NP  sertraline (ZOLOFT) 50 MG tablet Take 1 tablet (50 mg total) by mouth at bedtime. 01/21/14   [name] L Reed, DO   Allergies  Allergen Reactions  . Lisinopril Swelling    Angioedema   . Advair Diskus [Fluticasone-Salmeterol] Other (See  Comments)    Nose bleeds.   . Penicillins     hives  . Levaquin [Levofloxacin] Other (See Comments)    FAMILY HISTORY:  indicated that her mother is deceased. She indicated that her father is deceased. She indicated that both of her sisters are alive. She indicated that both of her brothers are alive. She indicated that her daughter is alive. She indicated that her son is alive.  SOCIAL HISTORY:  reports that she quit smoking about 9 years ago. Her smoking use included Cigarettes. She has a 30 pack-year smoking history. She has never used smokeless tobacco. She reports that she drinks about 1.0 oz of alcohol per week. She reports that she does not use illicit drugs.  REVIEW OF SYSTEMS:   Bolds are positive  Constitutional: weight loss, gain, night sweats, Fevers, chills, fatigue .  HEENT: headaches, Sore throat, sneezing, nasal congestion, post nasal drip, Difficulty swallowing, Tooth/dental problems, visual complaints visual changes, ear ache CV:  chest pain, radiates: ,Orthopnea, PND, swelling in lower extremities, dizziness, palpitations, syncope.  GI  heartburn, indigestion, abdominal pain, nausea, vomiting, diarrhea, change in bowel habits, loss of appetite, bloody stools.  Resp: cough, productive for occasional clear/yellow sputum. hemoptysis, dyspnea, chest pain, pleuritic.  Skin: rash or itching or icterus GU: dysuria, change in color of urine, urgency or  frequency. flank pain, hematuria  MS: joint pain or swelling. decreased range of motion  Psych: change in mood or affect. depression or anxiety.  Neuro: difficulty with speech, weakness, numbness, ataxia    SUBJECTIVE:   VITAL SIGNS: Temp:  [97.9 F (36.6 C)] 97.9 F (36.6 C) (12/22 1747) Pulse Rate:  [98-99] 99 (12/22 1747) Resp:  [16-21] 21 (12/22 1747) BP: (173)/(98-99) 173/99 mmHg (12/22 1747) SpO2:  [91 %-100 %] 100 % (12/22 1747) Weight:  [83.462 kg (184 lb)] 83.462 kg (184 lb) (12/22 1230) HEMODYNAMICS:    VENTILATOR SETTINGS:   INTAKE / OUTPUT:  Intake/Output Summary (Last 24 hours) at 06/07/14 1801 Last data filed at 06/07/14 1750  Gross per 24 hour  Intake      0 ml  Output    200 ml  Net   -200 ml    PHYSICAL EXAMINATION: General:  Elderly female with mild respiratory distress Neuro:  Alert, oriented, non-focal HEENT:  [place]/AT, PERRL, mild-moderate JVD noted Cardiovascular:  RRR, no MRG Lungs:  Poor air movement, R > L. Scant wheeze, mostly clear breath sounds bilaterally.  Abdomen:  Soft, non-tender, non-distended Musculoskeletal:  +3/4 pitting BLE edema R>L. No deformity.  Skin:  Intact  LABS:  CBC No results for input(s): WBC, HGB, HCT, PLT in the last 168 hours. Coag's No results for input(s): APTT, INR in the last 168 hours. BMET No results for input(s): NA, K, CL, CO2, BUN, CREATININE, GLUCOSE in the last 168 hours. Electrolytes No results for input(s): CALCIUM, MG, PHOS in the last 168 hours. Sepsis Markers No results for input(s): LATICACIDVEN, PROCALCITON, O2SATVEN in the last 168 hours. ABG No results for input(s): PHART, PCO2ART, PO2ART in the last 168 hours. Liver Enzymes No results for input(s): AST, ALT, ALKPHOS, BILITOT, ALBUMIN in the last 168 hours. Cardiac Enzymes No results for input(s): TROPONINI, PROBNP in the last 168 hours. Glucose No results for input(s): GLUCAP in the last 168 hours.  Imaging No results found.   ASSESSMENT / PLAN:  PULMONARY A: Chronic respiratory failure 2nd to severe COPD without acute exacerbation (on 2L at home) RLL infiltrate vs ATX Mild pulmonary edema  P:   Continue supplemental O2 at 2L (home) Continue preadmission brovana, budesonide PRN Duoneb Cough supression DNR/DNI, may revise for surgery if cards will not drain effusion  CARDIOVASCULAR A:  Pericardial effusion, concern for early tamponade HTN  P:  Managment per cardiology/[facility] She is refusing surgery out of fear of being  intubated Cardiology consult to determine if effusion can be perc drained in cath lab.  If not she may consider revising code status for surgery. Could maybe benefit from gentle diuresis  RENAL A:   No known issues  P:   Bmet pending Strict I&O  GASTROINTESTINAL A:   GERD  P:   SUP: PO protonix NPO per [facility]  HEMATOLOGIC A:   No acute issues  P:  CBC, coags pending Hold VTE chemoprophylaxis in setting of effusion SCDs  INFECTIOUS A:   No acute issues  P:   Monitor off ABX   ENDOCRINE A:  DM  P:   CBG monitoring SSI  NEUROLOGIC A:   No acute issues  P:   RASS goal: 0 Monitor   FAMILY  - Updates: No family at bedside. Daughter updated by [facility] 12/22  - Inter-disciplinary family meet or Palliative Care meeting due by:  12/29   Georgann Housekeeper, ACNP [place] [facility] Pager [phone] or [phone]   Attending: I agree,  saw patient 12/23 AM, see my note from 12/23 Roselie Awkward, MD [place] [facility] Pager: [phone] Cell: [phone] If no response, call [phone]

## 2014-06-07 NOTE — Progress Notes (Signed)
  Echocardiogram 2D Echocardiogram has been performed.  Darlina Sicilian M 06/07/2014, 3:53 PM

## 2014-06-07 NOTE — Consult Note (Signed)
Consulting cardiologist: Dr Carlyle Dolly MD    Clinical Summary [name] is a 68 y.o.female hx of severe COPD with prior admits with respiratory failure, HTN, DM2, HL, and recent history of pericardial effusion s/p subxiphoid window 05/12/14 for large effusion without over tamponade. Seen for CT surg follow today in clinic, noted to have worsening LE edema and some JVD. A repeat echo was ordered and showed recurrence of her effusion.    Echo 06/07/14 LVEF 55-60%, moderate LVH, grade I diastolic dysfunction, PASP 75, normal RV Large effusion. RA indentation without full collapose, dilated IVC, positive respiratory variation, no RV collapse.   Cardiology is consulted for possible percardicentesis. Patient is not interested in repeat surgical procedure as she does not want to be intubated.   Allergies  Allergen Reactions  . Lisinopril Swelling    Angioedema   . Advair Diskus [Fluticasone-Salmeterol] Other (See Comments)    Nose bleeds.   . Penicillins     hives  . Levaquin [Levofloxacin] Hives    White marks inside mouth    Medications Scheduled Medications: . arformoterol  15 mcg Nebulization BID  . budesonide (PULMICORT) nebulizer solution  0.25 mg Nebulization BID  . hydrALAZINE  25 mg Oral 3 times per day  . [START ON 06/08/2014] insulin aspart  0-20 Units Subcutaneous TID WC  . multivitamin with minerals  1 tablet Oral Daily  . pantoprazole  40 mg Oral Daily  . sertraline  50 mg Oral QHS  . sodium chloride  3 mL Intravenous Q12H     Infusions: . dextrose 5 % and 0.9% NaCl       PRN Medications:  acetaminophen **OR** acetaminophen, albuterol, ALPRAZolam, dextromethorphan-guaiFENesin, ondansetron **OR** ondansetron (ZOFRAN) IV   Past Medical History  Diagnosis Date  . Asthma   . COPD (chronic obstructive pulmonary disease)   . Hypertension   . Diabetes mellitus 09/13/2010    glucophage  . Gout   . Oxygen dependent     pt uses O2   . Pericardial  effusion 08/2011    small residual on echo  . Hyperlipidemia   . Type II or unspecified type diabetes mellitus with neurological manifestations, uncontrolled   . Personal history of noncompliance with medical treatment, presenting hazards to health   . Unspecified disease of pericardium   . Hemorrhage of rectum and anus   . Panic disorder without agoraphobia   . Depressive disorder, not elsewhere classified   . Edema   . Anxiety state, unspecified   . Unspecified hereditary and idiopathic peripheral neuropathy   . Type II or unspecified type diabetes mellitus without mention of complication, not stated as uncontrolled   . Other and unspecified hyperlipidemia   . Obesity, unspecified   . Unspecified tinnitus   . Unspecified essential hypertension   . Pneumonia, organism unspecified   . Chronic obstructive asthma, unspecified   . Acute and chronic respiratory failure   . Urinary frequency   . Urgency of urination   . Anemia due to chronic blood loss 01/04/2013    Past Surgical History  Procedure Laterality Date  . Breast biopsy      both breasts  . Total abdominal hysterectomy    . Esophagogastroduodenoscopy N/A 01/03/2013    Procedure: ESOPHAGOGASTRODUODENOSCOPY (EGD);  Surgeon: Inda Castle, MD;  Location: [facility];  Service: Endoscopy;  Laterality: N/A;  . Colonoscopy N/A 01/05/2013    Procedure: COLONOSCOPY;  Surgeon: Gatha Mayer, MD;  Location: [facility];  Service: Endoscopy;  Laterality: N/A;  . Subxyphoid pericardial window N/A 05/11/2014    Procedure: SUBXYPHOID PERICARDIAL WINDOW;  Surgeon: Melrose Nakayama, MD;  Location: [facility];  Service: Thoracic;  Laterality: N/A;  . Tee without cardioversion N/A 05/11/2014    Procedure: TRANSESOPHAGEAL ECHOCARDIOGRAM (TEE);  Surgeon: Melrose Nakayama, MD;  [facility];  Service: Thoracic;  Laterality: N/A;    Family History  Problem Relation Age of Onset  . COPD Mother   . COPD Sister   . Hypertension Sister    . Cancer Father   . Asthma Father   . Diabetes Other     3 maternal aunts  . Prostate cancer Paternal Uncle     Social History [name] reports that she quit smoking about 9 years ago. Her smoking use included Cigarettes. She has a 30 pack-year smoking history. She has never used smokeless tobacco. [name] reports that she drinks about 1.0 oz of alcohol per week.  Review of Systems CONSTITUTIONAL: No weight loss, fever, chills, weakness or fatigue.  HEENT: Eyes: No visual loss, blurred vision, double vision or yellow sclerae. No hearing loss, sneezing, congestion, runny nose or sore throat.  SKIN: No rash or itching.  CARDIOVASCULAR: No chest pain, no palpitations  RESPIRATORY:+SOB GASTROINTESTINAL: No anorexia, nausea, vomiting or diarrhea. No abdominal pain or blood.  GENITOURINARY: no polyuria, no dysuria NEUROLOGICAL: No headache, dizziness, syncope, paralysis, ataxia, numbness or tingling in the extremities. No change in bowel or bladder control.  MUSCULOSKELETAL: No muscle, back pain, joint pain or stiffness.  HEMATOLOGIC: No anemia, bleeding or bruising.  LYMPHATICS: No enlarged nodes. No history of splenectomy.  PSYCHIATRIC: No history of depression or anxiety.      Physical Examination Blood pressure 171/94, pulse 95, temperature 98.1 F (36.7 C), temperature source Oral, resp. rate 28, SpO2 100 %.  Intake/Output Summary (Last 24 hours) at 06/07/14 1941 Last data filed at 06/07/14 1900  Gross per 24 hour  Intake      0 ml  Output    550 ml  Net   -550 ml    HEENT: sclera clear  Cardiovascular: RRR, no m/r/g. +JVD to angle of jaw  Respiratory: decrease breath sounds bilatearal bases  GI: abdomen soft, NT, ND  MSK: 2+ bilateral edema  Neuro: no focal deficits     Lab Results  Basic Metabolic Panel: No results for input(s): NA, K, CL, CO2, GLUCOSE, BUN, CREATININE, CALCIUM, MG, PHOS in the last 168 hours.  Liver Function Tests: No  results for input(s): AST, ALT, ALKPHOS, BILITOT, PROT, ALBUMIN in the last 168 hours.  CBC: No results for input(s): WBC, NEUTROABS, HGB, HCT, MCV, PLT in the last 168 hours.  Cardiac Enzymes: No results for input(s): CKTOTAL, CKMB, CKMBINDEX, TROPONINI in the last 168 hours.  BNP: Invalid input(s): POCBNP     Impression/Recommendations 1. Pericardial effusion - recurrent large effusion, echo with signs of developing tamponade physiology. Clinically her heart rates are stable and she is hypertensive. Her pulmonary HTN (PASP 70s) likely is somewhat protective in this setting preventing right chamber collapse - patient does not want to be intubated, and therefore does not want repeat surgical drainage - will have interventional cardiology evaluation in AM. Will make NPO in case they feel she is candidate for pericardiocentesis.  -avoid diuretics so as not to drop her filling pressures   Carlyle Dolly, M.D.,

## 2014-06-07 NOTE — Progress Notes (Signed)
HPI:  [name] presents today for a scheduled postoperative follow-up visit.  She is a 68 year old woman with severe COPD who had a subxiphoid pericardial window for pericardial effusion on 05/11/2014. She had a large effusion but was not really in tamponade. However after draining the effusion, she was able to be weaned from the ventilator and discharged from the hospital. Reportedly she was readmitted with a COPD flare not long after that and that was just discharged from the hospital again 8 days ago. She currently is at [facility].  She says that since her discharge from the hospital she has not noticed any worsening of her breathing. However she has noted some swelling over the past couple of days in both ankles.  Past Medical History  Diagnosis Date  . Asthma   . COPD (chronic obstructive pulmonary disease)   . Hypertension   . Diabetes mellitus 09/13/2010    glucophage  . Gout   . Oxygen dependent     pt uses O2   . Pericardial effusion 08/2011    small residual on echo  . Hyperlipidemia   . Type II or unspecified type diabetes mellitus with neurological manifestations, uncontrolled   . Personal history of noncompliance with medical treatment, presenting hazards to health   . Unspecified disease of pericardium   . Hemorrhage of rectum and anus   . Panic disorder without agoraphobia   . Depressive disorder, not elsewhere classified   . Edema   . Anxiety state, unspecified   . Unspecified hereditary and idiopathic peripheral neuropathy   . Type II or unspecified type diabetes mellitus without mention of complication, not stated as uncontrolled   . Other and unspecified hyperlipidemia   . Obesity, unspecified   . Unspecified tinnitus   . Unspecified essential hypertension   . Pneumonia, organism unspecified   . Chronic obstructive asthma, unspecified   . Acute and chronic respiratory failure   . Urinary frequency   . Urgency of urination   . Anemia due to  chronic blood loss 01/04/2013      Current Outpatient Prescriptions  Medication Sig Dispense Refill  . albuterol (PROVENTIL) (2.5 MG/3ML) 0.083% nebulizer solution Take 3 mLs (2.5 mg total) by nebulization every 3 (three) hours as needed for wheezing or shortness of breath. 75 mL 12  . ALPRAZolam (XANAX) 0.25 MG tablet Take 1 tablet (0.25 mg total) by mouth 2 (two) times daily as needed for anxiety. 30 tablet 0  . arformoterol (BROVANA) 15 MCG/2ML NEBU Take 2 mLs (15 mcg total) by nebulization 2 (two) times daily. 120 mL   . budesonide (PULMICORT) 0.25 MG/2ML nebulizer solution Take 2 mLs (0.25 mg total) by nebulization 2 (two) times daily. 120 mL 12  . dextromethorphan-guaiFENesin (MUCINEX DM) 30-600 MG per 12 hr tablet Take 1 tablet by mouth 2 (two) times daily.    . feeding supplement, GLUCERNA SHAKE, (GLUCERNA SHAKE) LIQD Take 237 mLs by mouth 3 (three) times daily between meals.  0  . hydrALAZINE (APRESOLINE) 25 MG tablet Take 1 tablet (25 mg total) by mouth every 8 (eight) hours. 90 tablet 3  . insulin aspart (NOVOLOG) 100 UNIT/ML injection SQ qhs   CBG 70 - 120: 0 units CBG 121 - 150: 0 units CBG 151 - 200: 0 units CBG 201 - 250: 2 units CBG 251 - 300: 3 units CBG 301 - 350: 4 units CBG 351 - 400: 5 units CBG > 400: call MD 10 mL 11  . insulin aspart (NOVOLOG)  100 UNIT/ML injection SQ TID with meals   CBG 70 - 120: 0 units CBG 121 - 150: 2 units CBG 151 - 200: 3 units CBG 201 - 250: 5 units CBG 251 - 300: 8 units CBG 301 - 350: 11 units CBG 351 - 400: 15 units CBG > 400: call MD 10 mL 11  . ipratropium-albuterol (DUONEB) 0.5-2.5 (3) MG/3ML SOLN Take 3 mLs by nebulization 4 (four) times daily. 360 mL   . lovastatin (MEVACOR) 40 MG tablet Take 1 tablet (40 mg total) by mouth at bedtime. For hyperlipedemia 30 tablet 3  . Multiple Vitamins-Minerals (MULTIVITAMIN ADULTS 50+) TABS Take 1 tablet by mouth daily.    . pantoprazole (PROTONIX) 40 MG tablet Take 1 tablet (40 mg total)  by mouth daily. 30 tablet 3  . predniSONE (DELTASONE) 10 MG tablet 2 tabs po daily x 5 days then 1 tab po daily x 5 days then stop    . promethazine-codeine (PHENERGAN WITH CODEINE) 6.25-10 MG/5ML syrup Take 5 mLs by mouth every 6 (six) hours as needed for cough. 240 mL 0  . sertraline (ZOLOFT) 50 MG tablet Take 1 tablet (50 mg total) by mouth at bedtime. 30 tablet 3   No current facility-administered medications for this visit.    Physical Exam BP 173/98 mmHg  Pulse 98  Resp 16  Ht 5' 2.5" (1.588 m)  Wt 184 lb (83.462 kg)  BMI 33.10 kg/m2  SpO60 42% 68 year old woman, frail-appearing, but in no acute distress Alert and oriented 3 Lungs distant breath sounds bilaterally Cardiac regular rate and rhythm no rubs 2-3+ edema both lower extremities Incision clean dry and intact  Diagnostic Tests: CHEST 2 VIEW  COMPARISON: Portable chest x-ray of May 24, 2014.  FINDINGS: The cardiopericardial silhouette remains mildly enlarged. There remains prominence of the central pulmonary vascularity. There are small pleural effusions layering inferiorly. The pulmonary vascularity exhibits no significant cephalization. The trachea is midline. The bony thorax is unremarkable.  IMPRESSION: There is mild enlargement of the cardiac silhouette with central pulmonary vascular prominence without cephalization consistent with compensated low-grade CHF. There are small bilateral pleural effusions not greatly changed from the previous study. There is no definite pneumonia.   Electronically Signed  By: David Martinique  On: 06/07/2014 12:22   Impression: 68 year old woman who is now almost a month out from a subxiphoid pericardial window. Her incision is healing well and her respiratory status is stable. However she does have rather significant pitting edema in both lower extremities and her neck veins are mildly distended. This could be volume overload that could be treated with  diuretics. However, her chest x-ray does show an increase in the size of the cardiac silhouette. Some of this could be the difference between AP portable versus PA and lateral views. However I think she needs a 2-D echocardiogram to rule out recurrent pericardial effusion. Will see if we can get that done today. If it has recurred we'll have to discuss pericardial window possibly through a VATS approach versus pericardiocentesis. She says that she does not want to be intubated under any circumstance. However, if there is a significant recurrence we may have to discuss been intubated for surgical procedure with her.  Plan:  2-D echocardiogram.

## 2014-06-07 NOTE — H&P (Signed)
[name]            [place] [address]          [place] is an 68 y.o. female. Oct 18, 1945 GHW:[id number]   Chief Complaint: Increased ankle swelling  History of Presenting Illness:  This is a 68 year old African American woman with severe COPD who had a subxiphoid pericardial window for pericardial effusion on 05/11/2014. At that time, she had a large effusion but was not really in tamponade.  After draining the effusion, she was able to be weaned from the ventilator and discharged from the hospital. Reportedly, she was readmitted with a COPD flare not long after that and tent was just discharged from the hospital again 8 days ago. She currently is at [facility] She was seen in the office today by Dr. [name]. She says that since her discharge from the hospital, she has not noticed any worsening of her breathing;however, she has noted some swelling over the past couple of days in both ankles. Dr. [name] noticed the pitting edema of bilateral lower extremities and mildly distended neck veins. Her chest x ray also showed an increase in the size of the cardiac silhouette. He ordered an echo to be done at [facility]. This showed a large pericardial effusion and tamponade. After a long discussion with both patient and daughter, she decided to be admitted.  Past Medical History: Past Medical History  Diagnosis Date  . Asthma   . COPD (chronic obstructive pulmonary disease)   . Hypertension   . Diabetes mellitus 09/13/2010    glucophage  . Gout   . Oxygen dependent     pt uses O2   . Pericardial effusion 08/2011    small residual on echo  . Hyperlipidemia   . Type II or unspecified type diabetes mellitus with neurological manifestations, uncontrolled   . Personal history of noncompliance with medical treatment, presenting hazards to health   . Unspecified disease of pericardium   . Hemorrhage of rectum and  anus   . Panic disorder without agoraphobia   . Depressive disorder, not elsewhere classified   . Edema   . Anxiety state, unspecified   . Unspecified hereditary and idiopathic peripheral neuropathy   . Type II or unspecified type diabetes mellitus without mention of complication, not stated as uncontrolled   . Other and unspecified hyperlipidemia   . Obesity, unspecified   . Unspecified tinnitus   . Unspecified essential hypertension   . Pneumonia, organism unspecified   . Chronic obstructive asthma, unspecified   . Acute and chronic respiratory failure   . Urinary frequency   . Urgency of urination   . Anemia due to chronic blood loss 01/04/2013    Past Surgical History: Past Surgical History  Procedure Laterality Date  . Breast biopsy      both breasts  . Total abdominal hysterectomy    . Esophagogastroduodenoscopy N/A 01/03/2013    Procedure: ESOPHAGOGASTRODUODENOSCOPY (EGD);  Surgeon: Inda Castle, MD;  Location: [facility];  Service: Endoscopy;  Laterality: N/A;  . Colonoscopy N/A 01/05/2013    Procedure: COLONOSCOPY;  Surgeon: Gatha Mayer, MD;  Location: [facility];  Service: Endoscopy;  Laterality: N/A;  . Subxyphoid pericardial window N/A 05/11/2014    Procedure: SUBXYPHOID PERICARDIAL WINDOW;  Surgeon: Melrose Nakayama, MD;  Location: [facility];  Service: Thoracic;  Laterality: N/A;  . Tee without cardioversion N/A 05/11/2014    Procedure: TRANSESOPHAGEAL ECHOCARDIOGRAM (TEE);  Surgeon: Melrose Nakayama, MD;  Location: [facility] OR;  Service: Thoracic;  Laterality: N/A;    Family History: Family History  Problem Relation Age of Onset  . COPD Mother   . COPD Sister   . Hypertension Sister   . Cancer Father   . Asthma Father   . Diabetes Other     3 maternal aunts  . Prostate cancer Paternal Uncle     Social History:   reports that she quit smoking about 9 years ago. Her smoking use included Cigarettes. She has a 30 pack-year smoking history. She has never  used smokeless tobacco. She reports that she drinks about 1.0 oz of alcohol per week. She reports that she does not use illicit drugs.  Allergies:  Allergies  Allergen Reactions  . Lisinopril Swelling    Angioedema   . Advair Diskus [Fluticasone-Salmeterol] Other (See Comments)    Nose bleeds.   . Penicillins     hives  . Levaquin [Levofloxacin] Other (See Comments)    Medications Prior to Admission  Medication Sig Dispense Refill  . albuterol (PROVENTIL) (2.5 MG/3ML) 0.083% nebulizer solution Take 3 mLs (2.5 mg total) by nebulization every 3 (three) hours as needed for wheezing or shortness of breath. 75 mL 12  . ALPRAZolam (XANAX) 0.25 MG tablet Take 1 tablet (0.25 mg total) by mouth 2 (two) times daily as needed for anxiety. 30 tablet 0  . arformoterol (BROVANA) 15 MCG/2ML NEBU Take 2 mLs (15 mcg total) by nebulization 2 (two) times daily. 120 mL   . budesonide (PULMICORT) 0.25 MG/2ML nebulizer solution Take 2 mLs (0.25 mg total) by nebulization 2 (two) times daily. 120 mL 12  . dextromethorphan-guaiFENesin (MUCINEX DM) 30-600 MG per 12 hr tablet Take 1 tablet by mouth 2 (two) times daily.    . feeding supplement, GLUCERNA SHAKE, (GLUCERNA SHAKE) LIQD Take 237 mLs by mouth 3 (three) times daily between meals.  0  . hydrALAZINE (APRESOLINE) 25 MG tablet Take 1 tablet (25 mg total) by mouth every 8 (eight) hours. 90 tablet 3  . insulin aspart (NOVOLOG) 100 UNIT/ML injection SQ qhs   CBG 70 - 120: 0 units CBG 121 - 150: 0 units CBG 151 - 200: 0 units CBG 201 - 250: 2 units CBG 251 - 300: 3 units CBG 301 - 350: 4 units CBG 351 - 400: 5 units CBG > 400: call MD 10 mL 11  . insulin aspart (NOVOLOG) 100 UNIT/ML injection SQ TID with meals   CBG 70 - 120: 0 units CBG 121 - 150: 2 units CBG 151 - 200: 3 units CBG 201 - 250: 5 units CBG 251 - 300: 8 units CBG 301 - 350: 11 units CBG 351 - 400: 15 units CBG > 400: call MD 10 mL 11  . ipratropium-albuterol (DUONEB) 0.5-2.5 (3)  MG/3ML SOLN Take 3 mLs by nebulization 4 (four) times daily. 360 mL   . lovastatin (MEVACOR) 40 MG tablet Take 1 tablet (40 mg total) by mouth at bedtime. For hyperlipedemia 30 tablet 3  . Multiple Vitamins-Minerals (MULTIVITAMIN ADULTS 50+) TABS Take 1 tablet by mouth daily.    . pantoprazole (PROTONIX) 40 MG tablet Take 1 tablet (40 mg total) by mouth daily. 30 tablet 3  . predniSONE (DELTASONE) 10 MG tablet 2 tabs po daily x 5 days then 1 tab po daily x 5 days then stop    .  promethazine-codeine (PHENERGAN WITH CODEINE) 6.25-10 MG/5ML syrup Take 5 mLs by mouth every 6 (six) hours as needed for cough. 240 mL 0  . sertraline (ZOLOFT) 50 MG tablet Take 1 tablet (50 mg total) by mouth at bedtime. 30 tablet 3     Review of Systems:  Cardiac Review of Systems: Y or N  Chest Pain [ N ] Resting SOB [ Y ] Exertional SOB [ Y ]  Pedal Edema [Y ] Palpitations Aqua.Slicker ] Syncope Aqua.Slicker ] Presyncope [ N ]  General Review of Systems: [Y] = yes [ N]=no  Constitional: recent weight change [ N]; anorexia Aqua.Slicker ]; fatigue [Y ]; nausea Aqua.Slicker ]; night sweats Aqua.Slicker ]; fever Aqua.Slicker ]; or chills Aqua.Slicker ];  Eye : blurred vision [ N]; diplopia Aqua.Slicker ]; vision changes Aqua.Slicker ]; Amaurosis fugax[N ];  Resp: cough [ N]; wheezing[N ]; hemoptysis[N ] GI: gallstones[ N], vomiting[N ]; dysphagia[N ]; melena[N ]; hematochezia [ N]; heartburn[ N];  GU: kidney stones Aqua.Slicker ]; hematuria[N ]; dysuria Aqua.Slicker ]; nocturia[N ]; history of obstruction [ N]; urgency [Y] Skin: rash, swelling[N ];, hair loss[N ] or itching[ N];  Musculosketetal: myalgias[N ]; joint swelling[ N]; joint erythema[ N]; joint pain[N ]; back pain[N ];  Heme/Lymph: bruising[ N]; bleeding[N ]; anemia[ Y];  Neuro: TIA[N ]; headaches[ N]; stroke[N ]; vertigo[ N]; seizures[ N]; Psych:depression[Y ]; anxiety[ Y];  Endocrine: diabetes[Y ]; thyroid dysfunction[N ];   Vital Signs: Temp 97.9 (36.6), RR 21, HR 99, BP 173/89, O2 sat 100%  Physical Exam: General:Alert and oriented 3, on 3 liters of  oxygen via [place] (oxygen dependent) Pulmonary: distant breath sounds bilaterally Cardiac: regular rate and rhythm no rubs Extremities:2-3+ edema both lower extremities Incision: clean dry and intact   Diagnostic Studies and Laboratory Results: No results found for this or any previous visit (from the past 48 hour(s)). Dg Chest 2 View  06/07/2014   CLINICAL DATA:  Shortness of breath and weakness today; history of pericardial effusion with creation of pericardial window on May 11, 2014  EXAM: CHEST  2 VIEW  COMPARISON:  Portable chest x-ray of May 24, 2014.  FINDINGS: The cardiopericardial silhouette remains mildly enlarged. There remains prominence of the central pulmonary vascularity. There are small pleural effusions layering inferiorly. The pulmonary vascularity exhibits no significant cephalization. The trachea is midline. The bony thorax is unremarkable.  IMPRESSION: There is mild enlargement of the cardiac silhouette with central pulmonary vascular prominence without cephalization consistent with compensated low-grade CHF. There are small bilateral pleural effusions not greatly changed from the previous study. There is no definite pneumonia.   Electronically Signed   By: David  Martinique   On: 06/07/2014 12:22     Assessment/Plan 1.Recurrent pericardial effusion with tamponade 2. History of COPD 3. History of DM 4. History of hypertension  ZIMMERMAN,DONIELLE M, PA-C 06/07/2014, 5:37 PM   Patient seen and examined in office earlier today. I just met with her at bedside to discuss the situation Echo showed a recurrent pericardial effusion. Also noted was severe pulmonary hypertension, which was not mentioned on her previous echo from the last admission.  She is currently in no distress and HR= 99 and BP= 173/99  I advised that she needs the pericardial effusion drained surgically. That would involve general anesthesia and a left anterior chest approach to drain into pleural  space. She is adamant that she does not want to be intubated under any circumstance. She says it is torture. I explained that we would get the  tube out as soon as possible but cannot guarantee it will be able to be removed immediately. There is also no guarantee against recurrence. I offered to consult Cardiology to see if they would be willing to try to drain the effusion percutaneously. I am not certain that they would be willing to try, but will ask the question.   She is DNR per her expressed wishes

## 2014-06-07 NOTE — Progress Notes (Signed)
She remains hemodynamically stable  BP 171/94 mmHg  Pulse 95  Temp(Src) 98.1 F (36.7 C) (Oral)  Resp 28  SpO2 100%  She was just seen by Dr. Harl Bowie of Cardiology. He thinks percutaneous drainage is an option. Interventionalist will see her in AM. If they cannot do it percutaneously I will still offer surgery as a backup option should she change her mind re: ETT.

## 2014-06-08 ENCOUNTER — Other Ambulatory Visit: Payer: Self-pay | Admitting: Thoracic Surgery (Cardiothoracic Vascular Surgery)

## 2014-06-08 ENCOUNTER — Encounter (HOSPITAL_COMMUNITY): Admission: RE | Payer: Self-pay | Source: Ambulatory Visit

## 2014-06-08 ENCOUNTER — Encounter (HOSPITAL_COMMUNITY): Payer: Self-pay

## 2014-06-08 ENCOUNTER — Inpatient Hospital Stay (HOSPITAL_COMMUNITY)
Admission: RE | Admit: 2014-06-08 | Payer: Medicare Other | Source: Ambulatory Visit | Admitting: Thoracic Surgery (Cardiothoracic Vascular Surgery)

## 2014-06-08 ENCOUNTER — Inpatient Hospital Stay (HOSPITAL_COMMUNITY): Payer: Medicare Other

## 2014-06-08 ENCOUNTER — Inpatient Hospital Stay (HOSPITAL_COMMUNITY): Payer: Medicare Other | Admitting: Certified Registered"

## 2014-06-08 ENCOUNTER — Encounter (HOSPITAL_COMMUNITY)
Admission: AD | Disposition: A | Discharge status: Skilled Nursing Facility | Payer: Self-pay | Source: Ambulatory Visit | Attending: Pulmonary Disease

## 2014-06-08 DIAGNOSIS — I10 Essential (primary) hypertension: Secondary | ICD-10-CM | POA: Insufficient documentation

## 2014-06-08 DIAGNOSIS — J438 Other emphysema: Secondary | ICD-10-CM | POA: Insufficient documentation

## 2014-06-08 DIAGNOSIS — J449 Chronic obstructive pulmonary disease, unspecified: Secondary | ICD-10-CM | POA: Insufficient documentation

## 2014-06-08 DIAGNOSIS — J9601 Acute respiratory failure with hypoxia: Secondary | ICD-10-CM | POA: Insufficient documentation

## 2014-06-08 HISTORY — PX: PERICARDIAL WINDOW: SHX2213

## 2014-06-08 HISTORY — PX: VIDEO ASSISTED THORACOSCOPY (VATS)/THOROCOTOMY: SHX6173

## 2014-06-08 LAB — PROTEIN, BODY FLUID: Total protein, fluid: 3.1 g/dL

## 2014-06-08 LAB — GLUCOSE, CAPILLARY
GLUCOSE-CAPILLARY: 113 mg/dL — AB (ref 70–99)
GLUCOSE-CAPILLARY: 108 mg/dL — AB (ref 70–99)
Glucose-Capillary: 122 mg/dL — ABNORMAL HIGH (ref 70–99)
Glucose-Capillary: 127 mg/dL — ABNORMAL HIGH (ref 70–99)
Glucose-Capillary: 122 mg/dL — ABNORMAL HIGH (ref 70–99)

## 2014-06-08 LAB — GLUCOSE, RANDOM: GLUCOSE: 109 mg/dL — AB (ref 70–99)

## 2014-06-08 LAB — POCT I-STAT 3, ART BLOOD GAS (G3+)
ACID-BASE EXCESS: 7 mmol/L — AB (ref 0.0–2.0)
Bicarbonate: 34.5 mEq/L — ABNORMAL HIGH (ref 20.0–24.0)
O2 Saturation: 86 %
PCO2 ART: 67.7 mmHg — AB (ref 35.0–45.0)
PO2 ART: 58 mmHg — AB (ref 80.0–100.0)
TCO2: 36 mmol/L (ref 0–100)
pH, Arterial: 7.315 — ABNORMAL LOW (ref 7.350–7.450)

## 2014-06-08 LAB — FUNGUS CULTURE W SMEAR: Fungal Smear: NONE SEEN

## 2014-06-08 LAB — LACTATE DEHYDROGENASE: LDH: 117 U/L (ref 94–250)

## 2014-06-08 SURGERY — VIDEO ASSISTED THORACOSCOPY
Anesthesia: General | Site: Chest

## 2014-06-08 SURGERY — CREATION, PERICARDIAL WINDOW
Anesthesia: General | Site: Chest

## 2014-06-08 MED ORDER — ACETAMINOPHEN 10 MG/ML IV SOLN
INTRAVENOUS | Status: DC | PRN
  Administered 2014-06-08: 1000 mg via INTRAVENOUS

## 2014-06-08 MED ORDER — OXYCODONE HCL 5 MG PO TABS
5.0000 mg | ORAL_TABLET | ORAL | Status: DC | PRN
  Administered 2014-06-09 – 2014-06-18 (×3): 10 mg via ORAL
  Filled 2014-06-08 (×3): qty 2

## 2014-06-08 MED ORDER — CHLORHEXIDINE GLUCONATE 0.12 % MT SOLN
15.0000 mL | Freq: Two times a day (BID) | OROMUCOSAL | Status: DC
  Administered 2014-06-08 – 2014-06-09 (×2): 15 mL via OROMUCOSAL
  Filled 2014-06-08 (×2): qty 15

## 2014-06-08 MED ORDER — ACETAMINOPHEN 10 MG/ML IV SOLN
INTRAVENOUS | Status: AC
  Filled 2014-06-08: qty 100

## 2014-06-08 MED ORDER — ALBUMIN HUMAN 5 % IV SOLN
12.5000 g | Freq: Once | INTRAVENOUS | Status: AC
  Administered 2014-06-08: 12.5 g via INTRAVENOUS
  Filled 2014-06-08: qty 250

## 2014-06-08 MED ORDER — HEMOSTATIC AGENTS (NO CHARGE) OPTIME
TOPICAL | Status: DC | PRN
  Administered 2014-06-08 (×2): 1 via TOPICAL

## 2014-06-08 MED ORDER — SENNOSIDES-DOCUSATE SODIUM 8.6-50 MG PO TABS
1.0000 | ORAL_TABLET | Freq: Every day | ORAL | Status: DC
  Administered 2014-06-09 – 2014-06-23 (×10): 1 via ORAL
  Filled 2014-06-08 (×13): qty 1

## 2014-06-08 MED ORDER — GLYCOPYRROLATE 0.2 MG/ML IJ SOLN
INTRAMUSCULAR | Status: DC | PRN
  Administered 2014-06-08: .6 mg via INTRAVENOUS

## 2014-06-08 MED ORDER — SODIUM CHLORIDE 0.9 % IV SOLN
INTRAVENOUS | Status: DC
  Administered 2014-06-08: 13:00:00 via INTRAVENOUS

## 2014-06-08 MED ORDER — BISACODYL 5 MG PO TBEC
10.0000 mg | DELAYED_RELEASE_TABLET | Freq: Every day | ORAL | Status: DC
  Administered 2014-06-09 – 2014-06-24 (×8): 10 mg via ORAL
  Filled 2014-06-08 (×11): qty 2

## 2014-06-08 MED ORDER — ONDANSETRON HCL 4 MG/2ML IJ SOLN
INTRAMUSCULAR | Status: DC | PRN
  Administered 2014-06-08: 4 mg via INTRAVENOUS

## 2014-06-08 MED ORDER — ACETAMINOPHEN 160 MG/5ML PO SOLN
1000.0000 mg | Freq: Four times a day (QID) | ORAL | Status: AC
  Administered 2014-06-09: 1000 mg via ORAL
  Filled 2014-06-08: qty 40.6

## 2014-06-08 MED ORDER — FENTANYL BOLUS VIA INFUSION
25.0000 ug | INTRAVENOUS | Status: DC | PRN
  Administered 2014-06-08: 25 ug via INTRAVENOUS
  Filled 2014-06-08: qty 25

## 2014-06-08 MED ORDER — CALCIUM GLUCONATE 10 % IV SOLN: 1.0000 g | Freq: Once | INTRAVENOUS | Status: DC

## 2014-06-08 MED ORDER — FENTANYL CITRATE 0.05 MG/ML IJ SOLN: 25.0000 ug | INTRAMUSCULAR | Status: DC | PRN

## 2014-06-08 MED ORDER — ACETAMINOPHEN 500 MG PO TABS
1000.0000 mg | ORAL_TABLET | Freq: Four times a day (QID) | ORAL | Status: AC
  Administered 2014-06-09 – 2014-06-13 (×12): 1000 mg via ORAL
  Filled 2014-06-08 (×18): qty 2

## 2014-06-08 MED ORDER — SUCCINYLCHOLINE CHLORIDE 20 MG/ML IJ SOLN
INTRAMUSCULAR | Status: DC | PRN
  Administered 2014-06-08: 60 mg via INTRAVENOUS

## 2014-06-08 MED ORDER — LACTATED RINGERS IV SOLN
INTRAVENOUS | Status: DC | PRN
  Administered 2014-06-08 (×2): via INTRAVENOUS

## 2014-06-08 MED ORDER — SODIUM CHLORIDE 0.9 % IV SOLN: 1.0000 g | Freq: Once | INTRAVENOUS | Status: DC

## 2014-06-08 MED ORDER — CALCIUM CHLORIDE 10 % IV SOLN
1.0000 g | Freq: Once | INTRAVENOUS | Status: AC
  Administered 2014-06-08: 1 g via INTRAVENOUS
  Filled 2014-06-08: qty 10

## 2014-06-08 MED ORDER — PHENYLEPHRINE HCL 10 MG/ML IJ SOLN
INTRAMUSCULAR | Status: DC | PRN
  Administered 2014-06-08 (×2): 40 ug via INTRAVENOUS
  Administered 2014-06-08 (×2): 80 ug via INTRAVENOUS
  Administered 2014-06-08: 40 ug via INTRAVENOUS
  Administered 2014-06-08: 80 ug via INTRAVENOUS
  Administered 2014-06-08: 40 ug via INTRAVENOUS

## 2014-06-08 MED ORDER — COLCHICINE 0.6 MG PO TABS
0.6000 mg | ORAL_TABLET | Freq: Two times a day (BID) | ORAL | Status: DC
  Administered 2014-06-09 – 2014-06-20 (×7): 0.6 mg via ORAL
  Filled 2014-06-08 (×32): qty 1

## 2014-06-08 MED ORDER — VANCOMYCIN HCL IN DEXTROSE 1-5 GM/200ML-% IV SOLN
1000.0000 mg | Freq: Two times a day (BID) | INTRAVENOUS | Status: AC
  Administered 2014-06-09: 1000 mg via INTRAVENOUS
  Filled 2014-06-08: qty 200

## 2014-06-08 MED ORDER — ONDANSETRON HCL 4 MG/2ML IJ SOLN: 4.0000 mg | Freq: Four times a day (QID) | INTRAMUSCULAR | Status: DC | PRN

## 2014-06-08 MED ORDER — KCL IN DEXTROSE-NACL 20-5-0.45 MEQ/L-%-% IV SOLN
INTRAVENOUS | Status: DC
  Administered 2014-06-08: 100 mL/h via INTRAVENOUS
  Administered 2014-06-09: 05:00:00 via INTRAVENOUS
  Filled 2014-06-08 (×5): qty 1000

## 2014-06-08 MED ORDER — VANCOMYCIN HCL IN DEXTROSE 1-5 GM/200ML-% IV SOLN
1000.0000 mg | INTRAVENOUS | Status: AC
  Administered 2014-06-08: 1000 mg via INTRAVENOUS
  Filled 2014-06-08: qty 200

## 2014-06-08 MED ORDER — PHENYLEPHRINE HCL 10 MG/ML IJ SOLN
10.0000 mg | INTRAMUSCULAR | Status: DC | PRN
  Administered 2014-06-08: 10 ug/min via INTRAVENOUS

## 2014-06-08 MED ORDER — PHENYLEPHRINE HCL 10 MG/ML IJ SOLN
0.0000 ug/min | INTRAVENOUS | Status: DC
  Administered 2014-06-08: 10 ug/min via INTRAVENOUS
  Filled 2014-06-08: qty 1

## 2014-06-08 MED ORDER — CETYLPYRIDINIUM CHLORIDE 0.05 % MT LIQD
7.0000 mL | Freq: Four times a day (QID) | OROMUCOSAL | Status: DC
  Administered 2014-06-09 (×2): 7 mL via OROMUCOSAL

## 2014-06-08 MED ORDER — INSULIN ASPART 100 UNIT/ML ~~LOC~~ SOLN
0.0000 [IU] | Freq: Three times a day (TID) | SUBCUTANEOUS | Status: DC
  Administered 2014-06-11: 2 [IU] via SUBCUTANEOUS
  Administered 2014-06-11 – 2014-06-12 (×2): 4 [IU] via SUBCUTANEOUS
  Administered 2014-06-16 (×2): 3 [IU] via SUBCUTANEOUS
  Administered 2014-06-17: 4 [IU] via SUBCUTANEOUS
  Administered 2014-06-18: 7 [IU] via SUBCUTANEOUS

## 2014-06-08 MED ORDER — SODIUM CHLORIDE 0.9 % IV SOLN
25.0000 ug/h | INTRAVENOUS | Status: DC
  Administered 2014-06-08: 50 ug/h via INTRAVENOUS
  Filled 2014-06-08 (×2): qty 50

## 2014-06-08 MED ORDER — FENTANYL CITRATE 0.05 MG/ML IJ SOLN
INTRAMUSCULAR | Status: DC | PRN
  Administered 2014-06-08: 50 ug via INTRAVENOUS
  Administered 2014-06-08 (×2): 25 ug via INTRAVENOUS
  Administered 2014-06-08: 50 ug via INTRAVENOUS

## 2014-06-08 MED ORDER — NEOSTIGMINE METHYLSULFATE 10 MG/10ML IV SOLN
INTRAVENOUS | Status: DC | PRN
Start: 2014-06-08 — End: 2014-06-08
  Administered 2014-06-08: 4 mg via INTRAVENOUS

## 2014-06-08 MED ORDER — TRAMADOL HCL 50 MG PO TABS
50.0000 mg | ORAL_TABLET | Freq: Four times a day (QID) | ORAL | Status: DC | PRN
  Administered 2014-06-09 – 2014-06-17 (×5): 50 mg via ORAL
  Filled 2014-06-08 (×5): qty 1

## 2014-06-08 MED ORDER — PROPOFOL 10 MG/ML IV BOLUS
INTRAVENOUS | Status: DC | PRN
  Administered 2014-06-08: 60 mg via INTRAVENOUS
  Administered 2014-06-08: 100 mg via INTRAVENOUS
  Administered 2014-06-08 (×2): 20 mg via INTRAVENOUS

## 2014-06-08 MED ORDER — POTASSIUM CHLORIDE 10 MEQ/50ML IV SOLN
10.0000 meq | Freq: Every day | INTRAVENOUS | Status: DC | PRN
  Filled 2014-06-08: qty 50

## 2014-06-08 MED ORDER — 0.9 % SODIUM CHLORIDE (POUR BTL) OPTIME
TOPICAL | Status: DC | PRN
  Administered 2014-06-08: 2000 mL

## 2014-06-08 MED ORDER — ROCURONIUM BROMIDE 100 MG/10ML IV SOLN
INTRAVENOUS | Status: DC | PRN
  Administered 2014-06-08 (×2): 30 mg via INTRAVENOUS
  Administered 2014-06-08 (×2): 10 mg via INTRAVENOUS
  Administered 2014-06-08: 20 mg via INTRAVENOUS

## 2014-06-08 MED ORDER — ALPRAZOLAM 0.5 MG PO TABS
0.5000 mg | ORAL_TABLET | Freq: Three times a day (TID) | ORAL | Status: DC | PRN
  Administered 2014-06-10 – 2014-06-23 (×2): 0.5 mg via ORAL
  Filled 2014-06-08 (×2): qty 1

## 2014-06-08 MED ORDER — FENTANYL CITRATE 0.05 MG/ML IJ SOLN: 50.0000 ug | Freq: Once | INTRAMUSCULAR | Status: DC

## 2014-06-08 SURGICAL SUPPLY — 90 items
APPLIER CLIP ROT 10 11.4 M/L (STAPLE)
BENZOIN TINCTURE PRP APPL 2/3 (GAUZE/BANDAGES/DRESSINGS) IMPLANT
BLADE SURG 11 STRL SS (BLADE) ×4 IMPLANT
CANISTER SUCTION 2500CC (MISCELLANEOUS) ×4 IMPLANT
CATH KIT ON Q 5IN SLV (PAIN MANAGEMENT) IMPLANT
CATH THORACIC 28FR (CATHETERS) ×4 IMPLANT
CATH THORACIC 28FR RT ANG (CATHETERS) IMPLANT
CATH THORACIC 36FR (CATHETERS) IMPLANT
CATH THORACIC 36FR RT ANG (CATHETERS) IMPLANT
CLIP APPLIE ROT 10 11.4 M/L (STAPLE) IMPLANT
CLIP TI MEDIUM 6 (CLIP) ×4 IMPLANT
CLOSURE WOUND 1/2 X4 (GAUZE/BANDAGES/DRESSINGS) ×1
CONN ST 1/4X3/8  BEN (MISCELLANEOUS) ×2
CONN ST 1/4X3/8 BEN (MISCELLANEOUS) ×2 IMPLANT
CONN Y 3/8X3/8X3/8  BEN (MISCELLANEOUS)
CONN Y 3/8X3/8X3/8 BEN (MISCELLANEOUS) IMPLANT
CONT SPEC 4OZ CLIKSEAL STRL BL (MISCELLANEOUS) ×16 IMPLANT
COVER SURGICAL LIGHT HANDLE (MISCELLANEOUS) ×8 IMPLANT
DERMABOND ADVANCED (GAUZE/BANDAGES/DRESSINGS)
DERMABOND ADVANCED .7 DNX12 (GAUZE/BANDAGES/DRESSINGS) IMPLANT
DRAIN CHANNEL 28F RND 3/8 FF (WOUND CARE) IMPLANT
DRAIN CHANNEL 32F RND 10.7 FF (WOUND CARE) IMPLANT
DRAPE LAPAROSCOPIC ABDOMINAL (DRAPES) ×4 IMPLANT
DRAPE PROXIMA HALF (DRAPES) ×4 IMPLANT
DRAPE WARM FLUID 44X44 (DRAPE) ×4 IMPLANT
ELECT BLADE 6.5 EXT (BLADE) ×4 IMPLANT
ELECT REM PT RETURN 9FT ADLT (ELECTROSURGICAL) ×4
ELECTRODE REM PT RTRN 9FT ADLT (ELECTROSURGICAL) ×2 IMPLANT
EVACUATOR SILICONE 100CC (DRAIN) ×4 IMPLANT
GAUZE SPONGE 4X4 12PLY STRL (GAUZE/BANDAGES/DRESSINGS) ×4 IMPLANT
GAUZE SPONGE 4X4 16PLY XRAY LF (GAUZE/BANDAGES/DRESSINGS) ×4 IMPLANT
GLOVE BIOGEL PI IND STRL 6 (GLOVE) ×4 IMPLANT
GLOVE BIOGEL PI INDICATOR 6 (GLOVE) ×4
GLOVE SURG SIGNA 7.5 PF LTX (GLOVE) ×8 IMPLANT
GOWN STRL REUS W/ TWL LRG LVL3 (GOWN DISPOSABLE) ×4 IMPLANT
GOWN STRL REUS W/ TWL XL LVL3 (GOWN DISPOSABLE) ×2 IMPLANT
GOWN STRL REUS W/TWL LRG LVL3 (GOWN DISPOSABLE) ×4
GOWN STRL REUS W/TWL XL LVL3 (GOWN DISPOSABLE) ×2
HEMOSTAT SURGICEL 2X14 (HEMOSTASIS) IMPLANT
KIT BASIN OR (CUSTOM PROCEDURE TRAY) ×4 IMPLANT
KIT ROOM TURNOVER OR (KITS) ×4 IMPLANT
KIT SUCTION CATH 14FR (SUCTIONS) ×4 IMPLANT
LIQUID BAND (GAUZE/BANDAGES/DRESSINGS) ×4 IMPLANT
NS IRRIG 1000ML POUR BTL (IV SOLUTION) ×16 IMPLANT
PACK CHEST (CUSTOM PROCEDURE TRAY) ×4 IMPLANT
PAD ARMBOARD 7.5X6 YLW CONV (MISCELLANEOUS) ×16 IMPLANT
PAD ELECT DEFIB RADIOL ZOLL (MISCELLANEOUS) ×4 IMPLANT
POUCH ENDO CATCH II 15MM (MISCELLANEOUS) IMPLANT
POUCH SPECIMEN RETRIEVAL 10MM (ENDOMECHANICALS) IMPLANT
SCISSORS ENDO CVD 5DCS (MISCELLANEOUS) IMPLANT
SEALANT PROGEL (MISCELLANEOUS) IMPLANT
SEALANT SURG COSEAL 4ML (VASCULAR PRODUCTS) IMPLANT
SEALANT SURG COSEAL 8ML (VASCULAR PRODUCTS) IMPLANT
SOLUTION ANTI FOG 6CC (MISCELLANEOUS) ×4 IMPLANT
SPONGE INTESTINAL PEANUT (DISPOSABLE) IMPLANT
STAPLER VISISTAT 35W (STAPLE) IMPLANT
STRIP CLOSURE SKIN 1/2X4 (GAUZE/BANDAGES/DRESSINGS) ×3 IMPLANT
SUT PROLENE 4 0 RB 1 (SUTURE)
SUT PROLENE 4 0 SH DA (SUTURE) ×4 IMPLANT
SUT PROLENE 4-0 RB1 .5 CRCL 36 (SUTURE) IMPLANT
SUT SILK  1 MH (SUTURE) ×8
SUT SILK 1 MH (SUTURE) ×8 IMPLANT
SUT SILK 1 TIES 10X30 (SUTURE) ×4 IMPLANT
SUT SILK 2 0 SH (SUTURE) IMPLANT
SUT SILK 2 0SH CR/8 30 (SUTURE) ×4 IMPLANT
SUT SILK 3 0 SH 30 (SUTURE) IMPLANT
SUT SILK 3 0SH CR/8 30 (SUTURE) IMPLANT
SUT VIC AB 1 CTX 36 (SUTURE) ×4
SUT VIC AB 1 CTX36XBRD ANBCTR (SUTURE) ×4 IMPLANT
SUT VIC AB 2-0 CTX 36 (SUTURE) ×12 IMPLANT
SUT VIC AB 2-0 UR6 27 (SUTURE) ×4 IMPLANT
SUT VIC AB 3-0 MH 27 (SUTURE) IMPLANT
SUT VIC AB 3-0 X1 27 (SUTURE) ×16 IMPLANT
SUT VICRYL 2 TP 1 (SUTURE) IMPLANT
SWAB COLLECTION DEVICE MRSA (MISCELLANEOUS) IMPLANT
SYR 30ML SLIP (SYRINGE) IMPLANT
SYRINGE 10CC LL (SYRINGE) IMPLANT
SYSTEM SAHARA CHEST DRAIN ATS (WOUND CARE) ×4 IMPLANT
TAPE CLOTH 4X10 WHT NS (GAUZE/BANDAGES/DRESSINGS) ×4 IMPLANT
TAPE CLOTH SURG 4X10 WHT LF (GAUZE/BANDAGES/DRESSINGS) ×4 IMPLANT
TIP APPLICATOR SPRAY EXTEND 16 (VASCULAR PRODUCTS) IMPLANT
TOWEL OR 17X24 6PK STRL BLUE (TOWEL DISPOSABLE) ×4 IMPLANT
TOWEL OR 17X26 10 PK STRL BLUE (TOWEL DISPOSABLE) ×8 IMPLANT
TRAP SPECIMEN MUCOUS 40CC (MISCELLANEOUS) ×12 IMPLANT
TRAY FOLEY CATH 14FRSI W/METER (CATHETERS) ×4 IMPLANT
TRAY FOLEY CATH 16FRSI W/METER (SET/KITS/TRAYS/PACK) ×4 IMPLANT
TROCAR XCEL BLADELESS 5X75MML (TROCAR) ×4 IMPLANT
TUBE ANAEROBIC SPECIMEN COL (MISCELLANEOUS) IMPLANT
TUNNELER SHEATH ON-Q 16GX12 DP (PAIN MANAGEMENT) IMPLANT
WATER STERILE IRR 1000ML POUR (IV SOLUTION) ×8 IMPLANT

## 2014-06-08 NOTE — Progress Notes (Signed)
PULMONARY / CRITICAL CARE MEDICINE   Name: LUZELENA HEEG MRN: [id number] DOB: 04/02/1946    ADMISSION DATE:  06/07/2014 CONSULTATION DATE:  06/07/2014  REFERRING MD :  Dr. [name]  CHIEF COMPLAINT:  Lower extremity edema  INITIAL PRESENTATION: 68 year old female with recent admissions for pericardial effusions and recent pericardial window, presented to [facility] office 12/22 for f/u OV. She was noted to have severe lower extremity edema. Echo was ordered and showed a recurrence of effusion and early tamponade physiology. She was admitted to their service. PCCM to consult.    STUDIES:  Spirometry Nov 2012: Fev1 0.85L/45% 2D Echo 12/22 >  Mod LVH, LVEF 55-60%, Grade 1 DD. PA peak pressure 75 mmHg. Large pericardial effusion, c/w early tamponade.   SIGNIFICANT EVENTS:   Neuro: difficulty with speech, weakness, numbness, ataxia    SUBJECTIVE: some shortness of breath, anxiety, has not decided about undergoing surgery  VITAL SIGNS: Temp:  [97.6 F (36.4 C)-98.1 F (36.7 C)] 97.7 F (36.5 C) (12/23 0713) Pulse Rate:  [93-109] 103 (12/23 0800) Resp:  [16-31] 31 (12/23 0800) BP: (139-182)/(86-100) 165/100 mmHg (12/23 0800) SpO2:  [91 %-100 %] 100 % (12/23 0803) Weight:  [79.4 kg (175 lb 0.7 oz)-83.462 kg (184 lb)] 79.4 kg (175 lb 0.7 oz) (12/23 0500) HEMODYNAMICS:   VENTILATOR SETTINGS:   INTAKE / OUTPUT:  Intake/Output Summary (Last 24 hours) at 06/08/14 0917 Last data filed at 06/08/14 0800  Gross per 24 hour  Intake 818.33 ml  Output   1250 ml  Net -431.67 ml    PHYSICAL EXAMINATION: General:  Mild tachypnea, speaking in full sentences HEENT: NCAT, EOMi PULM: diminished, limited air movement, no wheezing CV: Tachy, split S2? AB: BS+, soft, nontender Ext: warm, no edema Neuro A&Ox4, anxious   LABS:  CBC  Recent Labs Lab 06/07/14 2000  WBC 5.1  HGB 9.2*  HCT 30.8*  PLT 164   Coag's  Recent Labs Lab 06/07/14 2000  INR 0.93   BMET  Recent  Labs Lab 06/07/14 2000  NA 142  K 4.6  CL 94*  CO2 40*  BUN 10  CREATININE 0.97  GLUCOSE 114*   Electrolytes  Recent Labs Lab 06/07/14 2000  CALCIUM 9.6   Sepsis Markers No results for input(s): LATICACIDVEN, PROCALCITON, O2SATVEN in the last 168 hours. ABG No results for input(s): PHART, PCO2ART, PO2ART in the last 168 hours. Liver Enzymes No results for input(s): AST, ALT, ALKPHOS, BILITOT, ALBUMIN in the last 168 hours. Cardiac Enzymes No results for input(s): TROPONINI, PROBNP in the last 168 hours. Glucose  Recent Labs Lab 06/07/14 1856 06/07/14 2138 06/08/14 0711  GLUCAP 122* 168* 113*    Imaging Dg Chest 2 View  06/07/2014   CLINICAL DATA:  Shortness of breath and weakness today; history of pericardial effusion with creation of pericardial window on May 11, 2014  EXAM: CHEST  2 VIEW  COMPARISON:  Portable chest x-ray of May 24, 2014.  FINDINGS: The cardiopericardial silhouette remains mildly enlarged. There remains prominence of the central pulmonary vascularity. There are small pleural effusions layering inferiorly. The pulmonary vascularity exhibits no significant cephalization. The trachea is midline. The bony thorax is unremarkable.  IMPRESSION: There is mild enlargement of the cardiac silhouette with central pulmonary vascular prominence without cephalization consistent with compensated low-grade CHF. There are small bilateral pleural effusions not greatly changed from the previous study. There is no definite pneumonia.   Electronically Signed   By: David  Martinique   On: 06/07/2014 12:22  ASSESSMENT / PLAN:  PULMONARY A: Chronic respiratory failure 2nd to severe COPD without acute exacerbation (on 2L at home) RLL infiltrate vs ATX Mild pulmonary edema P:   Continue supplemental O2 at 2L (home) Continue preadmission brovana, budesonide PRN Duoneb Cough supression DNR/DNI for now> may reverse if she decides on surgery  CARDIOVASCULAR A:   Pericardial effusion, concern for early tamponade HTN  P:  Managment per cardiology/TCTS She is considering going back for pericardial window  RENAL A:   No known issues  P:   Bmet pending Strict I&O  GASTROINTESTINAL A:   GERD  P:   SUP: PO protonix NPO per TCTS  HEMATOLOGIC A:   No acute issues  P:  CBC, coags pending Hold VTE chemoprophylaxis in setting of effusion SCDs  INFECTIOUS A:   No acute issues  P:   Monitor off ABX   ENDOCRINE A:  DM  P:   CBG monitoring SSI  NEUROLOGIC A:   No acute issues  P:   RASS goal: 0 Monitor   FAMILY  - Updates: No family at bedside. Daughter updated by TCTS 12/22  - Inter-disciplinary family meet or Palliative Care meeting due by:  12/29  Roselie Awkward, MD [place] [facility] PCCM Pager: [phone] Cell: [phone] If no response, call [phone]

## 2014-06-08 NOTE — Progress Notes (Signed)
[facility] PCCM  S: Tolerated pericardial window today O: Filed Vitals:   06/08/14 1000 06/08/14 1100 06/08/14 1155 06/08/14 1200  BP: 167/103 172/88  167/90  Pulse: 102 104  108  Temp:   98 F (36.7 C)   TempSrc:   Oral   Resp: 23 23  26   Weight:      SpO2: 98% 100%  98%   Gen: sedated on vent HEENT: NCAT, EOMi, ETT PULM: Diminished bilaterally, chest tubes on left CV: RRR, no mgr AB: BS+, soft,  Ext: warm, trace edema Neuro: drowsy, tracks with eyes  Impression: 1) INtubated post pericardial window 2) Severe COPD, not in exacerbation 3) Pericarditis, uncertain etiology  Plan: 1) full vent support overnight 2) fentanyl gtt titrated to RASS -1 3) chest tube per TCTS 4) colchicine for now 5) hopeful extubation in AM  CC time 30 minutes  Roselie Awkward, MD [facility] PCCM Pager: [phone] Cell: [phone] If no response, call [phone]

## 2014-06-08 NOTE — Plan of Care (Signed)
Problem: Phase I Progression Outcomes Goal: Pain controlled with appropriate interventions Outcome: Completed/Met Date Met:  06/08/14 Denies pain.

## 2014-06-08 NOTE — Plan of Care (Signed)
Problem: Phase I Progression Outcomes Goal: OOB as tolerated unless otherwise ordered Outcome: Completed/Met Date Met:  06/08/14 Up to bedside commode.

## 2014-06-08 NOTE — Anesthesia Procedure Notes (Addendum)
Procedure Name: Intubation Date/Time: 06/08/2014 2:30 PM Performed by: Julian Reil Pre-anesthesia Checklist: Patient identified, Emergency Drugs available, Suction available and Patient being monitored Patient Re-evaluated:Patient Re-evaluated prior to inductionOxygen Delivery Method: Circle system utilized Preoxygenation: Pre-oxygenation with 100% oxygen Intubation Type: IV induction Laryngoscope Size: Mac and 3 Grade View: Grade II Tube type: Oral Endobronchial tube: Left, Double lumen EBT, EBT position confirmed by fiberoptic bronchoscope and EBT position confirmed by auscultation and 35 Fr Number of attempts: 1 Airway Equipment and Method: Stylet Placement Confirmation: ETT inserted through vocal cords under direct vision,  positive ETCO2 and breath sounds checked- equal and bilateral Tube secured with: Tape Dental Injury: Teeth and Oropharynx as per pre-operative assessment

## 2014-06-08 NOTE — Progress Notes (Signed)
ABG results called to Dr. Halford Chessman.  No orders at this time.

## 2014-06-08 NOTE — Interval H&P Note (Signed)
History and Physical Interval Note: After discussions with Dr. Irish Lack and McQuaid this morning [name] has had a change of heart and is wishes to proceed with a left VATS, pericardial window to drain her pericardial effusion. She is aware of the risks and benefits and understands no guarantee can be given against recurrent effusion.  06/08/2014 1:43 PM  [name]  has presented today for surgery, with the diagnosis of Left  The various methods of treatment have been discussed with the patient and family. After consideration of risks, benefits and other options for treatment, the patient has consented to  Procedure(s): PERICARDIAL WINDOW (N/A) VIDEO ASSISTED THORACOSCOPY (VATS) (Left) as a surgical intervention .  The patient's history has been reviewed, patient examined, no change in status, stable for surgery.  I have reviewed the patient's chart and labs.  Questions were answered to the patient's satisfaction.     Kween Bacorn C

## 2014-06-08 NOTE — Progress Notes (Signed)
UR Completed.  [phone]  

## 2014-06-08 NOTE — Progress Notes (Signed)
CT surgery p.m. Rounds  Patient sedated on ventilator after left VATS and pericardial window Blood pressure slightly down and urine output down-Will give albumin and check CVP Minimal drainage from chest tubes Last hematocrit 30% Not ready for vent wean because of COPD and low PaO2-58

## 2014-06-08 NOTE — Anesthesia Preprocedure Evaluation (Addendum)
Anesthesia Evaluation  Patient identified by MRN, date of birth, ID band Patient awake  General Assessment Comment:PT intubated, sedated but arousable.   Reviewed: Allergy & Precautions, H&P , NPO status , Patient's Chart, lab work & pertinent test results  History of Anesthesia Complications Negative for: history of anesthetic complications  Airway Mallampati: II  TM Distance: >3 FB Neck ROM: Full    Dental  (+)    Pulmonary shortness of breath, at rest and lying, pneumonia -, COPD COPD inhaler and oxygen dependent, former smoker,    + decreased breath sounds      Cardiovascular hypertension, Pt. on medications +CHF Rhythm:Regular Rate:Tachycardia     Neuro/Psych PSYCHIATRIC DISORDERS Anxiety Depression  Neuromuscular disease    GI/Hepatic Neg liver ROS, GERD-  ,  Endo/Other  diabetes, Type 2, Oral Hypoglycemic AgentsMorbid obesity  Renal/GU negative Renal ROS     Musculoskeletal   Abdominal   Peds  Hematology  (+) anemia , HIV,   Anesthesia Other Findings   Reproductive/Obstetrics                          Anesthesia Physical Anesthesia Plan  ASA: IV and emergent  Anesthesia Plan: General   Post-op Pain Management:    Induction: Intravenous, Rapid sequence and Cricoid pressure planned  Airway Management Planned: Oral ETT  Additional Equipment: CVP, Arterial line and TEE  Intra-op Plan:   Post-operative Plan: Post-operative intubation/ventilation  Informed Consent: I have reviewed the patients History and Physical, chart, labs and discussed the procedure including the risks, benefits and alternatives for the proposed anesthesia with the patient or authorized representative who has indicated his/her understanding and acceptance.   Dental advisory given  Plan Discussed with: CRNA and Surgeon  Anesthesia Plan Comments:         Anesthesia Quick Evaluation

## 2014-06-08 NOTE — Transfer of Care (Signed)
Immediate Anesthesia Transfer of Care Note  Patient: [name]  Procedure(s) Performed: Procedure(s): PERICARDIAL WINDOW (N/A) VIDEO ASSISTED THORACOSCOPY (VATS) (Left)  Patient Location: SICU  Anesthesia Type:General  Level of Consciousness: awake, alert  and oriented  Airway & Oxygen Therapy: Patient remains intubated per anesthesia plan and Patient placed on Ventilator (see vital sign flow sheet for setting)  Post-op Assessment: Report given to PACU RN and Post -op Vital signs reviewed and stable  Post vital signs: Reviewed and stable  Complications: No apparent anesthesia complications

## 2014-06-08 NOTE — Consult Note (Signed)
CARDIOLOGY CONSULT NOTE      Patient ID: [name] MRN: [id number] DOB/AGE: 1946/04/07 68 y.o.  Admit date: 06/07/2014 Referring PhysicianSteven Chaya Jan, MD Primary 71, [name], DO Primary Cardiologist N[place] Reason for Consultation: pericardial effusion  HPI: 68 year old woman who has had a pericardial effusion. She had a pericardial window performed about a month ago. Looking back through the records, she has had a moderately sized pericardial effusion dating back to 2013.    She came back for f/u.  She is having lower extremity swelling. Repeat echocardiogram was performed showing a significant pericardial effusion. The vast majority of the fluid was posterior to the heart. We are asked to evaluate for the possibility of pericardiocentesis. Dr. [name] saw the patient and did recommend pericardial window due to the location of the fluid. The patient has severe lung disease and would like to avoid intubation for an operation.  Review of systems complete and found to be negative unless listed above   Past Medical History  Diagnosis Date  . Asthma   . COPD (chronic obstructive pulmonary disease)   . Hypertension   . Diabetes mellitus 09/13/2010    glucophage  . Gout   . Oxygen dependent     pt uses O2   . Pericardial effusion 08/2011    small residual on echo  . Hyperlipidemia   . Type II or unspecified type diabetes mellitus with neurological manifestations, uncontrolled   . Personal history of noncompliance with medical treatment, presenting hazards to health   . Unspecified disease of pericardium   . Hemorrhage of rectum and anus   . Panic disorder without agoraphobia   . Depressive disorder, not elsewhere classified   . Edema   . Anxiety state, unspecified   . Unspecified hereditary and idiopathic peripheral neuropathy   . Type II or unspecified type diabetes mellitus without mention of complication, not stated as uncontrolled   . Other and  unspecified hyperlipidemia   . Obesity, unspecified   . Unspecified tinnitus   . Unspecified essential hypertension   . Pneumonia, organism unspecified   . Chronic obstructive asthma, unspecified   . Acute and chronic respiratory failure   . Urinary frequency   . Urgency of urination   . Anemia due to chronic blood loss 01/04/2013    Family History  Problem Relation Age of Onset  . COPD Mother   . COPD Sister   . Hypertension Sister   . Cancer Father   . Asthma Father   . Diabetes Other     3 maternal aunts  . Prostate cancer Paternal Uncle     History   Social History  . Marital Status: Single    Spouse Name: N/A    Number of Children: 2  . Years of Education: N/A   Occupational History  . retired    Social History Main Topics  . Smoking status: Former Smoker -- 1.00 packs/day for 30 years    Types: Cigarettes    Quit date: 06/17/2004  . Smokeless tobacco: Never Used  . Alcohol Use: 1.0 oz/week    2 drink(s) per week     Comment: none iin a month  . Drug Use: No     Comment: past marijuana use in 1980's.  [name] Sexual Activity: No   Other Topics Concern  . Not on file   Social History Narrative   Lives alone. Kids in [place] but not much contact. One first cousin and 33 second cousins in [place] but  no contact. Moved from [place] to [place] in Dec 2011    Past Surgical History  Procedure Laterality Date  . Breast biopsy      both breasts  . Total abdominal hysterectomy    . Esophagogastroduodenoscopy N/A 01/03/2013    Procedure: ESOPHAGOGASTRODUODENOSCOPY (EGD);  Surgeon: Inda Castle, MD;  Location: [place];  Service: Endoscopy;  Laterality: N/A;  . Colonoscopy N/A 01/05/2013    Procedure: COLONOSCOPY;  Surgeon: Gatha Mayer, MD;  Location: [place] [facility];  Service: Endoscopy;  Laterality: N/A;  . Subxyphoid pericardial window N/A 05/11/2014    Procedure: SUBXYPHOID PERICARDIAL WINDOW;  Surgeon: Melrose Nakayama, MD;  Location: [place];   Service: Thoracic;  Laterality: N/A;  . Tee without cardioversion N/A 05/11/2014    Procedure: TRANSESOPHAGEAL ECHOCARDIOGRAM (TEE);  Surgeon: Melrose Nakayama, MD;  [facility];  Service: Thoracic;  Laterality: N/A;     Prescriptions prior to admission  Medication Sig Dispense Refill Last Dose  . albuterol (PROVENTIL) (2.5 MG/3ML) 0.083% nebulizer solution Take 3 mLs (2.5 mg total) by nebulization every 3 (three) hours as needed for wheezing or shortness of breath. 75 mL 12 06/07/2014 at Unknown time  . ALPRAZolam (XANAX) 0.25 MG tablet Take 1 tablet (0.25 mg total) by mouth 2 (two) times daily as needed for anxiety. 30 tablet 0 06/07/2014 at Unknown time  . arformoterol (BROVANA) 15 MCG/2ML NEBU Take 2 mLs (15 mcg total) by nebulization 2 (two) times daily. 120 mL  06/07/2014 at Unknown time  . budesonide (PULMICORT) 0.25 MG/2ML nebulizer solution Take 2 mLs (0.25 mg total) by nebulization 2 (two) times daily. 120 mL 12 06/07/2014 at Unknown time  . dextromethorphan-guaiFENesin (MUCINEX DM) 30-600 MG per 12 hr tablet Take 1 tablet by mouth 2 (two) times daily.   06/07/2014 at Unknown time  . feeding supplement, GLUCERNA SHAKE, (GLUCERNA SHAKE) LIQD Take 237 mLs by mouth 3 (three) times daily between meals.  0 06/06/2014 at Unknown time  . hydrALAZINE (APRESOLINE) 25 MG tablet Take 1 tablet (25 mg total) by mouth every 8 (eight) hours. 90 tablet 3 06/07/2014 at Unknown time  . insulin aspart (NOVOLOG) 100 UNIT/ML injection SQ qhs   CBG 70 - 120: 0 units CBG 121 - 150: 0 units CBG 151 - 200: 0 units CBG 201 - 250: 2 units CBG 251 - 300: 3 units CBG 301 - 350: 4 units CBG 351 - 400: 5 units CBG > 400: call MD 10 mL 11 Taking  . insulin aspart (NOVOLOG) 100 UNIT/ML injection SQ TID with meals   CBG 70 - 120: 0 units CBG 121 - 150: 2 units CBG 151 - 200: 3 units CBG 201 - 250: 5 units CBG 251 - 300: 8 units CBG 301 - 350: 11 units CBG 351 - 400: 15 units CBG > 400: call MD 10  mL 11 Taking  . ipratropium-albuterol (DUONEB) 0.5-2.5 (3) MG/3ML SOLN Take 3 mLs by nebulization 4 (four) times daily. 360 mL  Taking  . lovastatin (MEVACOR) 40 MG tablet Take 1 tablet (40 mg total) by mouth at bedtime. For hyperlipedemia 30 tablet 3 Taking  . Multiple Vitamins-Minerals (MULTIVITAMIN ADULTS 50+) TABS Take 1 tablet by mouth daily.   Taking  . pantoprazole (PROTONIX) 40 MG tablet Take 1 tablet (40 mg total) by mouth daily. (Patient not taking: Reported on 06/07/2014) 30 tablet 3 Completed Course  . predniSONE (DELTASONE) 10 MG tablet 2 tabs po daily x 5 days then 1 tab po  daily x 5 days then stop   Taking  . promethazine-codeine (PHENERGAN WITH CODEINE) 6.25-10 MG/5ML syrup Take 5 mLs by mouth every 6 (six) hours as needed for cough. 240 mL 0 Taking  . sertraline (ZOLOFT) 50 MG tablet Take 1 tablet (50 mg total) by mouth at bedtime. 30 tablet 3 Taking    Physical Exam: Vitals:   Filed Vitals:   06/08/14 0713 06/08/14 0800 06/08/14 0803 06/08/14 0900  BP:  165/100  171/89  Pulse:  103  103  Temp: 97.7 F (36.5 C)     TempSrc: Oral     Resp:  31  24  Weight:      SpO2:  100% 100% 97%   I&O's:   Intake/Output Summary (Last 24 hours) at 06/08/14 0947 Last data filed at 06/08/14 0900  Gross per 24 hour  Intake 868.33 ml  Output   1250 ml  Net -381.67 ml   Physical exam: In mild distress Rhea/AT EOMI No JVD, No carotid bruit RRR S1S2 normal intensity heart sounds Bilateral wheezing Soft. NT, nondistended No edema. No change in radial pulse with deep breathing No focal motor or sensory deficits Normal affect Bilateral lower extremity pitting edema  Labs:   Lab Results  Component Value Date   WBC 5.1 06/07/2014   HGB 9.2* 06/07/2014   HCT 30.8* 06/07/2014   MCV 93.9 06/07/2014   PLT 164 06/07/2014    Recent Labs Lab 06/07/14 2000  NA 142  K 4.6  CL 94*  CO2 40*  BUN 10  CREATININE 0.97  CALCIUM 9.6  GLUCOSE 114*   Lab Results  Component Value  Date   CKTOTAL 258* 03/16/2011   CKMB 7.3* 03/16/2011   TROPONINI <0.30 05/23/2014   No results found for: CHOL Lab Results  Component Value Date   HDL 71 01/21/2014   HDL 69 10/22/2013   HDL 79 07/15/2013   Lab Results  Component Value Date   LDLCALC 155* 01/21/2014   LDLCALC 162* 10/22/2013   LDLCALC 127* 07/15/2013   Lab Results  Component Value Date   TRIG 77 05/20/2014   TRIG 159* 01/21/2014   TRIG 93 10/22/2013   Lab Results  Component Value Date   CHOLHDL 3.6 01/21/2014   CHOLHDL 3.6 10/22/2013   CHOLHDL 2.8 07/15/2013   No results found for: LDLDIRECT    Radiology:  Echocardiogram reveals a large, predominantly posterior pericardial effusion; some features concerning for early tamponade  physiology EKG: Pending  ASSESSMENT AND PLAN:  Active Problems:   Pericardial effusion with cardiac tamponade   Acute pericardial effusion  I personally reviewed the echocardiogram images. I discussed the findings with the patient as well. Her hesitation for pericardial window is not wanting to go back on the ventilator, due to severe lung disease.   Currently, she is hypertensive and only minimally tachycardic. She is wheezing bilaterally. I think much of her symptoms are coming from her lung disease. Based on her exam, she does not appear to be in [place].  Given that there is minimal fluid anterior to the right ventricle by echo, I think the risks of pericardiocentesis are certainly increased. I would recommend that surgical pericardial window would be a safer option for the patient. She understands the risks of pericardiocentesis to include perforation of the right ventricle and possibly pneumothorax. She will consider her options and think further about pericardial window. I discussed this with Dr. [name]. I'll touch base with the patient later today to see what she  has decided.  Signed:   Mina Marble, MD, [facility] 06/08/2014, 9:47 AM

## 2014-06-08 NOTE — Brief Op Note (Addendum)
06/07/2014 - 06/08/2014  3:45 PM  PATIENT:  [name]  68 y.o. female  PRE-OPERATIVE DIAGNOSIS: Recurrent Pericardial Effusion   POST-OPERATIVE DIAGNOSIS:  Recurrent Pericardial Effusion  PROCEDURE:  LEFT VIDEO ASSISTED THORACOSCOPY   PERICARDIAL WINDOW  SURGEON:  Melrose Nakayama, MD  ASSISTANT: Suzzanne Cloud, PA-C  ANESTHESIA:   general   EBL: Minimal  DRAINS: 19 and 28 Blake drains   PATIENT CONDITION:  PACU - hemodynamically stable.  ~ 400 ml of serous fluid in pericardium. Pericardium looked normal as did epicardium. No significant inflammation seen.

## 2014-06-09 ENCOUNTER — Inpatient Hospital Stay (HOSPITAL_COMMUNITY): Payer: Medicare Other

## 2014-06-09 ENCOUNTER — Encounter (HOSPITAL_COMMUNITY): Payer: Self-pay | Admitting: Thoracic Surgery (Cardiothoracic Vascular Surgery)

## 2014-06-09 DIAGNOSIS — J9620 Acute and chronic respiratory failure, unspecified whether with hypoxia or hypercapnia: Secondary | ICD-10-CM

## 2014-06-09 LAB — BASIC METABOLIC PANEL
ANION GAP: 4 — AB (ref 5–15)
BUN: 13 mg/dL (ref 6–23)
CHLORIDE: 95 meq/L — AB (ref 96–112)
CO2: 36 mmol/L — AB (ref 19–32)
CREATININE: 1.72 mg/dL — AB (ref 0.50–1.10)
Calcium: 8.9 mg/dL (ref 8.4–10.5)
GFR calc Af Amer: 34 mL/min — ABNORMAL LOW (ref >90)
GFR calc non Af Amer: 29 mL/min — ABNORMAL LOW (ref >90)
Glucose, Bld: 168 mg/dL — ABNORMAL HIGH (ref 70–99)
Potassium: 4.2 mmol/L (ref 3.5–5.1)
Sodium: 135 mmol/L (ref 135–145)

## 2014-06-09 LAB — PREPARE RBC (CROSSMATCH)

## 2014-06-09 LAB — POCT I-STAT 3, ART BLOOD GAS (G3+)
ACID-BASE EXCESS: 9 mmol/L — AB (ref 0.0–2.0)
BICARBONATE: 34.8 meq/L — AB (ref 20.0–24.0)
O2 Saturation: 90 %
TCO2: 36 mmol/L (ref 0–100)
pCO2 arterial: 53.5 mmHg — ABNORMAL HIGH (ref 35.0–45.0)
pH, Arterial: 7.422 (ref 7.350–7.450)
pO2, Arterial: 60 mmHg — ABNORMAL LOW (ref 80.0–100.0)

## 2014-06-09 LAB — GLUCOSE, CAPILLARY
GLUCOSE-CAPILLARY: 158 mg/dL — AB (ref 70–99)
GLUCOSE-CAPILLARY: 103 mg/dL — AB (ref 70–99)
GLUCOSE-CAPILLARY: 125 mg/dL — AB (ref 70–99)
Glucose-Capillary: 104 mg/dL — ABNORMAL HIGH (ref 70–99)
Glucose-Capillary: 114 mg/dL — ABNORMAL HIGH (ref 70–99)
Glucose-Capillary: 118 mg/dL — ABNORMAL HIGH (ref 70–99)
Glucose-Capillary: 141 mg/dL — ABNORMAL HIGH (ref 70–99)

## 2014-06-09 LAB — CBC
HEMATOCRIT: 24.8 % — AB (ref 36.0–46.0)
Hemoglobin: 7.6 g/dL — ABNORMAL LOW (ref 12.0–15.0)
MCH: 27.7 pg (ref 26.0–34.0)
MCHC: 30.6 g/dL (ref 30.0–36.0)
MCV: 90.5 fL (ref 78.0–100.0)
PLATELETS: 148 10*3/uL — AB (ref 150–400)
RBC: 2.74 MIL/uL — ABNORMAL LOW (ref 3.87–5.11)
RDW: 18.2 % — ABNORMAL HIGH (ref 11.5–15.5)
WBC: 8.4 10*3/uL (ref 4.0–10.5)

## 2014-06-09 MED ORDER — CETYLPYRIDINIUM CHLORIDE 0.05 % MT LIQD
7.0000 mL | Freq: Two times a day (BID) | OROMUCOSAL | Status: DC
  Administered 2014-06-09 – 2014-06-24 (×26): 7 mL via OROMUCOSAL

## 2014-06-09 MED ORDER — SODIUM CHLORIDE 0.9 % IJ SOLN
10.0000 mL | Freq: Two times a day (BID) | INTRAMUSCULAR | Status: DC
  Administered 2014-06-09: 10 mL
  Administered 2014-06-09: 20 mL
  Administered 2014-06-10 – 2014-06-11 (×3): 10 mL
  Administered 2014-06-11: 20 mL
  Administered 2014-06-12 – 2014-06-13 (×3): 10 mL
  Administered 2014-06-14: 20 mL
  Administered 2014-06-15 – 2014-06-16 (×3): 10 mL
  Administered 2014-06-16: 20 mL
  Administered 2014-06-17 – 2014-06-18 (×3): 10 mL
  Administered 2014-06-18 – 2014-06-19 (×3): 20 mL
  Administered 2014-06-20 – 2014-06-21 (×2): 10 mL

## 2014-06-09 MED ORDER — ALBUMIN HUMAN 5 % IV SOLN
12.5000 g | Freq: Once | INTRAVENOUS | Status: AC
  Administered 2014-06-09: 12.5 g via INTRAVENOUS
  Filled 2014-06-09: qty 500

## 2014-06-09 MED ORDER — ALBUMIN HUMAN 5 % IV SOLN
12.5000 g | Freq: Once | INTRAVENOUS | Status: AC
  Administered 2014-06-09: 12.5 g via INTRAVENOUS

## 2014-06-09 MED ORDER — ENOXAPARIN SODIUM 30 MG/0.3ML ~~LOC~~ SOLN
30.0000 mg | SUBCUTANEOUS | Status: DC
Start: 2014-06-09 — End: 2014-06-24
  Administered 2014-06-09 – 2014-06-24 (×16): 30 mg via SUBCUTANEOUS
  Filled 2014-06-09 (×16): qty 0.3

## 2014-06-09 MED ORDER — ALBUMIN HUMAN 5 % IV SOLN: 12.5000 g | Freq: Once | INTRAVENOUS | Status: DC

## 2014-06-09 MED ORDER — DOPAMINE-DEXTROSE 3.2-5 MG/ML-% IV SOLN
2.5000 ug/kg/min | INTRAVENOUS | Status: DC
  Administered 2014-06-09: 2.5 ug/kg/min via INTRAVENOUS
  Filled 2014-06-09: qty 250

## 2014-06-09 MED ORDER — SODIUM CHLORIDE 0.9 % IJ SOLN: 10.0000 mL | INTRAMUSCULAR | Status: DC | PRN

## 2014-06-09 MED ORDER — SODIUM CHLORIDE 0.9 % IV SOLN
Freq: Once | INTRAVENOUS | Status: AC
  Administered 2014-06-09: 05:00:00 via INTRAVENOUS

## 2014-06-09 MED ORDER — DEXTROSE-NACL 5-0.45 % IV SOLN
INTRAVENOUS | Status: DC
  Administered 2014-06-09 (×2): via INTRAVENOUS
  Administered 2014-06-10: 75 mL/h via INTRAVENOUS
  Administered 2014-06-11: 04:00:00 via INTRAVENOUS

## 2014-06-09 NOTE — Op Note (Signed)
[name], TRAYNOR NO.:  [id number]  MEDICAL RECORD NO.:  [id number]  LOCATION:  2S16C                        FACILITY:  [facility]  PHYSICIAN:  Revonda Standard. [name], M.D.DATE OF BIRTH:  [birth year]  DATE OF PROCEDURE:  06/08/2014 DATE OF DISCHARGE:                              OPERATIVE REPORT   PREOPERATIVE DIAGNOSIS:  Recurrent pericardial effusion with early tamponade.  POSTOPERATIVE DIAGNOSIS:  Recurrent pericardial effusion with early tamponade.  PROCEDURE:  Left video-assisted thoracoscopy, pericardial window.  SURGEON:  Revonda Standard. [name], M.D.  ASSISTANT:  Suzzanne Cloud, P.A.  ANESTHESIA:  General.  FINDINGS:  Transesophageal echo showed large pericardial effusion. Effusion resolved postdrainage. 400 mL of serous fluid evacuated.  CLINICAL NOTE:  Ms. Scherer is a 68 year old woman with severe COPD. She had been hospitalized about a month previously and had a subxiphoid pericardial window for a large pericardial effusion.  She recovered well after that, but presented to the office the day prior to surgery with a complaint of swelling in her legs over the past 3-4 days.  She also was short of breath with increased work of breathing.  An echocardiogram showed a recurrent pericardial effusion.  At first, the patient refused to undergo surgical repair.  The indications, risks, benefits, and alternatives were discussed in detail with the patient.  She did not wish to be intubated to have surgery.  Cardiology was consulted, but felt that pericardiocentesis would be high risk due to the effusion being primarily posterior.  After reconsideration and discussion with multiple physicians, she elected to proceed with surgical drainage.  OPERATIVE NOTE:  Mrs. Carvey was brought to the operating room on June 08, 2014, she was unable to lie flat initially, she was induced while upright.  She then was placed in a supine position and intubated by  Anesthesia with a double-lumen endotracheal tube.  Intravenous antibiotics were administered.  Sequential compressive devices were placed on the calves for DVT prophylaxis.  A Foley catheter was placed. She was placed in a right lateral decubitus position and the left chest was prepped and draped in usual sterile fashion.  Single lung ventilation of the right lung was initiated and was tolerated well throughout the procedure.  A small port incision was made posteriorly below the tip of the scapula and a 5-mm port was inserted into the chest.  The thoracoscope was inserted.  A small anterolateral incision was made approximately 4 cm in length, this was directly over the pericardium.  The pericardium was bulging from the fluid, but was not under tension.  The phrenic nerve was identified.  The pericardium was grasped and a window was created anterior to the phrenic nerve, approximately 2 x 2 cm in size.  A sucker was placed into the pericardium and 400 mL of serous fluid was evacuated, this was sent for culture as well as chemistries. Transesophageal echocardiography showed no residual effusion.  Next, a second window was created posterior to the phrenic nerve.  This window was slightly larger at approximately 3 x 3 cm in size.  Both pieces of pericardium were sent for pathology, one piece was also sent for culture.  Of note, the pericardium appeared relatively normal with no significant inflammatory changes seen.  A 28-French Blake drain was placed through a separate stab incision and directed through the posterior window into a posterior positioning within the pericardium.  A 19-French Blake drain was placed through a separate stab incision and directed anteriorly and over to the right side.  Both were secured at the skin with #1 silk sutures.  A final inspection was made and the fluid that had spilled into the pleural space was evacuated.  The left lung was reinflated.  The 28-French  drain was placed to a Pleur-Evac, and the 19- [place] drain was placed to bulb suction.  The working incision was closed with a #1 Vicryl fascial suture followed by the 2-0 Vicryl subcutaneous suture and a 3-0 Vicryl subcuticular suture.  The small port incision posteriorly was closed with a subcuticular suture only. The patient was placed back in the supine position.  She was assessed for extubation, but was not felt strong enough to extubate.  The endotracheal tube was switched out to a single lumen endotracheal tube. She was then transported to the surgical intensive care unit in good condition.     Revonda Standard [name], M.D.     SCH/MEDQ  D:  06/08/2014  T:  06/09/2014  Job:  [id number]

## 2014-06-09 NOTE — Progress Notes (Signed)
1 Day Post-Op Procedure(s) (LRB): PERICARDIAL WINDOW (N/A) VIDEO ASSISTED THORACOSCOPY (VATS) (Left) Subjective: Voice is weak Just extubated Does have some incisional pain  Objective: Vital signs in last 24 hours: Temp:  [98 F (36.7 C)-99.4 F (37.4 C)] 98.4 F (36.9 C) (12/24 0737) Pulse Rate:  [66-108] 100 (12/24 0800) Cardiac Rhythm:  [-] Normal sinus rhythm (12/24 0800) Resp:  [8-26] 22 (12/24 0800) BP: (76-172)/(36-103) 99/54 mmHg (12/24 0800) SpO2:  [96 %-100 %] 99 % (12/24 0829) Arterial Line BP: (68-144)/(33-58) 112/52 mmHg (12/24 0800) FiO2 (%):  [40 %] 40 % (12/24 0800)  Hemodynamic parameters for last 24 hours: CVP:  [5 mmHg-11 mmHg] 8 mmHg  Intake/Output from previous day: 12/23 0701 - 12/24 0700 In: 4304 [I.V.:3349; Blood:405; IV Piggyback:550] Out: 1601 [Urine:1020; Drains:145; Blood:150; Chest Tube:490] Intake/Output this shift: Total I/O In: 106.2 [I.V.:106.2] Out: 95 [Urine:35; Chest Tube:60]  General appearance: alert, cooperative and no distress Neurologic: intact Heart: regular rate and rhythm Lungs: diminished breath sounds bibasilar serous drainage from pericardial tubes  Lab Results:  Recent Labs  06/07/14 2000 06/09/14 0155  WBC 5.1 8.4  HGB 9.2* 7.6*  HCT 30.8* 24.8*  PLT 164 148*   BMET:  Recent Labs  06/07/14 2000 06/08/14 1456 06/09/14 0155  NA 142  --  135  K 4.6  --  4.2  CL 94*  --  95*  CO2 40*  --  36*  GLUCOSE 114* 109* 168*  BUN 10  --  13  CREATININE 0.97  --  1.72*  CALCIUM 9.6  --  8.9    PT/INR:  Recent Labs  06/07/14 2000  LABPROT 12.6  INR 0.93   ABG    Component Value Date/Time   PHART 7.422 06/09/2014 0557   HCO3 34.8* 06/09/2014 0557   TCO2 36 06/09/2014 0557   O2SAT 90.0 06/09/2014 0557   CBG (last 3)   Recent Labs  06/09/14 0011 06/09/14 0335 06/09/14 0738  GLUCAP 141* 158* 103*    Assessment/Plan: S/P Procedure(s) (LRB): PERICARDIAL WINDOW (N/A) VIDEO ASSISTED THORACOSCOPY  (VATS) (Left) POD # 1 L VATS pericardial window  CV- hemodynamics stable  Keep pericardial drains until dry  RESP- extubated. No reintubation per patient's wishes  Pulmonary/ CCM managing COPD  RENAL- oliguric acute renal failure- likely ATN due to pericardial effusion  started on dopamine overnight- will continue today  Continue IVF, take K out of IVF  ENDO- CBG OK  SCD + enoxaparin for DVT prophylaxis     LOS: 2 days    [name] C 06/09/2014

## 2014-06-09 NOTE — Procedures (Signed)
Extubation Procedure Note  Patient Details:   Name: [name] DOB: 08-20-1945 MRN: [id number]   Airway Documentation:     Evaluation  O2 sats: stable throughout Complications: No apparent complications Patient did tolerate procedure well. Bilateral Breath Sounds: Clear   Yes  Wears home o2 2l/min , placed on 5l/min [place], wean to 2l/min, as tolerated to keep sp02 92-95% IS instructed 259ml achieved.  Revonda Standard 06/09/2014, 10:04 AM

## 2014-06-09 NOTE — Anesthesia Postprocedure Evaluation (Signed)
  Anesthesia Post-op Note  Patient: [name]  Procedure(s) Performed: Procedure(s): PERICARDIAL WINDOW (N/A) VIDEO ASSISTED THORACOSCOPY (VATS) (Left)  Patient Location: ICU  Anesthesia Type:General  Level of Consciousness: sedated  Airway and Oxygen Therapy: Patient remains intubated per anesthesia plan  Post-op Pain: none  Post-op Assessment: Post-op Vital signs reviewed, Patient's Cardiovascular Status Stable, Respiratory Function Stable and Patent Airway  Post-op Vital Signs: Reviewed and stable  Last Vitals:  Filed Vitals:   06/09/14 1100  BP: 99/55  Pulse: 102  Temp: 36.8 C  Resp: 23    Complications: No apparent anesthesia complications

## 2014-06-09 NOTE — Progress Notes (Signed)
Fentanyl 262ml flushed down sink witnessed by Raliegh Ip, RN

## 2014-06-09 NOTE — Progress Notes (Signed)
RT note-switched to ventilator wean 10/5. Cont. To monitor.

## 2014-06-09 NOTE — Progress Notes (Signed)
PULMONARY / CRITICAL CARE MEDICINE   Name: [name] MRN: [id number] DOB: [birth year]    ADMISSION DATE:  06/07/2014 CONSULTATION DATE:  06/07/2014  REFERRING MD :  Dr. [name]  CHIEF COMPLAINT:  Lower extremity edema  INITIAL PRESENTATION: 68 year old female with recent admissions for pericardial effusions and recent pericardial window, presented to [place] office 12/22 for f/u OV. She was noted to have severe lower extremity edema. Echo was ordered and showed a recurrence of effusion and early tamponade physiology. She was admitted to their service. PCCM to consult.    STUDIES:  Spirometry Nov 2012: Fev1 0.85L/45% 2D Echo 12/22 >  Mod LVH, LVEF 55-60%, Grade 1 DD. PA peak pressure 75 mmHg. Large pericardial effusion, c/w early tamponade.   SIGNIFICANT EVENTS: 12/23 Pericardial window 12/24 Passed SBT and extubated  SUBJECTIVE:  Passed SBT. Extubated. Tolerating initially. Cognition intact  VITAL SIGNS: Temp:  [98 F (36.7 C)-99.4 F (37.4 C)] 98.4 F (36.9 C) (12/24 0737) Pulse Rate:  [66-108] 102 (12/24 1100) Resp:  [8-26] 23 (12/24 1100) BP: (76-167)/(36-90) 99/55 mmHg (12/24 1100) SpO2:  [96 %-100 %] 97 % (12/24 1100) Arterial Line BP: (68-144)/(33-58) 125/51 mmHg (12/24 1100) FiO2 (%):  [40 %] 40 % (12/24 0800) HEMODYNAMICS: CVP:  [3 mmHg-11 mmHg] 3 mmHg VENTILATOR SETTINGS: Vent Mode:  [-] PSV;CPAP FiO2 (%):  [40 %] 40 % Set Rate:  [14 bmp] 14 bmp Vt Set:  [410 mL] 410 mL PEEP:  [5 cmH20] 5 cmH20 Pressure Support:  [10 cmH20] 10 cmH20 Plateau Pressure:  [16 cmH20-19 cmH20] 18 cmH20 INTAKE / OUTPUT:  Intake/Output Summary (Last 24 hours) at 06/09/14 1116 Last data filed at 06/09/14 1100  Gross per 24 hour  Intake 4277.58 ml  Output   1300 ml  Net 2977.58 ml    PHYSICAL EXAMINATION: General: NAD HEENT: WNL PULM: diminished, no wheezing CV: Reg, no M AB: BS+, soft, nontender Ext: warm, no edema Neuro A&Ox4,  anxious   LABS:  CBC  Recent Labs Lab 06/07/14 2000 06/09/14 0155  WBC 5.1 8.4  HGB 9.2* 7.6*  HCT 30.8* 24.8*  PLT 164 148*   Coag's  Recent Labs Lab 06/07/14 2000  INR 0.93   BMET  Recent Labs Lab 06/07/14 2000 06/08/14 1456 06/09/14 0155  NA 142  --  135  K 4.6  --  4.2  CL 94*  --  95*  CO2 40*  --  36*  BUN 10  --  13  CREATININE 0.97  --  1.72*  GLUCOSE 114* 109* 168*   Electrolytes  Recent Labs Lab 06/07/14 2000 06/09/14 0155  CALCIUM 9.6 8.9   Sepsis Markers No results for input(s): LATICACIDVEN, PROCALCITON, O2SATVEN in the last 168 hours. ABG  Recent Labs Lab 06/08/14 1853 06/09/14 0557  PHART 7.315* 7.422  PCO2ART 67.7* 53.5*  PO2ART 58.0* 60.0*   Liver Enzymes No results for input(s): AST, ALT, ALKPHOS, BILITOT, ALBUMIN in the last 168 hours. Cardiac Enzymes No results for input(s): TROPONINI, PROBNP in the last 168 hours. Glucose  Recent Labs Lab 06/08/14 1153 06/08/14 1743 06/08/14 1913 06/09/14 0011 06/09/14 0335 06/09/14 0738  GLUCAP 108* 127* 122* 141* 158* 103*    Imaging Dg Chest Port 1 View  06/08/2014   CLINICAL DATA:  Pericardial effusion.  EXAM: PORTABLE CHEST - 1 VIEW  COMPARISON:  06/07/2014, 05/23/2014, 05/15/2014, 05/11/2014.  FINDINGS: Endotracheal tube and right IJ line in good anatomic position. Interim removal of NG tube. Cardiomegaly slight prominence of pulmonary  vascularity. Previously identified pulmonary interstitial edema has partially clear. Bibasilar infiltrates with small pleural effusions again noted. These changes could be related to pulmonary edema and/or basilar pneumonia.  IMPRESSION: 1. Endotracheal tube and right IJ line in stable position. Interim removal of NG tube. 2. Congestive heart failure with improving pulmonary interstitial edema. Persistent bibasilar alveolar infiltrates and small pleural effusions. These changes could be related congestive heart failure and/or pneumonia.    Electronically Signed   By: Marcello Moores  Register   On: 06/08/2014 16:37     ASSESSMENT / PLAN:  PULMONARY A: Chronic respiratory failure  Severe COPD without acute exacerbation (on 2L at home) RLL infiltrate vs ATX Mild pulmonary edema P:   Monitor in ICU post extubation Continue supplemental O2 to maintain SpO2 > 90% Continue preadmission brovana, budesonide PRN Duoneb Need to clarify re-intubation status  CARDIOVASCULAR A:  Recurrent pericardial effusion, unclear etiology HTN, controlled  P:  Post op managment per cardiology/TCTS Cont anti-hypertensive meds  RENAL A:   AKI, borderline oliguria Very poor candidate for HD (she would probably refuse) P:   Monitor BMET intermittently Monitor I/Os Correct electrolytes as indicated  GASTROINTESTINAL A:   GERD P:   SUP: PO protonix NPO post extubation  Advance diet when able  HEMATOLOGIC A:   Anemia without overt bleedin P:  DVT px: SQ heparin Monitor CBC intermittently Transfuse per usual ICU guidelines  INFECTIOUS A:   No acute issues P:   Monitor off ABX   ENDOCRINE A:  DM 2 P:   CBG monitoring SSI  NEUROLOGIC A:   No acute issues P:   RASS goal: 0 Monitor   I have taken on PCCM service @ request of Dr Ronnald Collum, MD ; [facility] service Mobile [phone].  After 5:30 PM or weekends, call [phone]

## 2014-06-09 NOTE — Progress Notes (Signed)
Patient ID: [name], female   DOB: 1945-12-04, 68 y.o.   MRN: [id number] EVENING ROUNDS NOTE :     [address]       [place] [address]             [phone]                 1 Day Post-Op Procedure(s) (LRB): PERICARDIAL WINDOW (N/A) VIDEO ASSISTED THORACOSCOPY (VATS) (Left)  Total Length of Stay:  LOS: 2 days  BP 96/57 mmHg  Pulse 97  Temp(Src) 98.2 F (36.8 C) (Oral)  Resp 19  Wt 175 lb 0.7 oz (79.4 kg)  SpO2 98%  .Intake/Output      12/23 0701 - 12/24 0700 12/24 0701 - 12/25 0700   P.O.     I.V. (mL/kg) 3349 (42.2) 645.8 (8.1)   Blood 405    IV Piggyback 550    Total Intake(mL/kg) 4304 (54.2) 645.8 (8.1)   Urine (mL/kg/hr) 1020 (0.5) 485 (0.6)   Drains 145 (0.1)    Blood 150 (0.1)    Chest Tube 490 (0.3) 60 (0.1)   Total Output 1805 545   Net +2499 +100.8        Stool Occurrence 1 x      . dextrose 5 % and 0.45% NaCl 75 mL/hr at 06/09/14 1700  . DOPamine 2.5 mcg/kg/min (06/09/14 1700)  . fentaNYL infusion INTRAVENOUS Stopped (06/09/14 0900)     Lab Results  Component Value Date   WBC 8.4 06/09/2014   HGB 7.6* 06/09/2014   HCT 24.8* 06/09/2014   PLT 148* 06/09/2014   GLUCOSE 168* 06/09/2014   TRIG 77 05/20/2014   HDL 71 01/21/2014   LDLCALC 155* 01/21/2014   ALT 24 05/20/2014   AST 19 05/20/2014   NA 135 06/09/2014   K 4.2 06/09/2014   CL 95* 06/09/2014   CREATININE 1.72* 06/09/2014   BUN 13 06/09/2014   CO2 36* 06/09/2014   TSH 0.339* 03/16/2011   INR 0.93 06/07/2014   HGBA1C 6.4* 01/21/2014   Stable off vent, breathing better then yesterday  Grace Isaac MD  Beeper [phone] Office [phone] 06/09/2014 5:32 PM

## 2014-06-10 ENCOUNTER — Inpatient Hospital Stay (HOSPITAL_COMMUNITY): Payer: Medicare Other

## 2014-06-10 DIAGNOSIS — I309 Acute pericarditis, unspecified: Secondary | ICD-10-CM

## 2014-06-10 DIAGNOSIS — J9601 Acute respiratory failure with hypoxia: Secondary | ICD-10-CM

## 2014-06-10 DIAGNOSIS — J438 Other emphysema: Secondary | ICD-10-CM

## 2014-06-10 LAB — TYPE AND SCREEN
ABO/RH(D): O POS
Antibody Screen: NEGATIVE
Unit division: 0

## 2014-06-10 LAB — COMPREHENSIVE METABOLIC PANEL
ALBUMIN: 2.4 g/dL — AB (ref 3.5–5.2)
ALT: 22 U/L (ref 0–35)
AST: 15 U/L (ref 0–37)
Alkaline Phosphatase: 51 U/L (ref 39–117)
Anion gap: 5 (ref 5–15)
BUN: 14 mg/dL (ref 6–23)
CHLORIDE: 99 meq/L (ref 96–112)
CO2: 34 mmol/L — ABNORMAL HIGH (ref 19–32)
CREATININE: 1.36 mg/dL — AB (ref 0.50–1.10)
Calcium: 8.4 mg/dL (ref 8.4–10.5)
GFR calc Af Amer: 45 mL/min — ABNORMAL LOW (ref >90)
GFR calc non Af Amer: 39 mL/min — ABNORMAL LOW (ref >90)
Glucose, Bld: 102 mg/dL — ABNORMAL HIGH (ref 70–99)
Potassium: 3.7 mmol/L (ref 3.5–5.1)
Sodium: 138 mmol/L (ref 135–145)
TOTAL PROTEIN: 4.7 g/dL — AB (ref 6.0–8.3)
Total Bilirubin: 0.2 mg/dL — ABNORMAL LOW (ref 0.3–1.2)

## 2014-06-10 LAB — CBC
HEMATOCRIT: 30.1 % — AB (ref 36.0–46.0)
Hemoglobin: 9.4 g/dL — ABNORMAL LOW (ref 12.0–15.0)
MCH: 28.8 pg (ref 26.0–34.0)
MCHC: 31.2 g/dL (ref 30.0–36.0)
MCV: 92.3 fL (ref 78.0–100.0)
PLATELETS: 131 10*3/uL — AB (ref 150–400)
RBC: 3.26 MIL/uL — ABNORMAL LOW (ref 3.87–5.11)
RDW: 18.3 % — AB (ref 11.5–15.5)
WBC: 7.3 10*3/uL (ref 4.0–10.5)

## 2014-06-10 LAB — GLUCOSE, CAPILLARY
GLUCOSE-CAPILLARY: 104 mg/dL — AB (ref 70–99)
GLUCOSE-CAPILLARY: 108 mg/dL — AB (ref 70–99)
Glucose-Capillary: 110 mg/dL — ABNORMAL HIGH (ref 70–99)
Glucose-Capillary: 122 mg/dL — ABNORMAL HIGH (ref 70–99)

## 2014-06-10 NOTE — Progress Notes (Signed)
Patient ID: [name], female   DOB: 1946-03-21, 68 y.o.   MRN: [id number] TCTS DAILY ICU PROGRESS NOTE                   [address]            [place] [address]          [phone]   2 Days Post-Op Procedure(s) (LRB): PERICARDIAL WINDOW (N/A) VIDEO ASSISTED THORACOSCOPY (VATS) (Left)  Total Length of Stay:  LOS: 3 days   Subjective: Feels better up in chair breathing better  Objective: Vital signs in last 24 hours: Temp:  [98.1 F (36.7 C)-98.9 F (37.2 C)] 98.5 F (36.9 C) (12/25 0829) Pulse Rate:  [86-103] 86 (12/25 1000) Cardiac Rhythm:  [-] Normal sinus rhythm (12/25 0800) Resp:  [19-31] 21 (12/25 1000) BP: (89-134)/(52-70) 95/58 mmHg (12/25 1000) SpO2:  [93 %-100 %] 93 % (12/25 1000) Arterial Line BP: (92-150)/(45-72) 120/56 mmHg (12/25 0900) Weight:  [179 lb 4.8 oz (81.33 kg)] 179 lb 4.8 oz (81.33 kg) (12/25 0600)  Filed Weights   06/08/14 0500 06/10/14 0600  Weight: 175 lb 0.7 oz (79.4 kg) 179 lb 4.8 oz (81.33 kg)    Weight change:    Hemodynamic parameters for last 24 hours: CVP:  [3 mmHg-20 mmHg] 7 mmHg  Intake/Output from previous day: 12/24 0701 - 12/25 0700 In: 1947.6 [P.O.:200; I.V.:1747.6] Out: 2325 [Urine:1675; Drains:100; Chest Tube:550]  Intake/Output this shift: Total I/O In: 336.1 [P.O.:100; I.V.:236.1] Out: 95 [Urine:75; Chest Tube:20]  Current Meds: Scheduled Meds: . acetaminophen  1,000 mg Oral 4 times per day   Or  . acetaminophen (TYLENOL) oral liquid 160 mg/5 mL  1,000 mg Oral 4 times per day  . antiseptic oral rinse  7 mL Mouth Rinse BID  . arformoterol  15 mcg Nebulization BID  . bisacodyl  10 mg Oral Daily  . colchicine  0.6 mg Oral BID  . enoxaparin (LOVENOX) injection  30 mg Subcutaneous Q24H  . insulin aspart  0-20 Units Subcutaneous TID WC  . multivitamin with minerals  1 tablet Oral Daily  . pantoprazole  40 mg Oral Daily  . senna-docusate  1 tablet Oral QHS  . sertraline  50 mg Oral QHS    . sodium chloride  10-40 mL Intracatheter Q12H   Continuous Infusions: . dextrose 5 % and 0.45% NaCl 75 mL/hr at 06/09/14 2355  . DOPamine 2.5 mcg/kg/min (06/09/14 1900)   PRN Meds:.albuterol, ALPRAZolam, fentaNYL, ondansetron (ZOFRAN) IV, oxyCODONE, potassium chloride, sodium chloride, traMADol  General appearance: alert and cooperative Neurologic: intact Heart: regular rate and rhythm, S1, S2 normal, no murmur, click, rub or gallop Lungs: diminished breath sounds bibasilar Abdomen: soft, non-tender; bowel sounds normal; no masses,  no organomegaly Extremities: extremities normal, atraumatic, no cyanosis or edema and Homans sign is negative, no sign of DVT Wound: still drainage from mt tubes leave for now  Lab Results: CBC: Recent Labs  06/09/14 0155 06/10/14 0500  WBC 8.4 7.3  HGB 7.6* 9.4*  HCT 24.8* 30.1*  PLT 148* 131*   BMET:  Recent Labs  06/09/14 0155 06/10/14 0500  NA 135 138  K 4.2 3.7  CL 95* 99  CO2 36* 34*  GLUCOSE 168* 102*  BUN 13 14  CREATININE 1.72* 1.36*  CALCIUM 8.9 8.4    PT/INR:  Recent Labs  06/07/14 2000  LABPROT 12.6  INR 0.93   Radiology: Dg Chest Port 1 View  06/10/2014   CLINICAL  DATA:  Status post pericardial window  EXAM: PORTABLE CHEST - 1 VIEW  COMPARISON:  06/09/2014  FINDINGS: The endotracheal tube and nasogastric catheter been removed in the interval. A right jugular line remains. Drainage catheter remains over the left lung base/ pericardium. The cardiac shadow remains enlarged. Vascular congestion remains. Right basilar atelectasis is again noted.  IMPRESSION: Interval removal of nasogastric catheter and endotracheal tube. No new focal abnormality is seen.   Electronically Signed   By: Inez Catalina M.D.   On: 06/10/2014 07:54   Portable Chest Xray  06/09/2014   CLINICAL DATA:  Acute respiratory failure with hypoxia  EXAM: PORTABLE CHEST - 1 VIEW  COMPARISON:  06/08/2014  FINDINGS: Endotracheal tube in good position. Right  jugular catheter tip in the SVC. NG tube in place with the tip not visualized. No pneumothorax  Allowing for lighter technique on the current study, no significant change from yesterday. Pulmonary vascular congestion. Bibasilar atelectasis unchanged.  IMPRESSION: Allowing for later technique, no significant change. Vascular congestion and bibasilar atelectasis.  Support lines remain in good position.   Electronically Signed   By: Franchot Gallo M.D.   On: 06/09/2014 07:21   Dg Chest Port 1 View  06/08/2014   CLINICAL DATA:  Pericardial effusion.  EXAM: PORTABLE CHEST - 1 VIEW  COMPARISON:  06/07/2014, 05/23/2014, 05/15/2014, 05/11/2014.  FINDINGS: Endotracheal tube and right IJ line in good anatomic position. Interim removal of NG tube. Cardiomegaly slight prominence of pulmonary vascularity. Previously identified pulmonary interstitial edema has partially clear. Bibasilar infiltrates with small pleural effusions again noted. These changes could be related to pulmonary edema and/or basilar pneumonia.  IMPRESSION: 1. Endotracheal tube and right IJ line in stable position. Interim removal of NG tube. 2. Congestive heart failure with improving pulmonary interstitial edema. Persistent bibasilar alveolar infiltrates and small pleural effusions. These changes could be related congestive heart failure and/or pneumonia.   Electronically Signed   By: Marcello Moores  Register   On: 06/08/2014 16:37     Assessment/Plan: S/P Procedure(s) (LRB): PERICARDIAL WINDOW (N/A) VIDEO ASSISTED THORACOSCOPY (VATS) (Left) Mobilize Diuresis dopamine and foley orders per CCM Leave mt tubes until dry - will not remove today    Wilbert Hayashi B 06/10/2014 10:50 AM

## 2014-06-10 NOTE — Progress Notes (Signed)
PULMONARY / CRITICAL CARE MEDICINE   Name: [name] MRN: [id number] DOB: [birth year]    ADMISSION DATE:  06/07/2014 CONSULTATION DATE:  06/07/2014  REFERRING MD :  Dr. [name]  CHIEF COMPLAINT:  Lower extremity edema  INITIAL PRESENTATION: 68 year old female with recent admissions for pericardial effusions and recent pericardial window, presented to [place] office 12/22 for f/u OV. She was noted to have severe lower extremity edema. Echo was ordered and showed a recurrence of effusion and early tamponade physiology. She was admitted to their service. PCCM to consult.    STUDIES:  Spirometry Nov 2012: Fev1 0.85L/45% 2D Echo 12/22 >  Mod LVH, LVEF 55-60%, Grade 1 DD. PA peak pressure 75 mmHg. Large pericardial effusion, c/w early tamponade.   SIGNIFICANT EVENTS: 12/23 Pericardial window 12/24 Passed SBT and extubated  SUBJECTIVE:  Afebrile Denies pain oob to chair On low dose dopamine  VITAL SIGNS: Temp:  [98.1 F (36.7 C)-98.9 F (37.2 C)] 98.5 F (36.9 C) (12/25 0829) Pulse Rate:  [86-105] 88 (12/25 0800) Resp:  [19-31] 23 (12/25 0800) BP: (89-134)/(52-70) 102/55 mmHg (12/25 0800) SpO2:  [96 %-100 %] 100 % (12/25 0800) Arterial Line BP: (92-150)/(45-72) 114/54 mmHg (12/25 0800) Weight:  [81.33 kg (179 lb 4.8 oz)] 81.33 kg (179 lb 4.8 oz) (12/25 0600) HEMODYNAMICS: CVP:  [3 mmHg-20 mmHg] 9 mmHg VENTILATOR SETTINGS:   INTAKE / OUTPUT:  Intake/Output Summary (Last 24 hours) at 06/10/14 0839 Last data filed at 06/10/14 0800  Gross per 24 hour  Intake 1920.05 ml  Output   2285 ml  Net -364.95 ml    PHYSICAL EXAMINATION: General: NAD HEENT: WNL PULM: diminished, no wheezing CV: Reg, no M AB: BS+, soft, nontender Ext: warm, no edema Neuro A&Ox4, anxious   LABS:  CBC  Recent Labs Lab 06/07/14 2000 06/09/14 0155 06/10/14 0500  WBC 5.1 8.4 7.3  HGB 9.2* 7.6* 9.4*  HCT 30.8* 24.8* 30.1*  PLT 164 148* 131*   Coag's  Recent Labs Lab  06/07/14 2000  INR 0.93   BMET  Recent Labs Lab 06/07/14 2000 06/08/14 1456 06/09/14 0155 06/10/14 0500  NA 142  --  135 138  K 4.6  --  4.2 3.7  CL 94*  --  95* 99  CO2 40*  --  36* 34*  BUN 10  --  13 14  CREATININE 0.97  --  1.72* 1.36*  GLUCOSE 114* 109* 168* 102*   Electrolytes  Recent Labs Lab 06/07/14 2000 06/09/14 0155 06/10/14 0500  CALCIUM 9.6 8.9 8.4   Sepsis Markers No results for input(s): LATICACIDVEN, PROCALCITON, O2SATVEN in the last 168 hours. ABG  Recent Labs Lab 06/08/14 1853 06/09/14 0557  PHART 7.315* 7.422  PCO2ART 67.7* 53.5*  PO2ART 58.0* 60.0*   Liver Enzymes  Recent Labs Lab 06/10/14 0500  AST 15  ALT 22  ALKPHOS 51  BILITOT 0.2*  ALBUMIN 2.4*   Cardiac Enzymes No results for input(s): TROPONINI, PROBNP in the last 168 hours. Glucose  Recent Labs Lab 06/09/14 1115 06/09/14 1605 06/09/14 1942 06/09/14 2337 06/10/14 0343 06/10/14 0713  GLUCAP 114* 125* 118* 104* 104* 108*    Imaging Portable Chest Xray  06/09/2014   CLINICAL DATA:  Acute respiratory failure with hypoxia  EXAM: PORTABLE CHEST - 1 VIEW  COMPARISON:  06/08/2014  FINDINGS: Endotracheal tube in good position. Right jugular catheter tip in the SVC. NG tube in place with the tip not visualized. No pneumothorax  Allowing for lighter technique on the  current study, no significant change from yesterday. Pulmonary vascular congestion. Bibasilar atelectasis unchanged.  IMPRESSION: Allowing for later technique, no significant change. Vascular congestion and bibasilar atelectasis.  Support lines remain in good position.   Electronically Signed   By: Franchot Gallo M.D.   On: 06/09/2014 07:21     ASSESSMENT / PLAN:  PULMONARY A: Chronic respiratory failure  Severe COPD without acute exacerbation (on 2L at home) RLL infiltrate vs ATX Mild pulmonary edema P:   Continue supplemental O2 to maintain SpO2 > 90% Continue preadmission brovana, budesonide PRN  Duoneb NO re-intubation   CARDIOVASCULAR A:  Recurrent pericardial effusion, unclear etiology HTN, controlled  P:  Post op managment per cardiology/TCTS - dc dopamine Hold anti-hypertensive meds  RENAL A:   AKI -improving UO  P:   Monitor BMET intermittently Monitor I/Os Correct electrolytes as indicated  GASTROINTESTINAL A:   GERD P:   SUP: PO protonix Advance diet when able  HEMATOLOGIC A:   Anemia without overt bleedin P:  DVT px: SQ heparin Monitor CBC intermittently Transfuse per usual ICU guidelines  INFECTIOUS A:   No acute issues P:   Monitor off ABX   ENDOCRINE A:  DM 2 P:   CBG monitoring SSI  NEUROLOGIC A:   No acute issues P:   RASS goal: 0 Monitor  Summary - improved, can transfer out once off dopamine  The patient is critically ill with multiple organ systems failure and requires high complexity decision making for assessment and support, frequent evaluation and titration of therapies, application of advanced monitoring technologies and extensive interpretation of multiple databases. Critical Care Time devoted to patient care services described in this note independent of APP time is 31 minutes.  Rigoberto Noel MD

## 2014-06-10 NOTE — Plan of Care (Signed)
Problem: Phase II Progression Outcomes Goal: Progress activity as tolerated unless otherwise ordered Outcome: Progressing Patient out of bed and in chair this morning.

## 2014-06-11 ENCOUNTER — Inpatient Hospital Stay (HOSPITAL_COMMUNITY): Payer: Medicare Other

## 2014-06-11 LAB — BASIC METABOLIC PANEL
Anion gap: 5 (ref 5–15)
BUN: 15 mg/dL (ref 6–23)
CALCIUM: 8.7 mg/dL (ref 8.4–10.5)
CO2: 35 mmol/L — AB (ref 19–32)
Chloride: 97 mEq/L (ref 96–112)
Creatinine, Ser: 1.18 mg/dL — ABNORMAL HIGH (ref 0.50–1.10)
GFR calc Af Amer: 54 mL/min — ABNORMAL LOW (ref >90)
GFR, EST NON AFRICAN AMERICAN: 46 mL/min — AB (ref >90)
GLUCOSE: 104 mg/dL — AB (ref 70–99)
Potassium: 3.8 mmol/L (ref 3.5–5.1)
SODIUM: 137 mmol/L (ref 135–145)

## 2014-06-11 LAB — GLUCOSE, CAPILLARY
GLUCOSE-CAPILLARY: 100 mg/dL — AB (ref 70–99)
GLUCOSE-CAPILLARY: 127 mg/dL — AB (ref 70–99)
GLUCOSE-CAPILLARY: 98 mg/dL (ref 70–99)
GLUCOSE-CAPILLARY: 95 mg/dL (ref 70–99)
Glucose-Capillary: 100 mg/dL — ABNORMAL HIGH (ref 70–99)
Glucose-Capillary: 164 mg/dL — ABNORMAL HIGH (ref 70–99)
Glucose-Capillary: 99 mg/dL (ref 70–99)

## 2014-06-11 LAB — CBC
HEMATOCRIT: 33.4 % — AB (ref 36.0–46.0)
Hemoglobin: 10 g/dL — ABNORMAL LOW (ref 12.0–15.0)
MCH: 27.2 pg (ref 26.0–34.0)
MCHC: 29.9 g/dL — ABNORMAL LOW (ref 30.0–36.0)
MCV: 91 fL (ref 78.0–100.0)
Platelets: 159 10*3/uL (ref 150–400)
RBC: 3.67 MIL/uL — ABNORMAL LOW (ref 3.87–5.11)
RDW: 17.8 % — AB (ref 11.5–15.5)
WBC: 6.8 10*3/uL (ref 4.0–10.5)

## 2014-06-11 LAB — BODY FLUID CULTURE: Culture: NO GROWTH

## 2014-06-11 MED ORDER — FUROSEMIDE 10 MG/ML IJ SOLN
40.0000 mg | Freq: Once | INTRAMUSCULAR | Status: AC
  Administered 2014-06-11: 40 mg via INTRAVENOUS
  Filled 2014-06-11: qty 4

## 2014-06-11 NOTE — Evaluation (Signed)
Physical Therapy Evaluation Patient Details Name: GEORGANN BRAMBLE MRN: [id number] DOB: May 28, 1946 Today's Date: 06/11/2014   History of Present Illness  68 year old female with recent admissions for pericardial effusions and recent pericardial window, presented to [place] office 12/22 for f/u OV. She was noted to have severe lower extremity edema. Echo was ordered and showed a recurrence of effusion and early tamponade physiology. S/p pericardial window 12/23, extubated 12/24.  Clinical Impression  Pt admitted with above, seen following the above procedure. Pt currently with functional limitations due to the deficits listed below (see PT Problem List). Ambulates slowly up to 300 feet today with frequent standing rest breaks, SpO2 86-91% on 2L supplemental O2. Min guard- min assist with mobility assessed. Pt will benefit from skilled PT to increase their independence and safety with mobility to allow discharge to the venue listed below.    Follow Up Recommendations SNF;Supervision for mobility/OOB    Equipment Recommendations  None recommended by PT    Recommendations for Other Services OT consult     Precautions / Restrictions Precautions Precautions: Fall Restrictions Weight Bearing Restrictions: No      Mobility  Bed Mobility                  Transfers Overall transfer level: Needs assistance Equipment used: Rolling walker (2 wheeled) Transfers: Sit to/from Stand Sit to Stand: Min guard         General transfer comment: Min guard for safety. VC for technique and hand placement. Did not require physical assist from reclining chair  Ambulation/Gait Ambulation/Gait assistance: Min assist Ambulation Distance (Feet): 300 Feet Assistive device: Rolling walker (2 wheeled) Gait Pattern/deviations: Step-through pattern;Decreased stride length;Antalgic;Trunk flexed Gait velocity: decreased Gait velocity interpretation: Below normal speed for age/gender General Gait  Details: Educated on safe DME use with rolling walker. Cues for upright posture throughout bout. Required 5 standing rest breaks to complete distance while on 2L supplemental O2. SpO2 ranged from 86-91% better with pursed-lip breathing. Min assist walker placement intermittently.  Stairs            Wheelchair Mobility    Modified Rankin (Stroke Patients Only)       Balance Overall balance assessment: Needs assistance Sitting-balance support: No upper extremity supported;Feet supported Sitting balance-Leahy Scale: Fair     Standing balance support: Bilateral upper extremity supported Standing balance-Leahy Scale: Poor                               Pertinent Vitals/Pain Pain Assessment: No/denies pain  Rest: HR 90 SpO2 97% on 2L supplemental o2 BP 96/63  Ambulating HR 105 SpO2 86-91% on 2L supplemental O2 BP 112/70     Home Living Family/patient expects to be discharged to:: Skilled nursing facility Living Arrangements: Alone Available Help at Discharge: [place] Type of Home: Apartment Home Access: Stairs to enter Entrance Stairs-Rails: Psychiatric nurse of Steps: 8 Home Layout: One level Home Equipment: Environmental consultant - 4 wheels;Shower seat - built in      Prior Function Level of Independence: Needs assistance   Gait / Transfers Assistance Needed: Was walking with RW with therapy  ADL's / Homemaking Assistance Needed: Pt required assist for ADL's  Comments: uses 2L supplemental O2 at all times, baseline     Hand Dominance   Dominant Hand: Right    Extremity/Trunk Assessment   Upper Extremity Assessment: Defer to OT evaluation  Lower Extremity Assessment: Generalized weakness         Communication   Communication: No difficulties  Cognition Arousal/Alertness: Awake/alert Behavior During Therapy: WFL for tasks assessed/performed Overall Cognitive Status: Within Functional Limits for tasks  assessed                      General Comments      Exercises General Exercises - Lower Extremity Ankle Circles/Pumps: AROM;Both;10 reps;Seated Long Arc Quad: Strengthening;Both;10 reps;Seated Hip Flexion/Marching: Strengthening;Both;10 reps;Seated      Assessment/Plan    PT Assessment Patient needs continued PT services  PT Diagnosis Difficulty walking;Abnormality of gait;Generalized weakness   PT Problem List Decreased strength;Decreased range of motion;Decreased activity tolerance;Decreased balance;Decreased mobility;Decreased knowledge of use of DME;Cardiopulmonary status limiting activity  PT Treatment Interventions DME instruction;Gait training;Functional mobility training;Therapeutic activities;Therapeutic exercise;Balance training;Neuromuscular re-education;Patient/family education;Modalities   PT Goals (Current goals can be found in the Care Plan section) Acute Rehab PT Goals Patient Stated Goal: Back to rehab PT Goal Formulation: With patient Time For Goal Achievement: 06/25/14 Potential to Achieve Goals: Good    Frequency Min 2X/week   Barriers to discharge Decreased caregiver support lives alone    Co-evaluation               End of Session Equipment Utilized During Treatment: Gait belt;Oxygen Activity Tolerance: Patient tolerated treatment well Patient left: in chair;with call bell/phone within reach Nurse Communication: Mobility status         Time: 0910-0940 PT Time Calculation (min) (ACUTE ONLY): 30 min   Charges:   PT Evaluation $Initial PT Evaluation Tier I: 1 Procedure PT Treatments $Gait Training: 8-22 mins   PT G CodesEllouise Newer 06/11/2014, 10:36 AM Elayne Snare, [place]

## 2014-06-11 NOTE — Progress Notes (Signed)
PULMONARY / CRITICAL CARE MEDICINE   Name: [name] MRN: [id number] DOB: February 17, 1946    ADMISSION DATE:  06/07/2014 CONSULTATION DATE:  06/07/2014  REFERRING MD :  Dr. [name]  CHIEF COMPLAINT:  Lower extremity edema  INITIAL PRESENTATION: 68 year old female with recent admissions for pericardial effusions and recent pericardial window, presented to [place] office 12/22 for f/u OV. She was noted to have severe lower extremity edema. Echo was ordered and showed a recurrence of effusion and early tamponade physiology. She was admitted to their service. PCCM to consult.    STUDIES:  Spirometry Nov 2012: Fev1 0.85L/45% 2D Echo 12/22 >  Mod LVH, LVEF 55-60%, Grade 1 DD. PA peak pressure 75 mmHg. Large pericardial effusion, c/w early tamponade.   SIGNIFICANT EVENTS: 12/23 Pericardial window 12/24 Passed SBT and extubated  SUBJECTIVE: Remains On low dose dopamine Afebrile Denies pain oob to chair, ambulated x 2   VITAL SIGNS: Temp:  [97.5 F (36.4 C)-98.5 F (36.9 C)] 97.7 F (36.5 C) (12/26 0700) Pulse Rate:  [86-99] 97 (12/26 0700) Resp:  [20-30] 20 (12/26 0700) BP: (94-120)/(55-74) 96/58 mmHg (12/26 0700) SpO2:  [92 %-100 %] 95 % (12/26 0700) Arterial Line BP: (114-120)/(54-56) 120/56 mmHg (12/25 0900) Weight:  [82 kg (180 lb 12.4 oz)] 82 kg (180 lb 12.4 oz) (12/26 0600) HEMODYNAMICS: CVP:  [7 mmHg-9 mmHg] 7 mmHg VENTILATOR SETTINGS:   INTAKE / OUTPUT:  Intake/Output Summary (Last 24 hours) at 06/11/14 0755 Last data filed at 06/11/14 0700  Gross per 24 hour  Intake 2148.8 ml  Output   1640 ml  Net  508.8 ml    PHYSICAL EXAMINATION: General: NAD HEENT: WNL PULM: diminished, no wheezing CV: Reg, no M AB: BS+, soft, nontender Ext: warm, no edema Neuro A&Ox4, anxious   LABS:  CBC  Recent Labs Lab 06/09/14 0155 06/10/14 0500 06/11/14 0400  WBC 8.4 7.3 6.8  HGB 7.6* 9.4* 10.0*  HCT 24.8* 30.1* 33.4*  PLT 148* 131* 159   Coag's  Recent  Labs Lab 06/07/14 2000  INR 0.93   BMET  Recent Labs Lab 06/09/14 0155 06/10/14 0500 06/11/14 0400  NA 135 138 137  K 4.2 3.7 3.8  CL 95* 99 97  CO2 36* 34* 35*  BUN 13 14 15   CREATININE 1.72* 1.36* 1.18*  GLUCOSE 168* 102* 104*   Electrolytes  Recent Labs Lab 06/09/14 0155 06/10/14 0500 06/11/14 0400  CALCIUM 8.9 8.4 8.7   Sepsis Markers No results for input(s): LATICACIDVEN, PROCALCITON, O2SATVEN in the last 168 hours. ABG  Recent Labs Lab 06/08/14 1853 06/09/14 0557  PHART 7.315* 7.422  PCO2ART 67.7* 53.5*  PO2ART 58.0* 60.0*   Liver Enzymes  Recent Labs Lab 06/10/14 0500  AST 15  ALT 22  ALKPHOS 51  BILITOT 0.2*  ALBUMIN 2.4*   Cardiac Enzymes No results for input(s): TROPONINI, PROBNP in the last 168 hours. Glucose  Recent Labs Lab 06/10/14 0713 06/10/14 1110 06/10/14 1511 06/10/14 1930 06/10/14 2339 06/11/14 0315  GLUCAP 108* 110* 100* 122* 99 100*    Imaging Dg Chest Port 1 View  06/10/2014   CLINICAL DATA:  Status post pericardial window  EXAM: PORTABLE CHEST - 1 VIEW  COMPARISON:  06/09/2014  FINDINGS: The endotracheal tube and nasogastric catheter been removed in the interval. A right jugular line remains. Drainage catheter remains over the left lung base/ pericardium. The cardiac shadow remains enlarged. Vascular congestion remains. Right basilar atelectasis is again noted.  IMPRESSION: Interval removal of nasogastric  catheter and endotracheal tube. No new focal abnormality is seen.   Electronically Signed   By: Inez Catalina M.D.   On: 06/10/2014 07:54     ASSESSMENT / PLAN:  PULMONARY A: Chronic respiratory failure  Severe COPD without acute exacerbation (on 2L at home) RLL infiltrate vs ATX Mild pulmonary edema P:   Continue supplemental O2 to maintain SpO2 > 90% Continue preadmission brovana, budesonide PRN Duoneb NO re-intubation   CARDIOVASCULAR A:  Recurrent pericardial effusion, unclear etiology HTN,  controlled  P:  Post op managment per cardiology/TCTS - dc dopamine Hold anti-hypertensive meds   RENAL A:   AKI -improving UO  P:   Monitor BMET intermittently Monitor I/Os Correct electrolytes as indicated Lasix 40 x 1  GASTROINTESTINAL A:   GERD P:   SUP: PO protonix Advance diet when able  HEMATOLOGIC A:   Anemia without overt bleedin P:  DVT px: SQ heparin Monitor CBC intermittently Transfuse per usual ICU guidelines  INFECTIOUS A:   No acute issues P:   Monitor off ABX   ENDOCRINE A:  DM 2 P:   CBG monitoring SSI  NEUROLOGIC A:   No acute issues P:   RASS goal: 0 Monitor  Summary - improving, can transfer out once off dopamine, diurese  The patient is critically ill with multiple organ systems failure and requires high complexity decision making for assessment and support, frequent evaluation and titration of therapies, application of advanced monitoring technologies and extensive interpretation of multiple databases. Critical Care Time devoted to patient care services described in this note independent of APP time is 31 minutes.   Rigoberto Noel MD

## 2014-06-11 NOTE — Progress Notes (Signed)
Patient ID: [name], female   DOB: 1945/12/21, 68 y.o.   MRN: [id number] TCTS DAILY ICU PROGRESS NOTE                   [address]            [place] [address]          [id number]   3 Days Post-Op Procedure(s) (LRB): PERICARDIAL WINDOW (N/A) VIDEO ASSISTED THORACOSCOPY (VATS) (Left)  Total Length of Stay:  LOS: 4 days   Subjective:  up in chair more sob this am Objective: Vital signs in last 24 hours: Temp:  [97.5 F (36.4 C)-98.1 F (36.7 C)] 97.5 F (36.4 C) (12/26 1100) Pulse Rate:  [86-99] 88 (12/26 1000) Cardiac Rhythm:  [-] Normal sinus rhythm (12/26 0800) Resp:  [19-30] 22 (12/26 1000) BP: (92-120)/(54-74) 92/54 mmHg (12/26 1000) SpO2:  [92 %-98 %] 97 % (12/26 1000) Weight:  [180 lb 12.4 oz (82 kg)] 180 lb 12.4 oz (82 kg) (12/26 0600)  Filed Weights   06/08/14 0500 06/10/14 0600 06/11/14 0600  Weight: 175 lb 0.7 oz (79.4 kg) 179 lb 4.8 oz (81.33 kg) 180 lb 12.4 oz (82 kg)    Weight change: 1 lb 7.6 oz (0.67 kg)   Hemodynamic parameters for last 24 hours:    Intake/Output from previous day: 12/25 0701 - 12/26 0700 In: 2148.8 [P.O.:100; I.V.:2048.8] Out: 1640 [Urine:1280; Drains:70; Chest Tube:290]  Intake/Output this shift: Total I/O In: 233.9 [P.O.:150; I.V.:83.9] Out: 655 [Urine:575; Chest Tube:80]  Current Meds: Scheduled Meds: . acetaminophen  1,000 mg Oral 4 times per day   Or  . acetaminophen (TYLENOL) oral liquid 160 mg/5 mL  1,000 mg Oral 4 times per day  . antiseptic oral rinse  7 mL Mouth Rinse BID  . arformoterol  15 mcg Nebulization BID  . bisacodyl  10 mg Oral Daily  . colchicine  0.6 mg Oral BID  . enoxaparin (LOVENOX) injection  30 mg Subcutaneous Q24H  . insulin aspart  0-20 Units Subcutaneous TID WC  . multivitamin with minerals  1 tablet Oral Daily  . pantoprazole  40 mg Oral Daily  . senna-docusate  1 tablet Oral QHS  . sertraline  50 mg Oral QHS  . sodium chloride  10-40 mL Intracatheter Q12H    Continuous Infusions: . DOPamine Stopped (06/11/14 1100)   PRN Meds:.albuterol, ALPRAZolam, fentaNYL, ondansetron (ZOFRAN) IV, oxyCODONE, potassium chloride, sodium chloride, traMADol  General appearance: alert and cooperative Neurologic: intact Heart: regular rate and rhythm, S1, S2 normal, no murmur, click, rub or gallop Lungs: diminished breath sounds bibasilar Abdomen: soft, non-tender; bowel sounds normal; no masses,  no organomegaly Extremities: extremities normal, atraumatic, no cyanosis or edema and Homans sign is negative, no sign of DVT Wound: still drainage from mt tubes leave for now  Lab Results: CBC:  Recent Labs  06/10/14 0500 06/11/14 0400  WBC 7.3 6.8  HGB 9.4* 10.0*  HCT 30.1* 33.4*  PLT 131* 159   BMET:   Recent Labs  06/10/14 0500 06/11/14 0400  NA 138 137  K 3.7 3.8  CL 99 97  CO2 34* 35*  GLUCOSE 102* 104*  BUN 14 15  CREATININE 1.36* 1.18*  CALCIUM 8.4 8.7    PT/INR: No results for input(s): LABPROT, INR in the last 72 hours. Radiology: Dg Chest Port 1 View  06/11/2014   CLINICAL DATA:  Chest tube placement  EXAM: PORTABLE CHEST - 1 VIEW  COMPARISON:  Yesterday  FINDINGS: Central venous catheter stable. Cardiomegaly stable. Right basilar consolidation worse. Increasing patchy density at the left base. Left chest tube in place. No pneumothorax.  IMPRESSION: Increasing consolidation at the right lung base. Stable left chest tube without pneumothorax.   Electronically Signed   By: Maryclare Bean M.D.   On: 06/11/2014 08:44   Dg Chest Port 1 View  06/10/2014   CLINICAL DATA:  Status post pericardial window  EXAM: PORTABLE CHEST - 1 VIEW  COMPARISON:  06/09/2014  FINDINGS: The endotracheal tube and nasogastric catheter been removed in the interval. A right jugular line remains. Drainage catheter remains over the left lung base/ pericardium. The cardiac shadow remains enlarged. Vascular congestion remains. Right basilar atelectasis is again noted.   IMPRESSION: Interval removal of nasogastric catheter and endotracheal tube. No new focal abnormality is seen.   Electronically Signed   By: Inez Catalina M.D.   On: 06/10/2014 07:54     Assessment/Plan: S/P Procedure(s) (LRB): PERICARDIAL WINDOW (N/A) VIDEO ASSISTED THORACOSCOPY (VATS) (Left) Mobilize Diuresis dopamine and foley orders per CCM Leave mt tubes until dry still 290 from tubes - will not remove today    [name] B 06/11/2014 11:15 AM

## 2014-06-12 ENCOUNTER — Inpatient Hospital Stay (HOSPITAL_COMMUNITY): Payer: Medicare Other

## 2014-06-12 LAB — GLUCOSE, CAPILLARY
GLUCOSE-CAPILLARY: 109 mg/dL — AB (ref 70–99)
GLUCOSE-CAPILLARY: 116 mg/dL — AB (ref 70–99)
GLUCOSE-CAPILLARY: 187 mg/dL — AB (ref 70–99)
GLUCOSE-CAPILLARY: 93 mg/dL (ref 70–99)
Glucose-Capillary: 74 mg/dL (ref 70–99)

## 2014-06-12 LAB — BASIC METABOLIC PANEL
ANION GAP: 7 (ref 5–15)
BUN: 16 mg/dL (ref 6–23)
CO2: 34 mmol/L — ABNORMAL HIGH (ref 19–32)
CREATININE: 1.37 mg/dL — AB (ref 0.50–1.10)
Calcium: 8.8 mg/dL (ref 8.4–10.5)
Chloride: 96 mEq/L (ref 96–112)
GFR, EST AFRICAN AMERICAN: 45 mL/min — AB (ref >90)
GFR, EST NON AFRICAN AMERICAN: 39 mL/min — AB (ref >90)
Glucose, Bld: 108 mg/dL — ABNORMAL HIGH (ref 70–99)
Potassium: 4.1 mmol/L (ref 3.5–5.1)
Sodium: 137 mmol/L (ref 135–145)

## 2014-06-12 LAB — CBC
HCT: 34.1 % — ABNORMAL LOW (ref 36.0–46.0)
HEMOGLOBIN: 9.9 g/dL — AB (ref 12.0–15.0)
MCH: 27 pg (ref 26.0–34.0)
MCHC: 29 g/dL — AB (ref 30.0–36.0)
MCV: 93.2 fL (ref 78.0–100.0)
PLATELETS: 195 10*3/uL (ref 150–400)
RBC: 3.66 MIL/uL — ABNORMAL LOW (ref 3.87–5.11)
RDW: 17.6 % — AB (ref 11.5–15.5)
WBC: 6.6 10*3/uL (ref 4.0–10.5)

## 2014-06-12 MED ORDER — FUROSEMIDE 10 MG/ML IJ SOLN
40.0000 mg | Freq: Once | INTRAMUSCULAR | Status: AC
  Administered 2014-06-12: 40 mg via INTRAVENOUS
  Filled 2014-06-12: qty 4

## 2014-06-12 NOTE — Progress Notes (Signed)
PULMONARY / CRITICAL CARE MEDICINE   Name: VOLA BENEKE MRN: [id number] DOB: [birth year]    ADMISSION DATE:  06/07/2014 CONSULTATION DATE:  06/07/2014  REFERRING MD :  Dr. [name]  CHIEF COMPLAINT:  Lower extremity edema  INITIAL PRESENTATION: 68 year old female with recent admissions for pericardial effusions and recent pericardial window, presented to [place] office 12/22 for f/u OV. She was noted to have severe lower extremity edema. Echo was ordered and showed a recurrence of effusion and early tamponade physiology. She was admitted to their service. PCCM to consult.    STUDIES:  Spirometry Nov 2012: Fev1 0.85L/45% 2D Echo 12/22 >  Mod LVH, LVEF 55-60%, Grade 1 DD. PA peak pressure 75 mmHg. Large pericardial effusion, c/w early tamponade.   SIGNIFICANT EVENTS: 12/23 Pericardial window 12/24 Passed SBT and extubated  SUBJECTIVE: Off dopamine Afebrile Denies pain oob to chair,diuresed with lasix   VITAL SIGNS: Temp:  [97.5 F (36.4 C)-98.4 F (36.9 C)] 98.4 F (36.9 C) (12/27 0700) Pulse Rate:  [85-108] 108 (12/27 0700) Resp:  [17-31] 23 (12/27 0700) BP: (90-117)/(48-63) 102/51 mmHg (12/27 0700) SpO2:  [91 %-100 %] 92 % (12/27 0700) Weight:  [81.1 kg (178 lb 12.7 oz)] 81.1 kg (178 lb 12.7 oz) (12/27 0600) HEMODYNAMICS:   VENTILATOR SETTINGS:   INTAKE / OUTPUT:  Intake/Output Summary (Last 24 hours) at 06/12/14 0749 Last data filed at 06/12/14 0600  Gross per 24 hour  Intake  603.9 ml  Output   1505 ml  Net -901.1 ml    PHYSICAL EXAMINATION: General: NAD HEENT: WNL PULM: diminished, no wheezing CV: Reg, no M AB: BS+, soft, nontender Ext: warm, 1+ edema Neuro A&Ox4, anxious   LABS:  CBC  Recent Labs Lab 06/10/14 0500 06/11/14 0400 06/12/14 0400  WBC 7.3 6.8 6.6  HGB 9.4* 10.0* 9.9*  HCT 30.1* 33.4* 34.1*  PLT 131* 159 195   Coag's  Recent Labs Lab 06/07/14 2000  INR 0.93   BMET  Recent Labs Lab 06/10/14 0500 06/11/14 0400  06/12/14 0400  NA 138 137 137  K 3.7 3.8 4.1  CL 99 97 96  CO2 34* 35* 34*  BUN 14 15 16   CREATININE 1.36* 1.18* 1.37*  GLUCOSE 102* 104* 108*   Electrolytes  Recent Labs Lab 06/10/14 0500 06/11/14 0400 06/12/14 0400  CALCIUM 8.4 8.7 8.8   Sepsis Markers No results for input(s): LATICACIDVEN, PROCALCITON, O2SATVEN in the last 168 hours. ABG  Recent Labs Lab 06/08/14 1853 06/09/14 0557  PHART 7.315* 7.422  PCO2ART 67.7* 53.5*  PO2ART 58.0* 60.0*   Liver Enzymes  Recent Labs Lab 06/10/14 0500  AST 15  ALT 22  ALKPHOS 51  BILITOT 0.2*  ALBUMIN 2.4*   Cardiac Enzymes No results for input(s): TROPONINI, PROBNP in the last 168 hours. Glucose  Recent Labs Lab 06/11/14 0315 06/11/14 0735 06/11/14 1110 06/11/14 1521 06/11/14 1919 06/11/14 2349  GLUCAP 100* 127* 98 164* 95 93    Imaging Dg Chest Port 1 View  06/11/2014   CLINICAL DATA:  Chest tube placement  EXAM: PORTABLE CHEST - 1 VIEW  COMPARISON:  Yesterday  FINDINGS: Central venous catheter stable. Cardiomegaly stable. Right basilar consolidation worse. Increasing patchy density at the left base. Left chest tube in place. No pneumothorax.  IMPRESSION: Increasing consolidation at the right lung base. Stable left chest tube without pneumothorax.   Electronically Signed   By: Maryclare Bean M.D.   On: 06/11/2014 08:44     ASSESSMENT / PLAN:  PULMONARY A: Chronic respiratory failure  Severe COPD without acute exacerbation (on 2L at home) RLL infiltrate vs ATX Mild pulmonary edema P:   Continue supplemental O2 to maintain SpO2 > 90% Continue preadmission brovana, budesonide PRN Duoneb NO re-intubation   CARDIOVASCULAR A:  Recurrent pericardial effusion, unclear etiology HTN, controlled  P:  Post op managment per cardiology/TCTS - ?dc chest tube Hold anti-hypertensive meds   RENAL A:   AKI -improving UO  P:   Monitor BMET intermittently Monitor I/Os Correct electrolytes as  indicated Lasix 40 x 1  GASTROINTESTINAL A:   GERD P:   SUP: PO protonix Advance diet when able  HEMATOLOGIC A:   Anemia without overt bleedin P:  DVT px: SQ heparin Monitor CBC intermittently Transfuse per usual ICU guidelines  INFECTIOUS A:   No acute issues P:   Monitor off ABX   ENDOCRINE A:  DM 2 P:   CBG monitoring SSI   Summary - improving, can transfer now that off dopamine, diurese    Rigoberto Noel. MD

## 2014-06-12 NOTE — Progress Notes (Signed)
Patient ID: [name], female   DOB: 20-Apr-1946, 68 y.o.   MRN: [id number] TCTS DAILY ICU PROGRESS NOTE                   [address]            [place] [address]          [id number]   4 Days Post-Op Procedure(s) (LRB): PERICARDIAL WINDOW (N/A) VIDEO ASSISTED THORACOSCOPY (VATS) (Left)  Total Length of Stay:  LOS: 5 days   Subjective:   up in chair , less  sob this am  Objective: Vital signs in last 24 hours: Temp:  [97.5 F (36.4 C)-98.4 F (36.9 C)] 98.4 F (36.9 C) (12/27 0700) Pulse Rate:  [85-108] 101 (12/27 0900) Cardiac Rhythm:  [-] Normal sinus rhythm (12/27 0720) Resp:  [17-31] 22 (12/27 0900) BP: (90-130)/(48-66) 130/66 mmHg (12/27 0900) SpO2:  [91 %-100 %] 96 % (12/27 0900) Weight:  [178 lb 12.7 oz (81.1 kg)] 178 lb 12.7 oz (81.1 kg) (12/27 0600)  Filed Weights   06/10/14 0600 06/11/14 0600 06/12/14 0600  Weight: 179 lb 4.8 oz (81.33 kg) 180 lb 12.4 oz (82 kg) 178 lb 12.7 oz (81.1 kg)    Weight change: -1 lb 15.8 oz (-0.9 kg)   Hemodynamic parameters for last 24 hours:    Intake/Output from previous day: 12/26 0701 - 12/27 0700 In: 603.9 [P.O.:500; I.V.:103.9] Out: 1505 [Urine:1165; Chest Tube:340]  Intake/Output this shift: Total I/O In: 120 [P.O.:120] Out: -   Current Meds: Scheduled Meds: . acetaminophen  1,000 mg Oral 4 times per day   Or  . acetaminophen (TYLENOL) oral liquid 160 mg/5 mL  1,000 mg Oral 4 times per day  . antiseptic oral rinse  7 mL Mouth Rinse BID  . arformoterol  15 mcg Nebulization BID  . bisacodyl  10 mg Oral Daily  . colchicine  0.6 mg Oral BID  . enoxaparin (LOVENOX) injection  30 mg Subcutaneous Q24H  . insulin aspart  0-20 Units Subcutaneous TID WC  . multivitamin with minerals  1 tablet Oral Daily  . pantoprazole  40 mg Oral Daily  . senna-docusate  1 tablet Oral QHS  . sertraline  50 mg Oral QHS  . sodium chloride  10-40 mL Intracatheter Q12H   Continuous Infusions: . DOPamine  Stopped (06/11/14 1100)   PRN Meds:.albuterol, ALPRAZolam, fentaNYL, ondansetron (ZOFRAN) IV, oxyCODONE, potassium chloride, sodium chloride, traMADol  General appearance: alert and cooperative Neurologic: intact Heart: regular rate and rhythm, S1, S2 normal, no murmur, click, rub or gallop Lungs: diminished breath sounds bibasilar Abdomen: soft, non-tender; bowel sounds normal; no masses,  no organomegaly Extremities: extremities normal, atraumatic, no cyanosis or edema and Homans sign is negative, no sign of DVT Wound: still drainage from mt tubes leave for now, 390 from  Tube , smaller tube removed yesterday  Lab Results: CBC:  Recent Labs  06/11/14 0400 06/12/14 0400  WBC 6.8 6.6  HGB 10.0* 9.9*  HCT 33.4* 34.1*  PLT 159 195   BMET:   Recent Labs  06/11/14 0400 06/12/14 0400  NA 137 137  K 3.8 4.1  CL 97 96  CO2 35* 34*  GLUCOSE 104* 108*  BUN 15 16  CREATININE 1.18* 1.37*  CALCIUM 8.7 8.8    PT/INR: No results for input(s): LABPROT, INR in the last 72 hours. Radiology: Dg Chest Port 1 View  06/11/2014   CLINICAL DATA:  Chest tube placement  EXAM:  PORTABLE CHEST - 1 VIEW  COMPARISON:  Yesterday  FINDINGS: Central venous catheter stable. Cardiomegaly stable. Right basilar consolidation worse. Increasing patchy density at the left base. Left chest tube in place. No pneumothorax.  IMPRESSION: Increasing consolidation at the right lung base. Stable left chest tube without pneumothorax.   Electronically Signed   By: Maryclare Bean M.D.   On: 06/11/2014 08:44     Assessment/Plan: S/P Procedure(s) (LRB): PERICARDIAL WINDOW (N/A) VIDEO ASSISTED THORACOSCOPY (VATS) (Left) Mobilize Diuresis dopamine and foley orders per CCM Leave mt tubes until dry still >300  from tube - will not remove today    [name] B 06/12/2014 9:32 AM

## 2014-06-13 ENCOUNTER — Other Ambulatory Visit: Payer: Self-pay

## 2014-06-13 ENCOUNTER — Inpatient Hospital Stay (HOSPITAL_COMMUNITY): Payer: Medicare Other

## 2014-06-13 LAB — GLUCOSE, CAPILLARY
GLUCOSE-CAPILLARY: 116 mg/dL — AB (ref 70–99)
GLUCOSE-CAPILLARY: 128 mg/dL — AB (ref 70–99)
GLUCOSE-CAPILLARY: 138 mg/dL — AB (ref 70–99)
Glucose-Capillary: 123 mg/dL — ABNORMAL HIGH (ref 70–99)
Glucose-Capillary: 127 mg/dL — ABNORMAL HIGH (ref 70–99)
Glucose-Capillary: 109 mg/dL — ABNORMAL HIGH (ref 70–99)

## 2014-06-13 LAB — BASIC METABOLIC PANEL
ANION GAP: 7 (ref 5–15)
BUN: 18 mg/dL (ref 6–23)
CALCIUM: 8.9 mg/dL (ref 8.4–10.5)
CO2: 33 mmol/L — ABNORMAL HIGH (ref 19–32)
Chloride: 95 mEq/L — ABNORMAL LOW (ref 96–112)
Creatinine, Ser: 1.48 mg/dL — ABNORMAL HIGH (ref 0.50–1.10)
GFR calc non Af Amer: 35 mL/min — ABNORMAL LOW (ref >90)
GFR, EST AFRICAN AMERICAN: 41 mL/min — AB (ref >90)
Glucose, Bld: 116 mg/dL — ABNORMAL HIGH (ref 70–99)
Potassium: 4.5 mmol/L (ref 3.5–5.1)
Sodium: 135 mmol/L (ref 135–145)

## 2014-06-13 MED ORDER — FUROSEMIDE 10 MG/ML IJ SOLN
20.0000 mg | Freq: Once | INTRAMUSCULAR | Status: AC
Start: 2014-06-13 — End: 2014-06-13
  Administered 2014-06-13: 20 mg via INTRAVENOUS
  Filled 2014-06-13: qty 2

## 2014-06-13 MED ORDER — IPRATROPIUM-ALBUTEROL 0.5-2.5 (3) MG/3ML IN SOLN
3.0000 mL | Freq: Four times a day (QID) | RESPIRATORY_TRACT | Status: DC
  Administered 2014-06-13 – 2014-06-18 (×18): 3 mL via RESPIRATORY_TRACT
  Filled 2014-06-13 (×19): qty 3

## 2014-06-13 MED ORDER — BUDESONIDE 0.25 MG/2ML IN SUSP
0.5000 mg | Freq: Two times a day (BID) | RESPIRATORY_TRACT | Status: DC
  Administered 2014-06-13: 0.5 mg via RESPIRATORY_TRACT
  Filled 2014-06-13 (×4): qty 4

## 2014-06-13 MED ORDER — FUROSEMIDE 40 MG PO TABS
40.0000 mg | ORAL_TABLET | Freq: Every day | ORAL | Status: DC
  Administered 2014-06-13: 40 mg via ORAL
  Filled 2014-06-13 (×2): qty 1

## 2014-06-13 NOTE — Progress Notes (Addendum)
PCCM Interval Progress Note  Called by Warren Lacy MD and asked to assess pt at bedside.  68 y.o. F admitted 12/22 as direct admit by TCTS after she presented for outpatient follow up visit and found to have severe lower extremity edema.  ECHO showed recurrent of pleural effusion with early tamponade physiology.  She had pericardial window 12/23, extubated 12/24.  On evening of 12/28, she had worsening SOB and hypoxia (SpO2 down to low 80's despite 2L O2).  SOB improved after albuterol treatment and SpO2 up to 95%; however, pt still in obvious respiratory distress.  Note, pt is DNR Status.   VITAL SIGNS: Temp:  [97.6 F (36.4 C)-97.8 F (36.6 C)] 97.8 F (36.6 C) (12/28 1354) Pulse Rate:  [91-130] 101 (12/28 1846) Resp:  [16-20] 20 (12/28 1839) BP: (120-146)/(60-85) 144/67 mmHg (12/28 1846) SpO2:  [81 %-99 %] 95 % (12/28 1846)  PHYSICAL EXAM: General:  Elderly chronically ill appearing female, in respiratory distress. Neuro:  A&O x 3, follows commands. Cardiovascular:  Tachy, regular. Lungs:  Diminished throughout, faint expiratory wheeze.  Tripod position with accessory muscle use.  Left sided chest tube in place, no air leak. Abdomen:  Soft, NT/ND.  Musculoskeletal:  1+ edema.  CXR 12/28:  CHF, b/l pleural effusions, edema.  IMPRESSION / PLAN:  Chronic hypoxic respiratory failure Severe COPD Pulmonary edema Bilateral pleural effusions DNR / DNI Status Recs: Transfer to SDU for continuous BiPAP. 20mg  Lasix once. Add back scheduled DuoNebs, Budesonide. Continue scheduled Brovana, PRN albuterol. No intubation given DNR status.  CC Time:  30 minutes.  Montey Hora, [place] Pgr: [phone]  or [phone] 06/13/2014, 6:55 PM      Agree with above,. Also see my management note frmo same day  Lavon Paganini. Titus Mould, MD, [place] Pgr: [facility]

## 2014-06-13 NOTE — Progress Notes (Addendum)
Upon arrival to [facility] pt sitting up in bed breathing labored but alert and oriented. Pt now on BiPAP and only responsive to painful stimuli. Called [facility] and updated them on pts status. Tried calling son, daughter, and sister multiple times with no answer. Will continue to monitor.

## 2014-06-13 NOTE — Progress Notes (Signed)
Received patient from [facility].  Patient placed on BIPAP 12/6 50%. Patient tolerating well at this time.

## 2014-06-13 NOTE — Progress Notes (Signed)
Spoke with and updated pts son and daughter on pts current status. Still only responsive to painful stimuli. Will continue to monitor.

## 2014-06-13 NOTE — Significant Event (Signed)
[facility] Event Note  Overview: Time Called: 1829 Arrival Time: 1830    Initial Focused Assessment: Patient in acute respiratory distress, using accessory muscles.  Lung sounds diminished through out. Initially O2 sats decreased to 81% BP 144/63 ST 105  RR 32  O2 sat 92% on 2L   Interventions: Albuterol treatment given PCXR done Plan Bipap post transfer Transfer to 2h20 via bed with heart monitor and o2 RN to notify family of patient status.   Event Summary: Name of Physician Notified: CCM/ Rahul Desai at [facility]    at    Outcome: Transferred (Comment) (2h20)     Raliegh Ip

## 2014-06-13 NOTE — Progress Notes (Signed)
PULMONARY / CRITICAL CARE MEDICINE   Name: [name] MRN: [id number] DOB: [birth year]    ADMISSION DATE:  06/07/2014 CONSULTATION DATE:  06/07/2014  REFERRING MD :  Dr. [name]  CHIEF COMPLAINT:  Lower extremity edema  INITIAL PRESENTATION: 68 year old female with recent admissions for pericardial effusions and recent pericardial window, presented to [place] office 12/22 for f/u OV. She was noted to have severe lower extremity edema. Echo was ordered and showed a recurrence of effusion and early tamponade physiology. She was admitted to their service. PCCM to consult.    STUDIES:  Spirometry Nov 2012: Fev1 0.85L/45% 2D Echo 12/22 >  Mod LVH, LVEF 55-60%, Grade 1 DD. PA peak pressure 75 mmHg. Large pericardial effusion, c/w early tamponade.   SIGNIFICANT EVENTS: 12/23 Pericardial window 12/24 Passed SBT and extubated  SUBJECTIVE:  Afebrile Denies pain Breathing 'ok'   VITAL SIGNS: Temp:  [97.6 F (36.4 C)-97.7 F (36.5 C)] 97.6 F (36.4 C) (12/28 0448) Pulse Rate:  [91-130] 91 (12/28 0448) Resp:  [16-18] 18 (12/28 0448) BP: (120-131)/(60-85) 120/60 mmHg (12/28 0448) SpO2:  [96 %-99 %] 98 % (12/28 0827) HEMODYNAMICS:   VENTILATOR SETTINGS:   INTAKE / OUTPUT:  Intake/Output Summary (Last 24 hours) at 06/13/14 1253 Last data filed at 06/12/14 1700  Gross per 24 hour  Intake    120 ml  Output      0 ml  Net    120 ml    PHYSICAL EXAMINATION: General: NAD HEENT: WNL PULM: diminished, no wheezing CV: Reg, no M AB: BS+, soft, nontender Ext: warm, 1+ edema Neuro A&Ox4, anxious   LABS:  CBC  Recent Labs Lab 06/10/14 0500 06/11/14 0400 06/12/14 0400  WBC 7.3 6.8 6.6  HGB 9.4* 10.0* 9.9*  HCT 30.1* 33.4* 34.1*  PLT 131* 159 195   Coag's  Recent Labs Lab 06/07/14 2000  INR 0.93   BMET  Recent Labs Lab 06/11/14 0400 06/12/14 0400 06/13/14 0349  NA 137 137 135  K 3.8 4.1 4.5  CL 97 96 95*  CO2 35* 34* 33*  BUN 15 16 18    CREATININE 1.18* 1.37* 1.48*  GLUCOSE 104* 108* 116*   Electrolytes  Recent Labs Lab 06/11/14 0400 06/12/14 0400 06/13/14 0349  CALCIUM 8.7 8.8 8.9   Sepsis Markers No results for input(s): LATICACIDVEN, PROCALCITON, O2SATVEN in the last 168 hours. ABG  Recent Labs Lab 06/08/14 1853 06/09/14 0557  PHART 7.315* 7.422  PCO2ART 67.7* 53.5*  PO2ART 58.0* 60.0*   Liver Enzymes  Recent Labs Lab 06/10/14 0500  AST 15  ALT 22  ALKPHOS 51  BILITOT 0.2*  ALBUMIN 2.4*   Cardiac Enzymes No results for input(s): TROPONINI, PROBNP in the last 168 hours. Glucose  Recent Labs Lab 06/12/14 0736 06/12/14 1111 06/12/14 1740 06/12/14 2148 06/13/14 0630 06/13/14 1113  GLUCAP 109* 187* 74 116* 123* 128*    Imaging Dg Chest Port 1 View  06/12/2014   CLINICAL DATA:  Pericardial effusion  EXAM: PORTABLE CHEST - 1 VIEW  COMPARISON:  06/11/2014  FINDINGS: Cardiomegaly again noted. Stable right IJ central line position. No pulmonary edema. Persistent left basilar atelectasis or infiltrate. There is small right pleural effusion with right basilar atelectasis or infiltrate. No pneumothorax.  IMPRESSION: Small right pleural effusion with right basilar atelectasis or infiltrate. Stable left basilar atelectasis or infiltrate. No pulmonary edema. Cardiomegaly again noted.   Electronically Signed   By: Lahoma Crocker M.D.   On: 06/12/2014 11:01  ASSESSMENT / PLAN:  PULMONARY A: Chronic respiratory failure  Severe COPD without acute exacerbation (on 2L at home) RLL infiltrate vs ATX Mild pulmonary edema P:   Continue supplemental O2 to maintain SpO2 > 90% Continue preadmission brovana, budesonide PRN Duoneb NO re-intubation dc chest tube once drainage decreases  -hopw soon   CARDIOVASCULAR A:  Recurrent pericardial effusion, unclear etiology HTN, controlled  P:  Post op managment per cardiology/TCTS -  Hold anti-hypertensive meds   RENAL A:   AKI -improving UO  P:    Monitor BMET intermittently Monitor I/Os Correct electrolytes as indicated Lasix 40 PO daily  GASTROINTESTINAL A:   GERD P:   SUP: PO protonix Advance diet when able  HEMATOLOGIC A:   Anemia without overt bleedin P:  DVT px: SQ heparin Monitor CBC intermittently Transfuse per usual ICU guidelines  INFECTIOUS A:   No acute issues P:   Monitor off ABX   ENDOCRINE A:  DM 2 P:   CBG monitoring SSI   Summary - improving, await chest tube removal, eventually back to SNF    [facility]. MD

## 2014-06-13 NOTE — Progress Notes (Signed)
Multiple attempts made to call pt's son, [name]; however, number listed is not working.  Did manage to get hold of pt's sister, [name]. [name] updated regarding pt's condition, informed that pt is now requiring BiPAP and that prognosis is guarded.  [name] stated she would notify pt's nephew who was going to call me back; however, I have not yet heard from him.   Montey Hora, [facility] Pgr: [phone]  or [phone] 06/13/2014, 9:20 PM

## 2014-06-13 NOTE — Progress Notes (Signed)
Called by RN for prn treatment for patient. Upon arrival pt had increased WOB, use of accessory muscles. SPO2 81% on 3 LPM nasal cannula. BBS diminished in all lobes. Albuterol breathing treatment given. SPO2 increased to 97% and was able to decreased to 2 LPM nasal cannula. Pt still has increased WOB and in tripod position. [facility]. RN and MD at bedside. Pt will transfered to step down and be placed on BIPAP per MD.

## 2014-06-13 NOTE — Progress Notes (Signed)
Called patient's son Lexany Belknap and updated on pt status.  Son aware that mother to transfer to [facility] rm#[id number]. [facility] to transport pt.  Report given RN Chrys Racer. Payton Emerald, RN

## 2014-06-13 NOTE — Progress Notes (Signed)
PT Cancellation Note  Patient Details Name: [name] MRN: [id number] DOB: 1946/01/09   Cancelled Treatment:    Reason Eval/Treat Not Completed: Patient declined, no reason specified;Patient's level of consciousness;Fatigue/lethargy limiting ability to participate. Pt sitting EOB asleep; pt repositioned in bed in supine position for safety. Pt very lethargic and confused. RN made aware.    Elie Confer Sayre, [place] 06/13/2014, 10:37 AM

## 2014-06-13 NOTE — Clinical Social Work Note (Signed)
Received phone call from [place] to get an update on status of patient's return.  Patient still has a chest tube and is not ready for discharge yet.  CSW will continue to follow patient as she progresses.  Jones Broom. [place], MSW, [place] 06/13/2014 4:51 PM

## 2014-06-13 NOTE — Progress Notes (Addendum)
[address]       [place] [address]             [phone]      5 Days Post-Op Procedure(s) (LRB): PERICARDIAL WINDOW (N/A) VIDEO ASSISTED THORACOSCOPY (VATS) (Left) Subjective: Feels ok, denies SOB , not having pain  Objective: Vital signs in last 24 hours: Temp:  [97.6 F (36.4 C)-98.2 F (36.8 C)] 97.6 F (36.4 C) (12/28 0448) Pulse Rate:  [91-130] 91 (12/28 0448) Cardiac Rhythm:  [-] Normal sinus rhythm (12/28 0102) Resp:  [16-25] 18 (12/28 0448) BP: (120-141)/(60-85) 120/60 mmHg (12/28 0448) SpO2:  [95 %-99 %] 99 % (12/28 0448)  Hemodynamic parameters for last 24 hours:    Intake/Output from previous day: 12/27 0701 - 12/28 0700 In: 240 [P.O.:240] Out: 151 [Urine:150; Stool:1] Intake/Output this shift:    General appearance: alert, cooperative and no distress Heart: regular rate and rhythm and occas extrasystole Lungs: fair air exchange Abdomen: benign Extremities: + LE edema Wound: incis healing well  Lab Results:  Recent Labs  06/11/14 0400 06/12/14 0400  WBC 6.8 6.6  HGB 10.0* 9.9*  HCT 33.4* 34.1*  PLT 159 195   BMET:  Recent Labs  06/12/14 0400 06/13/14 0349  NA 137 135  K 4.1 4.5  CL 96 95*  CO2 34* 33*  GLUCOSE 108* 116*  BUN 16 18  CREATININE 1.37* 1.48*  CALCIUM 8.8 8.9    PT/INR: No results for input(s): LABPROT, INR in the last 72 hours. ABG    Component Value Date/Time   PHART 7.422 06/09/2014 0557   HCO3 34.8* 06/09/2014 0557   TCO2 36 06/09/2014 0557   O2SAT 90.0 06/09/2014 0557   CBG (last 3)   Recent Labs  06/12/14 1740 06/12/14 2148 06/13/14 0630  GLUCAP 74 116* 123*    Meds Scheduled Meds: . acetaminophen  1,000 mg Oral 4 times per day   Or  . acetaminophen (TYLENOL) oral liquid 160 mg/5 mL  1,000 mg Oral 4 times per day  . antiseptic oral rinse  7 mL Mouth Rinse BID  . arformoterol  15 mcg Nebulization BID  . bisacodyl  10 mg Oral Daily  . colchicine  0.6 mg Oral BID  .  enoxaparin (LOVENOX) injection  30 mg Subcutaneous Q24H  . insulin aspart  0-20 Units Subcutaneous TID WC  . multivitamin with minerals  1 tablet Oral Daily  . pantoprazole  40 mg Oral Daily  . senna-docusate  1 tablet Oral QHS  . sertraline  50 mg Oral QHS  . sodium chloride  10-40 mL Intracatheter Q12H   Continuous Infusions:  PRN Meds:.albuterol, ALPRAZolam, fentaNYL, ondansetron (ZOFRAN) IV, oxyCODONE, potassium chloride, sodium chloride, traMADol  Xrays Dg Chest Port 1 View  06/12/2014   CLINICAL DATA:  Pericardial effusion  EXAM: PORTABLE CHEST - 1 VIEW  COMPARISON:  06/11/2014  FINDINGS: Cardiomegaly again noted. Stable right IJ central line position. No pulmonary edema. Persistent left basilar atelectasis or infiltrate. There is small right pleural effusion with right basilar atelectasis or infiltrate. No pneumothorax.  IMPRESSION: Small right pleural effusion with right basilar atelectasis or infiltrate. Stable left basilar atelectasis or infiltrate. No pulmonary edema. Cardiomegaly again noted.   Electronically Signed   By: Lahoma Crocker M.D.   On: 06/12/2014 11:01    Assessment/Plan: S/P Procedure(s) (LRB): PERICARDIAL WINDOW (N/A) VIDEO ASSISTED THORACOSCOPY (VATS) (Left)  1 doing ok 2 150 cc out of chest tube yesterday, will keep in place  3 other plans as per pulmonary service    LOS: 6 days    GOLD,WAYNE E 06/13/2014  I have seen and examined the patient and agree with the assessment and plan as outlined.  OWEN,CLARENCE H 06/13/2014 9:06 AM

## 2014-06-14 ENCOUNTER — Inpatient Hospital Stay (HOSPITAL_COMMUNITY): Payer: Medicare Other

## 2014-06-14 DIAGNOSIS — Z9689 Presence of other specified functional implants: Secondary | ICD-10-CM | POA: Insufficient documentation

## 2014-06-14 DIAGNOSIS — Z789 Other specified health status: Secondary | ICD-10-CM

## 2014-06-14 DIAGNOSIS — J410 Simple chronic bronchitis: Secondary | ICD-10-CM

## 2014-06-14 LAB — POCT I-STAT 3, ART BLOOD GAS (G3+)
Acid-Base Excess: 10 mmol/L — ABNORMAL HIGH (ref 0.0–2.0)
Bicarbonate: 40.6 meq/L — ABNORMAL HIGH (ref 20.0–24.0)
O2 Saturation: 91 %
Patient temperature: 98.6
TCO2: 43 mmol/L (ref 0–100)
pCO2 arterial: 89.3 mmHg (ref 35.0–45.0)
pH, Arterial: 7.265 — ABNORMAL LOW (ref 7.350–7.450)
pO2, Arterial: 74 mmHg — ABNORMAL LOW (ref 80.0–100.0)

## 2014-06-14 LAB — GLUCOSE, CAPILLARY
GLUCOSE-CAPILLARY: 84 mg/dL (ref 70–99)
GLUCOSE-CAPILLARY: 79 mg/dL (ref 70–99)
GLUCOSE-CAPILLARY: 91 mg/dL (ref 70–99)
GLUCOSE-CAPILLARY: 80 mg/dL (ref 70–99)
Glucose-Capillary: 118 mg/dL — ABNORMAL HIGH (ref 70–99)
Glucose-Capillary: 86 mg/dL (ref 70–99)

## 2014-06-14 MED ORDER — FUROSEMIDE 10 MG/ML IJ SOLN
80.0000 mg | Freq: Two times a day (BID) | INTRAMUSCULAR | Status: AC
  Administered 2014-06-14 – 2014-06-15 (×4): 80 mg via INTRAVENOUS
  Filled 2014-06-14 (×4): qty 8

## 2014-06-14 MED ORDER — BUDESONIDE 0.5 MG/2ML IN SUSP
0.5000 mg | Freq: Two times a day (BID) | RESPIRATORY_TRACT | Status: DC
  Administered 2014-06-14 – 2014-06-21 (×14): 0.5 mg via RESPIRATORY_TRACT
  Filled 2014-06-14 (×18): qty 2

## 2014-06-14 NOTE — Care Management Note (Addendum)
    Page 1 of 1   06/24/2014     10:09:12 AM CARE MANAGEMENT NOTE 06/24/2014  Patient:  [name], [name]   Account Number:  [id number]  Date Initiated:  06/08/2014  Documentation initiated by:  [facility]  Subjective/Objective Assessment:   Readmitted with pericardial effusion - tamponade.     Action/Plan:   Anticipated DC Date:  06/24/2014   Anticipated DC Plan:  SKILLED NURSING FACILITY  In-house referral  Clinical Social Worker      DC Planning Services  CM consult      Choice offered to / List presented to:             Status of service:  Completed, signed off Medicare Important Message given?  YES (If response is "NO", the following Medicare IM given date fields will be blank) Date Medicare IM given:  06/23/2014 Medicare IM given by:  Elissa Hefty Date Additional Medicare IM given:  06/20/2014 Additional Medicare IM given by:  Elissa Hefty  Discharge Disposition:  [place]  Per UR Regulation:  Reviewed for med. necessity/level of care/duration of stay  If discussed at [place] of Stay Meetings, dates discussed:   06/16/2014  06/21/2014  06/23/2014    Comments:  Contact:  Kinyon,Teryl Son [phone]      Drummond,Yvonne Sister [phone]  [phone]   Bertram Savin Daughter   [phone]  1/8 1007 debbie Victoriano Campion rn,bsn pt has decided she will go back to [facility] several more weeks for rehab before returning home. pt lives alone.have alerted donna w soc work of dc planned for today.

## 2014-06-14 NOTE — Progress Notes (Addendum)
Pt is now A&Ox4; following commands and expresses understanding that she is in the hospital. Will continue to monitor.

## 2014-06-14 NOTE — Progress Notes (Signed)
Pt's son called by RN and updated on status. Per son's request, RN removed pt's sister's contact information from chart.

## 2014-06-14 NOTE — Progress Notes (Addendum)
TCTS DAILY ICU PROGRESS NOTE                   [address]            [place] [address]          [phone]   6 Days Post-Op Procedure(s) (LRB): PERICARDIAL WINDOW (N/A) VIDEO ASSISTED THORACOSCOPY (VATS) (Left)  Total Length of Stay:  LOS: 7 days   Subjective: Pt now in ICU on BiPap.  Not following commands or opening eyes.   Objective: Vital signs in last 24 hours: Temp:  [97.3 F (36.3 C)-98.5 F (36.9 C)] 98.5 F (36.9 C) (12/29 0750) Pulse Rate:  [96-105] 100 (12/29 0759) Cardiac Rhythm:  [-] Normal sinus rhythm (12/29 0400) Resp:  [18-41] 33 (12/29 0759) BP: (106-173)/(54-82) 128/71 mmHg (12/29 0700) SpO2:  [81 %-100 %] 94 % (12/29 0759) FiO2 (%):  [50 %] 50 % (12/29 0759)  Filed Weights   06/10/14 0600 06/11/14 0600 06/12/14 0600  Weight: 179 lb 4.8 oz (81.33 kg) 180 lb 12.4 oz (82 kg) 178 lb 12.7 oz (81.1 kg)    Weight change:       Intake/Output from previous day: 12/28 0701 - 12/29 0700 In: 50 [P.O.:40; I.V.:10] Out: 20 [Chest Tube:20]  Intake/Output this shift:    Current Meds: Scheduled Meds: . antiseptic oral rinse  7 mL Mouth Rinse BID  . arformoterol  15 mcg Nebulization BID  . bisacodyl  10 mg Oral Daily  . budesonide (PULMICORT) nebulizer solution  0.5 mg Nebulization BID  . colchicine  0.6 mg Oral BID  . enoxaparin (LOVENOX) injection  30 mg Subcutaneous Q24H  . furosemide  40 mg Oral Daily  . insulin aspart  0-20 Units Subcutaneous TID WC  . ipratropium-albuterol  3 mL Nebulization Q6H  . multivitamin with minerals  1 tablet Oral Daily  . pantoprazole  40 mg Oral Daily  . senna-docusate  1 tablet Oral QHS  . sertraline  50 mg Oral QHS  . sodium chloride  10-40 mL Intracatheter Q12H   Continuous Infusions:  PRN Meds:.albuterol, ALPRAZolam, fentaNYL, ondansetron (ZOFRAN) IV, oxyCODONE, potassium chloride, sodium chloride, traMADol   Physical Exam: General appearance: Grimaces to painful stimuli, otherwise not  following commands or responsive Heart: Mildly tachy, low 100s Lungs: Very diminished BS bilaterally with exp wheezing Wound: Clean and dry Chest tube: No air leak  Lab Results: CBC: Recent Labs  06/12/14 0400  WBC 6.6  HGB 9.9*  HCT 34.1*  PLT 195   BMET:  Recent Labs  06/12/14 0400 06/13/14 0349  NA 137 135  K 4.1 4.5  CL 96 95*  CO2 34* 33*  GLUCOSE 108* 116*  BUN 16 18  CREATININE 1.37* 1.48*  CALCIUM 8.8 8.9    PT/INR: No results for input(s): LABPROT, INR in the last 72 hours. Radiology: Dg Chest Port 1 View  06/13/2014   CLINICAL DATA:  Hypoxia  EXAM: PORTABLE CHEST - 1 VIEW  COMPARISON:  June 12, 2014  FINDINGS: Central catheter tip is in the superior vena cava near the cavoatrial junction. There are bilateral effusions with bibasilar consolidation. There is edema with cardiomegaly and pulmonary venous hypertension. No pneumothorax. No adenopathy. No bone lesions.  IMPRESSION: Evidence of congestive heart failure. Question superimposed pneumonia in the bases. Central catheter tip in superior vena cava near the cavoatrial junction. No pneumothorax.   Electronically Signed   By: Lowella Grip M.D.   On: 06/13/2014 19:14  Assessment/Plan: S/P Procedure(s) (LRB): PERICARDIAL WINDOW (N/A) VIDEO ASSISTED THORACOSCOPY (VATS) (Left) Pt developed acute respiratory decompensation last night and transferred to ICU for further management. She is on continuous BiPap.  Pulm/CCM managing.   She is a DNR.  From thoracic standpoint, her chest tube output is minimal and there does not appear to be an air leak.  However, based on her present condition, will continue chest tube to suction for now.  COLLINS,GINA H 06/14/2014 8:26 AM  Chart reviewed, patient examined, agree with above. She is unresponsive this am with marked hypercapnia on ABG. Chest tube output is minimal. I would leave it in for now with her marginal respiratory status. She is DNI.

## 2014-06-14 NOTE — Progress Notes (Signed)
Pt is alert and oriented x3. No signs of respiratory distress. RN and RT were going to attempt to place pt on [place] from [place]; however pt refused and states she is not ready. Will continue to monitor and attempt again at later time

## 2014-06-14 NOTE — Progress Notes (Signed)
Physical Therapy Treatment Patient Details Name: [name] MRN: [id number] DOB: 1946-02-10 Today's Date: 06/14/2014    History of Present Illness 68 year old female with recent admissions for pericardial effusions and recent pericardial window, presented to [facility] office 12/22 for f/u OV. She was noted to have severe lower extremity edema. Echo was ordered and showed a recurrence of effusion and early tamponade physiology. S/p pericardial window 12/23, extubated 12/24.    PT Comments    Patient seen for therapy this session, sat EOB for several minutes, VSS monitored during session.  Stable, patient then performed standing with assist and UE support. Pericare and hygiene performed while in standing position. Patient tolerated well. Encouraged patient to perform LE there-ex however patient tearful and not wanting to perform.  Patient assisted back to bed and repositioned, VSS on bipap. Performed some cognitive tasks during session, patient appropriate and alert with responses. Nsg aware. Will continue to see as indicated and progress as tolerated.    Follow Up Recommendations  SNF;Supervision for mobility/OOB     Equipment Recommendations  None recommended by PT    Recommendations for Other Services OT consult     Precautions / Restrictions Precautions Precautions: Fall Restrictions Weight Bearing Restrictions: No    Mobility  Bed Mobility Overal bed mobility: Needs Assistance Bed Mobility: Supine to Sit;Sit to Supine     Supine to sit: Mod assist Sit to supine: Mod assist   General bed mobility comments: Assist for mobility, appears that patient could perform without assist but requests assists for therapist for mobility.    Transfers Overall transfer level: Needs assistance Equipment used: Rolling walker (2 wheeled) Transfers: Sit to/from Stand Sit to Stand: Min guard         General transfer comment: patient urinated during static standing, assisted with  hygiene  Ambulation/Gait                 Stairs            Wheelchair Mobility    Modified Rankin (Stroke Patients Only)       Balance     Sitting balance-Leahy Scale: Fair     Standing balance support: Bilateral upper extremity supported;During functional activity Standing balance-Leahy Scale: Poor Standing balance comment: stood for several moments for pericare and hygiene                    Cognition Arousal/Alertness: Awake/alert Behavior During Therapy: WFL for tasks assessed/performed Overall Cognitive Status: Within Functional Limits for tasks assessed                      Exercises General Exercises - Lower Extremity Ankle Circles/Pumps: AROM;Both;10 reps;Seated (max encouragement to have patient participate)    General Comments        Pertinent Vitals/Pain Pain Assessment: Faces Pain Location: patient pointed to her chest, stating it hurts to breathe on the bi PAP Pain Descriptors / Indicators: Discomfort Pain Intervention(s): Repositioned;Monitored during session;Limited activity within patient's tolerance    Home Living                      Prior Function            PT Goals (current goals can now be found in the care plan section) Acute Rehab PT Goals Patient Stated Goal: Back to rehab PT Goal Formulation: With patient Time For Goal Achievement: 06/25/14 Potential to Achieve Goals: Good Progress towards PT goals: Progressing toward  goals    Frequency  Min 2X/week    PT Plan Current plan remains appropriate    Co-evaluation             End of Session Equipment Utilized During Treatment: Gait belt;Oxygen Activity Tolerance: Patient tolerated treatment well Patient left: in bed;with call bell/phone within reach (in chair position)     Time: 7282-0601 PT Time Calculation (min) (ACUTE ONLY): 23 min  Charges:  $Therapeutic Activity: 8-22 mins $Self Care/Home Management: 8-22                     G CodesDuncan Dull 2014-06-29, 12:52 PM Alben Deeds, [place] DPT  [phone]

## 2014-06-14 NOTE — Progress Notes (Signed)
Pt's son called and updated on pt's status. Pt was responsive at beginning of shift and now only responds to sternal rub. Dr Titus Mould called and ABG order received. Results received and called to [facility]. Pt's sister called and provided different password and RN didn't give information. RN asked sister to contact son or daughter and would be able to give information if she received correct password. Sister became angry and using foul language about situation. RN provided emotional support but continued to ask sister to call pt's son or daughter for more information

## 2014-06-14 NOTE — Progress Notes (Signed)
RN entered room and pt was trying to climb out of bed. Pt answered questions appropriately and followed commands.

## 2014-06-14 NOTE — Progress Notes (Signed)
PULMONARY / CRITICAL CARE MEDICINE   Name: [name] MRN: [id number] DOB: 1945/08/28    ADMISSION DATE:  06/07/2014 CONSULTATION DATE:  06/07/2014  REFERRING MD :  Dr. [name]  CHIEF COMPLAINT:  Lower extremity edema  INITIAL PRESENTATION: 68 year old female with recent admissions for pericardial effusions and recent pericardial window, presented to [place] office 12/22 for f/u OV. She was noted to have severe lower extremity edema. Echo was ordered and showed a recurrence of effusion and early tamponade physiology. She was admitted to their service. PCCM to consult.    STUDIES:  Spirometry Nov 2012: Fev1 0.85L/45% 2D Echo 12/22 >  Mod LVH, LVEF 55-60%, Grade 1 DD. PA peak pressure 75 mmHg. Large pericardial effusion, c/w early tamponade.   SIGNIFICANT EVENTS: 12/23 Pericardial window 12/24 Passed SBT and extubated  SUBJECTIVE:  Change in ms on bipap  VITAL SIGNS: Temp:  [97.3 F (36.3 C)-98.5 F (36.9 C)] 98.5 F (36.9 C) (12/29 0750) Pulse Rate:  [96-105] 100 (12/29 1000) Resp:  [18-41] 25 (12/29 1000) BP: (106-173)/(54-82) 143/73 mmHg (12/29 1000) SpO2:  [81 %-100 %] 95 % (12/29 1000) FiO2 (%):  [50 %] 50 % (12/29 1000) HEMODYNAMICS:   VENTILATOR SETTINGS: Vent Mode:  [-]  FiO2 (%):  [50 %] 50 % INTAKE / OUTPUT:  Intake/Output Summary (Last 24 hours) at 06/14/14 1041 Last data filed at 06/14/14 0800  Gross per 24 hour  Intake     50 ml  Output     20 ml  Net     30 ml    PHYSICAL EXAMINATION: General: distress on bipap, less responsive HEENT: WNL PULM: diminished, reduced CV: s1 s2 Reg, no M AB: BS+, soft, nontender Ext: warm, 1+ edema Neuro A&Ox4, anxious   LABS:  CBC  Recent Labs Lab 06/10/14 0500 06/11/14 0400 06/12/14 0400  WBC 7.3 6.8 6.6  HGB 9.4* 10.0* 9.9*  HCT 30.1* 33.4* 34.1*  PLT 131* 159 195   Coag's  Recent Labs Lab 06/07/14 2000  INR 0.93   BMET  Recent Labs Lab 06/11/14 0400 06/12/14 0400  06/13/14 0349  NA 137 137 135  K 3.8 4.1 4.5  CL 97 96 95*  CO2 35* 34* 33*  BUN 15 16 18   CREATININE 1.18* 1.37* 1.48*  GLUCOSE 104* 108* 116*   Electrolytes  Recent Labs Lab 06/11/14 0400 06/12/14 0400 06/13/14 0349  CALCIUM 8.7 8.8 8.9   Sepsis Markers No results for input(s): LATICACIDVEN, PROCALCITON, O2SATVEN in the last 168 hours. ABG  Recent Labs Lab 06/08/14 1853 06/09/14 0557 06/14/14 0845  PHART 7.315* 7.422 7.265*  PCO2ART 67.7* 53.5* 89.3*  PO2ART 58.0* 60.0* 74.0*   Liver Enzymes  Recent Labs Lab 06/10/14 0500  AST 15  ALT 22  ALKPHOS 51  BILITOT 0.2*  ALBUMIN 2.4*   Cardiac Enzymes No results for input(s): TROPONINI, PROBNP in the last 168 hours. Glucose  Recent Labs Lab 06/13/14 1113 06/13/14 1644 06/13/14 2025 06/13/14 2347 06/14/14 0331 06/14/14 0759  GLUCAP 128* 127* 109* 138* 118* 84    Imaging Dg Chest Port 1 View  06/13/2014   CLINICAL DATA:  Hypoxia  EXAM: PORTABLE CHEST - 1 VIEW  COMPARISON:  June 12, 2014  FINDINGS: Central catheter tip is in the superior vena cava near the cavoatrial junction. There are bilateral effusions with bibasilar consolidation. There is edema with cardiomegaly and pulmonary venous hypertension. No pneumothorax. No adenopathy. No bone lesions.  IMPRESSION: Evidence of congestive heart failure. Question superimposed pneumonia  in the bases. Central catheter tip in superior vena cava near the cavoatrial junction. No pneumothorax.   Electronically Signed   By: Lowella Grip M.D.   On: 06/13/2014 19:14     ASSESSMENT / PLAN:  PULMONARY A: Chronic respiratory failure  Acute resp failure 12/29 - edema increased Severe COPD without acute exacerbation (on 2L at home) RLL infiltrate vs ATX P:   Continue supplemental O2 to maintain SpO2 > 90% Continue preadmission brovana, budesonide PRN Duoneb NO re-intubation dc chest tube per cvts Declining status, likely hypercapnia - stat lasix, pcxr,  abg abg reviewed, change to 15/5 Neg balance goals DNI May need morphine infusion  CARDIOVASCULAR A:  Recurrent pericardial effusion, unclear etiology HTN, controlled  P:  Post op managment per cardiology/TCTS -  Hold anti-hypertensive meds Follow response to lasix  RENAL A:   AKI -improving UO  P:   Monitor BMET am Lasix 40 to IV, increase  GASTROINTESTINAL A:   GERD P:   SUP: PO protonix Make NPO distress, acidosis  HEMATOLOGIC A:   Anemia without overt bleedin P:  DVT px: SQ heparin  INFECTIOUS A:   No acute issues P:   Monitor off ABX   ENDOCRINE A:  DM 2 P:   CBG monitoring SSI  Summary - worsening status, BIPAP increase IPAP, lasix, NPO, may need comfort care  FEINSTEIN,DANIEL J. MD

## 2014-06-15 DIAGNOSIS — J810 Acute pulmonary edema: Secondary | ICD-10-CM | POA: Insufficient documentation

## 2014-06-15 LAB — BASIC METABOLIC PANEL
Anion gap: 8 (ref 5–15)
BUN: 18 mg/dL (ref 6–23)
CHLORIDE: 88 meq/L — AB (ref 96–112)
CO2: 45 mmol/L (ref 19–32)
Calcium: 8.8 mg/dL (ref 8.4–10.5)
Creatinine, Ser: 1.32 mg/dL — ABNORMAL HIGH (ref 0.50–1.10)
GFR calc non Af Amer: 40 mL/min — ABNORMAL LOW (ref >90)
GFR, EST AFRICAN AMERICAN: 47 mL/min — AB (ref >90)
GLUCOSE: 72 mg/dL (ref 70–99)
Potassium: 3.7 mmol/L (ref 3.5–5.1)
Sodium: 141 mmol/L (ref 135–145)

## 2014-06-15 LAB — GLUCOSE, CAPILLARY
GLUCOSE-CAPILLARY: 87 mg/dL (ref 70–99)
GLUCOSE-CAPILLARY: 112 mg/dL — AB (ref 70–99)
Glucose-Capillary: 111 mg/dL — ABNORMAL HIGH (ref 70–99)
Glucose-Capillary: 70 mg/dL (ref 70–99)
Glucose-Capillary: 86 mg/dL (ref 70–99)
Glucose-Capillary: 163 mg/dL — ABNORMAL HIGH (ref 70–99)

## 2014-06-15 LAB — PHOSPHORUS: Phosphorus: 2.4 mg/dL (ref 2.3–4.6)

## 2014-06-15 LAB — MAGNESIUM: MAGNESIUM: 1.5 mg/dL (ref 1.5–2.5)

## 2014-06-15 MED ORDER — MAGNESIUM SULFATE 4 GM/100ML IV SOLN
4.0000 g | Freq: Once | INTRAVENOUS | Status: AC
  Administered 2014-06-15: 4 g via INTRAVENOUS
  Filled 2014-06-15: qty 100

## 2014-06-15 MED ORDER — POTASSIUM CHLORIDE 10 MEQ/50ML IV SOLN
10.0000 meq | INTRAVENOUS | Status: AC
  Administered 2014-06-15 (×2): 10 meq via INTRAVENOUS
  Filled 2014-06-15 (×2): qty 50

## 2014-06-15 MED ORDER — MAGNESIUM SULFATE 50 % IJ SOLN
4.0000 g | Freq: Once | INTRAVENOUS | Status: DC
  Filled 2014-06-15: qty 8

## 2014-06-15 MED ORDER — DEXTROSE 50 % IV SOLN
INTRAVENOUS | Status: AC
  Administered 2014-06-15: 25 mL
  Filled 2014-06-15: qty 50

## 2014-06-15 NOTE — Clinical Social Work Note (Addendum)
Patient moved to [place], informed unit CSW that patient had a chest tube on Monday and was not able to be assessed, but FL2 was completed.  Patient now has a bipap and has npo this Education officer, museum to sign off.  Jones Broom. [place], MSW, [place] 06/15/2014 9:00 AM

## 2014-06-15 NOTE — Progress Notes (Signed)
Pt's blood glucose was checked and found to be 70. Hypoglycemic protocol was initiated and pt given 25 of D50. Pt's CBG checked 15 min later and was 111. Will continue to monitor.

## 2014-06-15 NOTE — Progress Notes (Addendum)
TCTS DAILY ICU PROGRESS NOTE                   [address]            [place] [address]          [phone]   7 Days Post-Op Procedure(s) (LRB): PERICARDIAL WINDOW (N/A) VIDEO ASSISTED THORACOSCOPY (VATS) (Left)  Total Length of Stay:  LOS: 8 days   Subjective: Awake and alert, responsive this am.  Currently on Bipap.   Objective: Vital signs in last 24 hours: Temp:  [97.8 F (36.6 C)-98.6 F (37 C)] 98.2 F (36.8 C) (12/30 0800) Pulse Rate:  [84-107] 93 (12/30 0800) Cardiac Rhythm:  [-] Normal sinus rhythm (12/30 0300) Resp:  [10-33] 25 (12/30 0800) BP: (107-163)/(55-97) 147/96 mmHg (12/30 0800) SpO2:  [82 %-100 %] 100 % (12/30 0800) FiO2 (%):  [50 %] 50 % (12/30 0300)  Filed Weights   06/10/14 0600 06/11/14 0600 06/12/14 0600  Weight: 179 lb 4.8 oz (81.33 kg) 180 lb 12.4 oz (82 kg) 178 lb 12.7 oz (81.1 kg)    Weight change:       Intake/Output from previous day: 12/29 0701 - 12/30 0700 In: -  Out: 270 [Chest Tube:270]     Current Meds: Scheduled Meds: . antiseptic oral rinse  7 mL Mouth Rinse BID  . arformoterol  15 mcg Nebulization BID  . bisacodyl  10 mg Oral Daily  . budesonide (PULMICORT) nebulizer solution  0.5 mg Nebulization BID  . colchicine  0.6 mg Oral BID  . enoxaparin (LOVENOX) injection  30 mg Subcutaneous Q24H  . furosemide  80 mg Intravenous Q12H  . insulin aspart  0-20 Units Subcutaneous TID WC  . ipratropium-albuterol  3 mL Nebulization Q6H  . multivitamin with minerals  1 tablet Oral Daily  . pantoprazole  40 mg Oral Daily  . senna-docusate  1 tablet Oral QHS  . sertraline  50 mg Oral QHS  . sodium chloride  10-40 mL Intracatheter Q12H   Continuous Infusions:  PRN Meds:.albuterol, ALPRAZolam, fentaNYL, ondansetron (ZOFRAN) IV, oxyCODONE, potassium chloride, sodium chloride, traMADol   Physical Exam: General appearance: alert and cooperative Heart: regular rate and rhythm Lungs: Diminished BS overall with  some coarse BS in bases Wound: Dressed and dry  Chest tube: No air leak   Lab Results: CBC:No results for input(s): WBC, HGB, HCT, PLT in the last 72 hours. BMET:  Recent Labs  06/13/14 0349 06/15/14 0350  NA 135 141  K 4.5 3.7  CL 95* 88*  CO2 33* 45*  GLUCOSE 116* 72  BUN 18 18  CREATININE 1.48* 1.32*  CALCIUM 8.9 8.8    PT/INR: No results for input(s): LABPROT, INR in the last 72 hours. Radiology: Dg Chest Port 1 View  06/14/2014   CLINICAL DATA:  History of asthma, COPD, diabetes and hypertension. Chest tube placement.  EXAM: PORTABLE CHEST - 1 VIEW  COMPARISON:  06/13/2014; 06/11/2014; 06/09/2014  FINDINGS: Grossly unchanged enlarged cardiac silhouette and mediastinal contours. Stable position of support apparatus. No pneumothorax. Worsening consolidative opacities within the right middle lobe. Unchanged small bilateral effusions and associated bibasilar opacities. Pulmonary venous congestion without frank evidence of edema. Unchanged bones.  IMPRESSION: 1.  Stable positioning of support apparatus.  No pneumothorax. 2. Worsening right middle lobe consolidative opacities favored to represent a combination of airspace disease and atelectasis/collapse. 3. Otherwise, unchanged small effusions and associated bibasilar opacities, atelectasis versus infiltrate. 4. Pulmonary venous congestion without frank  evidence of edema   Electronically Signed   By: Sandi Mariscal M.D.   On: 06/14/2014 10:02   Dg Chest Port 1 View  06/13/2014   CLINICAL DATA:  Hypoxia  EXAM: PORTABLE CHEST - 1 VIEW  COMPARISON:  June 12, 2014  FINDINGS: Central catheter tip is in the superior vena cava near the cavoatrial junction. There are bilateral effusions with bibasilar consolidation. There is edema with cardiomegaly and pulmonary venous hypertension. No pneumothorax. No adenopathy. No bone lesions.  IMPRESSION: Evidence of congestive heart failure. Question superimposed pneumonia in the bases. Central catheter  tip in superior vena cava near the cavoatrial junction. No pneumothorax.   Electronically Signed   By: Lowella Grip M.D.   On: 06/13/2014 19:14     Assessment/Plan: S/P Procedure(s) (LRB): PERICARDIAL WINDOW (N/A) VIDEO ASSISTED THORACOSCOPY (VATS) (Left) CT output is elevated today, 270 ml serous fluid out over past 24 hrs. Will continue CT for now, may be able to place to water seal since she is more stable this am. She is more responsive and less respiratory distress this am. Pulmonary issues per CCM.     COLLINS,GINA H 06/15/2014 8:24 AM  Chart reviewed, patient examined, agree with above. Would keep chest tube in until no drainage for a couple days since this is in the pericardium and she had recurrent symptomatic pericardial effusion.

## 2014-06-15 NOTE — Progress Notes (Signed)
PULMONARY / CRITICAL CARE MEDICINE   Name: [name] MRN: [id number] DOB: 1945/10/03    ADMISSION DATE:  06/07/2014 CONSULTATION DATE:  06/07/2014  REFERRING MD :  Dr. [name]  CHIEF COMPLAINT:  Lower extremity edema  INITIAL PRESENTATION: 68 year old female with recent admissions for pericardial effusions and recent pericardial window, presented to [place] office 12/22 for f/u OV. She was noted to have severe lower extremity edema. Echo was ordered and showed a recurrence of effusion and early tamponade physiology. She was admitted to their service. PCCM to consult.    STUDIES:  Spirometry Nov 2012: Fev1 0.85L/45% 2D Echo 12/22 >  Mod LVH, LVEF 55-60%, Grade 1 DD. PA peak pressure 75 mmHg. Large pericardial effusion, c/w early tamponade.   SIGNIFICANT EVENTS: 12/23 Pericardial window 12/24 Passed SBT and extubated June 26, 2023- near death from resp failure, NIMV  SUBJECTIVE:  Off BIPAP this am   VITAL SIGNS: Temp:  [97.8 F (36.6 C)-98.6 F (37 C)] 98.2 F (36.8 C) (12/30 0800) Pulse Rate:  [84-123] 123 (12/30 0940) Resp:  [10-33] 18 (12/30 0940) BP: (122-163)/(70-97) 147/96 mmHg (12/30 0825) SpO2:  [82 %-100 %] 98 % (12/30 0940) FiO2 (%):  [50 %] 50 % (12/30 0825) HEMODYNAMICS:   VENTILATOR SETTINGS: Vent Mode:  [-]  FiO2 (%):  [50 %] 50 % INTAKE / OUTPUT:  Intake/Output Summary (Last 24 hours) at 06/15/14 1140 Last data filed at 06/15/14 0355  Gross per 24 hour  Intake      0 ml  Output    270 ml  Net   -270 ml    PHYSICAL EXAMINATION: General:no distres just off BIPAP HEENT: wnl PULM: diminished, reduced, not coarse CV: s1 s2 Reg, no M AB: BS+, soft, nontender Ext: warm, min+ edema, increased in feet Neuro Alert this am , moves all ext, follows commands   LABS:  CBC  Recent Labs Lab 06/10/14 0500 06/11/14 0400 06/12/14 0400  WBC 7.3 6.8 6.6  HGB 9.4* 10.0* 9.9*  HCT 30.1* 33.4* 34.1*  PLT 131* 159 195   Coag's No results for  input(s): APTT, INR in the last 168 hours. BMET  Recent Labs Lab 06/12/14 0400 06/13/14 0349 06/15/14 0350  NA 137 135 141  K 4.1 4.5 3.7  CL 96 95* 88*  CO2 34* 33* 45*  BUN 16 18 18   CREATININE 1.37* 1.48* 1.32*  GLUCOSE 108* 116* 72   Electrolytes  Recent Labs Lab 06/12/14 0400 06/13/14 0349 06/15/14 0350  CALCIUM 8.8 8.9 8.8  MG  --   --  1.5  PHOS  --   --  2.4   Sepsis Markers No results for input(s): LATICACIDVEN, PROCALCITON, O2SATVEN in the last 168 hours. ABG  Recent Labs Lab 06/08/14 1853 06/09/14 0557 Jun 25, 2014 0845  PHART 7.315* 7.422 7.265*  PCO2ART 67.7* 53.5* 89.3*  PO2ART 58.0* 60.0* 74.0*   Liver Enzymes  Recent Labs Lab 06/10/14 0500  AST 15  ALT 22  ALKPHOS 51  BILITOT 0.2*  ALBUMIN 2.4*   Cardiac Enzymes No results for input(s): TROPONINI, PROBNP in the last 168 hours. Glucose  Recent Labs Lab 25-Jun-2014 1248 06/25/2014 1722 2014-06-25 2005 06/25/2014 2323 06/15/14 0342 06/15/14 0414  GLUCAP 86 79 91 80 70 111*    Imaging Dg Chest Port 1 View  2014-06-25   CLINICAL DATA:  History of asthma, COPD, diabetes and hypertension. Chest tube placement.  EXAM: PORTABLE CHEST - 1 VIEW  COMPARISON:  06/13/2014; 06/11/2014; 06/09/2014  FINDINGS: Grossly unchanged enlarged  cardiac silhouette and mediastinal contours. Stable position of support apparatus. No pneumothorax. Worsening consolidative opacities within the right middle lobe. Unchanged small bilateral effusions and associated bibasilar opacities. Pulmonary venous congestion without frank evidence of edema. Unchanged bones.  IMPRESSION: 1.  Stable positioning of support apparatus.  No pneumothorax. 2. Worsening right middle lobe consolidative opacities favored to represent a combination of airspace disease and atelectasis/collapse. 3. Otherwise, unchanged small effusions and associated bibasilar opacities, atelectasis versus infiltrate. 4. Pulmonary venous congestion without frank evidence of  edema   Electronically Signed   By: Sandi Mariscal M.D.   On: 06/14/2014 10:02     ASSESSMENT / PLAN:  PULMONARY A: Chronic respiratory failure  Acute resp failure 12/29 - edema increased Severe COPD without acute exacerbation (on 2L at home) RLL infiltrate vs ATX P:   Continue supplemental O2 to maintain SpO2 > 90% Continue preadmission brovana, budesonide PRN Duoneb perciardial tube per cvts Much improved, allow off NIMV throughout day, consider use tonight Neg balance goals remain, keep same dose  CARDIOVASCULAR A:  Recurrent pericardial effusion, unclear etiology HTN, controlled  P:  Post op managment per cardiology/TCTS - to water seal Hold anti-hypertensive meds - remains normal See renal  RENAL A:   AKI -improving UO Inaccurate outputs hypomag hypoK P:   Monitor BMET am Tolerated lasix well, inaccurate outputs likely much more neg then recorded Mag, k supp kvo  GASTROINTESTINAL A:   GERD P:   SUP: PO protonix Advance slowly as resp status resolving  HEMATOLOGIC A:   Anemia without overt bleedin P:  DVT px: SQ heparin  INFECTIOUS A:   No acute issues P:   Monitor off ABX   ENDOCRINE A:  DM 2 P:   CBG monitoring SSI  Summary - much improved, dc NIMV, use at night, lasix maintain, supp k, mag  FEINSTEIN,DANIEL J. MD

## 2014-06-16 ENCOUNTER — Inpatient Hospital Stay (HOSPITAL_COMMUNITY): Payer: Medicare Other

## 2014-06-16 LAB — PHOSPHORUS: Phosphorus: 2.2 mg/dL — ABNORMAL LOW (ref 2.3–4.6)

## 2014-06-16 LAB — GLUCOSE, CAPILLARY
GLUCOSE-CAPILLARY: 101 mg/dL — AB (ref 70–99)
Glucose-Capillary: 102 mg/dL — ABNORMAL HIGH (ref 70–99)
Glucose-Capillary: 109 mg/dL — ABNORMAL HIGH (ref 70–99)
Glucose-Capillary: 136 mg/dL — ABNORMAL HIGH (ref 70–99)
Glucose-Capillary: 137 mg/dL — ABNORMAL HIGH (ref 70–99)

## 2014-06-16 LAB — MAGNESIUM: Magnesium: 2.1 mg/dL (ref 1.5–2.5)

## 2014-06-16 MED ORDER — POTASSIUM CHLORIDE CRYS ER 20 MEQ PO TBCR
40.0000 meq | EXTENDED_RELEASE_TABLET | Freq: Once | ORAL | Status: AC
  Administered 2014-06-16: 40 meq via ORAL
  Filled 2014-06-16: qty 2

## 2014-06-16 NOTE — Progress Notes (Signed)
Encouraged to be back to bed for the cvc dressing change but refused, wants it to be done tonight when back to bed  At around 11pm.

## 2014-06-16 NOTE — Evaluation (Signed)
Physical Therapy Evaluation Patient Details Name: [name] MRN: [id number] DOB: December 18, 1945 Today's Date: 06/16/2014   History of Present Illness  68 year old female with recent admissions for pericardial effusions and recent pericardial window, presented to [place] office 12/22 for f/u OV. She was noted to have severe lower extremity edema. Echo was ordered and showed a recurrence of effusion and early tamponade physiology. S/p pericardial window 12/23, extubated 12/24.  Clinical Impression  Patient tolerated ambulation on 4 liters O2 with SpO2 >89%, multiple standing rest breaks. Tolerated there-ex well and demonstrates improved overall cognition this session. Will continue to see and progress as tolerated.     Follow Up Recommendations SNF;Supervision for mobility/OOB    Equipment Recommendations  None recommended by PT    Recommendations for Other Services OT consult     Precautions / Restrictions Precautions Precautions: Fall Restrictions Weight Bearing Restrictions: No      Mobility  Bed Mobility               General bed mobility comments: received in chair  Transfers Overall transfer level: Needs assistance Equipment used: Rolling walker (2 wheeled) Transfers: Sit to/from [facility] Sit to Stand: Min guard Stand pivot transfers: Min assist       General transfer comment: VCs for hand placement and safety, min guard for stability with use of RW, min assist for pivot to bedside commode without RW  Ambulation/Gait Ambulation/Gait assistance: Min assist Ambulation Distance (Feet): 120 Feet Assistive device: Rolling walker (2 wheeled) Gait Pattern/deviations: Step-through pattern;Decreased stride length;Staggering left;Staggering right;Trunk flexed Gait velocity: decreased Gait velocity interpretation: Below normal speed for age/gender General Gait Details: 2 standing rest breaks during ambulation, cues for pursed lip breathing and  realxation of UE.    Stairs            Wheelchair Mobility    Modified Rankin (Stroke Patients Only)       Balance   Sitting-balance support: No upper extremity supported Sitting balance-Leahy Scale: Fair     Standing balance support: Bilateral upper extremity supported Standing balance-Leahy Scale: Poor Standing balance comment: stood several moments to view outside through the window, perform cognitive tasks while viewing. Patient tolerated well.                              Pertinent Vitals/Pain Pain Assessment: 0-10 Pain Score: 3  Pain Location: chest tube insertion site Pain Descriptors / Indicators: Discomfort Pain Intervention(s): Monitored during session;Repositioned    Home Living                        Prior Function                 Hand Dominance        Extremity/Trunk Assessment                         Communication      Cognition Arousal/Alertness: Awake/alert Behavior During Therapy: WFL for tasks assessed/performed Overall Cognitive Status: Within Functional Limits for tasks assessed                      General Comments      Exercises General Exercises - Lower Extremity Ankle Circles/Pumps: AROM;Both;10 reps;Seated Long Arc Quad: Strengthening;Both;10 reps;Seated (edge of recliner prior to activity) Hip Flexion/Marching: Strengthening;Both;5 reps (standing prior to ambulation)  Assessment/Plan    PT Assessment    PT Diagnosis     PT Problem List    PT Treatment Interventions     PT Goals (Current goals can be found in the Care Plan section) Acute Rehab PT Goals Patient Stated Goal: Back to rehab PT Goal Formulation: With patient Time For Goal Achievement: 06/25/14 Potential to Achieve Goals: Good    Frequency Min 2X/week   Barriers to discharge        Co-evaluation               End of Session Equipment Utilized During Treatment: Gait belt;Oxygen Activity  Tolerance: Patient tolerated treatment well Patient left: in chair;with call bell/phone within reach Nurse Communication: Mobility status         Time: 6945-0388 PT Time Calculation (min) (ACUTE ONLY): 25 min   Charges:     PT Treatments $Gait Training: 8-22 mins $Therapeutic Activity: 8-22 mins   PT G CodesDuncan Dull 2014/06/20, 1:27 PM Alben Deeds, [place] DPT  [phone]

## 2014-06-16 NOTE — Progress Notes (Signed)
PULMONARY / CRITICAL CARE MEDICINE   Name: KYLIANA STANDEN MRN: [id number] DOB: 10-Nov-1945    ADMISSION DATE:  06/07/2014 CONSULTATION DATE:  06/07/2014  REFERRING MD :  Dr. [name]  CHIEF COMPLAINT:  Lower extremity edema  INITIAL PRESENTATION: 68 year old female with recent admissions for pericardial effusions and recent pericardial window, presented to [place] office 12/22 for f/u OV. She was noted to have severe lower extremity edema. Echo was ordered and showed a recurrence of effusion and early tamponade physiology. She was admitted to their service. PCCM to consult.    STUDIES:  Spirometry Nov 2012: Fev1 0.85L/45% 2D Echo 12/22 >  Mod LVH, LVEF 55-60%, Grade 1 DD. PA peak pressure 75 mmHg. Large pericardial effusion, c/w early tamponade.   SIGNIFICANT EVENTS: 12/23 Pericardial window 12/24 Passed SBT and extubated 07/13/23- near death from resp failure, NIMV 14-Jul-2023- much improved  SUBJECTIVE:  In chair, no distress again today, ambulated well  VITAL SIGNS: Temp:  [98 F (36.7 C)-98.6 F (37 C)] 98.3 F (36.8 C) (12/31 1100) Pulse Rate:  [72-97] 94 (12/31 1300) Resp:  [8-28] 21 (12/31 1300) BP: (89-135)/(36-78) 90/56 mmHg (12/31 1300) SpO2:  [91 %-100 %] 93 % (12/31 1300) FiO2 (%):  [50 %] 50 % (12/31 0300) HEMODYNAMICS:   VENTILATOR SETTINGS: Vent Mode:  [-]  FiO2 (%):  [50 %] 50 % INTAKE / OUTPUT:  Intake/Output Summary (Last 24 hours) at 06/16/14 1331 Last data filed at 06/16/14 1257  Gross per 24 hour  Intake    340 ml  Output   1380 ml  Net  -1040 ml    PHYSICAL EXAMINATION: General:no distres in chair HEENT: wnl, jvd down PULM: reduced, bases CV: s1 s2 Reg, no M AB: BS+, soft, nontender Ext: warm, trace reduced+ edema, increased in feet Neuro Alert this am , moves all ext, follows commands   LABS:  CBC  Recent Labs Lab 06/10/14 0500 06/11/14 0400 06/12/14 0400  WBC 7.3 6.8 6.6  HGB 9.4* 10.0* 9.9*  HCT 30.1* 33.4* 34.1*  PLT 131*  159 195   Coag's No results for input(s): APTT, INR in the last 168 hours. BMET  Recent Labs Lab 06/13/14 0349 July 13, 2014 0350 06/16/14 0500  NA 135 141 137  K 4.5 3.7 3.3*  CL 95* 88* 80*  CO2 33* 45* 48*  BUN 18 18 15   CREATININE 1.48* 1.32* 1.22*  GLUCOSE 116* 72 94   Electrolytes  Recent Labs Lab 06/13/14 0349 07-13-2014 0350 06/16/14 0500  CALCIUM 8.9 8.8 8.5  MG  --  1.5 2.1  PHOS  --  2.4 2.2*   Sepsis Markers No results for input(s): LATICACIDVEN, PROCALCITON, O2SATVEN in the last 168 hours. ABG  Recent Labs Lab 07-12-2014 0845  PHART 7.265*  PCO2ART 89.3*  PO2ART 74.0*   Liver Enzymes  Recent Labs Lab 06/10/14 0500  AST 15  ALT 22  ALKPHOS 51  BILITOT 0.2*  ALBUMIN 2.4*   Cardiac Enzymes No results for input(s): TROPONINI, PROBNP in the last 168 hours. Glucose  Recent Labs Lab 13-Jul-2014 1220 07-13-2014 1705 07-13-14 1945 06/16/14 0053 06/16/14 0733 06/16/14 1140  GLUCAP 87 112* 163* 102* 101* 136*    Imaging No results found.   ASSESSMENT / PLAN:  PULMONARY A: Chronic respiratory failure  Acute resp failure 07-13-2023 - resolving with diuresis Severe COPD without acute exacerbation (on 2L at home) RLL infiltrate vs ATX P:   Continue supplemental O2 to maintain SpO2 > 90%, on [place], ambulating Continue preadmission  brovana, budesonide PRN Duoneb perciardial tube per cvts, remains given output Dc NIMV completly Neg balance goals remain, keep same dose, she has done very well with this approach  CARDIOVASCULAR A:  Recurrent pericardial effusion, unclear etiology HTN, controlled  P:  Post op managment per cardiology/TCTS - to water seal, drain to remain Hold anti-hypertensive meds - remains normal See renal  RENAL A:   AKI -improving UO with diuresis hypoK P:   Monitor BMET am Tolerated lasix well, keep same dosing, crt resolving with this k supp kvo  GASTROINTESTINAL A:   GERD P:   SUP: PO protonix, keep as home  med diet  HEMATOLOGIC A:   Anemia without overt bleedin P:  DVT px: SQ heparin Ambulate further , if successful could dc hep  INFECTIOUS A:   No acute issues P:   Monitor off ABX   ENDOCRINE A:  DM 2 P:   CBG monitoring SSI  Summary - wow, much improved further, in chair, maintain lasix, dc NIMV completely, supp k , drain to remain with current output, ambulate further  FEINSTEIN,DANIEL J. MD

## 2014-06-16 NOTE — Progress Notes (Addendum)
[facility] DAILY ICU PROGRESS NOTE                   [address]            [place] [address]          [phone]   8 Days Post-Op Procedure(s) (LRB): PERICARDIAL WINDOW (N/A) VIDEO ASSISTED THORACOSCOPY (VATS) (Left)  Total Length of Stay:  LOS: 9 days   Subjective: OOB in chair.  Breathing comfortably on 4L via [place].  Conversant and pleasant.  No complaints.   Objective: Vital signs in last 24 hours: Temp:  [98 F (36.7 C)-98.7 F (37.1 C)] 98 F (36.7 C) (12/31 0700) Pulse Rate:  [72-123] 85 (12/31 0800) Cardiac Rhythm:  [-] Normal sinus rhythm (12/31 0700) Resp:  [8-25] 25 (12/31 0800) BP: (89-135)/(59-79) 111/59 mmHg (12/31 0800) SpO2:  [94 %-100 %] 95 % (12/31 0800) FiO2 (%):  [50 %] 50 % (12/31 0300)  Filed Weights   06/10/14 0600 06/11/14 0600 06/12/14 0600  Weight: 179 lb 4.8 oz (81.33 kg) 180 lb 12.4 oz (82 kg) 178 lb 12.7 oz (81.1 kg)    Weight change:    Hemodynamic parameters for last 24 hours:    Intake/Output from previous day: 12/30 0701 - 12/31 0700 In: 220 [P.O.:220] Out: 1080 [Urine:900; Chest Tube:180]  Intake/Output this shift:    Current Meds: Scheduled Meds: . antiseptic oral rinse  7 mL Mouth Rinse BID  . arformoterol  15 mcg Nebulization BID  . bisacodyl  10 mg Oral Daily  . budesonide (PULMICORT) nebulizer solution  0.5 mg Nebulization BID  . colchicine  0.6 mg Oral BID  . enoxaparin (LOVENOX) injection  30 mg Subcutaneous Q24H  . insulin aspart  0-20 Units Subcutaneous TID WC  . ipratropium-albuterol  3 mL Nebulization Q6H  . multivitamin with minerals  1 tablet Oral Daily  . pantoprazole  40 mg Oral Daily  . senna-docusate  1 tablet Oral QHS  . sertraline  50 mg Oral QHS  . sodium chloride  10-40 mL Intracatheter Q12H   Continuous Infusions:  PRN Meds:.albuterol, ALPRAZolam, fentaNYL, ondansetron (ZOFRAN) IV, oxyCODONE, potassium chloride, sodium chloride, traMADol   Physical Exam: General appearance: alert,  cooperative and no distress Heart: regular rate and rhythm Lungs: Diminished BS in bases bilaterally Wound: Clean and dry Chest tube: No air leak   Lab Results: CBC:No results for input(s): WBC, HGB, HCT, PLT in the last 72 hours. BMET:  Recent Labs  06/15/14 0350 06/16/14 0500  NA 141 137  K 3.7 3.3*  CL 88* 80*  CO2 45* 48*  GLUCOSE 72 94  BUN 18 15  CREATININE 1.32* 1.22*  CALCIUM 8.8 8.5    PT/INR: No results for input(s): LABPROT, INR in the last 72 hours. Radiology: Dg Chest Port 1 View  06/16/2014   CLINICAL DATA:  Pulmonary edema.  EXAM: PORTABLE CHEST - 1 VIEW  COMPARISON:  06/14/2014.  FINDINGS: Right IJ line and left chest tube in stable position. Cardiomegaly. Pulmonary vascularity normal. Persistent bibasilar pulmonary infiltrates and bilateral pleural effusions. No pneumothorax. No acute osseous abnormality.  IMPRESSION: 1. Lines and tubes in stable position. 2. Persistent bibasilar pulmonary infiltrates and bilateral pleural effusions. Bibasilar pulmonary infiltrates could be related to pneumonia and/or pulmonary edema. 3. Persistent severe cardiomegaly.   Electronically Signed   By: Marcello Moores  Register   On: 06/16/2014 07:33   Dg Chest Port 1 View  06/14/2014   CLINICAL DATA:  History of  asthma, COPD, diabetes and hypertension. Chest tube placement.  EXAM: PORTABLE CHEST - 1 VIEW  COMPARISON:  06/13/2014; 06/11/2014; 06/09/2014  FINDINGS: Grossly unchanged enlarged cardiac silhouette and mediastinal contours. Stable position of support apparatus. No pneumothorax. Worsening consolidative opacities within the right middle lobe. Unchanged small bilateral effusions and associated bibasilar opacities. Pulmonary venous congestion without frank evidence of edema. Unchanged bones.  IMPRESSION: 1.  Stable positioning of support apparatus.  No pneumothorax. 2. Worsening right middle lobe consolidative opacities favored to represent a combination of airspace disease and  atelectasis/collapse. 3. Otherwise, unchanged small effusions and associated bibasilar opacities, atelectasis versus infiltrate. 4. Pulmonary venous congestion without frank evidence of edema   Electronically Signed   By: Sandi Mariscal M.D.   On: 06/14/2014 10:02     Assessment/Plan: S/P Procedure(s) (LRB): PERICARDIAL WINDOW (N/A) VIDEO ASSISTED THORACOSCOPY (VATS) (Left) Pulmonary status much improved.  Continue current care. Pericardial tube still draining a fair amount, but overall output is decreasing. Continue pericardial drain for now.  COLLINS,GINA H 06/16/2014 9:05 AM  I have seen and examined the patient and agree with the assessment and plan as outlined.  Leave chest tube in on water seal until dry.  Remainder of plan per Pulm/CCM team.  Rexene Alberts 06/16/2014 10:29 AM

## 2014-06-17 DIAGNOSIS — R0600 Dyspnea, unspecified: Secondary | ICD-10-CM

## 2014-06-17 DIAGNOSIS — R0603 Acute respiratory distress: Secondary | ICD-10-CM | POA: Insufficient documentation

## 2014-06-17 LAB — BASIC METABOLIC PANEL
ANION GAP: 9 (ref 5–15)
Anion gap: 10 (ref 5–15)
BUN: 15 mg/dL (ref 6–23)
BUN: 14 mg/dL (ref 6–23)
CALCIUM: 8.5 mg/dL (ref 8.4–10.5)
CALCIUM: 8.5 mg/dL (ref 8.4–10.5)
CHLORIDE: 80 meq/L — AB (ref 96–112)
CHLORIDE: 84 meq/L — AB (ref 96–112)
CO2: 48 mmol/L (ref 19–32)
CO2: 43 mmol/L (ref 19–32)
CREATININE: 1.3 mg/dL — AB (ref 0.50–1.10)
Creatinine, Ser: 1.22 mg/dL — ABNORMAL HIGH (ref 0.50–1.10)
GFR calc non Af Amer: 44 mL/min — ABNORMAL LOW (ref >90)
GFR calc non Af Amer: 41 mL/min — ABNORMAL LOW (ref >90)
GFR, EST AFRICAN AMERICAN: 52 mL/min — AB (ref >90)
GFR, EST AFRICAN AMERICAN: 48 mL/min — AB (ref >90)
Glucose, Bld: 94 mg/dL (ref 70–99)
Glucose, Bld: 109 mg/dL — ABNORMAL HIGH (ref 70–99)
Potassium: 3.3 mmol/L — ABNORMAL LOW (ref 3.5–5.1)
Potassium: 3.8 mmol/L (ref 3.5–5.1)
Sodium: 137 mmol/L (ref 135–145)
Sodium: 137 mmol/L (ref 135–145)

## 2014-06-17 LAB — GLUCOSE, CAPILLARY
GLUCOSE-CAPILLARY: 154 mg/dL — AB (ref 70–99)
GLUCOSE-CAPILLARY: 73 mg/dL (ref 70–99)
GLUCOSE-CAPILLARY: 197 mg/dL — AB (ref 70–99)
Glucose-Capillary: 105 mg/dL — ABNORMAL HIGH (ref 70–99)
Glucose-Capillary: 75 mg/dL (ref 70–99)

## 2014-06-17 LAB — PHOSPHORUS: Phosphorus: 1.9 mg/dL — ABNORMAL LOW (ref 2.3–4.6)

## 2014-06-17 LAB — MAGNESIUM: Magnesium: 2.1 mg/dL (ref 1.5–2.5)

## 2014-06-17 MED ORDER — POTASSIUM PHOSPHATES 15 MMOLE/5ML IV SOLN
15.0000 mmol | Freq: Once | INTRAVENOUS | Status: AC
  Administered 2014-06-17: 15 mmol via INTRAVENOUS
  Filled 2014-06-17: qty 5

## 2014-06-17 NOTE — Progress Notes (Signed)
Pt ambulated around unit twice using wheelchair as walker. 3L [place] continuous w sats above 90%. Pt stopped to rest twice but kept up a good pace, talking as she went, and was not winded when returned to room.  Pt also participated in her bath earlier and walked out into the hallway with NT.

## 2014-06-17 NOTE — Progress Notes (Signed)
CRITICAL VALUE ALERT  Critical value received:  CO2 43  Date of notification:  06/17/14  Time of notification:  3382  Critical value read back:Yes.    Nurse who received alert: Newman Nickels RN  MD notified (1st page): ELINK  Time of first page:  [phone]  Expected value. Improving from previous results. No new orders.

## 2014-06-17 NOTE — Progress Notes (Signed)
PULMONARY / CRITICAL CARE MEDICINE   Name: [name] MRN: [id number] DOB: [birth year]    ADMISSION DATE:  06/07/2014 CONSULTATION DATE:  06/07/2014  REFERRING MD :  Dr. [name]  CHIEF COMPLAINT:  Lower extremity edema  INITIAL PRESENTATION: 69 year old female with recent admissions for pericardial effusions and recent pericardial window, presented to [facility] office 12/22 for f/u OV. She was noted to have severe lower extremity edema. Echo was ordered and showed a recurrence of effusion and early tamponade physiology. She was admitted to their service. PCCM to consult.    STUDIES:  Spirometry Nov 2012: Fev1 0.85L/45% 2D Echo 12/22 >  Mod LVH, LVEF 55-60%, Grade 1 DD. PA peak pressure 75 mmHg. Large pericardial effusion, c/w early tamponade.   SIGNIFICANT EVENTS: 12/23 Pericardial window 12/24 Passed SBT and extubated Jun 19, 2023- near death from resp failure, NIMV 20-Jun-2023- much improved 06/16/14: In chair, no distress again today, ambulated well   SUBJECTIVE/OVERNIGHT/INTERVAL HX 06/17/14: ambulated with assist. Down  To home 3L [place]. Still draining from pericardial drain but reduced per CVTS. Feeling stronger. Struggling with long term health decision - stay alone in [place] v live in [place] lving in [place] near kids  VITAL SIGNS: Temp:  [98 F (36.7 C)-99.5 F (37.5 C)] 99.5 F (37.5 C) (01/01 1635) Pulse Rate:  [83-91] 91 (01/01 1635) Resp:  [17-27] 27 (01/01 1635) BP: (87-103)/(51-66) 99/55 mmHg (01/01 1635) SpO2:  [94 %-99 %] 98 % (01/01 1635) HEMODYNAMICS:   VENTILATOR SETTINGS:   INTAKE / OUTPUT:  Intake/Output Summary (Last 24 hours) at 06/17/14 1740 Last data filed at 06/17/14 0400  Gross per 24 hour  Intake    150 ml  Output    240 ml  Net    -90 ml    PHYSICAL EXAMINATION: General:no distres in chair, looks good HEENT: wnl, jvd down PULM: reduced, bases CV: s1 s2 Reg, no M CHest: pericardial drain +  AB: BS+, soft, nontender Ext: warm, trace reduced+  edema, increased in feet Neuro Alert this am , moves all ext, follows commands   LABS: PULMONARY  Recent Labs Lab 06/18/14 0845  PHART 7.265*  PCO2ART 89.3*  PO2ART 74.0*  HCO3 40.6*  TCO2 43  O2SAT 91.0    CBC  Recent Labs Lab 06/11/14 0400 06/12/14 0400  HGB 10.0* 9.9*  HCT 33.4* 34.1*  WBC 6.8 6.6  PLT 159 195    COAGULATION No results for input(s): INR in the last 168 hours.  CARDIAC  No results for input(s): TROPONINI in the last 168 hours. No results for input(s): PROBNP in the last 168 hours.   CHEMISTRY  Recent Labs Lab 06/12/14 0400 06/13/14 0349 06-19-2014 0350 06/16/14 0500 06/17/14 0400  NA 137 135 141 137 137  K 4.1 4.5 3.7 3.3* 3.8  CL 96 95* 88* 80* 84*  CO2 34* 33* 45* 48* 43*  GLUCOSE 108* 116* 72 94 109*  BUN 16 18 18 15 14   CREATININE 1.37* 1.48* 1.32* 1.22* 1.30*  CALCIUM 8.8 8.9 8.8 8.5 8.5  MG  --   --  1.5 2.1 2.1  PHOS  --   --  2.4 2.2* 1.9*   Estimated Creatinine Clearance: 41.3 mL/min (by C-G formula based on Cr of 1.3).   LIVER No results for input(s): AST, ALT, ALKPHOS, BILITOT, PROT, ALBUMIN, INR in the last 168 hours.   INFECTIOUS No results for input(s): LATICACIDVEN, PROCALCITON in the last 168 hours.   ENDOCRINE CBG (last 3)   Recent Labs  06/17/14 1125 06/17/14 1638 06/17/14 1642  GLUCAP 154* 73 75         IMAGING x48h Dg Chest Port 1 View  06/16/2014   CLINICAL DATA:  Pulmonary edema.  EXAM: PORTABLE CHEST - 1 VIEW  COMPARISON:  06/14/2014.  FINDINGS: Right IJ line and left chest tube in stable position. Cardiomegaly. Pulmonary vascularity normal. Persistent bibasilar pulmonary infiltrates and bilateral pleural effusions. No pneumothorax. No acute osseous abnormality.  IMPRESSION: 1. Lines and tubes in stable position. 2. Persistent bibasilar pulmonary infiltrates and bilateral pleural effusions. Bibasilar pulmonary infiltrates could be related to pneumonia and/or pulmonary edema. 3. Persistent  severe cardiomegaly.   Electronically Signed   By: Marcello Moores  Register   On: 06/16/2014 07:33       ASSESSMENT / PLAN:  PULMONARY A: BAseline Chronic respiratory failure due to severe COPD (2-3L  At home) - sees MR  Current  Acute resp failure 12/29 - resolving with diuresis  RLL infiltrate vs ATX  - back t baseline 3L [place]  P:   Continue supplemental O2 to maintain SpO2 > 90%, on , ambulating Continue preadmission brovana, budesonide PRN Duoneb perciardial tube per cvts, remains given output Neg balance goals remain, keep same dose, she has done very well with this approach  CARDIOVASCULAR A:  Recurrent pericardial effusion, unclear etiology HTN, controlled  - improving pericardial drain P:  Post op managment per cardiology/TCTS - to water seal, drain to remain Hold anti-hypertensive meds - remains normal See renal  RENAL A:   AKI -improving UO with diuresis  Low phos on 06/17/14  P:   Replete K Phos Monitor BMET am Tolerated lasix well, keep same dosing,    GASTROINTESTINAL A:   GERD P:   SUP: PO protonix, keep as home med diet  HEMATOLOGIC A:   Anemia without overt bleedin P:  DVT px: SQ heparin Ambulate further , if successful could dc hep  INFECTIOUS A:   No acute issues P:   Monitor off ABX   ENDOCRINE A:  DM 2 P:   CBG monitoring SSI  Summary -much improved further, in chair, maintain lasix, dc NIMV completely, supp k , drain to remain with current output, ambulate further    Dr. Brand Males, M.D., [facility].C.P Pulmonary and Critical Care Medicine Staff Physician [place] [facility] Pager: [phone], If no answer or between  15:00h - 7:00h: call [phone]  06/17/2014 5:45 PM

## 2014-06-17 NOTE — Progress Notes (Addendum)
TCTS DAILY ICU PROGRESS NOTE                   [address]            [place] [address]          [phone]   9 Days Post-Op Procedure(s) (LRB): PERICARDIAL WINDOW (N/A) VIDEO ASSISTED THORACOSCOPY (VATS) (Left)  Total Length of Stay:  LOS: 10 days   Subjective: OOB in chair.  Denies dyspnea.  Weaned down to 3L O2.    Objective: Vital signs in last 24 hours: Temp:  [98 F (36.7 C)-98.9 F (37.2 C)] 98.6 F (37 C) (01/01 0711) Pulse Rate:  [83-97] 85 (01/01 0711) Cardiac Rhythm:  [-] Normal sinus rhythm (01/01 0400) Resp:  [12-28] 24 (01/01 0554) BP: (86-111)/(36-72) 97/51 mmHg (01/01 0711) SpO2:  [91 %-99 %] 97 % (01/01 0711)  Filed Weights   06/10/14 0600 06/11/14 0600 06/12/14 0600  Weight: 179 lb 4.8 oz (81.33 kg) 180 lb 12.4 oz (82 kg) 178 lb 12.7 oz (81.1 kg)    Weight change:       Intake/Output from previous day: 12/31 0701 - 01/01 0700 In: 270 [P.O.:270] Out: 605 [Urine:500; Chest Tube:105]      Current Meds: Scheduled Meds: . antiseptic oral rinse  7 mL Mouth Rinse BID  . arformoterol  15 mcg Nebulization BID  . bisacodyl  10 mg Oral Daily  . budesonide (PULMICORT) nebulizer solution  0.5 mg Nebulization BID  . colchicine  0.6 mg Oral BID  . enoxaparin (LOVENOX) injection  30 mg Subcutaneous Q24H  . insulin aspart  0-20 Units Subcutaneous TID WC  . ipratropium-albuterol  3 mL Nebulization Q6H  . multivitamin with minerals  1 tablet Oral Daily  . pantoprazole  40 mg Oral Daily  . senna-docusate  1 tablet Oral QHS  . sertraline  50 mg Oral QHS  . sodium chloride  10-40 mL Intracatheter Q12H   Continuous Infusions:  PRN Meds:.albuterol, ALPRAZolam, fentaNYL, ondansetron (ZOFRAN) IV, oxyCODONE, potassium chloride, sodium chloride, traMADol   Physical Exam: General appearance: alert, cooperative and no distress Heart: regular rate and rhythm Lungs: Few exp wheezes bilaterally with decreased BS in bases Extremities: No  edema Wound: Clean and dry    Lab Results: CBC:No results for input(s): WBC, HGB, HCT, PLT in the last 72 hours. BMET:  Recent Labs  06/16/14 0500 06/17/14 0400  NA 137 137  K 3.3* 3.8  CL 80* 84*  CO2 48* 43*  GLUCOSE 94 109*  BUN 15 14  CREATININE 1.22* 1.30*  CALCIUM 8.5 8.5    PT/INR: No results for input(s): LABPROT, INR in the last 72 hours. Radiology: Dg Chest Port 1 View  06/16/2014   CLINICAL DATA:  Pulmonary edema.  EXAM: PORTABLE CHEST - 1 VIEW  COMPARISON:  06/14/2014.  FINDINGS: Right IJ line and left chest tube in stable position. Cardiomegaly. Pulmonary vascularity normal. Persistent bibasilar pulmonary infiltrates and bilateral pleural effusions. No pneumothorax. No acute osseous abnormality.  IMPRESSION: 1. Lines and tubes in stable position. 2. Persistent bibasilar pulmonary infiltrates and bilateral pleural effusions. Bibasilar pulmonary infiltrates could be related to pneumonia and/or pulmonary edema. 3. Persistent severe cardiomegaly.   Electronically Signed   By: Marcello Moores  Register   On: 06/16/2014 07:33     Assessment/Plan: S/P Procedure(s) (LRB): PERICARDIAL WINDOW (N/A) VIDEO ASSISTED THORACOSCOPY (VATS) (Left) Pulmonary status much improved.Back on 3L, which she was evidently on at home. CXR not done this am.  Pericardial tube drainage continues to decrease. Will leave drain in until dry. Continue current care per primary service.  COLLINS,GINA H 06/17/2014 7:32 AM  I have seen and examined the patient and agree with the assessment and plan as outlined.  OWEN,CLARENCE H 06/17/2014 10:22 AM

## 2014-06-18 ENCOUNTER — Other Ambulatory Visit: Payer: Self-pay

## 2014-06-18 LAB — BASIC METABOLIC PANEL
Anion gap: 8 (ref 5–15)
BUN: 15 mg/dL (ref 6–23)
CALCIUM: 8.3 mg/dL — AB (ref 8.4–10.5)
CHLORIDE: 85 meq/L — AB (ref 96–112)
CO2: 42 mmol/L — AB (ref 19–32)
CREATININE: 1.35 mg/dL — AB (ref 0.50–1.10)
GFR calc Af Amer: 46 mL/min — ABNORMAL LOW (ref >90)
GFR, EST NON AFRICAN AMERICAN: 39 mL/min — AB (ref >90)
Glucose, Bld: 104 mg/dL — ABNORMAL HIGH (ref 70–99)
Potassium: 3.9 mmol/L (ref 3.5–5.1)
SODIUM: 135 mmol/L (ref 135–145)

## 2014-06-18 LAB — GLUCOSE, CAPILLARY
GLUCOSE-CAPILLARY: 213 mg/dL — AB (ref 70–99)
GLUCOSE-CAPILLARY: 91 mg/dL (ref 70–99)
Glucose-Capillary: 89 mg/dL (ref 70–99)
Glucose-Capillary: 117 mg/dL — ABNORMAL HIGH (ref 70–99)

## 2014-06-18 LAB — PHOSPHORUS: PHOSPHORUS: 3.9 mg/dL (ref 2.3–4.6)

## 2014-06-18 LAB — MAGNESIUM: Magnesium: 2.3 mg/dL (ref 1.5–2.5)

## 2014-06-18 MED ORDER — FUROSEMIDE 10 MG/ML IJ SOLN
40.0000 mg | Freq: Once | INTRAMUSCULAR | Status: AC
  Administered 2014-06-18: 40 mg via INTRAVENOUS
  Filled 2014-06-18: qty 4

## 2014-06-18 MED ORDER — IPRATROPIUM BROMIDE 0.02 % IN SOLN
0.5000 mg | Freq: Four times a day (QID) | RESPIRATORY_TRACT | Status: DC
  Administered 2014-06-18 – 2014-06-21 (×12): 0.5 mg via RESPIRATORY_TRACT
  Filled 2014-06-18 (×12): qty 2.5

## 2014-06-18 MED ORDER — ALBUTEROL SULFATE (5 MG/ML) 0.5% IN NEBU: 2.5000 mg | INHALATION_SOLUTION | Freq: Four times a day (QID) | RESPIRATORY_TRACT | Status: DC

## 2014-06-18 MED ORDER — LEVALBUTEROL HCL 0.63 MG/3ML IN NEBU
0.6300 mg | INHALATION_SOLUTION | Freq: Four times a day (QID) | RESPIRATORY_TRACT | Status: DC
  Administered 2014-06-18 – 2014-06-21 (×12): 0.63 mg via RESPIRATORY_TRACT
  Filled 2014-06-18 (×27): qty 3

## 2014-06-18 MED ORDER — DIGOXIN 0.25 MG/ML IJ SOLN
0.5000 mg | Freq: Once | INTRAMUSCULAR | Status: AC
  Administered 2014-06-18: 0.5 mg via INTRAVENOUS
  Filled 2014-06-18: qty 2

## 2014-06-18 NOTE — Progress Notes (Signed)
Ambulated 700 ft with patient. Began walking on 3L O2 but had to increase to 6L due to patient's O2 sats dropping to the upper 70's/low 80's. Patient also became tachycardic with a HR in the 140's. She denied SOB or any chest pain during our walk and stated she felt great. Pt's HR stabalized after sitting down and I was able to get her back on 3L. No complaints at this time.

## 2014-06-18 NOTE — Progress Notes (Addendum)
TCTS DAILY ICU PROGRESS NOTE                   [address]            [place] [address]          [phone]   10 Days Post-Op Procedure(s) (LRB): PERICARDIAL WINDOW (N/A) VIDEO ASSISTED THORACOSCOPY (VATS) (Left)  Total Length of Stay:  LOS: 11 days   Subjective: Had several episodes of PAF last night, nonsustained.  Remains in SR this am. Breathing stable, no new complaints.   Objective: Vital signs in last 24 hours: Temp:  [98.4 F (36.9 C)-99.5 F (37.5 C)] 99.5 F (37.5 C) (01/02 0417) Pulse Rate:  [81-100] 81 (01/02 0730) Cardiac Rhythm:  [-] Normal sinus rhythm (01/02 0730) Resp:  [21-27] 23 (01/02 0730) BP: (82-104)/(50-67) 104/67 mmHg (01/02 0730) SpO2:  [94 %-100 %] 95 % (01/02 0730)  Filed Weights   06/10/14 0600 06/11/14 0600 06/12/14 0600  Weight: 179 lb 4.8 oz (81.33 kg) 180 lb 12.4 oz (82 kg) 178 lb 12.7 oz (81.1 kg)    Weight change:    Hemodynamic parameters for last 24 hours:    Intake/Output from previous day: 01/01 0701 - 01/02 0700 In: 938 [P.O.:680; IV Piggyback:258] Out: 160 [Chest Tube:160]  Intake/Output this shift:    Current Meds: Scheduled Meds: . antiseptic oral rinse  7 mL Mouth Rinse BID  . arformoterol  15 mcg Nebulization BID  . bisacodyl  10 mg Oral Daily  . budesonide (PULMICORT) nebulizer solution  0.5 mg Nebulization BID  . colchicine  0.6 mg Oral BID  . enoxaparin (LOVENOX) injection  30 mg Subcutaneous Q24H  . insulin aspart  0-20 Units Subcutaneous TID WC  . ipratropium  0.5 mg Nebulization Q6H  . levalbuterol  0.63 mg Nebulization 4 times per day  . multivitamin with minerals  1 tablet Oral Daily  . pantoprazole  40 mg Oral Daily  . senna-docusate  1 tablet Oral QHS  . sertraline  50 mg Oral QHS  . sodium chloride  10-40 mL Intracatheter Q12H   Continuous Infusions:  PRN Meds:.ALPRAZolam, fentaNYL, ondansetron (ZOFRAN) IV, oxyCODONE, potassium chloride, sodium chloride, traMADol   Physical  Exam: General appearance: alert, cooperative and no distress Heart: regular rate and rhythm Lungs: Rare exp wheeze Wound: Clean and dry Chest tube: No air leak  Lab Results: CBC:No results for input(s): WBC, HGB, HCT, PLT in the last 72 hours. BMET:  Recent Labs  06/17/14 0400 06/18/14 0410  NA 137 135  K 3.8 3.9  CL 84* 85*  CO2 43* 42*  GLUCOSE 109* 104*  BUN 14 15  CREATININE 1.30* 1.35*  CALCIUM 8.5 8.3*    PT/INR: No results for input(s): LABPROT, INR in the last 72 hours. Radiology: No results found.   Assessment/Plan: S/P Procedure(s) (LRB): PERICARDIAL WINDOW (N/A) VIDEO ASSISTED THORACOSCOPY (VATS) (Left) Pericardial drain output 160 ml serous fluid over past 24 hours.  Will continue drain until it is dry. Medical issues per primary service.  COLLINS,GINA H 06/18/2014 7:43 AM  I have seen and examined the patient and agree with the assessment and plan as outlined.  Chest tube still draining some serous fluid.  Leave in place until dry.  Jazira Maloney H 06/18/2014 11:43 AM

## 2014-06-18 NOTE — Progress Notes (Signed)
PULMONARY / CRITICAL CARE MEDICINE   Name: [name] MRN: [id number] DOB: 1946/03/15    ADMISSION DATE:  06/07/2014 CONSULTATION DATE:  06/07/2014  REFERRING MD :  Dr. [name]  CHIEF COMPLAINT:  Lower extremity edema  INITIAL PRESENTATION: 69 year old female with recent admissions for pericardial effusions and recent pericardial window, presented to [facility] office 12/22 for f/u OV. She was noted to have severe lower extremity edema. Echo was ordered and showed a recurrence of effusion and early tamponade physiology. She was admitted to their service. PCCM to consult.    STUDIES:  Spirometry Nov 2012: Fev1 0.85L/45% 2D Echo 12/22 >  Mod LVH, LVEF 55-60%, Grade 1 DD. PA peak pressure 75 mmHg. Large pericardial effusion, c/w early tamponade.   SIGNIFICANT EVENTS: 12/23 Pericardial window 12/24 Passed SBT and extubated June 16, 2023- near death from resp failure, NIMV   12/30- much improved 06/16/14: In chair, no distress again today, ambulated well   SUBJECTIVE/OVERNIGHT/INTERVAL HX 06/18/14: AFib overnight, rate controlled  Pericardial drain cont w/ serous output  Decreased dsypnea, OOB to chair and walked in hall this am    VITAL SIGNS: Temp:  [98.2 F (36.8 C)-99.5 F (37.5 C)] 98.2 F (36.8 C) (01/02 0730) Pulse Rate:  [81-100] 94 (01/02 1015) Resp:  [21-27] 22 (01/02 1015) BP: (82-104)/(50-67) 95/59 mmHg (01/02 1015) SpO2:  [91 %-100 %] 91 % (01/02 1015) HEMODYNAMICS:   VENTILATOR SETTINGS:   INTAKE / OUTPUT:  Intake/Output Summary (Last 24 hours) at 06/18/14 1037 Last data filed at 06/18/14 0900  Gross per 24 hour  Intake   1178 ml  Output    160 ml  Net   1018 ml    PHYSICAL EXAMINATION: General:no distres , looks good HEENT: wnl, jvd down PULM: reduced, bases CV: s1 s2 Reg, no M CHest: pericardial drain +  AB: BS+, soft, nontender Ext: warm, trace reduced+ edema,  Neuro Alert this am , moves all ext, follows  commands   LABS: PULMONARY  Recent Labs Lab Jun 15, 2014 0845  PHART 7.265*  PCO2ART 89.3*  PO2ART 74.0*  HCO3 40.6*  TCO2 43  O2SAT 91.0    CBC  Recent Labs Lab 06/12/14 0400  HGB 9.9*  HCT 34.1*  WBC 6.6  PLT 195    COAGULATION No results for input(s): INR in the last 168 hours.  CARDIAC  No results for input(s): TROPONINI in the last 168 hours. No results for input(s): PROBNP in the last 168 hours.   CHEMISTRY  Recent Labs Lab 06/13/14 0349 06/15/14 0350 06/16/14 0500 06/17/14 0400 06/18/14 0410  NA 135 141 137 137 135  K 4.5 3.7 3.3* 3.8 3.9  CL 95* 88* 80* 84* 85*  CO2 33* 45* 48* 43* 42*  GLUCOSE 116* 72 94 109* 104*  BUN 18 18 15 14 15   CREATININE 1.48* 1.32* 1.22* 1.30* 1.35*  CALCIUM 8.9 8.8 8.5 8.5 8.3*  MG  --  1.5 2.1 2.1 2.3  PHOS  --  2.4 2.2* 1.9* 3.9   Estimated Creatinine Clearance: 39.8 mL/min (by C-G formula based on Cr of 1.35).   LIVER No results for input(s): AST, ALT, ALKPHOS, BILITOT, PROT, ALBUMIN, INR in the last 168 hours.   INFECTIOUS No results for input(s): LATICACIDVEN, PROCALCITON in the last 168 hours.   ENDOCRINE CBG (last 3)   Recent Labs  06/17/14 1642 06/17/14 2114 06/18/14 0747  GLUCAP 75 197* 89         IMAGING x48h No results found.  ASSESSMENT / PLAN:  PULMONARY A: BAseline Chronic respiratory failure due to severe COPD (2-3L  At home) -  MR pt   Current  Acute resp failure 12/29 - resolving with diuresis  RLL infiltrate vs ATX  - back t baseline 3L [place]  P:   Continue supplemental O2 to maintain SpO2 > 90%, on [place], ambulating Continue preadmission brovana, budesonide PRN Duoneb perciardial tube per cvts, remains given output Neg balance goal Check cxr in am   CARDIOVASCULAR A:  Recurrent pericardial effusion, unclear etiology HTN, controlled New Onset Atrial Fib 1/2 s/p dig >back in NSR this am   - improving pericardial drain P:  Post op managment per  cardiology/TCTS - to water seal, drain to remain Hold anti-hypertensive meds - remains normal See renal No heparin  with pericardial drain     RENAL A:   AKI     P:   Monitor BMET am Restart Lasix 40mg  x 1 today    GASTROINTESTINAL A:   GERD P:   SUP: PO protonix, keep as home med diet  HEMATOLOGIC A:   Anemia without overt bleedin P:  DVT px: SQ heparin Ambulate further , if successful could dc hep  INFECTIOUS A:   No acute issues P:   Monitor off ABX   ENDOCRINE A:  DM 2 P:   CBG monitoring SSI  Ciera Beckum NP-C   Pulmonary and Critical Care  [phone]    06/18/2014 10:37 AM

## 2014-06-18 NOTE — Clinical Social Work Psychosocial (Addendum)
Clinical Social Work Department BRIEF PSYCHOSOCIAL ASSESSMENT 06/17/2014  Patient:  PROMISE, WELDIN     Account Number:  [id number]     Normal date:  06/07/2014  Clinical Social Worker:  Elam Dutch  Date/Time:  06/17/2014 02:23 PM  Referred by:  Physician  Date Referred:  06/15/2014 Referred for  Other - See comment   Other Referral:   Return to SNF   Interview type:  Patient Other interview type:    PSYCHOSOCIAL DATA Living Status:  FACILITY Admitted from facility:  [facility], [place] Level of care:  [place] Primary support name:  Bertram Savin   [phone] Primary support relationship to patient:  CHILD, ADULT Degree of support available:   Strong support per patient    CURRENT CONCERNS Current Concerns  Other - See comment   Other Concerns:   Return to SNF    SOCIAL WORK ASSESSMENT / PLAN 69 year old female- resident of [facility].  CSW met with patient this afternoon.  She was sitting up in a chair and getting ready to walk with nursing staff. Patient stated that she has been staying at the SNF for short term SNF only and plans to return home as soon as she is strong enough.  She admits that she has been feeling week with this hospitalization. CSW has confirmed with Gayla Medicus, RN Li[facility] for [facility] that patient can return to facility when medicaly stable. Fl2 has been completed by Evette Cristal, LCSWA and placed on chart for MD's signature.   Assessment/plan status:  Psychosocial Support/Ongoing Assessment of Needs Other assessment/ plan:   Information/referral to community resources:   None at this time    PATIENT'S/FAMILY'S RESPONSE TO PLAN OF CARE: Patient is alert and oriented; very pleasant lady. She speaks of her strong desire to return to her home and an independent level of care; understands that she needs to return to SNF for further rehab at d/c from the hospital. Patient  states that she manages her own affairs but has good family support  CSW will monitor and assist with d/c to SNF when medically stable.   Lorie Phenix. [place], [place]

## 2014-06-18 NOTE — Clinical Social Work Note (Signed)
CSW has updated [facility] on patient's conditioned.   Liz Beach MSW, [place], [place], [phone]

## 2014-06-18 NOTE — Progress Notes (Addendum)
eLink Physician-Brief Progress Note Patient Name: KEITHA KOLK DOB: 1946-05-15 MRN: [id number]   Date of Service  06/18/2014  HPI/Events of Note  New fib , intermittent rvr, has been on lasix higher dose and albuterla NO heparin with pericardial drain etc  eICU Interventions  Send lytes Dig 0.5 iv x 1 No distress Change to xopenex     Intervention Category Major Interventions: Arrhythmia - evaluation and management  Dillinger Aston J. 06/18/2014, 3:55 AM

## 2014-06-18 NOTE — Progress Notes (Signed)
Patient heart rate increased to the 140's, had patient cough and the heart rate returned to 90's.   About 20 minutes later, heart rate increased ranging from 140 to 170's. Patient asymptomatic. EKG performed and CCM called, talked with nurse who stated would report to MD. New orders received. Vicie Mutters, RN

## 2014-06-19 ENCOUNTER — Inpatient Hospital Stay (HOSPITAL_COMMUNITY): Payer: Medicare Other

## 2014-06-19 LAB — BASIC METABOLIC PANEL
ANION GAP: 0 — AB (ref 5–15)
BUN: 18 mg/dL (ref 6–23)
CHLORIDE: 87 meq/L — AB (ref 96–112)
CO2: 48 mmol/L (ref 19–32)
CREATININE: 1.31 mg/dL — AB (ref 0.50–1.10)
Calcium: 8.2 mg/dL — ABNORMAL LOW (ref 8.4–10.5)
GFR calc Af Amer: 47 mL/min — ABNORMAL LOW (ref >90)
GFR calc non Af Amer: 41 mL/min — ABNORMAL LOW (ref >90)
Glucose, Bld: 97 mg/dL (ref 70–99)
Potassium: 4 mmol/L (ref 3.5–5.1)
SODIUM: 134 mmol/L — AB (ref 135–145)

## 2014-06-19 LAB — GLUCOSE, CAPILLARY
GLUCOSE-CAPILLARY: 92 mg/dL (ref 70–99)
GLUCOSE-CAPILLARY: 115 mg/dL — AB (ref 70–99)
Glucose-Capillary: 90 mg/dL (ref 70–99)
Glucose-Capillary: 129 mg/dL — ABNORMAL HIGH (ref 70–99)

## 2014-06-19 MED ORDER — GUAIFENESIN ER 600 MG PO TB12: 600.0000 mg | ORAL_TABLET | Freq: Two times a day (BID) | ORAL | Status: DC

## 2014-06-19 MED ORDER — GUAIFENESIN 100 MG/5ML PO SYRP
200.0000 mg | ORAL_SOLUTION | ORAL | Status: DC | PRN
  Administered 2014-06-19 – 2014-06-20 (×5): 200 mg via ORAL
  Filled 2014-06-19 (×9): qty 10

## 2014-06-19 MED ORDER — FUROSEMIDE 10 MG/ML IJ SOLN
40.0000 mg | Freq: Once | INTRAMUSCULAR | Status: AC
  Administered 2014-06-19: 40 mg via INTRAVENOUS
  Filled 2014-06-19: qty 4

## 2014-06-19 NOTE — Progress Notes (Signed)
PULMONARY / CRITICAL CARE MEDICINE   Name: [name] MRN: [id number] DOB: 09-10-1945    ADMISSION DATE:  06/07/2014 CONSULTATION DATE:  06/07/2014  REFERRING MD :  Dr. [name]  CHIEF COMPLAINT:  Lower extremity edema  INITIAL PRESENTATION: 69 year old female with recent admissions for pericardial effusions and recent pericardial window, presented to [place] office 12/22 for f/u OV. She was noted to have severe lower extremity edema. Echo was ordered and showed a recurrence of effusion and early tamponade physiology. She was admitted to their service. PCCM to consult.    STUDIES:  Spirometry Nov 2012: Fev1 0.85L/45% 2D Echo 12/22 >  Mod LVH, LVEF 55-60%, Grade 1 DD. PA peak pressure 75 mmHg. Large pericardial effusion, c/w early tamponade.   SIGNIFICANT EVENTS: 12/23 Pericardial window 12/24 Passed SBT and extubated Jun 26, 2023- near death from resp failure, NIMV   12/30- much improved 06/16/14: In chair, no distress again today, ambulated well   SUBJECTIVE/OVERNIGHT/INTERVAL HX 06/19/14: Pericardial drain cont w/ serous output  Cont to have DOE , OOB to chair ,  Wears out easily    VITAL SIGNS: Temp:  [98 F (36.7 C)-99.1 F (37.3 C)] 98.4 F (36.9 C) (01/03 1217) Pulse Rate:  [82-96] 83 (01/03 0800) Resp:  [17-29] 20 (01/03 1217) BP: (86-115)/(45-64) 90/59 mmHg (01/03 1217) SpO2:  [94 %-100 %] 95 % (01/03 1217) Weight:  [87 kg (191 lb 12.8 oz)] 87 kg (191 lb 12.8 oz) (01/03 0400) HEMODYNAMICS:   VENTILATOR SETTINGS:   INTAKE / OUTPUT:  Intake/Output Summary (Last 24 hours) at 06/19/14 1520 Last data filed at 06/19/14 0900  Gross per 24 hour  Intake    840 ml  Output    150 ml  Net    690 ml    PHYSICAL EXAMINATION: General:no distres , looks good HEENT: wnl, jvd down PULM: reduced, bases CV: s1 s2 Reg, no M CHest: pericardial drain +  AB: BS+, soft, nontender Ext: warm, trace reduced+ edema,  Neuro Alert this am , moves all ext, follows  commands   LABS: PULMONARY  Recent Labs Lab 06-25-2014 0845  PHART 7.265*  PCO2ART 89.3*  PO2ART 74.0*  HCO3 40.6*  TCO2 43  O2SAT 91.0    CBC No results for input(s): HGB, HCT, WBC, PLT in the last 168 hours.  COAGULATION No results for input(s): INR in the last 168 hours.  CARDIAC  No results for input(s): TROPONINI in the last 168 hours. No results for input(s): PROBNP in the last 168 hours.   CHEMISTRY  Recent Labs Lab 06/15/14 0350 06/16/14 0500 06/17/14 0400 06/18/14 0410 06/19/14 0500  NA 141 137 137 135 134*  K 3.7 3.3* 3.8 3.9 4.0  CL 88* 80* 84* 85* 87*  CO2 45* 48* 43* 42* 48*  GLUCOSE 72 94 109* 104* 97  BUN 18 15 14 15 18   CREATININE 1.32* 1.22* 1.30* 1.35* 1.31*  CALCIUM 8.8 8.5 8.5 8.3* 8.2*  MG 1.5 2.1 2.1 2.3  --   PHOS 2.4 2.2* 1.9* 3.9  --    Estimated Creatinine Clearance: 42.6 mL/min (by C-G formula based on Cr of 1.31).   LIVER No results for input(s): AST, ALT, ALKPHOS, BILITOT, PROT, ALBUMIN, INR in the last 168 hours.   INFECTIOUS No results for input(s): LATICACIDVEN, PROCALCITON in the last 168 hours.   ENDOCRINE CBG (last 3)   Recent Labs  06/18/14 2153 06/19/14 0742 06/19/14 1216  GLUCAP 91 90 129*  IMAGING x48h Dg Chest Port 1 View  06/19/2014   CLINICAL DATA:  Acute respiratory failure. Shortness of breath. COPD.  EXAM: PORTABLE CHEST - 1 VIEW  COMPARISON:  06/16/2014  FINDINGS: Support apparatus: Right internal jugular line tip at SVC. Left chest tube unchanged in position.  Cardiomediastinal silhouette: Midline trachea. Moderate to marked enlargement of the cardiopericardial silhouette. Transverse aortic atherosclerosis.  Pleura: Small right pleural effusion is slightly decreased. A small left pleural effusion is similar. No pneumothorax.  Lungs: Mild interstitial edema. Improvement in right base airspace disease. Slight worsening left base airspace disease.  Other: None  IMPRESSION: Improvement in mild  interstitial edema.  Similar small bilateral pleural effusions.  Shifting bibasilar airspace disease which could represent atelectasis or infection.   Electronically Signed   By: Abigail Miyamoto M.D.   On: 06/19/2014 08:18       ASSESSMENT / PLAN:  PULMONARY A: [place] Chronic hypoxic /hypercarbic respiratory failure due to severe COPD (2-3L  At home) -  MR pt   Current  Acute resp failure 12/29 - improving with diuresis  RLL infiltrate vs ATX  - back t baseline 3L [place]  P:   Continue supplemental O2 to maintain SpO2 > 90%, on [place] Continue preadmission brovana, budesonide perciardial tube per cvts, remains given output Neg balance goal Check cxr in am  Diuresis as b/p and scr allow - Lasix 40mg  x 1 today     CARDIOVASCULAR A:  Recurrent pericardial effusion, unclear etiology HTN, controlled New Onset Atrial Fib 1/2 s/p dig >back in NSR this am   - improving pericardial drain P:  Post op managment per cardiology/TCTS - to water seal, drain to remain Hold anti-hypertensive meds - remains normal See renal No heparin  with pericardial drain     RENAL A:   AKI  P:   Monitor BMET am  GASTROINTESTINAL A:   GERD P:   SUP: PO protonix, keep as home med diet  HEMATOLOGIC A:   Anemia without overt bleedin P:  DVT px: SQ lovenox    INFECTIOUS A:   No acute issues P:   Monitor off ABX   ENDOCRINE A:  DM 2 P:   CBG monitoring SSI  Tammy Parrett NP-C   Pulmonary and Critical Care  [phone]    06/19/2014 3:20 PM

## 2014-06-19 NOTE — Progress Notes (Addendum)
TCTS DAILY ICU PROGRESS NOTE                   [name]            [name]          [phone]   11 Days Post-Op Procedure(s) (LRB): PERICARDIAL WINDOW (N/A) VIDEO ASSISTED THORACOSCOPY (VATS) (Left)  Total Length of Stay:  LOS: 12 days   Subjective: Breathing stable except desats with exertion.  No new issues.  Remains in SR this am.   Objective: Vital signs in last 24 hours: Temp:  [98 F (36.7 C)-99 F (37.2 C)] 98 F (36.7 C) (01/03 0400) Pulse Rate:  [83-97] 84 (01/03 0600) Cardiac Rhythm:  [-] Normal sinus rhythm (01/03 0400) Resp:  [17-29] 19 (01/03 0600) BP: (74-115)/(42-66) 115/64 mmHg (01/03 0600) SpO2:  [90 %-100 %] 98 % (01/03 0600) Weight:  [191 lb 12.8 oz (87 kg)] 191 lb 12.8 oz (87 kg) (01/03 0400)  Filed Weights   06/11/14 0600 06/12/14 0600 06/19/14 0400  Weight: 180 lb 12.4 oz (82 kg) 178 lb 12.7 oz (81.1 kg) 191 lb 12.8 oz (87 kg)    Weight change:    Hemodynamic parameters for last 24 hours:    Intake/Output from previous day: 01/02 0701 - 01/03 0700 In: 960 [P.O.:960] Out: 240 [Chest Tube:240]  Intake/Output this shift:    Current Meds: Scheduled Meds: . antiseptic oral rinse  7 mL Mouth Rinse BID  . arformoterol  15 mcg Nebulization BID  . bisacodyl  10 mg Oral Daily  . budesonide (PULMICORT) nebulizer solution  0.5 mg Nebulization BID  . colchicine  0.6 mg Oral BID  . enoxaparin (LOVENOX) injection  30 mg Subcutaneous Q24H  . insulin aspart  0-20 Units Subcutaneous TID WC  . ipratropium  0.5 mg Nebulization Q6H  . levalbuterol  0.63 mg Nebulization 4 times per day  . multivitamin with minerals  1 tablet Oral Daily  . pantoprazole  40 mg Oral Daily  . senna-docusate  1 tablet Oral QHS  . sertraline  50 mg Oral QHS  . sodium chloride  10-40 mL Intracatheter Q12H   Continuous Infusions:  PRN Meds:.ALPRAZolam, fentaNYL, ondansetron (ZOFRAN) IV, oxyCODONE, potassium chloride, sodium chloride,  traMADol   Physical Exam: General appearance: alert, cooperative and no distress Heart: regular rate and rhythm Lungs: diminished breath sounds bilaterally Wound: Clean and dry Chest tube: No air leak  Lab Results: CBC:No results for input(s): WBC, HGB, HCT, PLT in the last 72 hours. BMET:  Recent Labs  06/18/14 0410 06/19/14 0500  NA 135 134*  K 3.9 4.0  CL 85* 87*  CO2 42* 48*  GLUCOSE 104* 97  BUN 15 18  CREATININE 1.35* 1.31*  CALCIUM 8.3* 8.2*    PT/INR: No results for input(s): LABPROT, INR in the last 72 hours.  CXR: stable   Assessment/Plan: S/P Procedure(s) (LRB): PERICARDIAL WINDOW (N/A) VIDEO ASSISTED THORACOSCOPY (VATS) (Left) Continues to have increased serous drainage from pericardial tube, 240 ml documented over past 24 hours.  Will leave drain until dry. Medical issues per primary service.  [name],[name] 06/19/2014 7:43 AM  I have seen and examined the patient and agree with the assessment and plan as outlined.  Chest tube Pleur-Evac marked.  [name] [name] 06/19/2014 11:31 AM

## 2014-06-20 ENCOUNTER — Inpatient Hospital Stay (HOSPITAL_COMMUNITY): Payer: Medicare Other

## 2014-06-20 LAB — GLUCOSE, CAPILLARY
GLUCOSE-CAPILLARY: 94 mg/dL (ref 70–99)
GLUCOSE-CAPILLARY: 102 mg/dL — AB (ref 70–99)
Glucose-Capillary: 94 mg/dL (ref 70–99)
Glucose-Capillary: 93 mg/dL (ref 70–99)
Glucose-Capillary: 149 mg/dL — ABNORMAL HIGH (ref 70–99)

## 2014-06-20 LAB — CBC
HEMATOCRIT: 28.5 % — AB (ref 36.0–46.0)
Hemoglobin: 8.5 g/dL — ABNORMAL LOW (ref 12.0–15.0)
MCH: 27.1 pg (ref 26.0–34.0)
MCHC: 29.8 g/dL — ABNORMAL LOW (ref 30.0–36.0)
MCV: 90.8 fL (ref 78.0–100.0)
Platelets: 329 10*3/uL (ref 150–400)
RBC: 3.14 MIL/uL — ABNORMAL LOW (ref 3.87–5.11)
RDW: 16.6 % — ABNORMAL HIGH (ref 11.5–15.5)
WBC: 4 10*3/uL (ref 4.0–10.5)

## 2014-06-20 LAB — BASIC METABOLIC PANEL
Anion gap: 5 (ref 5–15)
BUN: 19 mg/dL (ref 6–23)
CALCIUM: 8.4 mg/dL (ref 8.4–10.5)
CO2: 44 mmol/L (ref 19–32)
Chloride: 88 mEq/L — ABNORMAL LOW (ref 96–112)
Creatinine, Ser: 1.34 mg/dL — ABNORMAL HIGH (ref 0.50–1.10)
GFR, EST AFRICAN AMERICAN: 46 mL/min — AB (ref >90)
GFR, EST NON AFRICAN AMERICAN: 40 mL/min — AB (ref >90)
Glucose, Bld: 98 mg/dL (ref 70–99)
Potassium: 3.9 mmol/L (ref 3.5–5.1)
Sodium: 137 mmol/L (ref 135–145)

## 2014-06-20 LAB — BRAIN NATRIURETIC PEPTIDE: B Natriuretic Peptide: 120.1 pg/mL — ABNORMAL HIGH (ref 0.0–100.0)

## 2014-06-20 NOTE — Progress Notes (Signed)
Pt's HR jumped up to 140's-150's. Pt denies any chest pain or palpitations. After several minutes HR went back to 90's. CCM called and made aware. Will continue to monitor.

## 2014-06-20 NOTE — Progress Notes (Signed)
NAD Very comfortable up in chair Pericardial tube still draining fluid  Filed Vitals:   06/20/14 1200 06/20/14 1208 06/20/14 1600 06/20/14 1628  BP: 98/59 98/59 98/60  98/60  Pulse:      Temp:  98.6 F (37 C)  98.4 F (36.9 C)  TempSrc:  Oral  Oral  Resp: 18 18 18 18   Height:      Weight:      SpO2: 96% 96% 95% 95%   NAD No JVD noted Chest clear without wheezes RRR NABS Ext warm, no edema  I have reviewed all of today's lab results. Relevant abnormalities are discussed in the A/P section  CXR: Vascular congestion, LLL ATX  IMPRESSION: COPD with chronic O2 dependence Recurrent pericardial effusion PAF > NSR now AKI - improving DM 2, controlled  PLAN: Cont current rx Mgmt of pericardial tube per TCTS  Merton Border, MD ; [facility] [phone].  After 5:30 PM or weekends, call [phone]

## 2014-06-20 NOTE — Progress Notes (Addendum)
      [address]       [name] [address]             [phone]      12 Days Post-Op Procedure(s) (LRB): PERICARDIAL WINDOW (N/A) VIDEO ASSISTED THORACOSCOPY (VATS) (Left)   Subjective:  Ms. [name] complains she is tired this morning.  Objective: Vital signs in last 24 hours: Temp:  [98.4 F (36.9 C)-99.2 F (37.3 C)] 98.4 F (36.9 C) (01/04 0758) Pulse Rate:  [82-99] 82 (01/04 0400) Cardiac Rhythm:  [-] Normal sinus rhythm (01/04 0800) Resp:  [16-20] 16 (01/04 0800) BP: (90-109)/(47-60) 98/53 mmHg (01/04 0800) SpO2:  [94 %-97 %] 97 % (01/04 0800)  Intake/Output from previous day: 01/03 0701 - 01/04 0700 In: 1080 [P.O.:1080] Out: 460 [Chest Tube:110] Intake/Output this shift: Total I/O In: 360 [P.O.:360] Out: -   General appearance: alert, cooperative and no distress Heart: regular rate and rhythm Lungs: Diminshed right base Wound: clean   Lab Results:  Recent Labs  06/20/14 0500  WBC 4.0  HGB 8.5*  HCT 28.5*  PLT 329   BMET:  Recent Labs  06/19/14 0500 06/20/14 0500  NA 134* 137  K 4.0 3.9  CL 87* 88*  CO2 48* 44*  GLUCOSE 97 98  BUN 18 19  CREATININE 1.31* 1.34*  CALCIUM 8.2* 8.4    PT/INR: No results for input(s): LABPROT, INR in the last 72 hours. ABG    Component Value Date/Time   PHART 7.265* 06/14/2014 0845   HCO3 40.6* 06/14/2014 0845   TCO2 43 06/14/2014 0845   O2SAT 91.0 06/14/2014 0845   CBG (last 3)   Recent Labs  06/19/14 1630 06/19/14 2113 06/20/14 0800  GLUCAP 92 115* 93    Assessment/Plan: S/P Procedure(s) (LRB): PERICARDIAL WINDOW (N/A) VIDEO ASSISTED THORACOSCOPY (VATS) (Left)  1. Pericardial drain remains in place- 230 cc output last 24 hours, CXR stable 2. Leave drain in place, repeat CXR in AM   LOS: 13 days    BARRETT, ERIN 06/20/2014  Patient seen and examined, agree with above She continues to drain about 200 ml/day- some of this may be pleural as the tube does traverse  the pleural space. However, we need the drainage to come down before we can safely remove the tube It is currently at 1500 ml at 3:50 PM

## 2014-06-20 NOTE — Progress Notes (Signed)
Physical Therapy Treatment Patient Details Name: [name] MRN: [id number] DOB: November 30, 1945 Today's Date: 06-21-2014    History of Present Illness 69 year old female with recent admissions for pericardial effusions and recent pericardial window, presented to [place] office 12/22 for f/u OV. She was noted to have severe lower extremity edema. Echo was ordered and showed a recurrence of effusion and early tamponade physiology. S/p pericardial window 12/23, extubated 12/24.    PT Comments    Pt making excellent progress.  Follow Up Recommendations  SNF     Equipment Recommendations  None recommended by PT    Recommendations for Other Services       Precautions / Restrictions Precautions Precautions: Fall    Mobility  Bed Mobility Overal bed mobility: Modified Independent       Supine to sit: Modified independent (Device/Increase time)        Transfers Overall transfer level: Needs assistance Equipment used: Pushed w/c Transfers: Sit to/from [employer] Transfers Sit to Stand: Supervision Stand pivot transfers: Supervision          Ambulation/Gait Ambulation/Gait assistance: Supervision Ambulation Distance (Feet): 700 Feet Assistive device:  (pushing w/c) Gait Pattern/deviations: Step-through pattern;Decreased stride length Gait velocity: decreased   General Gait Details: 1 standing rest break due to dyspnea   Stairs            Wheelchair Mobility    Modified Rankin (Stroke Patients Only)       Balance   Sitting-balance support: No upper extremity supported;Feet supported Sitting balance-Leahy Scale: Normal     Standing balance support: No upper extremity supported Standing balance-Leahy Scale: Fair                      Cognition Arousal/Alertness: Awake/alert Behavior During Therapy: WFL for tasks assessed/performed Overall Cognitive Status: Within Functional Limits for tasks assessed                       Exercises      General Comments        Pertinent Vitals/Pain Pain Assessment: No/denies pain    Home Living                      Prior Function            PT Goals (current goals can now be found in the care plan section) Progress towards PT goals: Progressing toward goals    Frequency  Min 2X/week    PT Plan Current plan remains appropriate    Co-evaluation             End of Session Equipment Utilized During Treatment: Oxygen Activity Tolerance: Patient tolerated treatment well Patient left: in chair;with call bell/phone within reach     Time: 1001-1024 PT Time Calculation (min) (ACUTE ONLY): 23 min  Charges:  $Gait Training: 23-37 mins                    G Codes:      Makel Mcmann Jun 21, 2014, 10:32 AM  Suanne Marker PT [phone]

## 2014-06-21 ENCOUNTER — Inpatient Hospital Stay (HOSPITAL_COMMUNITY): Payer: Medicare Other

## 2014-06-21 LAB — GLUCOSE, CAPILLARY
GLUCOSE-CAPILLARY: 178 mg/dL — AB (ref 70–99)
Glucose-Capillary: 85 mg/dL (ref 70–99)
Glucose-Capillary: 53 mg/dL — ABNORMAL LOW (ref 70–99)
Glucose-Capillary: 64 mg/dL — ABNORMAL LOW (ref 70–99)
Glucose-Capillary: 86 mg/dL (ref 70–99)
Glucose-Capillary: 130 mg/dL — ABNORMAL HIGH (ref 70–99)

## 2014-06-21 MED ORDER — GUAIFENESIN 100 MG/5ML PO SYRP
200.0000 mg | ORAL_SOLUTION | ORAL | Status: AC | PRN
  Administered 2014-06-21 – 2014-06-22 (×2): 200 mg via ORAL
  Filled 2014-06-21 (×3): qty 10

## 2014-06-21 MED ORDER — INSULIN ASPART 100 UNIT/ML ~~LOC~~ SOLN
0.0000 [IU] | Freq: Three times a day (TID) | SUBCUTANEOUS | Status: DC
  Administered 2014-06-21: 3 [IU] via SUBCUTANEOUS
  Administered 2014-06-22 – 2014-06-23 (×3): 2 [IU] via SUBCUTANEOUS
  Administered 2014-06-23: 3 [IU] via SUBCUTANEOUS

## 2014-06-21 MED ORDER — BUDESONIDE 0.25 MG/2ML IN SUSP
0.2500 mg | Freq: Two times a day (BID) | RESPIRATORY_TRACT | Status: DC
  Administered 2014-06-21 – 2014-06-24 (×6): 0.25 mg via RESPIRATORY_TRACT
  Filled 2014-06-21 (×8): qty 2

## 2014-06-21 MED ORDER — TIOTROPIUM BROMIDE MONOHYDRATE 18 MCG IN CAPS
18.0000 ug | ORAL_CAPSULE | Freq: Every day | RESPIRATORY_TRACT | Status: DC
  Administered 2014-06-22 – 2014-06-24 (×3): 18 ug via RESPIRATORY_TRACT
  Filled 2014-06-21: qty 5

## 2014-06-21 MED ORDER — INSULIN ASPART 100 UNIT/ML ~~LOC~~ SOLN: 0.0000 [IU] | Freq: Every day | SUBCUTANEOUS | Status: DC

## 2014-06-21 MED ORDER — LEVALBUTEROL HCL 0.63 MG/3ML IN NEBU: 0.6300 mg | INHALATION_SOLUTION | Freq: Four times a day (QID) | RESPIRATORY_TRACT | Status: DC | PRN

## 2014-06-21 MED ORDER — FENTANYL CITRATE 0.05 MG/ML IJ SOLN: 25.0000 ug | INTRAMUSCULAR | Status: DC | PRN

## 2014-06-21 NOTE — Progress Notes (Addendum)
      [address]       [place] [address]             [phone]      13 Days Post-Op Procedure(s) (LRB): PERICARDIAL WINDOW (N/A) VIDEO ASSISTED THORACOSCOPY (VATS) (Left)   Subjective:  No new complaints.  Objective: Vital signs in last 24 hours: Temp:  [98 F (36.7 C)-98.6 F (37 C)] 98 F (36.7 C) (01/05 0805) Pulse Rate:  [82] 82 (01/05 0258) Cardiac Rhythm:  [-] Normal sinus rhythm (01/05 0800) Resp:  [18] 18 (01/05 0805) BP: (98-110)/(59-75) 110/75 mmHg (01/05 0805) SpO2:  [95 %-96 %] 96 % (01/05 0805)  Intake/Output from previous day: 01/04 0701 - 01/05 0700 In: 840 [P.O.:840] Out: 1035 [Urine:1000; Chest Tube:35]  General appearance: alert, cooperative and no distress Heart: regular rate and rhythm Lungs: clear to auscultation bilaterally Wound: clean and dry  Lab Results:  Recent Labs  06/20/14 0500  WBC 4.0  HGB 8.5*  HCT 28.5*  PLT 329   BMET:  Recent Labs  06/19/14 0500 06/20/14 0500  NA 134* 137  K 4.0 3.9  CL 87* 88*  CO2 48* 44*  GLUCOSE 97 98  BUN 18 19  CREATININE 1.31* 1.34*  CALCIUM 8.2* 8.4    PT/INR: No results for input(s): LABPROT, INR in the last 72 hours. ABG    Component Value Date/Time   PHART 7.265* 06/14/2014 0845   HCO3 40.6* 06/14/2014 0845   TCO2 43 06/14/2014 0845   O2SAT 91.0 06/14/2014 0845   CBG (last 3)   Recent Labs  06/20/14 1627 06/20/14 2136 06/21/14 0804  GLUCAP 102* 149* 85    Assessment/Plan: S/P Procedure(s) (LRB): PERICARDIAL WINDOW (N/A) VIDEO ASSISTED THORACOSCOPY (VATS) (Left)  1. Pericardial drain- output decreased from yesterday, 120cc output in last 24 hours (1550), 50 cc since Dr. [name] evaluated patient yesterday. Will leave tube in today if continues to decrease can hopefully d/c soon.    LOS: 14 days    BARRETT, ERIN 06/21/2014  Patient seen and examined, agree with above 50 ml out since yesterday afternoon Now at 1550 ml in  pleuravac Hopefully can dc tube tomorrow

## 2014-06-21 NOTE — Progress Notes (Signed)
NAD. Pericardial tube drainage decreased  Filed Vitals:   06/21/14 0800 06/21/14 0805 06/21/14 1200 06/21/14 1645  BP: 110/75 110/75 106/61 114/84  Pulse:      Temp:  98 F (36.7 C) 98.7 F (37.1 C) 98.2 F (36.8 C)  TempSrc:  Oral Oral Oral  Resp:  18 17 16   Height:      Weight:      SpO2:  96% 98% 95%   NAD No JVD noted Chest clear without wheezes RRR NABS Ext warm, no edema  I have reviewed all of today's lab results. Relevant abnormalities are discussed in the A/P section  CXR: [place]  IMPRESSION: COPD with chronic O2 dependence Recurrent pericardial effusion, s/p window and drain PAF > NSR now AKI - improving DM 2, controlled  PLAN: Cont current rx Mgmt of pericardial tube per TCTS DC CVL 01/05  Merton Border, MD ; [facility] service Mobile [phone].  After 5:30 PM or weekends, call [phone]

## 2014-06-21 NOTE — Progress Notes (Signed)
Hypoglycemic Event  CBG: 64  Treatment: 15 GM carbohydrate snack  Symptoms: None  Follow-up CBG: Time:1720 CBG Result:86  Possible Reasons for Event: Inadequate meal intake  Comments/MD notified:     Rob Bunting  Remember to initiate Hypoglycemia Order Set & complete

## 2014-06-22 ENCOUNTER — Other Ambulatory Visit: Payer: Self-pay

## 2014-06-22 LAB — GLUCOSE, CAPILLARY
Glucose-Capillary: 128 mg/dL — ABNORMAL HIGH (ref 70–99)
Glucose-Capillary: 122 mg/dL — ABNORMAL HIGH (ref 70–99)
Glucose-Capillary: 64 mg/dL — ABNORMAL LOW (ref 70–99)
Glucose-Capillary: 181 mg/dL — ABNORMAL HIGH (ref 70–99)
Glucose-Capillary: 97 mg/dL (ref 70–99)

## 2014-06-22 LAB — CK TOTAL AND CKMB (NOT AT ARMC)
CK, MB: 1.1 ng/mL (ref 0.3–4.0)
Relative Index: INVALID (ref 0.0–2.5)
Total CK: 27 U/L (ref 7–177)

## 2014-06-22 LAB — TROPONIN I: Troponin I: 0.04 ng/mL — ABNORMAL HIGH (ref <0.031)

## 2014-06-22 MED ORDER — DILTIAZEM HCL 25 MG/5ML IV SOLN
10.0000 mg | Freq: Once | INTRAVENOUS | Status: AC
  Administered 2014-06-22: 5 mg via INTRAVENOUS
  Filled 2014-06-22: qty 5

## 2014-06-22 MED ORDER — DILTIAZEM HCL 25 MG/5ML IV SOLN
10.0000 mg | Freq: Once | INTRAVENOUS | Status: AC
  Administered 2014-06-22: 10 mg via INTRAVENOUS
  Filled 2014-06-22: qty 5

## 2014-06-22 NOTE — Progress Notes (Signed)
Pt's HR noted to be in 140s. 12 Lead EKG performed. Pt in afib RVR. BP 188/148. MD notified. New orders received. Will continue to monitor.

## 2014-06-22 NOTE — Progress Notes (Signed)
CSW (Clinical Education officer, museum) continues to work with pt and update facility.  [place], [place]

## 2014-06-22 NOTE — Progress Notes (Signed)
NAD. Pericardial tube drainage decreased. To remain one more day  Filed Vitals:   06/22/14 0800 06/22/14 0840 06/22/14 0949 06/22/14 1205  BP:  135/69  108/67  Pulse:  92  76  Temp:  98.2 F (36.8 C)  98 F (36.7 C)  TempSrc:  Oral  Oral  Resp: 18     Height:      Weight:      SpO2:  92% 93% 96%   NAD No JVD noted Chest clear without wheezes RRR NABS Ext warm, no edema  I have reviewed all of today's lab results. Relevant abnormalities are discussed in the A/P section  CXR: [place]  IMPRESSION: COPD with chronic O2 dependence Recurrent pericardial effusion, s/p window and drain PAF > NSR now AKI - improving DM 2, controlled  PLAN: Cont current rx Mgmt of pericardial tube per TCTS  Merton Border, MD ; [facility] [phone].  After 5:30 PM or weekends, call [phone]

## 2014-06-22 NOTE — Progress Notes (Signed)
PT Cancellation Note  Patient Details Name: [name] MRN: [id number] DOB: 21-Jun-1945   Cancelled Treatment:    Reason Eval/Treat Not Completed: Other (comment). Attempted x 2. On first attempt pt requesting respiratory and on second attempt pt sleeping and fatigued.   Wen Merced 06/22/2014, 11:42 AM  Suanne Marker PT [phone]

## 2014-06-22 NOTE — Progress Notes (Signed)
[place] Progress Note Patient Name: [name] DOB: 1946-02-25 MRN: [id number]   Date of Service  06/22/2014  HPI/Events of Note  EKG with Afib/RVR, HR~145  eICU Interventions  Patient with Hx of PAF, review of med list with no afib meds. Hx of COPD, no beta blockers at this time. Ordered cardizem 10mg  IV x 1     Intervention Category Intermediate Interventions: Arrhythmia - evaluation and management  Imad Shostak 06/22/2014, 4:26 PM

## 2014-06-22 NOTE — Progress Notes (Signed)
[place] Progress Note Patient Name: [name] DOB: 1946-05-01 MRN: [id number]   Date of Service  06/22/2014  HPI/Events of Note  Afib\RVR Heart rate 140-150  eICU Interventions  cardizem 10mg  IV given x 1     Intervention Category Intermediate Interventions: Arrhythmia - evaluation and management  [name] 06/22/2014, 11:04 PM

## 2014-06-22 NOTE — Progress Notes (Addendum)
      [address]ates[address]       [place] [address]             [phone]      14 Days Post-Op Procedure(s) (LRB): PERICARDIAL WINDOW (N/A) VIDEO ASSISTED THORACOSCOPY (VATS) (Left)   Subjective:  [name] has no new complaints this morning.  She is hopeful her chest tube will be removed soon.  She is concerned about re-occurrence and what the management would be.  Objective: Vital signs in last 24 hours: Temp:  [97.9 F (36.6 C)-98.7 F (37.1 C)] 97.9 F (36.6 C) (01/06 0350) Cardiac Rhythm:  [-] Normal sinus rhythm (01/05 1923) Resp:  [15-20] 20 (01/06 0350) BP: (106-121)/(61-84) 121/65 mmHg (01/06 0350) SpO2:  [92 %-98 %] 92 % (01/06 0350)  Intake/Output from previous day: 01/05 0701 - 01/06 0700 In: -  Out: 68 [Urine:500; Chest Tube:90]  General appearance: alert, cooperative and no distress Heart: regular rate and rhythm Lungs: clear to auscultation bilaterally Wound: clean and dry  Lab Results:  Recent Labs  06/20/14 0500  WBC 4.0  HGB 8.5*  HCT 28.5*  PLT 329   BMET:  Recent Labs  06/20/14 0500  NA 137  K 3.9  CL 88*  CO2 44*  GLUCOSE 98  BUN 19  CREATININE 1.34*  CALCIUM 8.4    PT/INR: No results for input(s): LABPROT, INR in the last 72 hours. ABG    Component Value Date/Time   PHART 7.265* 06/14/2014 0845   HCO3 40.6* 06/14/2014 0845   TCO2 43 06/14/2014 0845   O2SAT 91.0 06/14/2014 0845   CBG (last 3)   Recent Labs  06/21/14 1644 06/21/14 1719 06/21/14 2114  GLUCAP 64* 86 130*    Assessment/Plan: S/P Procedure(s) (LRB): PERICARDIAL WINDOW (N/A) VIDEO ASSISTED THORACOSCOPY (VATS) (Left)  1. Pericardial drain remains in place- 120cc output since yesterday ( pleurovac currently at 1770).  Output remains stable.  Chest tube management per Dr. [name]   LOS: 15 days    Ellwood Handler 06/22/2014  Patient seen and examined, agree with above Will leave CT one more day due to concern for possible  recurrence. Hopefully windows to pleural space will stay open

## 2014-06-22 NOTE — Progress Notes (Signed)
Hypoglycemic Event  CBG: 64  Treatment: 15 GM carbohydrate snack  Symptoms: None  Follow-up CBG: Time:1843 CBG Result:181  Possible Reasons for Event: Unknown     [name]  Remember to initiate Hypoglycemia Order Set & complete

## 2014-06-22 NOTE — Progress Notes (Signed)
Inpatient Diabetes Program Recommendations  AACE/ADA: New Consensus Statement on Inpatient Glycemic Control (2013)  Target Ranges:  Prepandial:   less than 140 mg/dL      Peak postprandial:   less than 180 mg/dL (1-2 hours)      Critically ill patients:  140 - 180 mg/dL  Results for [name], [name] (MRN [id number]) as of 06/22/2014 12:54  Ref. Range 06/21/2014 16:43 06/21/2014 16:44 06/21/2014 17:19 06/21/2014 21:14 06/22/2014 08:43  Glucose-Capillary Latest Range: 70-99 mg/dL 53 (L) 64 (L) 86 130 (H) 128 (H)   Inpatient Diabetes Program Recommendations Correction (SSI): decrease to sensitive scale  Thank you  Raoul Pitch BSN, RN,CDE Inpatient Diabetes Coordinator [phone] (team pager)

## 2014-06-22 NOTE — Progress Notes (Addendum)
Patient's heart rate increased to 140's to 160's. Patient asked to cough and this brought HR to 140's. Called [facility] physcian [facility]) to notify. Received new orders. Will continue to monitor.   Cardizem (10mg ) given at 1707. Will continue to monitor.

## 2014-06-22 NOTE — Progress Notes (Signed)
[place] Progress Note Patient Name: [name] DOB: 1946/01/23 MRN: [id number]   Date of Service  06/22/2014  HPI/Events of Note  Rapid increase in HR to 130, now down to 100, possible Afib on monitor  eICU Interventions  Ordered stat EKG and CEs     Intervention Category Intermediate Interventions: Arrhythmia - evaluation and management  [name] 06/22/2014, 4:09 PM

## 2014-06-23 ENCOUNTER — Inpatient Hospital Stay (HOSPITAL_COMMUNITY): Payer: Medicare Other

## 2014-06-23 LAB — AFB CULTURE WITH SMEAR (NOT AT ARMC): ACID FAST SMEAR: NONE SEEN

## 2014-06-23 LAB — GLUCOSE, CAPILLARY
GLUCOSE-CAPILLARY: 97 mg/dL (ref 70–99)
GLUCOSE-CAPILLARY: 123 mg/dL — AB (ref 70–99)
GLUCOSE-CAPILLARY: 151 mg/dL — AB (ref 70–99)
Glucose-Capillary: 98 mg/dL (ref 70–99)

## 2014-06-23 LAB — CLOSTRIDIUM DIFFICILE BY PCR: Toxigenic C. Difficile by PCR: NEGATIVE

## 2014-06-23 MED ORDER — DILTIAZEM HCL 25 MG/5ML IV SOLN
10.0000 mg | Freq: Once | INTRAVENOUS | Status: DC
  Filled 2014-06-23: qty 5

## 2014-06-23 MED ORDER — DM-GUAIFENESIN ER 30-600 MG PO TB12
1.0000 | ORAL_TABLET | Freq: Once | ORAL | Status: AC
  Administered 2014-06-23: 1 via ORAL
  Filled 2014-06-23: qty 1

## 2014-06-23 NOTE — Progress Notes (Signed)
Inpatient Diabetes Program Recommendations  AACE/ADA: New Consensus Statement on Inpatient Glycemic Control (2013)  Target Ranges:  Prepandial:   less than 140 mg/dL      Peak postprandial:   less than 180 mg/dL (1-2 hours)      Critically ill patients:  140 - 180 mg/dL   Inpatient Diabetes Program Recommendations Correction (SSI): decrease to sensitive scale  Pt with HYPOglycemia x 2 days at dinner.   Thank you  Raoul Pitch BSN, RN,CDE Inpatient Diabetes Coordinator [phone] (team pager)

## 2014-06-23 NOTE — Progress Notes (Signed)
NAD. Pericardial tube removed 01/07  Filed Vitals:   06/23/14 0923 06/23/14 1152 06/23/14 1600 06/23/14 1724  BP:  101/74 117/65   Pulse:  83  96  Temp:  98.2 F (36.8 C)  98.7 F (37.1 C)  TempSrc:  Oral  Oral  Resp:      Height:      Weight:      SpO2: 96% 97% 96%    NAD No JVD noted Chest clear without wheezes RRR NABS Ext warm, no edema  I have reviewed all of today's lab results. Relevant abnormalities are discussed in the A/P section  CXR: [place]  IMPRESSION: COPD with chronic O2 dependence Recurrent pericardial effusion, s/p window and drain  Tube removed 01/07 PAF > NSR now AKI, resolved DM 2, controlled  PLAN: Cont current rx Pt expresses desire to go home rather than back to SNF  Have requested Care Mgmt assistance to assess home health needs  Merton Border, MD ; [facility] service Mobile [phone].  After 5:30 PM or weekends, call [phone]

## 2014-06-23 NOTE — Progress Notes (Addendum)
      [address]       [place] [address]             [phone]      15 Days Post-Op Procedure(s) (LRB): PERICARDIAL WINDOW (N/A) VIDEO ASSISTED THORACOSCOPY (VATS) (Left)   Subjective:  Ms. Blye complains of having diarrhea since early this morning.  C. Diff culture has been sent.  Objective: Vital signs in last 24 hours: Temp:  [97.1 F (36.2 C)-98.5 F (36.9 C)] 97.1 F (36.2 C) (01/07 0744) Pulse Rate:  [76-125] 89 (01/07 0744) Cardiac Rhythm:  [-] Normal sinus rhythm (01/07 0500) Resp:  [14-20] 14 (01/07 0300) BP: (93-188)/(60-148) 113/60 mmHg (01/07 0744) SpO2:  [93 %-96 %] 96 % (01/07 0744)  Intake/Output from previous day: 01/06 0701 - 01/07 0700 In: 720 [P.O.:720] Out: 1050 [Urine:900; Chest Tube:150]  General appearance: alert, cooperative and no distress Heart: regular rate and rhythm Lungs: clear to auscultation bilaterally Wound: clean and dry  Lab Results: No results for input(s): WBC, HGB, HCT, PLT in the last 72 hours. BMET: No results for input(s): NA, K, CL, CO2, GLUCOSE, BUN, CREATININE, CALCIUM in the last 72 hours.  PT/INR: No results for input(s): LABPROT, INR in the last 72 hours. ABG    Component Value Date/Time   PHART 7.265* 06/14/2014 0845   HCO3 40.6* 06/14/2014 0845   TCO2 43 06/14/2014 0845   O2SAT 91.0 06/14/2014 0845   CBG (last 3)   Recent Labs  06/22/14 1720 06/22/14 1843 06/22/14 2118  GLUCAP 64* 181* 97    Assessment/Plan: S/P Procedure(s) (LRB): PERICARDIAL WINDOW (N/A) VIDEO ASSISTED THORACOSCOPY (VATS) (Left)  1. Pericardial drain in space- about 80 cc output in last 24 hours.  This continues to decrease, can hopefully remove chest tube today, management per Dr. [name]   LOS: 16 days    Ellwood Handler 06/23/2014  Patient seen and examined, agree with above She says loose stools were due to stool softeners Has not had any issue with that this AM Drainage relatively low from  pericardial drain- will dc today

## 2014-06-23 NOTE — Progress Notes (Signed)
Physical Therapy Treatment Patient Details Name: [name] MRN: [id number] DOB: July 24, 1945 Today's Date: 12-Jul-2014    History of Present Illness 69 year old female with recent admissions for pericardial effusions and recent pericardial window, presented to [facility] office 12/22 for f/u OV. She was noted to have severe lower extremity edema. Echo was ordered and showed a recurrence of effusion and early tamponade physiology. S/p pericardial window 12/23, extubated 12/24.    PT Comments    Pt making good progress.  Follow Up Recommendations  SNF     Equipment Recommendations  None recommended by PT    Recommendations for Other Services       Precautions / Restrictions Precautions Precautions: Fall    Mobility  Bed Mobility                  Transfers Overall transfer level: Needs assistance Equipment used: Pushed w/c Transfers: Sit to/from Stand Sit to Stand: Supervision         General transfer comment: supervision for safety  Ambulation/Gait Ambulation/Gait assistance: Supervision Ambulation Distance (Feet): 700 Feet Assistive device:  (pushed w/c) Gait Pattern/deviations: Step-through pattern;Decreased stride length Gait velocity: decreased   General Gait Details: 2 standing rest breaks due to dyspnea. Pt on 3L of O2. Dyspnea 2-3/4   Stairs            Wheelchair Mobility    Modified Rankin (Stroke Patients Only)       Balance Overall balance assessment: Needs assistance Sitting-balance support: No upper extremity supported Sitting balance-Leahy Scale: Normal     Standing balance support: No upper extremity supported Standing balance-Leahy Scale: Fair                      Cognition Arousal/Alertness: Awake/alert Behavior During Therapy: WFL for tasks assessed/performed Overall Cognitive Status: Within Functional Limits for tasks assessed                      Exercises      General Comments         Pertinent Vitals/Pain Pain Assessment: No/denies pain    Home Living                      Prior Function            PT Goals (current goals can now be found in the care plan section) Progress towards PT goals: Progressing toward goals    Frequency  Min 2X/week    PT Plan Current plan remains appropriate    Co-evaluation             End of Session Equipment Utilized During Treatment: Oxygen Activity Tolerance: Patient tolerated treatment well Patient left: in chair;with call bell/phone within reach     Time: 1150-1213 PT Time Calculation (min) (ACUTE ONLY): 23 min  Charges:  $Gait Training: 23-37 mins                    G Codes:      Gideon Burstein July 12, 2014, 1:41 PM  [employer] PT [phone]

## 2014-06-23 NOTE — Progress Notes (Signed)
Chest tube removal order verified with Dr. Alva Garnet. 2S contacted for assistance. A nurse from this unit will be here shortly to complete the order.  Will continue to monitor. Roselyn Reef Nehemyah Foushee,RN

## 2014-06-24 ENCOUNTER — Inpatient Hospital Stay (HOSPITAL_COMMUNITY): Payer: Medicare Other

## 2014-06-24 ENCOUNTER — Encounter (HOSPITAL_COMMUNITY): Payer: Self-pay

## 2014-06-24 LAB — GLUCOSE, CAPILLARY
Glucose-Capillary: 106 mg/dL — ABNORMAL HIGH (ref 70–99)
Glucose-Capillary: 87 mg/dL (ref 70–99)

## 2014-06-24 MED ORDER — OXYCODONE HCL 5 MG PO TABS: 5.0000 mg | ORAL_TABLET | ORAL | Status: DC | PRN

## 2014-06-24 MED ORDER — COLCHICINE 0.6 MG PO TABS: 0.6000 mg | ORAL_TABLET | Freq: Two times a day (BID) | ORAL | Status: DC

## 2014-06-24 MED ORDER — ALPRAZOLAM 0.5 MG PO TABS: 0.5000 mg | ORAL_TABLET | Freq: Three times a day (TID) | ORAL | Status: DC | PRN

## 2014-06-24 MED ORDER — TRAMADOL HCL 50 MG PO TABS: 50.0000 mg | ORAL_TABLET | Freq: Four times a day (QID) | ORAL | Status: DC | PRN

## 2014-06-24 MED ORDER — SENNOSIDES-DOCUSATE SODIUM 8.6-50 MG PO TABS: 1.0000 | ORAL_TABLET | Freq: Every day | ORAL | Status: DC

## 2014-06-24 MED ORDER — TIOTROPIUM BROMIDE MONOHYDRATE 18 MCG IN CAPS: 18.0000 ug | ORAL_CAPSULE | Freq: Every day | RESPIRATORY_TRACT | Status: DC

## 2014-06-24 NOTE — Progress Notes (Signed)
CSW (Clinical Education officer, museum) made aware that pt is ready for dc back to SNF today. CSW notified facility.  Chase, [place]

## 2014-06-24 NOTE — Progress Notes (Signed)
[facility] DAILY ICU PROGRESS NOTE                   [address]            [place] [address]          [phone]   16 Days Post-Op Procedure(s) (LRB): PERICARDIAL WINDOW (N/A) VIDEO ASSISTED THORACOSCOPY (VATS) (Left)  Total Length of Stay:  LOS: 17 days   Subjective: Feels well, no complaints.   Objective: Vital signs in last 24 hours: Temp:  [98.2 F (36.8 C)-99 F (37.2 C)] 98.6 F (37 C) (01/08 0711) Pulse Rate:  [83-102] 102 (01/08 0711) Cardiac Rhythm:  [-] Normal sinus rhythm (01/07 2116) BP: (101-131)/(58-81) 131/81 mmHg (01/08 0711) SpO2:  [95 %-97 %] 95 % (01/08 0711)  Filed Weights   06/11/14 0600 06/12/14 0600 06/19/14 0400  Weight: 180 lb 12.4 oz (82 kg) 178 lb 12.7 oz (81.1 kg) 191 lb 12.8 oz (87 kg)    Weight change:    Hemodynamic parameters for last 24 hours:    Intake/Output from previous day: 01/07 0701 - 01/08 0700 In: 440 [P.O.:440] Out: 300 [Urine:250; Chest Tube:50]     Current Meds: Scheduled Meds: . antiseptic oral rinse  7 mL Mouth Rinse BID  . arformoterol  15 mcg Nebulization BID  . bisacodyl  10 mg Oral Daily  . budesonide (PULMICORT) nebulizer solution  0.25 mg Nebulization BID  . colchicine  0.6 mg Oral BID  . diltiazem  10 mg Intravenous Once  . enoxaparin (LOVENOX) injection  30 mg Subcutaneous Q24H  . insulin aspart  0-15 Units Subcutaneous TID WC  . insulin aspart  0-5 Units Subcutaneous QHS  . multivitamin with minerals  1 tablet Oral Daily  . senna-docusate  1 tablet Oral QHS  . sertraline  50 mg Oral QHS  . tiotropium  18 mcg Inhalation Daily   Continuous Infusions:  PRN Meds:.ALPRAZolam, fentaNYL, levalbuterol, ondansetron (ZOFRAN) IV, oxyCODONE, potassium chloride, traMADol   Physical Exam: General appearance: alert, cooperative and no distress Heart: RRR, mildly tachy around 100 Lungs: Slightly diminished BS in bases Wound: Clean and dry    Lab Results: CBC:No results for input(s): WBC,  HGB, HCT, PLT in the last 72 hours. BMET: No results for input(s): NA, K, CL, CO2, GLUCOSE, BUN, CREATININE, CALCIUM in the last 72 hours.  PT/INR: No results for input(s): LABPROT, INR in the last 72 hours. Radiology: Dg Chest 2 View  06/24/2014   CLINICAL DATA:  Shortness of breath, cough, history of pericardial effusion. Recent chest tube removal.  EXAM: CHEST  2 VIEW  COMPARISON:  06/23/2014; 06/21/2014; 06/20/2014; 06/22/2014  FINDINGS: Grossly unchanged enlarged cardiac silhouette and mediastinal contours. The lungs remain hyperexpanded. Unchanged trace bilateral effusions. There is a small amount of fluid tracking within the bilateral major fissures. Interval removal of left-sided chest tube. No pneumothorax. Pulmonary venous congestion without frank evidence of edema. Grossly unchanged bilateral infrahilar heterogeneous opacities, right greater left. No new focal airspace opacities. Unchanged bones.  IMPRESSION: 1. Interval removal of left-sided chest tube.  No pneumothorax. 2. Similar findings of cardiomegaly, pulmonary venous congestion and trace bilateral effusions. 3. Grossly unchanged bilateral infrahilar heterogeneous opacities, right greater than left, likely atelectasis.   Electronically Signed   By: Sandi Mariscal M.D.   On: 06/24/2014 07:56   Dg Chest Port 1 View  06/23/2014   CLINICAL DATA:  Pericardial effusion  EXAM: PORTABLE CHEST - 1 VIEW  COMPARISON:  06/21/2014  FINDINGS: Central venous catheter tip has been removed. Pericardial drainage tube unchanged in position on the left. The heart remains enlarged.  Small bilateral effusions unchanged. Right lower lobe airspace disease slightly increased. Improved aeration in the left lower lobe.  IMPRESSION: Pericardial drainage tube unchanged in position. Cardiac silhouette remains enlarged.  Improved aeration in the left lower lobe. Slight increase in right lower lobe airspace disease. Bilateral effusions unchanged.   Electronically Signed   By:  Franchot Gallo M.D.   On: 06/23/2014 07:31     Assessment/Plan: S/P Procedure(s) (LRB): PERICARDIAL WINDOW (N/A) VIDEO ASSISTED THORACOSCOPY (VATS) (Left) Stable following CT removal. CXR with no ptx.   Continue current care per primary service. Ok to discharge home vs SNF when ok with pulmonary.  Will arrange outpatient followup.  COLLINS,GINA H 06/24/2014 8:32 AM

## 2014-06-24 NOTE — Discharge Summary (Signed)
Physician Discharge Summary  Patient ID: [name] MRN: [id number] DOB/AGE: 17-Sep-1945 69 y.o.  Admit date: 06/07/2014 Discharge date: 06/24/2014    Discharge Diagnoses:  Acute on Chronic Hypoxic / Hypercarbic Respiratory Failure Severe COPD Oxygen Dependent (3L Baseline) Recurrent Pericardial Effusion, Unclear Etiology  HTN PAF, Currently SR DNR / DNI  AKI GERD Anemia DM2   Anxiety / Depression  Deconditioning of Critical Illness  Pain - post operative                                                                     DISCHARGE PLAN BY DIAGNOSIS     Acute on Chronic Hypoxic / Hypercarbic Respiratory Failure Severe COPD Oxygen Dependent (3L Baseline)  Discharge Plan: Maintenance Regimen:  Pulmicort, Brovana, Spiriva  PRN / Rescue:  PRN Albuterol  Oxygen at 3L  Follow up with Dr. Chase Caller as scheduled.    Recurrent Pericardial Effusion, Unclear Etiology  HTN PAF, Currently SR DNR / DNI   Discharge Plan: Follow up with Dr. [name] in office.  Office will call patient for appointment.  Continue lovastatin, hydralazine  Follow up with PCP regarding BP control  Patient would consider reversal of DNR/DNI if needed for surgical procedure  Continue colchicine  AKI  Discharge Plan: Resolved, no further interventions.  Intermittent monitoring of BMP  GERD  Discharge Plan: Glucerna nutritional supplement  Continue protonix   Anemia  Discharge Plan: Intermittent monitoring of CBC Monitor stool for changes / bleeding   DM2   Discharge Plan: Continue SSI as outlined below    Anxiety / Depression  Deconditioning of Critical Illness  Pain - post operative   Discharge Plan: Continue aggressive PT efforts Xanax PRN  Oxycodone & Tramadol PRN pain Zoloft                 DISCHARGE SUMMARY   [name] is a 69 y.o. y/o female, SNF resident ([facility]) with a PMH of HLD, DM, Significant anxiety, Gout, HTN, obesity, Asthma,  COPD on 3L O2 and recent admissions for pericardial effusions s/p pericardial window (05/11/14).  Although she was not in tamponade at that time, the window did help her to come off pressors and mechanical ventilation and she was initially discharged.She was recently admitted 12/4 -12/14 acute respiratory failure requiring intubation in the setting of an acute COPD exacerbation.  During the admission in November she required prolonged intubation as well.    The patient presented to the [place] office on 12/22 for hospital follow up.  At that time, she was noted to have severe lower extremity edema and a CXR showed enlarged cardiac silhouette which prompted a 2D ECHO.   ECHO revealed a recurrence of pericardial effusion and early tamponade physiology. She was directly admitted to [facility] from the office by [facility] and PCCM consulted.  Initially, she was evaluated by Cardiology for possible percutaneous drainage as she wanted to avoid intubation if possible in the setting of severe lung disease.  After evaluation by Dr. Irish Lack, it was felt that surgical pericardial window was the safer option.  After discussing with the patient, she agreed to proceed with left VATS and pericardial window.  She underwent pericardial window on 12/23 with 400 ml of serous fluid removed (normal  pericardium and epicardium in appearance per notes).  Post procedure, she remained in ICU with chest tubes and pericardial drains. Cultures obtained and negative.  Etiology of effusions remain unclear.  ICU course complicated by difficult weaning, AKI and anemia without overt bleeding.  She was weaned from mechanical ventilation on 06/09/14.  Chest tubes were removed without difficulty.   Patient was assessed by PT and recommended for SNF rehab efforts.  Ultimately cleared for discharge on 1/8 with plans as above.                SIGNIFICANT DIAGNOSTIC STUDIES 04/2011  Spirometry >> Fev1 0.85L/45% 12/22  2D Echo >> Mod LVH, LVEF 55-60%,  Grade 1 DD. PA peak pressure 75 mmHg. Large pericardial effusion, c/w early tamponade. 01/08  CXR >> removal of L chest tube, no pneumothorax, mild vascular congestion, trace bilateral pleural effusions, R>L atelectasis   MICRO DATA  Pericardial Fluid Culture 12/23 >> neg C-Diff 1/7 >> neg  CONSULTS CVTS - Dr. [name]   TUBES / LINES Left CT 12/24 >> 1/7     Discharge Exam: General: wdwn adult female in NAD Neuro:  AAOx4, speech clear, MAE CV: s1s2 rrr, no m/r/g PULM: resp's even/non-labored on 3L O2, lungs bilaterally clear without wheeze GI: NTND, tolerating PO's Extremities: warm/dry, no edema  Filed Vitals:   06/23/14 2012 06/24/14 0023 06/24/14 0424 06/24/14 0711  BP:    131/81  Pulse:    102  Temp: 99 F (37.2 C) 99 F (37.2 C) 98.8 F (37.1 C) 98.6 F (37 C)  TempSrc: Oral Oral Oral Oral  Resp:      Height:      Weight:      SpO2: 95% 96% 96% 95%     Discharge Labs  BMET  Recent Labs Lab 06/18/14 0410 06/19/14 0500 06/20/14 0500  NA 135 134* 137  K 3.9 4.0 3.9  CL 85* 87* 88*  CO2 42* 48* 44*  GLUCOSE 104* 97 98  BUN 15 18 19   CREATININE 1.35* 1.31* 1.34*  CALCIUM 8.3* 8.2* 8.4  MG 2.3  --   --   PHOS 3.9  --   --     CBC  Recent Labs Lab 06/20/14 0500  HGB 8.5*  HCT 28.5*  WBC 4.0  PLT 329     Discharge Instructions    Call MD for:  difficulty breathing, headache or visual disturbances    Complete by:  As directed      Call MD for:  hives    Complete by:  As directed      Call MD for:  persistant dizziness or light-headedness    Complete by:  As directed      Call MD for:  persistant nausea and vomiting    Complete by:  As directed      Call MD for:  redness, tenderness, or signs of infection (pain, swelling, redness, odor or green/yellow discharge around incision site)    Complete by:  As directed      Call MD for:  severe uncontrolled pain    Complete by:  As directed      Call MD for:  temperature >100.4    Complete  by:  As directed      Diet Carb Modified    Complete by:  As directed      Discharge instructions    Complete by:  As directed   1.  Review medications carefully as they have changed 2.  Follow  up with MD's as arranged     Discharge wound care:    Complete by:  As directed   Keep wound clean and dry.  Assess daily with dressing change.  Apply dry gauze to site (chest).     Increase activity slowly    Complete by:  As directed             Follow-up Information    Follow up with [facility], MD On 07/14/2014.   Specialty:  Pulmonary Disease   Why:  Appt at 11:15 AM    Contact information:   [place] [id number] [phone]       Follow up with Melrose Nakayama, MD.   Specialty:  Cardiothoracic Surgery   Why:  Office will call you for follow up appointment   Contact information:   [address] [place] [address] [phone]          Medication List    STOP taking these medications        dextromethorphan-guaiFENesin 30-600 MG per 12 hr tablet  Commonly known as:  MUCINEX DM     ipratropium-albuterol 0.5-2.5 (3) MG/3ML Soln  Commonly known as:  DUONEB     predniSONE 10 MG tablet  Commonly known as:  DELTASONE     promethazine-codeine 6.25-10 MG/5ML syrup  Commonly known as:  PHENERGAN with CODEINE      TAKE these medications        albuterol (2.5 MG/3ML) 0.083% nebulizer solution  Commonly known as:  PROVENTIL  Take 3 mLs (2.5 mg total) by nebulization every 3 (three) hours as needed for wheezing or shortness of breath.     ALPRAZolam 0.5 MG tablet  Commonly known as:  XANAX  Take 1 tablet (0.5 mg total) by mouth 3 (three) times daily as needed for anxiety.     arformoterol 15 MCG/2ML Nebu  Commonly known as:  BROVANA  Take 2 mLs (15 mcg total) by nebulization 2 (two) times daily.     budesonide 0.25 MG/2ML nebulizer solution  Commonly known as:  PULMICORT  Take 2 mLs (0.25 mg total) by nebulization 2  (two) times daily.     colchicine 0.6 MG tablet  Take 1 tablet (0.6 mg total) by mouth 2 (two) times daily.     feeding supplement (GLUCERNA SHAKE) Liqd  Take 237 mLs by mouth 3 (three) times daily between meals.     hydrALAZINE 25 MG tablet  Commonly known as:  APRESOLINE  Take 1 tablet (25 mg total) by mouth every 8 (eight) hours.     insulin aspart 100 UNIT/ML injection  Commonly known as:  novoLOG  - SQ qhs   -   - CBG 70 - 120: 0 units  - CBG 121 - 150: 0 units  - CBG 151 - 200: 0 units  - CBG 201 - 250: 2 units  - CBG 251 - 300: 3 units  - CBG 301 - 350: 4 units  - CBG 351 - 400: 5 units  - CBG > 400: call MD     insulin aspart 100 UNIT/ML injection  Commonly known as:  novoLOG  - SQ TID with meals   -   - CBG 70 - 120: 0 units  - CBG 121 - 150: 2 units  - CBG 151 - 200: 3 units  - CBG 201 - 250: 5 units  - CBG 251 - 300: 8 units  - CBG 301 - 350: 11  units  - CBG 351 - 400: 15 units  - CBG > 400: call MD     lovastatin 40 MG tablet  Commonly known as:  MEVACOR  Take 1 tablet (40 mg total) by mouth at bedtime. For hyperlipedemia     MULTIVITAMIN ADULTS 50+ Tabs  Take 1 tablet by mouth daily.     oxyCODONE 5 MG immediate release tablet  Commonly known as:  Oxy IR/ROXICODONE  Take 1-2 tablets (5-10 mg total) by mouth every 4 (four) hours as needed for severe pain.     pantoprazole 40 MG tablet  Commonly known as:  PROTONIX  Take 1 tablet (40 mg total) by mouth daily.     senna-docusate 8.6-50 MG per tablet  Commonly known as:  Senokot-S  Take 1 tablet by mouth at bedtime.     sertraline 50 MG tablet  Commonly known as:  ZOLOFT  Take 1 tablet (50 mg total) by mouth at bedtime.     tiotropium 18 MCG inhalation capsule  Commonly known as:  SPIRIVA  Place 1 capsule (18 mcg total) into inhaler and inhale daily.     traMADol 50 MG tablet  Commonly known as:  ULTRAM  Take 1-2 tablets (50-100 mg total) by mouth every 6 (six) hours as  needed (mild pain).          Disposition:  SNF for rehab efforts.  Patient hopeful for brief rehab and then discharge to home as goal.   Discharged Condition: [name] has met maximum benefit of inpatient care and is medically stable and cleared for discharge.  Patient is pending follow up as above.      Time spent on disposition:  Greater than 35 minutes.   Signed: Noe Gens, NP-C [facility] Pgr: [phone] Office: [phone]    [facility]erton Border, MD ; [facility] [phone].  After 5:30 PM or weekends, call [phone]

## 2014-06-27 ENCOUNTER — Other Ambulatory Visit: Payer: Self-pay

## 2014-06-27 ENCOUNTER — Ambulatory Visit: Payer: PRIVATE HEALTH INSURANCE | Admitting: Internal Medicine

## 2014-06-27 ENCOUNTER — Non-Acute Institutional Stay (SKILLED_NURSING_FACILITY): Payer: Medicare Other | Admitting: Adult Health

## 2014-06-27 DIAGNOSIS — I10 Essential (primary) hypertension: Secondary | ICD-10-CM

## 2014-06-27 DIAGNOSIS — K219 Gastro-esophageal reflux disease without esophagitis: Secondary | ICD-10-CM

## 2014-06-27 DIAGNOSIS — J441 Chronic obstructive pulmonary disease with (acute) exacerbation: Secondary | ICD-10-CM

## 2014-06-27 DIAGNOSIS — J449 Chronic obstructive pulmonary disease, unspecified: Secondary | ICD-10-CM

## 2014-06-27 DIAGNOSIS — E119 Type 2 diabetes mellitus without complications: Secondary | ICD-10-CM

## 2014-06-27 DIAGNOSIS — E782 Mixed hyperlipidemia: Secondary | ICD-10-CM

## 2014-06-27 MED ORDER — OXYCODONE HCL 5 MG PO TABS: 5.0000 mg | ORAL_TABLET | ORAL | Status: DC | PRN

## 2014-06-27 MED ORDER — ALPRAZOLAM 0.5 MG PO TABS: ORAL_TABLET | ORAL | Status: DC

## 2014-06-27 MED ORDER — TRAMADOL HCL 50 MG PO TABS: 50.0000 mg | ORAL_TABLET | Freq: Four times a day (QID) | ORAL | Status: DC | PRN

## 2014-06-27 NOTE — Telephone Encounter (Signed)
RX faxed to [facility] @ [phone], phone number [phone] 

## 2014-06-30 ENCOUNTER — Encounter: Payer: Self-pay | Admitting: Adult Health

## 2014-06-30 ENCOUNTER — Non-Acute Institutional Stay (SKILLED_NURSING_FACILITY): Payer: Medicare Other | Admitting: Adult Health

## 2014-06-30 ENCOUNTER — Emergency Department (HOSPITAL_COMMUNITY): Payer: Medicare Other

## 2014-06-30 ENCOUNTER — Other Ambulatory Visit: Payer: Self-pay

## 2014-06-30 ENCOUNTER — Encounter (HOSPITAL_COMMUNITY): Payer: Self-pay | Admitting: Emergency Medicine

## 2014-06-30 ENCOUNTER — Inpatient Hospital Stay (HOSPITAL_COMMUNITY)
Admission: EM | Admit: 2014-06-30 | Discharge: 2014-07-05 | DRG: 190 | Disposition: A | Discharge status: Home Health | Payer: Medicare Other | Attending: Internal Medicine | Admitting: Internal Medicine

## 2014-06-30 DIAGNOSIS — E119 Type 2 diabetes mellitus without complications: Secondary | ICD-10-CM | POA: Diagnosis present

## 2014-06-30 DIAGNOSIS — Z66 Do not resuscitate: Secondary | ICD-10-CM | POA: Diagnosis present

## 2014-06-30 DIAGNOSIS — G609 Hereditary and idiopathic neuropathy, unspecified: Secondary | ICD-10-CM | POA: Diagnosis present

## 2014-06-30 DIAGNOSIS — E669 Obesity, unspecified: Secondary | ICD-10-CM

## 2014-06-30 DIAGNOSIS — J441 Chronic obstructive pulmonary disease with (acute) exacerbation: Secondary | ICD-10-CM

## 2014-06-30 DIAGNOSIS — K219 Gastro-esophageal reflux disease without esophagitis: Secondary | ICD-10-CM

## 2014-06-30 DIAGNOSIS — J449 Chronic obstructive pulmonary disease, unspecified: Secondary | ICD-10-CM

## 2014-06-30 DIAGNOSIS — I10 Essential (primary) hypertension: Secondary | ICD-10-CM | POA: Diagnosis present

## 2014-06-30 DIAGNOSIS — J9622 Acute and chronic respiratory failure with hypercapnia: Secondary | ICD-10-CM

## 2014-06-30 DIAGNOSIS — R0603 Acute respiratory distress: Secondary | ICD-10-CM

## 2014-06-30 DIAGNOSIS — Z794 Long term (current) use of insulin: Secondary | ICD-10-CM

## 2014-06-30 DIAGNOSIS — D62 Acute posthemorrhagic anemia: Secondary | ICD-10-CM

## 2014-06-30 DIAGNOSIS — I248 Other forms of acute ischemic heart disease: Secondary | ICD-10-CM | POA: Diagnosis present

## 2014-06-30 DIAGNOSIS — R0789 Other chest pain: Secondary | ICD-10-CM

## 2014-06-30 DIAGNOSIS — Z87891 Personal history of nicotine dependence: Secondary | ICD-10-CM | POA: Diagnosis not present

## 2014-06-30 DIAGNOSIS — J9611 Chronic respiratory failure with hypoxia: Secondary | ICD-10-CM

## 2014-06-30 DIAGNOSIS — I313 Pericardial effusion (noninflammatory): Secondary | ICD-10-CM

## 2014-06-30 DIAGNOSIS — Z825 Family history of asthma and other chronic lower respiratory diseases: Secondary | ICD-10-CM

## 2014-06-30 DIAGNOSIS — D649 Anemia, unspecified: Secondary | ICD-10-CM

## 2014-06-30 DIAGNOSIS — M109 Gout, unspecified: Secondary | ICD-10-CM | POA: Diagnosis present

## 2014-06-30 DIAGNOSIS — Z860100 Personal history of colon polyps, unspecified: Secondary | ICD-10-CM

## 2014-06-30 DIAGNOSIS — F410 Panic disorder [episodic paroxysmal anxiety] without agoraphobia: Secondary | ICD-10-CM | POA: Diagnosis present

## 2014-06-30 DIAGNOSIS — N179 Acute kidney failure, unspecified: Secondary | ICD-10-CM | POA: Diagnosis present

## 2014-06-30 DIAGNOSIS — Z9689 Presence of other specified functional implants: Secondary | ICD-10-CM

## 2014-06-30 DIAGNOSIS — F329 Major depressive disorder, single episode, unspecified: Secondary | ICD-10-CM | POA: Diagnosis present

## 2014-06-30 DIAGNOSIS — I480 Paroxysmal atrial fibrillation: Secondary | ICD-10-CM

## 2014-06-30 DIAGNOSIS — R000 Tachycardia, unspecified: Secondary | ICD-10-CM

## 2014-06-30 DIAGNOSIS — E872 Acidosis: Secondary | ICD-10-CM | POA: Diagnosis present

## 2014-06-30 DIAGNOSIS — D509 Iron deficiency anemia, unspecified: Secondary | ICD-10-CM

## 2014-06-30 DIAGNOSIS — F411 Generalized anxiety disorder: Secondary | ICD-10-CM | POA: Diagnosis present

## 2014-06-30 DIAGNOSIS — Z7189 Other specified counseling: Secondary | ICD-10-CM

## 2014-06-30 DIAGNOSIS — G4700 Insomnia, unspecified: Secondary | ICD-10-CM

## 2014-06-30 DIAGNOSIS — Z9981 Dependence on supplemental oxygen: Secondary | ICD-10-CM | POA: Diagnosis not present

## 2014-06-30 DIAGNOSIS — I4891 Unspecified atrial fibrillation: Secondary | ICD-10-CM

## 2014-06-30 DIAGNOSIS — Q2730 Arteriovenous malformation, site unspecified: Secondary | ICD-10-CM

## 2014-06-30 DIAGNOSIS — R0602 Shortness of breath: Secondary | ICD-10-CM

## 2014-06-30 DIAGNOSIS — J9620 Acute and chronic respiratory failure, unspecified whether with hypoxia or hypercapnia: Secondary | ICD-10-CM | POA: Diagnosis present

## 2014-06-30 DIAGNOSIS — J810 Acute pulmonary edema: Secondary | ICD-10-CM

## 2014-06-30 DIAGNOSIS — K5520 Angiodysplasia of colon without hemorrhage: Secondary | ICD-10-CM

## 2014-06-30 DIAGNOSIS — I484 Atypical atrial flutter: Secondary | ICD-10-CM | POA: Diagnosis present

## 2014-06-30 DIAGNOSIS — J45909 Unspecified asthma, uncomplicated: Secondary | ICD-10-CM | POA: Diagnosis present

## 2014-06-30 DIAGNOSIS — E785 Hyperlipidemia, unspecified: Secondary | ICD-10-CM | POA: Diagnosis present

## 2014-06-30 DIAGNOSIS — R0600 Dyspnea, unspecified: Secondary | ICD-10-CM

## 2014-06-30 DIAGNOSIS — M7989 Other specified soft tissue disorders: Secondary | ICD-10-CM

## 2014-06-30 DIAGNOSIS — J90 Pleural effusion, not elsewhere classified: Secondary | ICD-10-CM

## 2014-06-30 DIAGNOSIS — Z79899 Other long term (current) drug therapy: Secondary | ICD-10-CM

## 2014-06-30 DIAGNOSIS — J438 Other emphysema: Secondary | ICD-10-CM

## 2014-06-30 DIAGNOSIS — J9621 Acute and chronic respiratory failure with hypoxia: Secondary | ICD-10-CM

## 2014-06-30 DIAGNOSIS — I314 Cardiac tamponade: Secondary | ICD-10-CM

## 2014-06-30 DIAGNOSIS — K31819 Angiodysplasia of stomach and duodenum without bleeding: Secondary | ICD-10-CM

## 2014-06-30 DIAGNOSIS — J9602 Acute respiratory failure with hypercapnia: Secondary | ICD-10-CM

## 2014-06-30 DIAGNOSIS — R579 Shock, unspecified: Secondary | ICD-10-CM

## 2014-06-30 DIAGNOSIS — Z7951 Long term (current) use of inhaled steroids: Secondary | ICD-10-CM | POA: Diagnosis not present

## 2014-06-30 DIAGNOSIS — I3139 Other pericardial effusion (noninflammatory): Secondary | ICD-10-CM

## 2014-06-30 DIAGNOSIS — Z515 Encounter for palliative care: Secondary | ICD-10-CM | POA: Diagnosis not present

## 2014-06-30 DIAGNOSIS — I309 Acute pericarditis, unspecified: Secondary | ICD-10-CM

## 2014-06-30 DIAGNOSIS — L03116 Cellulitis of left lower limb: Secondary | ICD-10-CM

## 2014-06-30 DIAGNOSIS — J9601 Acute respiratory failure with hypoxia: Secondary | ICD-10-CM

## 2014-06-30 DIAGNOSIS — D500 Iron deficiency anemia secondary to blood loss (chronic): Secondary | ICD-10-CM

## 2014-06-30 DIAGNOSIS — E782 Mixed hyperlipidemia: Secondary | ICD-10-CM

## 2014-06-30 LAB — CBC WITH DIFFERENTIAL/PLATELET
BASOS PCT: 0 % (ref 0–1)
Basophils Absolute: 0 10*3/uL (ref 0.0–0.1)
EOS ABS: 0 10*3/uL (ref 0.0–0.7)
Eosinophils Relative: 0 % (ref 0–5)
HCT: 30.7 % — ABNORMAL LOW (ref 36.0–46.0)
Hemoglobin: 9.2 g/dL — ABNORMAL LOW (ref 12.0–15.0)
LYMPHS PCT: 18 % (ref 12–46)
Lymphs Abs: 1.3 10*3/uL (ref 0.7–4.0)
MCH: 27.1 pg (ref 26.0–34.0)
MCHC: 30 g/dL (ref 30.0–36.0)
MCV: 90.3 fL (ref 78.0–100.0)
Monocytes Absolute: 0.7 10*3/uL (ref 0.1–1.0)
Monocytes Relative: 9 % (ref 3–12)
NEUTROS PCT: 73 % (ref 43–77)
Neutro Abs: 5.5 10*3/uL (ref 1.7–7.7)
Platelets: 361 10*3/uL (ref 150–400)
RBC: 3.4 MIL/uL — AB (ref 3.87–5.11)
RDW: 16.5 % — ABNORMAL HIGH (ref 11.5–15.5)
WBC: 7.5 10*3/uL (ref 4.0–10.5)

## 2014-06-30 LAB — BASIC METABOLIC PANEL
ANION GAP: 8 (ref 5–15)
BUN: 18 mg/dL (ref 6–23)
CALCIUM: 8.9 mg/dL (ref 8.4–10.5)
CO2: 34 mmol/L — ABNORMAL HIGH (ref 19–32)
Chloride: 98 mEq/L (ref 96–112)
Creatinine, Ser: 1.52 mg/dL — ABNORMAL HIGH (ref 0.50–1.10)
GFR calc non Af Amer: 34 mL/min — ABNORMAL LOW (ref >90)
GFR, EST AFRICAN AMERICAN: 40 mL/min — AB (ref >90)
Glucose, Bld: 121 mg/dL — ABNORMAL HIGH (ref 70–99)
POTASSIUM: 4.3 mmol/L (ref 3.5–5.1)
Sodium: 140 mmol/L (ref 135–145)

## 2014-06-30 LAB — I-STAT ARTERIAL BLOOD GAS, ED
Acid-Base Excess: 6 mmol/L — ABNORMAL HIGH (ref 0.0–2.0)
Bicarbonate: 35.2 mEq/L — ABNORMAL HIGH (ref 20.0–24.0)
O2 SAT: 91 %
PO2 ART: 74 mmHg — AB (ref 80.0–100.0)
TCO2: 38 mmol/L (ref 0–100)
pCO2 arterial: 84 mmHg (ref 35.0–45.0)
pH, Arterial: 7.23 — ABNORMAL LOW (ref 7.350–7.450)

## 2014-06-30 LAB — GLUCOSE, CAPILLARY: Glucose-Capillary: 118 mg/dL — ABNORMAL HIGH (ref 70–99)

## 2014-06-30 LAB — BLOOD GAS, ARTERIAL
ACID-BASE EXCESS: 4.4 mmol/L — AB (ref 0.0–2.0)
Bicarbonate: 33.3 mEq/L — ABNORMAL HIGH (ref 20.0–24.0)
DELIVERY SYSTEMS: POSITIVE
DRAWN BY: 41934
Expiratory PAP: 5
FIO2: 0.8 %
Inspiratory PAP: 13
O2 SAT: 97.9 %
PH ART: 7.124 — AB (ref 7.350–7.450)
Patient temperature: 98.6
TCO2: 36.5 mmol/L (ref 0–100)
pCO2 arterial: 106 mmHg (ref 35.0–45.0)
pO2, Arterial: 140 mmHg — ABNORMAL HIGH (ref 80.0–100.0)

## 2014-06-30 LAB — CBG MONITORING, ED: GLUCOSE-CAPILLARY: 109 mg/dL — AB (ref 70–99)

## 2014-06-30 LAB — I-STAT CG4 LACTIC ACID, ED: Lactic Acid, Venous: <0.3 mmol/L — ABNORMAL LOW (ref 0.5–2.2)

## 2014-06-30 LAB — TROPONIN I
Troponin I: 0.08 ng/mL — ABNORMAL HIGH (ref <0.031)
Troponin I: 0.07 ng/mL — ABNORMAL HIGH (ref <0.031)

## 2014-06-30 LAB — I-STAT TROPONIN, ED: TROPONIN I, POC: 0.06 ng/mL (ref 0.00–0.08)

## 2014-06-30 LAB — MRSA PCR SCREENING: MRSA by PCR: NEGATIVE

## 2014-06-30 LAB — BRAIN NATRIURETIC PEPTIDE: B Natriuretic Peptide: 493.8 pg/mL — ABNORMAL HIGH (ref 0.0–100.0)

## 2014-06-30 LAB — CREATININE, SERUM
Creatinine, Ser: 1.6 mg/dL — ABNORMAL HIGH (ref 0.50–1.10)
GFR calc Af Amer: 37 mL/min — ABNORMAL LOW (ref >90)
GFR calc non Af Amer: 32 mL/min — ABNORMAL LOW (ref >90)

## 2014-06-30 MED ORDER — ACETAMINOPHEN 325 MG PO TABS: 650.0000 mg | ORAL_TABLET | Freq: Four times a day (QID) | ORAL | Status: DC | PRN

## 2014-06-30 MED ORDER — SODIUM CHLORIDE 0.9 % IJ SOLN
3.0000 mL | Freq: Two times a day (BID) | INTRAMUSCULAR | Status: DC
  Administered 2014-06-30 – 2014-07-04 (×6): 3 mL via INTRAVENOUS

## 2014-06-30 MED ORDER — LEVALBUTEROL HCL 1.25 MG/0.5ML IN NEBU
1.2500 mg | INHALATION_SOLUTION | Freq: Four times a day (QID) | RESPIRATORY_TRACT | Status: DC | PRN
  Filled 2014-06-30: qty 0.5

## 2014-06-30 MED ORDER — SODIUM CHLORIDE 0.9 % IV SOLN: 250.0000 mL | INTRAVENOUS | Status: DC | PRN

## 2014-06-30 MED ORDER — HEPARIN SODIUM (PORCINE) 5000 UNIT/ML IJ SOLN: 5000.0000 [IU] | Freq: Three times a day (TID) | INTRAMUSCULAR | Status: DC

## 2014-06-30 MED ORDER — ARFORMOTEROL TARTRATE 15 MCG/2ML IN NEBU
15.0000 ug | INHALATION_SOLUTION | Freq: Two times a day (BID) | RESPIRATORY_TRACT | Status: DC
  Administered 2014-06-30 – 2014-07-05 (×10): 15 ug via RESPIRATORY_TRACT
  Filled 2014-06-30 (×12): qty 2

## 2014-06-30 MED ORDER — HEPARIN SODIUM (PORCINE) 5000 UNIT/ML IJ SOLN
5000.0000 [IU] | Freq: Three times a day (TID) | INTRAMUSCULAR | Status: DC
  Administered 2014-07-01 – 2014-07-05 (×11): 5000 [IU] via SUBCUTANEOUS
  Filled 2014-06-30 (×15): qty 1

## 2014-06-30 MED ORDER — ALBUTEROL (5 MG/ML) CONTINUOUS INHALATION SOLN
10.0000 mg/h | INHALATION_SOLUTION | Freq: Once | RESPIRATORY_TRACT | Status: AC
  Administered 2014-06-30: 10 mg/h via RESPIRATORY_TRACT
  Filled 2014-06-30: qty 20

## 2014-06-30 MED ORDER — TIOTROPIUM BROMIDE MONOHYDRATE 18 MCG IN CAPS
18.0000 ug | ORAL_CAPSULE | Freq: Every day | RESPIRATORY_TRACT | Status: DC
  Administered 2014-07-01 – 2014-07-05 (×5): 18 ug via RESPIRATORY_TRACT
  Filled 2014-06-30: qty 5

## 2014-06-30 MED ORDER — SODIUM CHLORIDE 0.9 % IJ SOLN
3.0000 mL | Freq: Two times a day (BID) | INTRAMUSCULAR | Status: DC
  Administered 2014-06-30 – 2014-07-02 (×3): 3 mL via INTRAVENOUS

## 2014-06-30 MED ORDER — BUDESONIDE 0.25 MG/2ML IN SUSP
0.2500 mg | Freq: Two times a day (BID) | RESPIRATORY_TRACT | Status: DC
  Administered 2014-06-30 – 2014-07-05 (×10): 0.25 mg via RESPIRATORY_TRACT
  Filled 2014-06-30 (×12): qty 2

## 2014-06-30 MED ORDER — ACETAMINOPHEN 650 MG RE SUPP: 650.0000 mg | Freq: Four times a day (QID) | RECTAL | Status: DC | PRN

## 2014-06-30 MED ORDER — LACTATED RINGERS IV SOLN: INTRAVENOUS | Status: DC

## 2014-06-30 MED ORDER — ALPRAZOLAM 0.25 MG PO TABS
0.2500 mg | ORAL_TABLET | Freq: Three times a day (TID) | ORAL | Status: DC | PRN
  Administered 2014-07-02 – 2014-07-05 (×4): 0.25 mg via ORAL
  Filled 2014-06-30 (×4): qty 1

## 2014-06-30 MED ORDER — METHYLPREDNISOLONE SODIUM SUCC 125 MG IJ SOLR
60.0000 mg | Freq: Two times a day (BID) | INTRAMUSCULAR | Status: DC
  Administered 2014-07-01 – 2014-07-04 (×7): 60 mg via INTRAVENOUS
  Filled 2014-06-30 (×3): qty 0.96
  Filled 2014-06-30: qty 2
  Filled 2014-06-30: qty 0.96
  Filled 2014-06-30: qty 2
  Filled 2014-06-30 (×4): qty 0.96

## 2014-06-30 MED ORDER — INSULIN ASPART 100 UNIT/ML ~~LOC~~ SOLN
0.0000 [IU] | SUBCUTANEOUS | Status: DC
  Administered 2014-07-01 – 2014-07-02 (×3): 1 [IU] via SUBCUTANEOUS

## 2014-06-30 MED ORDER — HYDROMORPHONE HCL 1 MG/ML IJ SOLN: 0.5000 mg | INTRAMUSCULAR | Status: DC | PRN

## 2014-06-30 MED ORDER — METHYLPREDNISOLONE SODIUM SUCC 125 MG IJ SOLR
125.0000 mg | Freq: Once | INTRAMUSCULAR | Status: AC
  Administered 2014-06-30: 125 mg via INTRAVENOUS
  Filled 2014-06-30: qty 2

## 2014-06-30 MED ORDER — SODIUM CHLORIDE 0.9 % IJ SOLN: 3.0000 mL | INTRAMUSCULAR | Status: DC | PRN

## 2014-06-30 MED ORDER — LEVALBUTEROL HCL 0.63 MG/3ML IN NEBU
0.6300 mg | INHALATION_SOLUTION | Freq: Four times a day (QID) | RESPIRATORY_TRACT | Status: DC | PRN
  Administered 2014-07-01: 0.63 mg via RESPIRATORY_TRACT
  Filled 2014-06-30 (×2): qty 3

## 2014-06-30 NOTE — Progress Notes (Signed)
Patient ID: [name], female   DOB: 05-03-1946, 69 y.o.   MRN: [id number]  [facility] Gemma living [place]     Allergies  Allergen Reactions  . Lisinopril Swelling    Angioedema   . Advair Diskus [Fluticasone-Salmeterol] Other (See Comments)    Nose bleeds.   . Penicillins     hives  . Levaquin [Levofloxacin] Hives    White marks inside mouth       Chief Complaint  Patient presents with  . Acute Visit    low 02 sats    HPI:  Nursing staff reports that her 02 sats are 79-83% on 02 and after receiving a duoneb treatment. Nursing staff did attempt to use I/S in an attempt to improve her lung capacity. This has not been successful. She denies any shortness of breath; chest pain; no cough no sputum production. She states that she is feeling good. She is due to go home in the am.    Past Medical History  Diagnosis Date  . COPD (chronic obstructive pulmonary disease)   . Hypertension   . Diabetes mellitus 09/13/2010    glucophage  . Gout   . Oxygen dependent     pt uses O2   . Pericardial effusion 08/2011    small residual on echo  . Hyperlipidemia   . Personal history of noncompliance with medical treatment, presenting hazards to health   . Unspecified disease of pericardium   . Hemorrhage of rectum and anus   . Panic disorder without agoraphobia   . Depressive disorder, not elsewhere classified   . Anxiety state, unspecified   . Unspecified hereditary and idiopathic peripheral neuropathy   . Obesity, unspecified   . Unspecified tinnitus   . Unspecified essential hypertension   . Pneumonia, organism unspecified   . Chronic obstructive asthma, unspecified   . Acute and chronic respiratory failure   . Urinary frequency   . Urgency of urination   . Anemia due to chronic blood loss 01/04/2013    Past Surgical History  Procedure Laterality Date  . Breast biopsy      both breasts  . Total abdominal hysterectomy    . Esophagogastroduodenoscopy N/A 01/03/2013   Procedure: ESOPHAGOGASTRODUODENOSCOPY (EGD);  Surgeon: Inda Castle, MD;  Location: [place];  Service: Endoscopy;  Laterality: N/A;  . Colonoscopy N/A 01/05/2013    Procedure: COLONOSCOPY;  Surgeon: Gatha Mayer, MD;  Location: [place];  Service: Endoscopy;  Laterality: N/A;  . Subxyphoid pericardial window N/A 05/11/2014    Procedure: SUBXYPHOID PERICARDIAL WINDOW;  Surgeon: Melrose Nakayama, MD;  Location: [place];  Service: Thoracic;  Laterality: N/A;  . Tee without cardioversion N/A 05/11/2014    Procedure: TRANSESOPHAGEAL ECHOCARDIOGRAM (TEE);  Surgeon: Melrose Nakayama, MD;  Location: [place];  Service: Thoracic;  Laterality: N/A;  . Pericardial window N/A 06/08/2014    Procedure: PERICARDIAL WINDOW;  Surgeon: Melrose Nakayama, MD;  Location: [place];  Service: Thoracic;  Laterality: N/A;  . Video assisted thoracoscopy (vats)/thorocotomy Left 06/08/2014    Procedure: VIDEO ASSISTED THORACOSCOPY (VATS);  Surgeon: Melrose Nakayama, MD;  Location: [facility];  Service: Thoracic;  Laterality: Left;    VITAL SIGNS BP 119/70 mmHg  Pulse 120  Resp 22  Ht 5\' 2"  (1.575 m)  Wt 164 lb (74.39 kg)  BMI 29.99 kg/m2  SpO2 79%   Outpatient Encounter Prescriptions as of 06/30/2014  Medication Sig  . albuterol (PROVENTIL) (2.5 MG/3ML) 0.083% nebulizer solution Take 3 mLs (2.5 mg  total) by nebulization every 3 (three) hours as needed for wheezing or shortness of breath.  . ALPRAZolam (XANAX) 0.5 MG tablet 1 by mouth every 8 hours as needed for anxiety  . arformoterol (BROVANA) 15 MCG/2ML NEBU Take 2 mLs (15 mcg total) by nebulization 2 (two) times daily.  . budesonide (PULMICORT) 0.25 MG/2ML nebulizer solution Take 2 mLs (0.25 mg total) by nebulization 2 (two) times daily.  . colchicine 0.6 MG tablet Take 1 tablet (0.6 mg total) by mouth 2 (two) times daily.  . feeding supplement, GLUCERNA SHAKE, (GLUCERNA SHAKE) LIQD Take 237 mLs by mouth 3 (three) times daily between meals.  .  hydrALAZINE (APRESOLINE) 25 MG tablet Take 1 tablet (25 mg total) by mouth every 8 (eight) hours.  . insulin aspart (NOVOLOG) 100 UNIT/ML injection SQ qhs   CBG 70 - 120: 0 units CBG 121 - 150: 0 units CBG 151 - 200: 0 units CBG 201 - 250: 2 units CBG 251 - 300: 3 units CBG 301 - 350: 4 units CBG 351 - 400: 5 units CBG > 400: call MD  . insulin aspart (NOVOLOG) 100 UNIT/ML injection SQ TID with meals   CBG 70 - 120: 0 units CBG 121 - 150: 2 units CBG 151 - 200: 3 units CBG 201 - 250: 5 units CBG 251 - 300: 8 units CBG 301 - 350: 11 units CBG 351 - 400: 15 units CBG > 400: call MD  . lovastatin (MEVACOR) 40 MG tablet Take 1 tablet (40 mg total) by mouth at bedtime. For hyperlipedemia  . Multiple Vitamins-Minerals (MULTIVITAMIN ADULTS 50+) TABS Take 1 tablet by mouth daily.  [name] oxyCODONE (OXY IR/ROXICODONE) 5 MG immediate release tablet Take 1-2 tablets (5-10 mg total) by mouth every 4 (four) hours as needed for severe pain.  . pantoprazole (PROTONIX) 40 MG tablet Take 1 tablet (40 mg total) by mouth daily.  [name] senna-docusate (SENOKOT-S) 8.6-50 MG per tablet Take 1 tablet by mouth at bedtime.  . sertraline (ZOLOFT) 50 MG tablet Take 1 tablet (50 mg total) by mouth at bedtime.  [name] tiotropium (SPIRIVA) 18 MCG inhalation capsule Place 1 capsule (18 mcg total) into inhaler and inhale daily.  . traMADol (ULTRAM) 50 MG tablet Take 1-2 tablets (50-100 mg total) by mouth every 6 (six) hours as needed (mild pain).     SIGNIFICANT DIAGNOSTIC EXAMS   05-01-14: left ankle x-ray: Soft tissue swelling, without acute osseous finding.   05-01-14: left foot x-ray; Soft tissue swelling and degenerative change, without acute osseous abnormality  05-03-14: left lower extremity doppler: No evidence of deep vein or superficial thrombosis involving the left lower extremity and right common femoral vein. - No evidence of Baker&'s cyst on the left.  05-04-14: chest x-ray: Cardiac enlargement with  pulmonary vascular congestion, small bilateral pleural effusions, and basilar infiltrates similar to previous study  05-10-14: chest x-ray: 1. Unremarkable positioning of tubes and central line. 2. Pulmonary edema with layering effusions and bibasilar atelectasis. These changes could obscure pneumonia.  05-11-14: TEE: - Left ventricle: Systolic function was normal. The estimated ejection fraction was in the range of 50% to 55%. - Aortic valve: No evidence of vegetation. There was trivial regurgitation. - Mitral valve: No evidence of vegetation. Impressions: - Limited study for Subxyphoid Pericardial Window: There was a moderate to large pericardial effusion surrounding heart. Left ventricular function did improve after drainage of effusion. The patient tolerated the procedure well. At the end of the procedure the TEE was removed without  difficulty. The patient was later taken back to the SICU intubated in stable condition.  05-12-14: chest x-ray: No marked change in bilateral pleural effusions and right worse than left airspace disease. Marked enlargement of the cardiopericardial silhouette with a pericardial drain in place  05-15-14: chest x-ray: Bilateral small pleural effusion with bilateral basilar atelectasis or infiltrate. Persistent mild interstitial prominence and patchy airspace disease right lung. Asymmetric edema or pneumonia cannot be excluded  06-19-14: chest x-ray: Improvement in mild interstitial edema. Similar small bilateral pleural effusions. Shifting bibasilar airspace disease which could represent atelectasis or infection.  06-24-14: chest x-ray; 1. Interval removal of left-sided chest tube.  No pneumothorax. 2. Similar findings of cardiomegaly, pulmonary venous congestion and trace bilateral effusions. 3. Grossly unchanged bilateral infrahilar heterogeneous opacities, right greater than left, likely atelectasis.      LABS REVIEWED:   05-10-14: wbc 6.3; hgb 8.6; hct 26.4;  mcv 88.9; plt 205; glucose 122; bun 62; creat 2.06; k+4.3; na++145; liver normal albumin 2.6 05-11-14: wbc 6.0; hgb 7.9; hct 25.2; mcv 85.4; plt 198; glucose 117; bun 61; creat 1.99; k+4.4; na++146; phos 2.7; mag 2.5; RA factor: neg 05-13-14: wbc 10.2; hgb 9.8; hct 31.7; mcv 86.4; plt 228; glucose 124; bun 40; creat 1.45; k+3.7; na++149; liver normal albumin 2.5; BNP 1907  05-17-14: wbc 7.8;hgb 9.0; hct 30.0; mcv 88.5; plt 282; glucose 99; bun 18; creat 1.14; k+4.5; na++144; phos 3.2; mag 2.2     Review of Systems  Constitutional: Negative for malaise/fatigue.  Respiratory: Negative for cough, shortness of breath and wheezing.   Cardiovascular: Negative for chest pain, palpitations, orthopnea and leg swelling.  Gastrointestinal: Negative for heartburn, abdominal pain and constipation.  Musculoskeletal: Negative for myalgias, back pain and joint pain.  Skin: Negative.   Psychiatric/Behavioral: Negative for depression. The patient is not nervous/anxious.      Physical Exam  Constitutional: She is oriented to person, place, and time. She appears well-developed and well-nourished. No distress.  Overweight   Neck: Neck supple. No JVD present. No thyromegaly present.  Cardiovascular: Intact distal pulses.   Is tachycardic   Respiratory: Effort normal. No respiratory distress. She has no wheezes.  Breath sounds diminished throughout   GI: Soft. Bowel sounds are normal. She exhibits no distension. There is no tenderness.  Musculoskeletal: Normal range of motion. She exhibits no edema.  Lymphadenopathy:    She has no cervical adenopathy.  Neurological: She is alert and oriented to person, place, and time.  Skin: Skin is warm and dry. She is not diaphoretic.  Psychiatric: She has a normal mood and affect.       ASSESSMENT/ PLAN:  1.copd with exacerbation 2. Acute on chronic respiratory failure  Will send her to the ED for further evaluation and treatment options. With her 02 sats  this low she is beyond the care of the SNF environment.   Time spent with patient 45 minutes  Ok Edwards NP [facility]  Contact [phone] Monday through Friday 8am- 5pm  After hours call [phone]

## 2014-06-30 NOTE — H&P (Addendum)
[facility] History and Physical  JAHNYLA PARRILLO GMW:[id number] DOB: 1945/10/10 DOA: 06/30/2014  Referring physician:   PCP: Hollace Kinnier, DO   Chief Complaint: Shortness of breath   HPI:  [name] is a 69 y.o. y/o female, SNF resident ([facility]) with a PMH of HLD, DM, Significant anxiety, Gout, HTN, obesity, Asthma, COPD on 3L O2 and  2 recent admissions for pericardial effusions s/p pericardial window (05/11/14) and 12/23. Patient discharged 06/24/14 after a prolonged hospitalization for recurrent pericardial effusion and VATS , transferred back to the ER for shortness of breath, increased work of breathing, tachycardia. Found to be tachypneic with a respiratory rate about 25. Patient endorses shortness of breath for the last 2 days. According to the EDP the patient was able to speak in full sentences and engage in a conversation upon presentation. She did not have any gross focal neurologic deficits. At the time of my interview the patient is on BiPAP and unable to speak. However she denies any chest pain, fever, nausea, vomiting, diarrhea. Emergency department cardiac ultrasound showed normal contractility without temponade  physiology. She is found to have hypercapnic respiratory failure and replaced on BiPAP in the ED and being admitted to step down.    Patient has had 3 admissions since November. Although she was not in tamponade , patient required a pericardial window 11/25, the window did help her to come off pressors and mechanical ventilation and she was initially discharged.She was recently admitted 12/4 -12/14 acute respiratory failure requiring intubation in the setting of an acute COPD exacerbation. During the admission in November she required prolonged intubation as well.   The patient presented to the [facility] office on 12/22 for hospital follow up. At that time, she was noted to have severe lower extremity edema and a CXR showed enlarged cardiac  silhouette which prompted a 2D ECHO. ECHO revealed a recurrence of pericardial effusion and early tamponade physiology. She was directly admitted to [facility] from the office by [facility] and PCCM consulted. Initially, she was evaluated by Cardiology for possible percutaneous drainage as she wanted to avoid intubation if possible in the setting of severe lung disease. After evaluation by Dr. Irish Lack, it was felt that surgical pericardial window was the safer option. After discussing with the patient, she agreed to proceed with left VATS and pericardial window. She underwent pericardial window on 12/23 with 400 ml of serous fluid removed (normal pericardium and epicardium in appearance per notes). Post procedure, she remained in ICU with chest tubes and pericardial drains. Cultures obtained and negative. Etiology of effusions remain unclear. ICU course complicated by difficult weaning, AKI and anemia without overt bleeding. She was weaned from mechanical ventilation on 06/09/14. Chest tubes were removed without difficulty. Patient was assessed by PT and recommended for SNF rehab efforts. Ultimately cleared for discharge on 1/8 and was supposed to follow-up with Dr. [name]. She was a DNR/DNI at the time of her discharge    Review of Systems: negative for the following  Constitutional: Positive for activity change.  HENT: Negative for facial swelling.  Respiratory: Positive for shortness of breath and wheezing. Negative for cough.  Cardiovascular: Negative for chest pain.  Gastrointestinal: Negative for nausea, vomiting, abdominal pain, diarrhea, constipation, blood in stool and abdominal distention.  Genitourinary: Negative for hematuria and difficulty urinating.  Musculoskeletal: Negative for neck pain.  Skin: Negative for color change.  Neurological: Negative for speech difficulty.  Hematological: Does not bruise/bleed easily.  Psychiatric/Behavioral: Negative for confusion.  All  other systems reviewed and are negative.     Past Medical History  Diagnosis Date  . COPD (chronic obstructive pulmonary disease)   . Hypertension   . Diabetes mellitus 09/13/2010    glucophage  . Gout   . Oxygen dependent     pt uses O2   . Pericardial effusion 08/2011    small residual on echo  . Hyperlipidemia   . Personal history of noncompliance with medical treatment, presenting hazards to health   . Unspecified disease of pericardium   . Hemorrhage of rectum and anus   . Panic disorder without agoraphobia   . Depressive disorder, not elsewhere classified   . Anxiety state, unspecified   . Unspecified hereditary and idiopathic peripheral neuropathy   . Obesity, unspecified   . Unspecified tinnitus   . Unspecified essential hypertension   . Pneumonia, organism unspecified   . Chronic obstructive asthma, unspecified   . Acute and chronic respiratory failure   . Urinary frequency   . Urgency of urination   . Anemia due to chronic blood loss 01/04/2013     Past Surgical History  Procedure Laterality Date  . Breast biopsy      both breasts  . Total abdominal hysterectomy    . Esophagogastroduodenoscopy N/A 01/03/2013    Procedure: ESOPHAGOGASTRODUODENOSCOPY (EGD);  Surgeon: Inda Castle, MD;  Location: [facility];  Service: Endoscopy;  Laterality: N/A;  . Colonoscopy N/A 01/05/2013    Procedure: COLONOSCOPY;  Surgeon: Gatha Mayer, MD;  Location: [place];  Service: Endoscopy;  Laterality: N/A;  . Subxyphoid pericardial window N/A 05/11/2014    Procedure: SUBXYPHOID PERICARDIAL WINDOW;  Surgeon: Melrose Nakayama, MD;  Location: [place];  Service: Thoracic;  Laterality: N/A;  . Tee without cardioversion N/A 05/11/2014    Procedure: TRANSESOPHAGEAL ECHOCARDIOGRAM (TEE);  Surgeon: Melrose Nakayama, MD;  Location: [facility];  Service: Thoracic;  Laterality: N/A;  . Pericardial window N/A 06/08/2014    Procedure: PERICARDIAL WINDOW;  Surgeon: Melrose Nakayama, MD;  Location: [place];  Service: Thoracic;  Laterality: N/A;  . Video assisted thoracoscopy (vats)/thorocotomy Left 06/08/2014    Procedure: VIDEO ASSISTED THORACOSCOPY (VATS);  Surgeon: Melrose Nakayama, MD;  Location: [place];  Service: Thoracic;  Laterality: Left;      Social History:  reports that she quit smoking about 10 years ago. Her smoking use included Cigarettes. She has a 30 pack-year smoking history. She has never used smokeless tobacco. She reports that she drinks about 1.0 oz of alcohol per week. She reports that she does not use illicit drugs.    Allergies  Allergen Reactions  . Lisinopril Swelling and Other (See Comments)    Angioedema   . Advair Diskus [Fluticasone-Salmeterol] Other (See Comments)    Nose bleeds.   . Levaquin [Levofloxacin] Hives and Other (See Comments)    White marks inside mouth  . Penicillins Hives    Family History  Problem Relation Age of Onset  . COPD Mother   . COPD Sister   . Hypertension Sister   . Cancer Father   . Asthma Father   . Diabetes Other     3 maternal aunts  . Prostate cancer Paternal Uncle      Prior to Admission medications   Medication Sig Start Date End Date Taking? Authorizing Provider  albuterol (PROVENTIL) (2.5 MG/3ML) 0.083% nebulizer solution Take 3 mLs (2.5 mg total) by nebulization every 3 (three) hours as needed for wheezing or shortness of breath. 05/30/14  Yes Marijean Heath, NP  ALPRAZolam Duanne Moron) 0.5 MG tablet 1 by mouth every 8 hours as needed for anxiety Patient taking differently: Take 0.5 mg by mouth every 8 (eight) hours as needed for anxiety.  06/27/14  Yes [name] L Reed, DO  arformoterol (BROVANA) 15 MCG/2ML NEBU Take 2 mLs (15 mcg total) by nebulization 2 (two) times daily. 05/30/14  Yes Marijean Heath, NP  budesonide (PULMICORT) 0.25 MG/2ML nebulizer solution Take 2 mLs (0.25 mg total) by nebulization 2 (two) times daily. 06/06/14  Yes Tammy S Parrett, NP  colchicine 0.6  MG tablet Take 1 tablet (0.6 mg total) by mouth 2 (two) times daily. Patient taking differently: Take 0.6 mg by mouth as needed (for gout flare up).  06/24/14  Yes Donita Brooks, NP  feeding supplement, GLUCERNA SHAKE, (GLUCERNA SHAKE) LIQD Take 237 mLs by mouth 3 (three) times daily between meals. 05/30/14  Yes Marijean Heath, NP  hydrALAZINE (APRESOLINE) 25 MG tablet Take 1 tablet (25 mg total) by mouth every 8 (eight) hours. 01/21/14  Yes [name] L Reed, DO  insulin aspart (NOVOLOG) 100 UNIT/ML injection SQ qhs   CBG 70 - 120: 0 units CBG 121 - 150: 0 units CBG 151 - 200: 0 units CBG 201 - 250: 2 units CBG 251 - 300: 3 units CBG 301 - 350: 4 units CBG 351 - 400: 5 units CBG > 400: call MD 05/30/14  Yes Marijean Heath, NP  insulin aspart (NOVOLOG) 100 UNIT/ML injection SQ TID with meals   CBG 70 - 120: 0 units CBG 121 - 150: 2 units CBG 151 - 200: 3 units CBG 201 - 250: 5 units CBG 251 - 300: 8 units CBG 301 - 350: 11 units CBG 351 - 400: 15 units CBG > 400: call MD 05/30/14  Yes Marijean Heath, NP  lovastatin (ALTOPREV) 40 MG 24 hr tablet Take 40 mg by mouth at bedtime.   Yes Historical Provider, MD  Multiple Vitamins-Minerals (MULTIVITAMIN ADULTS 50+) TABS Take 1 tablet by mouth daily.   Yes Historical Provider, MD  oxyCODONE (OXY IR/ROXICODONE) 5 MG immediate release tablet Take 1-2 tablets (5-10 mg total) by mouth every 4 (four) hours as needed for severe pain. 06/27/14  Yes [name] L Reed, DO  pantoprazole (PROTONIX) 40 MG tablet Take 1 tablet (40 mg total) by mouth daily. 02/16/14  Yes Jerene Bears, MD  senna-docusate (SENOKOT-S) 8.6-50 MG per tablet Take 1 tablet by mouth at bedtime. 06/24/14  Yes Donita Brooks, NP  sertraline (ZOLOFT) 50 MG tablet Take 1 tablet (50 mg total) by mouth at bedtime. 01/21/14  Yes [name] L Reed, DO  tiotropium (SPIRIVA) 18 MCG inhalation capsule Place 1 capsule (18 mcg total) into inhaler and inhale daily. 06/24/14  Yes Donita Brooks, NP   traMADol (ULTRAM) 50 MG tablet Take 1-2 tablets (50-100 mg total) by mouth every 6 (six) hours as needed (mild pain). 06/27/14  Yes [name] L Reed, DO  lovastatin (MEVACOR) 40 MG tablet Take 1 tablet (40 mg total) by mouth at bedtime. For hyperlipedemia Patient not taking: Reported on 06/30/2014 10/27/13   Gayland Curry, DO     Physical Exam: Filed Vitals:   06/30/14 1645 06/30/14 1715 06/30/14 1716 06/30/14 1730  BP: 119/68 118/71  118/65  Pulse: 99 99  96  Temp:      TempSrc:      Resp: 22 20  26   Height:      Weight:  SpO2:  100% 96% 98%     Constitutional: Vital signs reviewed. Chronically ill-appearing  cooperative with exam. Alert and oriented x3.  Head: Normocephalic and atraumatic  Ear: TM normal bilaterally  Mouth: no erythema or exudates, MMM  Eyes: PERRL, EOMI, conjunctivae normal, No scleral icterus.  Neck: Supple, Trachea midline normal ROM, No JVD, mass, thyromegaly, or carotid bruit present.  Cardiovascular: Normal rate and regular rhythm.  Murmur heard. Pulmonary/Chest: She is in respiratory distress. She has wheezes.  RR - 20-25, with accessory muscle use. Abdominal: Soft. Non-tender, non-distended, bowel sounds are normal, no masses, organomegaly, or guarding present.  GU: no CVA tenderness Musculoskeletal: No joint deformities, erythema, or stiffness, ROM full and no nontender Ext: no edema and no cyanosis, pulses palpable bilaterally (DP and PT)  Hematology: no cervical, inginal, or axillary adenopathy.  Neurological: A&O x3, Strenght is normal and symmetric bilaterally, cranial nerve II-XII are grossly intact, no focal motor deficit, sensory intact to light touch bilaterally.  Skin: Warm, dry and intact. No rash, cyanosis, or clubbing.  Psychiatric: Normal mood and affect. speech and behavior is normal. Judgment and thought content normal. Cognition and memory are normal.       Labs on Admission:    Basic Metabolic Panel:  Recent Labs Lab  06/30/14 1502  NA 140  K 4.3  CL 98  CO2 34*  GLUCOSE 121*  BUN 18  CREATININE 1.52*  CALCIUM 8.9   Liver Function Tests: No results for input(s): AST, ALT, ALKPHOS, BILITOT, PROT, ALBUMIN in the last 168 hours. No results for input(s): LIPASE, AMYLASE in the last 168 hours. No results for input(s): AMMONIA in the last 168 hours. CBC:  Recent Labs Lab 06/30/14 1502  WBC 7.5  NEUTROABS 5.5  HGB 9.2*  HCT 30.7*  MCV 90.3  PLT 361   Cardiac Enzymes: No results for input(s): CKTOTAL, CKMB, CKMBINDEX, TROPONINI in the last 168 hours.  BNP (last 3 results)  Recent Labs  04/18/14 2005 05/13/14 0500  PROBNP 554.9* 1907.0*      CBG:  Recent Labs Lab 06/23/14 2205 06/24/14 0719 06/24/14 1152 06/30/14 1725  GLUCAP 98 106* 87 109*    Radiological Exams on Admission: Dg Chest 2 View  06/30/2014   CLINICAL DATA:  Shortness of breath with productive cough for 2 weeks. History of COPD.  EXAM: CHEST  2 VIEW  COMPARISON:  06/24/2014  FINDINGS: The cardiac silhouette remains enlarged, not significantly changed. There are small bilateral pleural effusions, slightly increased from prior. Bibasilar lung opacities have mildly increased from prior, particularly in the left lower lobe. No pneumothorax is identified. No acute osseous abnormality is seen.  IMPRESSION: 1. Small bilateral pleural effusions, slightly increased from prior. 2. Increased bibasilar opacities, which may reflect atelectasis versus infection.   Electronically Signed   By: Logan Bores   On: 06/30/2014 14:49    EKG: Independently reviewed.    Assessment/Plan Active Problems:   COPD exacerbation   COPD with acute exacerbation   Acute on Chronic Hypoxic / Hypercarbic Respiratory Failure Severe COPD Oxygen Dependent (3L Baseline) Continue patient on BiPAP, repeat ABG tonight Chest x-ray shows diffuse infiltrates, however afebrile, normal white count Currently the patient nothing by mouth, Therefore not  being initiated on empiric antibiotics, repeat chest x-ray in the morning Initiated on continuous nebulizer, continue when necessary nebulizers and, Brovana, Spiriva, Pulmicort If no improvement consult pulmonary in the morning Symptoms could have been exacerbated because of the use of Xanax and narcotics Repeat 2-D  echo in the morning to rule out recurrence of pericardial effusion , no evidence of tamponade    Acute kidney injury Slightly above baseline of 1.3 Continue to monitor   history of recurrent pericardial effusion, status post pericardial window 2  Diabetes mellitus type 2 Start the patient on sliding scale insulin  Anemia Continue to monitor Hemoglobin close to baseline of 9.9   Code Status:   DNR/DNI family Communication  No family by the bedside  Plan: admit   Time spent: 70 mins   [facility] Pager [phone]  If 7PM-7AM, please contact night-coverage [email or web address] Password [email or web address] 06/30/2014, 6:10 PM

## 2014-06-30 NOTE — ED Notes (Signed)
Per EMS, patient lives at [facility] and they were called out for patient having increased shortness of breath this morning.   Patient does have COPD, and claims she is "always short of breath.  I've been short of breath since I was diagnosed."   Patient states after receiving breathing treatments from EMS, she is "feeling back to normal at this time and not having any shortness of breath right now".   Per EMS, patient does have problems with anxiety and she was anxious upon their arrival to pick up patient.  Patient denies other symptoms.

## 2014-06-30 NOTE — Progress Notes (Signed)
Pt is from [facility] and was scheduled to be d/c from that facility on 07/01/14.  Per facility lianson, Gayla Medicus, pt's [facility] has already been arranged through [facility].

## 2014-06-30 NOTE — Progress Notes (Addendum)
CRITICAL VALUE ALERT  Critical value received:  Arterial Blood Gas - pH 7.12, CO2 106, Bicarbonate 33.3   Date of notification:  1/14  Time of notification:  2330  Critical value read back:Yes.    Nurse who received alert:   Doretha Sou, RN   MD notified (1st page):  NP Baltazar Najjar  Time of first page:  2332  Time MD responded:  2335  NP Baltazar Najjar advised will come see patient and requested respiratory therapy by called to discuss adjusting Bi-Pap settings.  Vicie Mutters, RN

## 2014-06-30 NOTE — Progress Notes (Signed)
Patient ID: [name], female   DOB: [birth year], 69 y.o.   MRN: [id number]  [name] living [place]     Allergies  Allergen Reactions  . Lisinopril Swelling    Angioedema   . Advair Diskus [Fluticasone-Salmeterol] Other (See Comments)    Nose bleeds.   . Penicillins     hives  . Levaquin [Levofloxacin] Hives    White marks inside mouth       Chief Complaint  Patient presents with  . Discharge Note    HPI:  She has been hospitalized; however; she is wanting to go home this week. She states that she wanted to go home straight from the hospital but felt as though she wasn't given the choice. She states she is going home no matter what. She will need home health for pt/ot/rn-lung program. She will need her prescriptions to be written and will need follow up with her pcp. She will not need dme.   Past Medical History  Diagnosis Date  . COPD (chronic obstructive pulmonary disease)   . Hypertension   . Diabetes mellitus 09/13/2010    glucophage  . Gout   . Oxygen dependent     pt uses O2   . Pericardial effusion 08/2011    small residual on echo  . Hyperlipidemia   . Personal history of noncompliance with medical treatment, presenting hazards to health   . Unspecified disease of pericardium   . Hemorrhage of rectum and anus   . Panic disorder without agoraphobia   . Depressive disorder, not elsewhere classified   . Anxiety state, unspecified   . Unspecified hereditary and idiopathic peripheral neuropathy   . Obesity, unspecified   . Unspecified tinnitus   . Unspecified essential hypertension   . Pneumonia, organism unspecified   . Chronic obstructive asthma, unspecified   . Acute and chronic respiratory failure   . Urinary frequency   . Urgency of urination   . Anemia due to chronic blood loss 01/04/2013    Past Surgical History  Procedure Laterality Date  . Breast biopsy      both breasts  . Total abdominal hysterectomy    .  Esophagogastroduodenoscopy N/A 01/03/2013    Procedure: ESOPHAGOGASTRODUODENOSCOPY (EGD);  Surgeon: Inda Castle, MD;  Location: [place];  Service: Endoscopy;  Laterality: N/A;  . Colonoscopy N/A 01/05/2013    Procedure: COLONOSCOPY;  Surgeon: Gatha Mayer, MD;  Location: [place];  Service: Endoscopy;  Laterality: N/A;  . Subxyphoid pericardial window N/A 05/11/2014    Procedure: SUBXYPHOID PERICARDIAL WINDOW;  Surgeon: Melrose Nakayama, MD;  Location: [place];  Service: Thoracic;  Laterality: N/A;  . Tee without cardioversion N/A 05/11/2014    Procedure: TRANSESOPHAGEAL ECHOCARDIOGRAM (TEE);  Surgeon: Melrose Nakayama, MD;  Location: [place];  Service: Thoracic;  Laterality: N/A;  . Pericardial window N/A 06/08/2014    Procedure: PERICARDIAL WINDOW;  Surgeon: Melrose Nakayama, MD;  Location: [place] ;  Service: Thoracic;  Laterality: N/A;  . Video assisted thoracoscopy (vats)/thorocotomy Left 06/08/2014    Procedure: VIDEO ASSISTED THORACOSCOPY (VATS);  Surgeon: Melrose Nakayama, MD;  Location: [facility] OR;  Service: Thoracic;  Laterality: Left;    VITAL SIGNS BP 148/66 mmHg  Pulse 100  Ht 5\' 2"  (1.575 m)  Wt 164 lb (74.39 kg)  BMI 29.99 kg/m2  SpO2 93%   Outpatient Encounter Prescriptions as of 06/27/2014  Medication Sig  . albuterol (PROVENTIL) (2.5 MG/3ML) 0.083% nebulizer solution Take 3 mLs (2.5 mg total) by  nebulization every 3 (three) hours as needed for wheezing or shortness of breath.  . ALPRAZolam (XANAX) 0.5 MG tablet 1 by mouth every 8 hours as needed for anxiety  . arformoterol (BROVANA) 15 MCG/2ML NEBU Take 2 mLs (15 mcg total) by nebulization 2 (two) times daily.  . budesonide (PULMICORT) 0.25 MG/2ML nebulizer solution Take 2 mLs (0.25 mg total) by nebulization 2 (two) times daily.  . colchicine 0.6 MG tablet Take 1 tablet (0.6 mg total) by mouth 2 (two) times daily.  . feeding supplement, GLUCERNA SHAKE, (GLUCERNA SHAKE) LIQD Take 237 mLs by mouth 3  (three) times daily between meals.  . hydrALAZINE (APRESOLINE) 25 MG tablet Take 1 tablet (25 mg total) by mouth every 8 (eight) hours.  . insulin aspart (NOVOLOG) 100 UNIT/ML injection SQ qhs   CBG 70 - 120: 0 units CBG 121 - 150: 0 units CBG 151 - 200: 0 units CBG 201 - 250: 2 units CBG 251 - 300: 3 units CBG 301 - 350: 4 units CBG 351 - 400: 5 units CBG > 400: call MD  . insulin aspart (NOVOLOG) 100 UNIT/ML injection SQ TID with meals   CBG 70 - 120: 0 units CBG 121 - 150: 2 units CBG 151 - 200: 3 units CBG 201 - 250: 5 units CBG 251 - 300: 8 units CBG 301 - 350: 11 units CBG 351 - 400: 15 units CBG > 400: call MD  . lovastatin (MEVACOR) 40 MG tablet Take 1 tablet (40 mg total) by mouth at bedtime. For hyperlipedemia  . Multiple Vitamins-Minerals (MULTIVITAMIN ADULTS 50+) TABS Take 1 tablet by mouth daily.  [name] oxyCODONE (OXY IR/ROXICODONE) 5 MG immediate release tablet Take 1-2 tablets (5-10 mg total) by mouth every 4 (four) hours as needed for severe pain.  . pantoprazole (PROTONIX) 40 MG tablet Take 1 tablet (40 mg total) by mouth daily.  [name] senna-docusate (SENOKOT-S) 8.6-50 MG per tablet Take 1 tablet by mouth at bedtime.  . sertraline (ZOLOFT) 50 MG tablet Take 1 tablet (50 mg total) by mouth at bedtime.  [name] tiotropium (SPIRIVA) 18 MCG inhalation capsule Place 1 capsule (18 mcg total) into inhaler and inhale daily.  . traMADol (ULTRAM) 50 MG tablet Take 1-2 tablets (50-100 mg total) by mouth every 6 (six) hours as needed (mild pain).     SIGNIFICANT DIAGNOSTIC EXAMS  05-01-14: left ankle x-ray: Soft tissue swelling, without acute osseous finding.   05-01-14: left foot x-ray; Soft tissue swelling and degenerative change, without acute osseous abnormality  05-03-14: left lower extremity doppler: No evidence of deep vein or superficial thrombosis involving the left lower extremity and right common femoral vein. - No evidence of Baker&'s cyst on the left.  05-04-14: chest  x-ray: Cardiac enlargement with pulmonary vascular congestion, small bilateral pleural effusions, and basilar infiltrates similar to previous study  05-10-14: chest x-ray: 1. Unremarkable positioning of tubes and central line. 2. Pulmonary edema with layering effusions and bibasilar atelectasis. These changes could obscure pneumonia.  05-11-14: TEE: - Left ventricle: Systolic function was normal. The estimated ejection fraction was in the range of 50% to 55%. - Aortic valve: No evidence of vegetation. There was trivial regurgitation. - Mitral valve: No evidence of vegetation. Impressions: - Limited study for Subxyphoid Pericardial Window: There was a moderate to large pericardial effusion surrounding heart. Left ventricular function did improve after drainage of effusion. The patient tolerated the procedure well. At the end of the procedure the TEE was removed without difficulty. The patient  was later taken back to the SICU intubated in stable condition.  05-12-14: chest x-ray: No marked change in bilateral pleural effusions and right worse than left airspace disease. Marked enlargement of the cardiopericardial silhouette with a pericardial drain in place  05-15-14: chest x-ray: Bilateral small pleural effusion with bilateral basilar atelectasis or infiltrate. Persistent mild interstitial prominence and patchy airspace disease right lung. Asymmetric edema or pneumonia cannot be excluded    LABS REVIEWED:   05-10-14: wbc 6.3; hgb 8.6; hct 26.4; mcv 88.9; plt 205; glucose 122; bun 62; creat 2.06; k+4.3; na++145; liver normal albumin 2.6 05-11-14: wbc 6.0; hgb 7.9; hct 25.2; mcv 85.4; plt 198; glucose 117; bun 61; creat 1.99; k+4.4; na++146; phos 2.7; mag 2.5; RA factor: neg 05-13-14: wbc 10.2; hgb 9.8; hct 31.7; mcv 86.4; plt 228; glucose 124; bun 40; creat 1.45; k+3.7; na++149; liver normal albumin 2.5; BNP 1907  05-17-14: wbc 7.8;hgb 9.0; hct 30.0; mcv 88.5; plt 282; glucose 99; bun 18; creat  1.14; k+4.5; na++144; phos 3.2; mag 2.2      Review of Systems  Constitutional: Negative for malaise/fatigue.  Respiratory: Positive for shortness of breath. Negative for cough.   Cardiovascular: Negative for chest pain, palpitations and leg swelling.  Gastrointestinal: Negative for heartburn, abdominal pain and constipation.  Musculoskeletal: Negative for myalgias and joint pain.  Skin: Negative.   Neurological: Negative for headaches.  Psychiatric/Behavioral: Negative for depression. The patient is not nervous/anxious.      Physical Exam  Constitutional: She is oriented to person, place, and time. She appears well-developed. No distress.  Overweight  Neck: Neck supple. No JVD present. No thyromegaly present.  Cardiovascular: Normal rate, regular rhythm and intact distal pulses.   Respiratory: Effort normal. No respiratory distress.  Breath sounds diminished   GI: Soft. Bowel sounds are normal. She exhibits no distension. There is no tenderness.  Musculoskeletal: She exhibits no edema.  Lymphadenopathy:    She has no cervical adenopathy.  Neurological: She is alert and oriented to person, place, and time.  Skin: Skin is warm and dry. She is not diaphoretic.  Psychiatric: She has a normal mood and affect.       ASSESSMENT/ PLAN:  Will discharge her to home this week with home health for pt/ot/rn-lung program. She will not need dme.her prescriptions have been written for a 30 day supply of her medications with #30 xanax 0.5 mg tabs and #30 oxycodone 5 mg tabs. She has a follow up appointment with Dr. Mariea Clonts set up.    Time spent with patient 40 minutes.    Ok Edwards NP [facility]  Contact [phone] Monday through Friday 8am- 5pm  After hours call [phone]

## 2014-06-30 NOTE — ED Provider Notes (Signed)
CSN: [id number]     Arrival date & time 06/30/14  1220 History   First MD Initiated Contact with Patient 06/30/14 1223     Chief Complaint  Patient presents with  . Shortness of Breath     (Consider location/radiation/quality/duration/timing/severity/associated sxs/prior Treatment) HPI Comments: 69 y.o. y/o female, SNF resident ([facility]) with a PMH of HLD, DM, Significant anxiety, Gout, HTN, obesity, Asthma, COPD on 3L O2 and recent admissions x 2 for pericardial effusions.  Pt sent here from SNF for increased work of breathing and tachycardia. Of note, pt was recently admitted for SOB and needed percutaneous drainage and pericardial window approximately 2-3. Pt has no complains, but the nurse at her facility [facility]) was concerned about her heart rate and RR this morning. Though Pt states she does not feel more SOB than is normal for her on home 2L O2, during history she pauses between words to take breaths and appears to engage accessory respiratory muscles, and also appears slightly somnolent.   Pt denies fever, chills, dysuria, cough/congestion, CP, n/v/d/c, HA, neck stiffness, belly pain. Pt has been eating less than normal and endorses 20 lb wt loss over the past couple of months. She denies Hx of cardiac problems other than the fluid drained 2 weeks ago, denies Hx of liver or kidney dz.   Patient is a 69 y.o. female presenting with shortness of breath. The history is provided by the patient and medical records.  Shortness of Breath Associated symptoms: wheezing   Associated symptoms: no abdominal pain, no chest pain, no cough, no neck pain and no vomiting     Past Medical History  Diagnosis Date  . COPD (chronic obstructive pulmonary disease)   . Hypertension   . Diabetes mellitus 09/13/2010    glucophage  . Gout   . Oxygen dependent     pt uses O2   . Pericardial effusion 08/2011    small residual on echo  . Hyperlipidemia   . Personal history of noncompliance  with medical treatment, presenting hazards to health   . Unspecified disease of pericardium   . Hemorrhage of rectum and anus   . Panic disorder without agoraphobia   . Depressive disorder, not elsewhere classified   . Anxiety state, unspecified   . Unspecified hereditary and idiopathic peripheral neuropathy   . Obesity, unspecified   . Unspecified tinnitus   . Unspecified essential hypertension   . Pneumonia, organism unspecified   . Chronic obstructive asthma, unspecified   . Acute and chronic respiratory failure   . Urinary frequency   . Urgency of urination   . Anemia due to chronic blood loss 01/04/2013   Past Surgical History  Procedure Laterality Date  . Breast biopsy      both breasts  . Total abdominal hysterectomy    . Esophagogastroduodenoscopy N/A 01/03/2013    Procedure: ESOPHAGOGASTRODUODENOSCOPY (EGD);  Surgeon: Inda Castle, MD;  Location: [place];  Service: Endoscopy;  Laterality: N/A;  . Colonoscopy N/A 01/05/2013    Procedure: COLONOSCOPY;  Surgeon: Gatha Mayer, MD;  Location: [place];  Service: Endoscopy;  Laterality: N/A;  . Subxyphoid pericardial window N/A 05/11/2014    Procedure: SUBXYPHOID PERICARDIAL WINDOW;  Surgeon: Melrose Nakayama, MD;  Location: [place];  Service: Thoracic;  Laterality: N/A;  . Tee without cardioversion N/A 05/11/2014    Procedure: TRANSESOPHAGEAL ECHOCARDIOGRAM (TEE);  Surgeon: Melrose Nakayama, MD;  Location: [place];  Service: Thoracic;  Laterality: N/A;  . Pericardial window N/A  06/08/2014    Procedure: PERICARDIAL WINDOW;  Surgeon: Melrose Nakayama, MD;  Location: [place];  Service: Thoracic;  Laterality: N/A;  . Video assisted thoracoscopy (vats)/thorocotomy Left 06/08/2014    Procedure: VIDEO ASSISTED THORACOSCOPY (VATS);  Surgeon: Melrose Nakayama, MD;  [facility];  Service: Thoracic;  Laterality: Left;   Family History  Problem Relation Age of Onset  . COPD Mother   . COPD Sister   .  Hypertension Sister   . Cancer Father   . Asthma Father   . Diabetes Other     3 maternal aunts  . Prostate cancer Paternal Uncle    History  Substance Use Topics  . Smoking status: Former Smoker -- 1.00 packs/day for 30 years    Types: Cigarettes    Quit date: 06/17/2004  . Smokeless tobacco: Never Used  . Alcohol Use: 1.0 oz/week    2 drink(s) per week     Comment: none iin a month   OB History    No data available     Review of Systems  Constitutional: Positive for activity change.  HENT: Negative for facial swelling.   Respiratory: Positive for shortness of breath and wheezing. Negative for cough.   Cardiovascular: Negative for chest pain.  Gastrointestinal: Negative for nausea, vomiting, abdominal pain, diarrhea, constipation, blood in stool and abdominal distention.  Genitourinary: Negative for hematuria and difficulty urinating.  Musculoskeletal: Negative for neck pain.  Skin: Negative for color change.  Neurological: Negative for speech difficulty.  Hematological: Does not bruise/bleed easily.  Psychiatric/Behavioral: Negative for confusion.  All other systems reviewed and are negative.     Allergies  Lisinopril; Advair diskus; Levaquin; and Penicillins  Home Medications   Prior to Admission medications   Medication Sig Start Date End Date Taking? Authorizing Provider  albuterol (PROVENTIL) (2.5 MG/3ML) 0.083% nebulizer solution Take 3 mLs (2.5 mg total) by nebulization every 3 (three) hours as needed for wheezing or shortness of breath. 05/30/14  Yes Marijean Heath, NP  ALPRAZolam Duanne Moron) 0.5 MG tablet 1 by mouth every 8 hours as needed for anxiety Patient taking differently: Take 0.5 mg by mouth every 8 (eight) hours as needed for anxiety.  06/27/14  Yes [name] L Reed, DO  arformoterol (BROVANA) 15 MCG/2ML NEBU Take 2 mLs (15 mcg total) by nebulization 2 (two) times daily. 05/30/14  Yes Marijean Heath, NP  budesonide (PULMICORT) 0.25 MG/2ML  nebulizer solution Take 2 mLs (0.25 mg total) by nebulization 2 (two) times daily. 06/06/14  Yes Tammy S Parrett, NP  colchicine 0.6 MG tablet Take 1 tablet (0.6 mg total) by mouth 2 (two) times daily. Patient taking differently: Take 0.6 mg by mouth as needed (for gout flare up).  06/24/14  Yes Donita Brooks, NP  feeding supplement, GLUCERNA SHAKE, (GLUCERNA SHAKE) LIQD Take 237 mLs by mouth 3 (three) times daily between meals. 05/30/14  Yes Marijean Heath, NP  hydrALAZINE (APRESOLINE) 25 MG tablet Take 1 tablet (25 mg total) by mouth every 8 (eight) hours. 01/21/14  Yes [name] L Reed, DO  insulin aspart (NOVOLOG) 100 UNIT/ML injection SQ qhs   CBG 70 - 120: 0 units CBG 121 - 150: 0 units CBG 151 - 200: 0 units CBG 201 - 250: 2 units CBG 251 - 300: 3 units CBG 301 - 350: 4 units CBG 351 - 400: 5 units CBG > 400: call MD 05/30/14  Yes Marijean Heath, NP  insulin aspart (NOVOLOG) 100 UNIT/ML injection SQ TID  with meals   CBG 70 - 120: 0 units CBG 121 - 150: 2 units CBG 151 - 200: 3 units CBG 201 - 250: 5 units CBG 251 - 300: 8 units CBG 301 - 350: 11 units CBG 351 - 400: 15 units CBG > 400: call MD 05/30/14  Yes Marijean Heath, NP  lovastatin (ALTOPREV) 40 MG 24 hr tablet Take 40 mg by mouth at bedtime.   Yes Historical Provider, MD  Multiple Vitamins-Minerals (MULTIVITAMIN ADULTS 50+) TABS Take 1 tablet by mouth daily.   Yes Historical Provider, MD  oxyCODONE (OXY IR/ROXICODONE) 5 MG immediate release tablet Take 1-2 tablets (5-10 mg total) by mouth every 4 (four) hours as needed for severe pain. 06/27/14  Yes [name] L Reed, DO  pantoprazole (PROTONIX) 40 MG tablet Take 1 tablet (40 mg total) by mouth daily. 02/16/14  Yes Jerene Bears, MD  senna-docusate (SENOKOT-S) 8.6-50 MG per tablet Take 1 tablet by mouth at bedtime. 06/24/14  Yes Donita Brooks, NP  sertraline (ZOLOFT) 50 MG tablet Take 1 tablet (50 mg total) by mouth at bedtime. 01/21/14  Yes [name] L Reed, DO   tiotropium (SPIRIVA) 18 MCG inhalation capsule Place 1 capsule (18 mcg total) into inhaler and inhale daily. 06/24/14  Yes Donita Brooks, NP  traMADol (ULTRAM) 50 MG tablet Take 1-2 tablets (50-100 mg total) by mouth every 6 (six) hours as needed (mild pain). 06/27/14  Yes [name] L Reed, DO  lovastatin (MEVACOR) 40 MG tablet Take 1 tablet (40 mg total) by mouth at bedtime. For hyperlipedemia Patient not taking: Reported on 06/30/2014 10/27/13   [name] L Reed, DO   BP 108/56 mmHg  Pulse 100  Temp(Src) 98.4 F (36.9 C) (Oral)  Resp 24  Ht 5\' 2"  (1.575 m)  Wt 162 lb (73.483 kg)  BMI 29.62 kg/m2  SpO2 91% Physical Exam  Constitutional: She is oriented to person, place, and time. She appears well-developed and well-nourished.  HENT:  Head: Normocephalic and atraumatic.  Eyes: EOM are normal. Pupils are equal, round, and reactive to light.  Neck: Neck supple.  Cardiovascular: Normal rate and regular rhythm.   Murmur heard. Pulmonary/Chest: She is in respiratory distress. She has wheezes.  RR - 20-25, with accessory muscle use.  Abdominal: Soft. She exhibits no distension. There is no tenderness. There is no rebound and no guarding.  Neurological: She is alert and oriented to person, place, and time.  Skin: Skin is warm and dry.  Nursing note and vitals reviewed.   ED Course  Procedures (including critical care time) Labs Review Labs Reviewed  CBC WITH DIFFERENTIAL - Abnormal; Notable for the following:    RBC 3.40 (*)    Hemoglobin 9.2 (*)    HCT 30.7 (*)    RDW 16.5 (*)    All other components within normal limits  BASIC METABOLIC PANEL - Abnormal; Notable for the following:    CO2 34 (*)    Glucose, Bld 121 (*)    Creatinine, Ser 1.52 (*)    GFR calc non Af Amer 34 (*)    GFR calc Af Amer 40 (*)    All other components within normal limits  BRAIN NATRIURETIC PEPTIDE - Abnormal; Notable for the following:    B Natriuretic Peptide 493.8 (*)    All other components within  normal limits  I-STAT CG4 LACTIC ACID, ED - Abnormal; Notable for the following:    Lactic Acid, Venous <0.30 (*)    All other  components within normal limits  I-STAT ARTERIAL BLOOD GAS, ED - Abnormal; Notable for the following:    pH, Arterial 7.230 (*)    pCO2 arterial 84.0 (*)    pO2, Arterial 74.0 (*)    Bicarbonate 35.2 (*)    Acid-Base Excess 6.0 (*)    All other components within normal limits  BLOOD GAS, ARTERIAL  I-STAT TROPOININ, ED    Imaging Review Dg Chest 2 View  06/30/2014   CLINICAL DATA:  Shortness of breath with productive cough for 2 weeks. History of COPD.  EXAM: CHEST  2 VIEW  COMPARISON:  06/24/2014  FINDINGS: The cardiac silhouette remains enlarged, not significantly changed. There are small bilateral pleural effusions, slightly increased from prior. Bibasilar lung opacities have mildly increased from prior, particularly in the left lower lobe. No pneumothorax is identified. No acute osseous abnormality is seen.  IMPRESSION: 1. Small bilateral pleural effusions, slightly increased from prior. 2. Increased bibasilar opacities, which may reflect atelectasis versus infection.   Electronically Signed   By: Logan Bores   On: 06/30/2014 14:49     EKG Interpretation   Date/Time:  Thursday June 30 2014 14:11:23 EST Ventricular Rate:  106 PR Interval:  175 QRS Duration: 94 QT Interval:  343 QTC Calculation: 455 R Axis:   -31 Text Interpretation:  Sinus tachycardia Probable left atrial enlargement  Left axis deviation Anteroseptal infarct, age indeterminate No acute  changes Confirmed by Kathrynn Humble, MD, Thelma Comp [phone]) on 06/30/2014 3:04:16 PM      MDM   Final diagnoses:  Acute respiratory failure with hypercapnia  Pleural effusion      EMERGENCY DEPARTMENT [place] CARDIAC EXAM "Study: Limited Ultrasound of the heart and pericardium"  INDICATIONS:Dyspnea Multiple views of the heart and pericardium were obtained in real-time with a multi-frequency  probe.  PERFORMED CX:KGYJEH  IMAGES ARCHIVED?: Yes  FINDINGS: Small effusion, Normal contractility and Tamponade physiology absent  LIMITATIONS:  Emergent procedure  VIEWS USED: Subcostal 4 chamber, Parasternal long axis and Parasternal short axis  INTERPRETATION: Cardiac activity present, Cardiac tamponade absent and Normal contractility    CRITICAL CARE Performed by: Varney Biles   Total critical care time: 50 min  Critical care time was exclusive of separately billable procedures and treating other patients.  Critical care was necessary to treat or prevent imminent or life-threatening deterioration.  Critical care was time spent personally by me on the following activities: development of treatment plan with patient and/or surrogate as well as nursing, discussions with consultants, evaluation of patient's response to treatment, examination of patient, obtaining history from patient or surrogate, ordering and performing treatments and interventions, ordering and review of laboratory studies, ordering and review of radiographic studies, pulse oximetry and re-evaluation of patient's condition.   Pt comes in with cc of dyspnea. She is noted to be in mild resp distress, and also somnolent. ABG shows acute hypercapnic resp failure, likely from COPD exacerbation. She also has increased pleural effusion, but not significant in volume overall. Likely from the recent procedures, rather than infection, since there is no fevers, no cough. She had chest tubes, so infection/empyema still possible - but we will ask admitting team to monitor closely, as antibiotics will be deferred from the ER.  Bipap and breathing tx started, with ivf and steroids.   Varney Biles, MD 06/30/14 [phone]

## 2014-06-30 NOTE — ED Notes (Signed)
Pt being transported to [facility] with RN and RT.  Belongings (shirt, jacket, glasses, slippers) placed in belongings bag and will go to unit with pt.

## 2014-06-30 NOTE — Progress Notes (Addendum)
Patient heart rate dropped in the 50's at 2034 and at 2043 patient's heart rate increased to the 150-170's for approximately 5 minutes. Patient was asymptomatic. Attempted to obtain an EKG but patient's heart rate returned to low 100's.  Text paged NP Baltazar Najjar to make aware. Vicie Mutters, RN

## 2014-06-30 NOTE — ED Notes (Signed)
RT at bedside to initiate BiPap

## 2014-07-01 ENCOUNTER — Inpatient Hospital Stay (HOSPITAL_COMMUNITY): Payer: Medicare Other

## 2014-07-01 DIAGNOSIS — J441 Chronic obstructive pulmonary disease with (acute) exacerbation: Principal | ICD-10-CM

## 2014-07-01 DIAGNOSIS — I319 Disease of pericardium, unspecified: Secondary | ICD-10-CM

## 2014-07-01 DIAGNOSIS — I10 Essential (primary) hypertension: Secondary | ICD-10-CM

## 2014-07-01 DIAGNOSIS — J9622 Acute and chronic respiratory failure with hypercapnia: Secondary | ICD-10-CM

## 2014-07-01 DIAGNOSIS — E119 Type 2 diabetes mellitus without complications: Secondary | ICD-10-CM

## 2014-07-01 DIAGNOSIS — I369 Nonrheumatic tricuspid valve disorder, unspecified: Secondary | ICD-10-CM

## 2014-07-01 LAB — URINALYSIS, ROUTINE W REFLEX MICROSCOPIC
Glucose, UA: NEGATIVE mg/dL
Hgb urine dipstick: NEGATIVE
KETONES UR: NEGATIVE mg/dL
Leukocytes, UA: NEGATIVE
Nitrite: NEGATIVE
PH: 5 (ref 5.0–8.0)
PROTEIN: 30 mg/dL — AB
Specific Gravity, Urine: 1.019 (ref 1.005–1.030)
Urobilinogen, UA: 0.2 mg/dL (ref 0.0–1.0)

## 2014-07-01 LAB — COMPREHENSIVE METABOLIC PANEL
ALBUMIN: 2.4 g/dL — AB (ref 3.5–5.2)
ALK PHOS: 78 U/L (ref 39–117)
ALT: 40 U/L — ABNORMAL HIGH (ref 0–35)
ANION GAP: 6 (ref 5–15)
AST: 23 U/L (ref 0–37)
BILIRUBIN TOTAL: 0.5 mg/dL (ref 0.3–1.2)
BUN: 20 mg/dL (ref 6–23)
CO2: 37 mmol/L — ABNORMAL HIGH (ref 19–32)
Calcium: 9.1 mg/dL (ref 8.4–10.5)
Chloride: 97 mEq/L (ref 96–112)
Creatinine, Ser: 1.83 mg/dL — ABNORMAL HIGH (ref 0.50–1.10)
GFR calc non Af Amer: 27 mL/min — ABNORMAL LOW (ref >90)
GFR, EST AFRICAN AMERICAN: 32 mL/min — AB (ref >90)
Glucose, Bld: 123 mg/dL — ABNORMAL HIGH (ref 70–99)
Potassium: 4.8 mmol/L (ref 3.5–5.1)
Sodium: 140 mmol/L (ref 135–145)
Total Protein: 6 g/dL (ref 6.0–8.3)

## 2014-07-01 LAB — POCT I-STAT 3, ART BLOOD GAS (G3+)
Acid-Base Excess: 7 mmol/L — ABNORMAL HIGH (ref 0.0–2.0)
Bicarbonate: 33 mEq/L — ABNORMAL HIGH (ref 20.0–24.0)
O2 SAT: 67 %
PCO2 ART: 51.4 mmHg — AB (ref 35.0–45.0)
PO2 ART: 34 mmHg — AB (ref 80.0–100.0)
TCO2: 35 mmol/L (ref 0–100)
pH, Arterial: 7.414 (ref 7.350–7.450)

## 2014-07-01 LAB — GLUCOSE, CAPILLARY
Glucose-Capillary: 123 mg/dL — ABNORMAL HIGH (ref 70–99)
Glucose-Capillary: 115 mg/dL — ABNORMAL HIGH (ref 70–99)
Glucose-Capillary: 116 mg/dL — ABNORMAL HIGH (ref 70–99)
Glucose-Capillary: 124 mg/dL — ABNORMAL HIGH (ref 70–99)
Glucose-Capillary: 101 mg/dL — ABNORMAL HIGH (ref 70–99)
Glucose-Capillary: 121 mg/dL — ABNORMAL HIGH (ref 70–99)

## 2014-07-01 LAB — URINE MICROSCOPIC-ADD ON

## 2014-07-01 LAB — CBC
HCT: 30.8 % — ABNORMAL LOW (ref 36.0–46.0)
Hemoglobin: 9.1 g/dL — ABNORMAL LOW (ref 12.0–15.0)
MCH: 27.2 pg (ref 26.0–34.0)
MCHC: 29.5 g/dL — AB (ref 30.0–36.0)
MCV: 92.2 fL (ref 78.0–100.0)
Platelets: 353 10*3/uL (ref 150–400)
RBC: 3.34 MIL/uL — ABNORMAL LOW (ref 3.87–5.11)
RDW: 16.7 % — ABNORMAL HIGH (ref 11.5–15.5)
WBC: 6.1 10*3/uL (ref 4.0–10.5)

## 2014-07-01 LAB — TROPONIN I: Troponin I: 0.06 ng/mL — ABNORMAL HIGH (ref <0.031)

## 2014-07-01 LAB — TSH: TSH: 0.226 u[IU]/mL — ABNORMAL LOW (ref 0.350–4.500)

## 2014-07-01 MED ORDER — SODIUM CHLORIDE 0.9 % IV SOLN
INTRAVENOUS | Status: DC
  Administered 2014-07-01: 75 mL via INTRAVENOUS
  Administered 2014-07-02: 1000 mL via INTRAVENOUS

## 2014-07-01 MED ORDER — CHLORHEXIDINE GLUCONATE 0.12 % MT SOLN
15.0000 mL | Freq: Two times a day (BID) | OROMUCOSAL | Status: DC
  Administered 2014-07-01 – 2014-07-02 (×3): 15 mL via OROMUCOSAL
  Filled 2014-07-01 (×4): qty 15

## 2014-07-01 MED ORDER — SODIUM CHLORIDE 0.9 % IV SOLN
INTRAVENOUS | Status: DC
  Administered 2014-07-02: 10 mL/h via INTRAVENOUS
  Administered 2014-07-03: 21:00:00 via INTRAVENOUS

## 2014-07-01 MED ORDER — CETYLPYRIDINIUM CHLORIDE 0.05 % MT LIQD
7.0000 mL | Freq: Two times a day (BID) | OROMUCOSAL | Status: DC
  Administered 2014-07-01 – 2014-07-02 (×3): 7 mL via OROMUCOSAL

## 2014-07-01 NOTE — Progress Notes (Signed)
Patient ID: [name]  female  NID:[id number]    DOB: 21-Jun-1945    DOA: 06/30/2014  PCP: Hollace Kinnier, DO   Brief history of present illness  Patient is a 69 year old female, SNF resident with history hyperlipidemia, diabetes, anxiety, chronic respiratory failure on 3-4 L O2, severe COPD, 2 recent admissions for pericardial effusion status post pericardial window 05/11/14 and 12/23. Patient was discharged after a prolonged hospitalization, return back to the ED for shortness of breath, respiratory distress and tachycardia. Patient was placed on BiPAP in ED, was found to have hypercapnic respiratory failure. Echo in ED showed normal contractility without tamponade physiology. Patient was admitted to stepdown on BiPAP.  Assessment/Plan: Principal Problem:   Respiratory failure, acute on chronic with hypoxia, respiratory distress secondary to COPD with acute exacerbation - At the time of my examination, still on BiPAP however now alert and oriented, with repeat ABG, DC BiPAP if ready per RT - currently NPO, repeat 2-D echo today - Continue nebulizer, Brovana, Spiriva, Pulmicort - Overnight had come unresponsive at and was failing BiPAP, palliative medicine consult was placed for goals of care  Active Problems:   Pericardial effusion - Follow 2-D echo today, bedside echo in the ER showed no cardiac tamponade   Acute kidney injury - Worse today, creatinine worsened to 1.8, baseline 1.6. Place on gentle hydration.    Essential hypertension, benign BP currently stable-     Diabetes mellitus type II, controlled, with no complications - Continue sliding scale insulin   Elevated troponin - Likely due to demand ischemia due to #1, echocardiogram today  DVT Prophylaxis:heparin subcutaneous  Code Status:DO NOT RESUSCITATE   Family Communication:  Disposition:Monitor in stepdown today.    Consultants:  None   Procedures:  BiPAP    Antibiotics:  None    Subjective: Patient seen and examined, alert and oriented, still on BiPAP at the time of my examination   Objective: Weight change:   Intake/Output Summary (Last 24 hours) at 07/01/14 1111 Last data filed at 07/01/14 0600  Gross per 24 hour  Intake      0 ml  Output    100 ml  Net   -100 ml   Blood pressure 124/75, pulse 100, temperature 97.4 F (36.3 C), temperature source Oral, resp. rate 24, height 5\' 2"  (1.575 m), weight 73.483 kg (162 lb), SpO2 96 %.  Physical Exam: General: Alert and awake, oriente, on BiPAPNT: anicteric sclera, PERLA, EOMI CVS: S1-S2 clear, no murmur rubs or gallops ChestDecreased breath sound throughout Abdomen : soft nontender, nondistended, normal bowel sounds  Extremities: no cyanosis, clubbing or edema noted bilaterally   Lab Results: Basic Metabolic Panel:  Recent Labs Lab 06/30/14 1502 06/30/14 1736 07/01/14 0319  NA 140  --  140  K 4.3  --  4.8  CL 98  --  97  CO2 34*  --  37*  GLUCOSE 121*  --  123*  BUN 18  --  20  CREATININE 1.52* 1.60* 1.83*  CALCIUM 8.9  --  9.1   Liver Function Tests:  Recent Labs Lab 07/01/14 0319  AST 23  ALT 40*  ALKPHOS 78  BILITOT 0.5  PROT 6.0  ALBUMIN 2.4*   No results for input(s): LIPASE, AMYLASE in the last 168 hours. No results for input(s): AMMONIA in the last 168 hours. CBC:  Recent Labs Lab 06/30/14 1502 07/01/14 0319  WBC 7.5 6.1  NEUTROABS 5.5  --   HGB 9.2* 9.1*  HCT 30.7*  30.8*  MCV 90.3 92.2  PLT 361 353   Cardiac Enzymes:  Recent Labs Lab 06/30/14 1736 06/30/14 2258 07/01/14 0319  TROPONINI 0.08* 0.07* 0.06*   BNP: Invalid input(s): POCBNP CBG:  Recent Labs Lab 06/30/14 1725 06/30/14 2000 07/01/14 0109 07/01/14 0339 07/01/14 0744  GLUCAP 109* 118* 123* 115* 116*     Micro Results: Recent Results (from the past 240 hour(s))  Clostridium Difficile by PCR     Status: None   Collection Time: 06/23/14  4:24 AM  Result  Value Ref Range Status   C difficile by pcr NEGATIVE NEGATIVE Final  MRSA PCR Screening     Status: None   Collection Time: 06/30/14  6:14 PM  Result Value Ref Range Status   MRSA by PCR NEGATIVE NEGATIVE Final    Comment:        The GeneXpert MRSA Assay (FDA approved for NASAL specimens only), is one component of a comprehensive MRSA colonization surveillance program. It is not intended to diagnose MRSA infection nor to guide or monitor treatment for MRSA infections.     Studies/Results: Dg Chest 2 View  06/30/2014   CLINICAL DATA:  Shortness of breath with productive cough for 2 weeks. History of COPD.  EXAM: CHEST  2 VIEW  COMPARISON:  06/24/2014  FINDINGS: The cardiac silhouette remains enlarged, not significantly changed. There are small bilateral pleural effusions, slightly increased from prior. Bibasilar lung opacities have mildly increased from prior, particularly in the left lower lobe. No pneumothorax is identified. No acute osseous abnormality is seen.  IMPRESSION: 1. Small bilateral pleural effusions, slightly increased from prior. 2. Increased bibasilar opacities, which may reflect atelectasis versus infection.   Electronically Signed   By: Logan Bores   On: 06/30/2014 14:49   Dg Chest 2 View  06/24/2014   CLINICAL DATA:  Shortness of breath, cough, history of pericardial effusion. Recent chest tube removal.  EXAM: CHEST  2 VIEW  COMPARISON:  06/23/2014; 06/21/2014; 06/20/2014; 06/22/2014  FINDINGS: Grossly unchanged enlarged cardiac silhouette and mediastinal contours. The lungs remain hyperexpanded. Unchanged trace bilateral effusions. There is a small amount of fluid tracking within the bilateral major fissures. Interval removal of left-sided chest tube. No pneumothorax. Pulmonary venous congestion without frank evidence of edema. Grossly unchanged bilateral infrahilar heterogeneous opacities, right greater left. No new focal airspace opacities. Unchanged bones.  IMPRESSION:  1. Interval removal of left-sided chest tube.  No pneumothorax. 2. Similar findings of cardiomegaly, pulmonary venous congestion and trace bilateral effusions. 3. Grossly unchanged bilateral infrahilar heterogeneous opacities, right greater than left, likely atelectasis.   Electronically Signed   By: Sandi Mariscal M.D.   On: 06/24/2014 07:56   Dg Chest 2 View  06/07/2014   CLINICAL DATA:  Shortness of breath and weakness today; history of pericardial effusion with creation of pericardial window on May 11, 2014  EXAM: CHEST  2 VIEW  COMPARISON:  Portable chest x-ray of May 24, 2014.  FINDINGS: The cardiopericardial silhouette remains mildly enlarged. There remains prominence of the central pulmonary vascularity. There are small pleural effusions layering inferiorly. The pulmonary vascularity exhibits no significant cephalization. The trachea is midline. The bony thorax is unremarkable.  IMPRESSION: There is mild enlargement of the cardiac silhouette with central pulmonary vascular prominence without cephalization consistent with compensated low-grade CHF. There are small bilateral pleural effusions not greatly changed from the previous study. There is no definite pneumonia.   Electronically Signed   By: David  Martinique   On: 06/07/2014 12:22  Portable Chest 1 View  07/01/2014   CLINICAL DATA:  Shortness of breath.  EXAM: PORTABLE CHEST - 1 VIEW  COMPARISON:  06/30/2014.  FINDINGS: Mediastinum and hilar structures normal. Cardiomegaly with pulmonary vascular congestion and mild pulmonary infiltrates and pleural effusions consistent with congestive heart failure. Bilateral pneumonia cannot be excluded. Dense atelectasis left lung base. No pneumothorax. No acute osseus abnormality .  IMPRESSION: 1. Interim development of congestive heart failure and pulmonary edema with bilateral pleural effusions. Bibasilar pneumonia cannot be excluded. 2. Dense left lower lobe atelectasis.   Electronically Signed   By:  Marcello Moores  Register   On: 07/01/2014 07:40   Dg Chest Port 1 View  06/23/2014   CLINICAL DATA:  Pericardial effusion  EXAM: PORTABLE CHEST - 1 VIEW  COMPARISON:  06/21/2014  FINDINGS: Central venous catheter tip has been removed. Pericardial drainage tube unchanged in position on the left. The heart remains enlarged.  Small bilateral effusions unchanged. Right lower lobe airspace disease slightly increased. Improved aeration in the left lower lobe.  IMPRESSION: Pericardial drainage tube unchanged in position. Cardiac silhouette remains enlarged.  Improved aeration in the left lower lobe. Slight increase in right lower lobe airspace disease. Bilateral effusions unchanged.   Electronically Signed   By: Franchot Gallo M.D.   On: 06/23/2014 07:31   Dg Chest Port 1 View  06/21/2014   CLINICAL DATA:  Pericardial effusion .  EXAM: PORTABLE CHEST - 1 VIEW  COMPARISON:  06/20/2014.  FINDINGS: Right IJ line and left chest tube in stable position. Mediastinum is unremarkable. Stable cardiomegaly. Persistent bibasilar atelectasis and right pleural effusion noted. Small left pleural effusion cannot be excluded. No pneumothorax.  IMPRESSION: 1. Right IJ line and left chest tube in stable position. 2. Persistent bibasilar atelectasis and small right pleural effusion. Small left pleural effusion on today's exam cannot be excluded. 3. Stable cardiomegaly .   Electronically Signed   By: Marcello Moores  Register   On: 06/21/2014 07:30   Dg Chest Port 1 View  06/20/2014   CLINICAL DATA:  Pulmonary edema, tachycardia  EXAM: PORTABLE CHEST - 1 VIEW  COMPARISON:  06/19/2014  FINDINGS: Cardiac shadow remains enlarged. The right jugular central line is again seen and stable. A left-sided chest tube is noted and stable. No recurrent pneumothorax is seen. Aortic calcifications are again seen. Small right-sided pleural effusion remains. Slight improved aeration is noted in the left lung base.  IMPRESSION: Improved aeration in the bases with small  right pleural effusion still identified.  Tubes and lines as described.   Electronically Signed   By: Inez Catalina M.D.   On: 06/20/2014 07:51   Dg Chest Port 1 View  06/19/2014   CLINICAL DATA:  Acute respiratory failure. Shortness of breath. COPD.  EXAM: PORTABLE CHEST - 1 VIEW  COMPARISON:  06/16/2014  FINDINGS: Support apparatus: Right internal jugular line tip at SVC. Left chest tube unchanged in position.  Cardiomediastinal silhouette: Midline trachea. Moderate to marked enlargement of the cardiopericardial silhouette. Transverse aortic atherosclerosis.  Pleura: Small right pleural effusion is slightly decreased. A small left pleural effusion is similar. No pneumothorax.  Lungs: Mild interstitial edema. Improvement in right base airspace disease. Slight worsening left base airspace disease.  Other: None  IMPRESSION: Improvement in mild interstitial edema.  Similar small bilateral pleural effusions.  Shifting bibasilar airspace disease which could represent atelectasis or infection.   Electronically Signed   By: Abigail Miyamoto M.D.   On: 06/19/2014 08:18   Dg Chest Port 1  View  06/16/2014   CLINICAL DATA:  Pulmonary edema.  EXAM: PORTABLE CHEST - 1 VIEW  COMPARISON:  06/14/2014.  FINDINGS: Right IJ line and left chest tube in stable position. Cardiomegaly. Pulmonary vascularity normal. Persistent bibasilar pulmonary infiltrates and bilateral pleural effusions. No pneumothorax. No acute osseous abnormality.  IMPRESSION: 1. Lines and tubes in stable position. 2. Persistent bibasilar pulmonary infiltrates and bilateral pleural effusions. Bibasilar pulmonary infiltrates could be related to pneumonia and/or pulmonary edema. 3. Persistent severe cardiomegaly.   Electronically Signed   By: Marcello Moores  Register   On: 06/16/2014 07:33   Dg Chest Port 1 View  06/14/2014   CLINICAL DATA:  History of asthma, COPD, diabetes and hypertension. Chest tube placement.  EXAM: PORTABLE CHEST - 1 VIEW  COMPARISON:  06/13/2014;  06/11/2014; 06/09/2014  FINDINGS: Grossly unchanged enlarged cardiac silhouette and mediastinal contours. Stable position of support apparatus. No pneumothorax. Worsening consolidative opacities within the right middle lobe. Unchanged small bilateral effusions and associated bibasilar opacities. Pulmonary venous congestion without frank evidence of edema. Unchanged bones.  IMPRESSION: 1.  Stable positioning of support apparatus.  No pneumothorax. 2. Worsening right middle lobe consolidative opacities favored to represent a combination of airspace disease and atelectasis/collapse. 3. Otherwise, unchanged small effusions and associated bibasilar opacities, atelectasis versus infiltrate. 4. Pulmonary venous congestion without frank evidence of edema   Electronically Signed   By: Sandi Mariscal M.D.   On: 06/14/2014 10:02   Dg Chest Port 1 View  06/13/2014   CLINICAL DATA:  Hypoxia  EXAM: PORTABLE CHEST - 1 VIEW  COMPARISON:  June 12, 2014  FINDINGS: Central catheter tip is in the superior vena cava near the cavoatrial junction. There are bilateral effusions with bibasilar consolidation. There is edema with cardiomegaly and pulmonary venous hypertension. No pneumothorax. No adenopathy. No bone lesions.  IMPRESSION: Evidence of congestive heart failure. Question superimposed pneumonia in the bases. Central catheter tip in superior vena cava near the cavoatrial junction. No pneumothorax.   Electronically Signed   By: Lowella Grip M.D.   On: 06/13/2014 19:14   Dg Chest Port 1 View  06/12/2014   CLINICAL DATA:  Pericardial effusion  EXAM: PORTABLE CHEST - 1 VIEW  COMPARISON:  06/11/2014  FINDINGS: Cardiomegaly again noted. Stable right IJ central line position. No pulmonary edema. Persistent left basilar atelectasis or infiltrate. There is small right pleural effusion with right basilar atelectasis or infiltrate. No pneumothorax.  IMPRESSION: Small right pleural effusion with right basilar atelectasis or  infiltrate. Stable left basilar atelectasis or infiltrate. No pulmonary edema. Cardiomegaly again noted.   Electronically Signed   By: Lahoma Crocker M.D.   On: 06/12/2014 11:01   Dg Chest Port 1 View  06/11/2014   CLINICAL DATA:  Chest tube placement  EXAM: PORTABLE CHEST - 1 VIEW  COMPARISON:  Yesterday  FINDINGS: Central venous catheter stable. Cardiomegaly stable. Right basilar consolidation worse. Increasing patchy density at the left base. Left chest tube in place. No pneumothorax.  IMPRESSION: Increasing consolidation at the right lung base. Stable left chest tube without pneumothorax.   Electronically Signed   By: Maryclare Bean M.D.   On: 06/11/2014 08:44   Dg Chest Port 1 View  06/10/2014   CLINICAL DATA:  Status post pericardial window  EXAM: PORTABLE CHEST - 1 VIEW  COMPARISON:  06/09/2014  FINDINGS: The endotracheal tube and nasogastric catheter been removed in the interval. A right jugular line remains. Drainage catheter remains over the left lung base/ pericardium. The cardiac shadow  remains enlarged. Vascular congestion remains. Right basilar atelectasis is again noted.  IMPRESSION: Interval removal of nasogastric catheter and endotracheal tube. No new focal abnormality is seen.   Electronically Signed   By: Inez Catalina M.D.   On: 06/10/2014 07:54   Portable Chest Xray  06/09/2014   CLINICAL DATA:  Acute respiratory failure with hypoxia  EXAM: PORTABLE CHEST - 1 VIEW  COMPARISON:  06/08/2014  FINDINGS: Endotracheal tube in good position. Right jugular catheter tip in the SVC. NG tube in place with the tip not visualized. No pneumothorax  Allowing for lighter technique on the current study, no significant change from yesterday. Pulmonary vascular congestion. Bibasilar atelectasis unchanged.  IMPRESSION: Allowing for later technique, no significant change. Vascular congestion and bibasilar atelectasis.  Support lines remain in good position.   Electronically Signed   By: Franchot Gallo M.D.   On:  06/09/2014 07:21   Dg Chest Port 1 View  06/08/2014   CLINICAL DATA:  Pericardial effusion.  EXAM: PORTABLE CHEST - 1 VIEW  COMPARISON:  06/07/2014, 05/23/2014, 05/15/2014, 05/11/2014.  FINDINGS: Endotracheal tube and right IJ line in good anatomic position. Interim removal of NG tube. Cardiomegaly slight prominence of pulmonary vascularity. Previously identified pulmonary interstitial edema has partially clear. Bibasilar infiltrates with small pleural effusions again noted. These changes could be related to pulmonary edema and/or basilar pneumonia.  IMPRESSION: 1. Endotracheal tube and right IJ line in stable position. Interim removal of NG tube. 2. Congestive heart failure with improving pulmonary interstitial edema. Persistent bibasilar alveolar infiltrates and small pleural effusions. These changes could be related congestive heart failure and/or pneumonia.   Electronically Signed   By: Marcello Moores  Register   On: 06/08/2014 16:37    Medications: Scheduled Meds: . antiseptic oral rinse  7 mL Mouth Rinse q12n4p  . arformoterol  15 mcg Nebulization BID  . budesonide  0.25 mg Nebulization BID  . chlorhexidine  15 mL Mouth Rinse BID  . heparin  5,000 Units Subcutaneous 3 times per day  . insulin aspart  0-9 Units Subcutaneous 6 times per day  . methylPREDNISolone (SOLU-MEDROL) injection  60 mg Intravenous Q12H  . sodium chloride  3 mL Intravenous Q12H  . sodium chloride  3 mL Intravenous Q12H  . tiotropium  18 mcg Inhalation Daily      LOS: 1 day   Genavie Boettger M.D. [facility] 07/01/2014, 11:11 AM Pager: [phone]  If 7PM-7AM, please contact night-coverage [email or web address] Password [email or web address]

## 2014-07-01 NOTE — Care Management Note (Addendum)
    Page 1 of 2   07/04/2014     10:56:24 AM CARE MANAGEMENT NOTE 07/04/2014  Patient:  [name], [name]   Account Number:  [id number]  Date Initiated:  07/01/2014  Documentation initiated by:  Elissa Hefty  Subjective/Objective Assessment:   resp failure-bipap     Action/Plan:   from [facility]   Anticipated DC Date:     Anticipated DC Plan:  [place] referral  Clinical Social Worker      DC Forensic scientist  CM consult      [facility] Choice  HOSPICE   Choice offered to / List presented to:          [facility] arranged  HH-1 RN  [place]      [place] agency  [facility]   Status of service:   Medicare Important Message given?  YES (If response is "NO", the following Medicare IM given date fields will be blank) Date Medicare IM given:  07/04/2014 Medicare IM given by:  Elissa Hefty Date Additional Medicare IM given:   Additional Medicare IM given by:    Discharge Disposition:  [facility]  Per UR Regulation:  Reviewed for med. necessity/level of care/duration of stay  If discussed at [place] of Stay Meetings, dates discussed:   07/05/2014    Comments:  1/18  1051a debbie Ashle Stief rn,bsn spoke w pt. she plans to go home w hospice and wants [facility] [place]. ref to [facility] who will come  over today and speak w pt.  1/15 0952 debbie Arwen Haseley rn,bsn [facility] rep came by. they had been set up for home when [facility] leaves [facility]. will cont to follow.

## 2014-07-01 NOTE — Progress Notes (Signed)
Patient level of consciousness improving.  Patient responds to voice and able to state her name, where she is and express her needs (patient advised RN she needed to cough up sputum). Patient also able to follow commands. Paged NP to inform. Vicie Mutters, RN

## 2014-07-01 NOTE — Progress Notes (Signed)
Echocardiogram 2D Echocardiogram has been performed.  [name] 07/01/2014, 12:35 PM

## 2014-07-01 NOTE — Progress Notes (Signed)
RN paged because pt's ABG had worsened even on Bipap. She is more acidotic and PCO2 is rising. Also, in the past 2-3 hours her mental status has worsened and she is only responsive to pain and is non verbal. Pt is a DNR/DNI.  This NP spoke to Richardson, pt's son, by telephone. This NP explained to him the respiratory situation and decline even with Bipap. Explained that pt is likely not to survive this hospitalization. Did not broach comfort care at this time but should be considered in am if she survives til morning. He understands and reiterates that his mother is a DNR/DNI per her wishes over the past few months during her health decline.  Clance Boll, NP [facility]

## 2014-07-01 NOTE — Progress Notes (Signed)
ABG was entered as arterial, however, was venous blood. MD notified and a repeat wasn't necessary at this time per MD. RN aware.

## 2014-07-02 ENCOUNTER — Other Ambulatory Visit: Payer: Self-pay

## 2014-07-02 DIAGNOSIS — I313 Pericardial effusion (noninflammatory): Secondary | ICD-10-CM

## 2014-07-02 DIAGNOSIS — R0600 Dyspnea, unspecified: Secondary | ICD-10-CM

## 2014-07-02 DIAGNOSIS — I4892 Unspecified atrial flutter: Secondary | ICD-10-CM

## 2014-07-02 DIAGNOSIS — R7989 Other specified abnormal findings of blood chemistry: Secondary | ICD-10-CM

## 2014-07-02 DIAGNOSIS — I4891 Unspecified atrial fibrillation: Secondary | ICD-10-CM

## 2014-07-02 DIAGNOSIS — Z515 Encounter for palliative care: Secondary | ICD-10-CM

## 2014-07-02 LAB — CBC
HEMATOCRIT: 25.1 % — AB (ref 36.0–46.0)
Hemoglobin: 7.8 g/dL — ABNORMAL LOW (ref 12.0–15.0)
MCH: 27.2 pg (ref 26.0–34.0)
MCHC: 31.1 g/dL (ref 30.0–36.0)
MCV: 87.5 fL (ref 78.0–100.0)
Platelets: 319 10*3/uL (ref 150–400)
RBC: 2.87 MIL/uL — ABNORMAL LOW (ref 3.87–5.11)
RDW: 16.1 % — AB (ref 11.5–15.5)
WBC: 6.6 10*3/uL (ref 4.0–10.5)

## 2014-07-02 LAB — HEMOGLOBIN A1C
HEMOGLOBIN A1C: 5.9 % — AB (ref <5.7)
Mean Plasma Glucose: 123 mg/dL — ABNORMAL HIGH (ref <117)

## 2014-07-02 LAB — BASIC METABOLIC PANEL
Anion gap: 6 (ref 5–15)
BUN: 27 mg/dL — AB (ref 6–23)
CALCIUM: 6.5 mg/dL — AB (ref 8.4–10.5)
CHLORIDE: 112 meq/L (ref 96–112)
CO2: 23 mmol/L (ref 19–32)
CREATININE: 1.43 mg/dL — AB (ref 0.50–1.10)
GFR calc Af Amer: 43 mL/min — ABNORMAL LOW (ref >90)
GFR calc non Af Amer: 37 mL/min — ABNORMAL LOW (ref >90)
GLUCOSE: 191 mg/dL — AB (ref 70–99)
POTASSIUM: 3.3 mmol/L — AB (ref 3.5–5.1)
Sodium: 141 mmol/L (ref 135–145)

## 2014-07-02 LAB — GLUCOSE, CAPILLARY
GLUCOSE-CAPILLARY: 112 mg/dL — AB (ref 70–99)
Glucose-Capillary: 102 mg/dL — ABNORMAL HIGH (ref 70–99)
Glucose-Capillary: 122 mg/dL — ABNORMAL HIGH (ref 70–99)
Glucose-Capillary: 138 mg/dL — ABNORMAL HIGH (ref 70–99)
Glucose-Capillary: 158 mg/dL — ABNORMAL HIGH (ref 70–99)
Glucose-Capillary: 94 mg/dL (ref 70–99)

## 2014-07-02 LAB — TROPONIN I
Troponin I: 0.03 ng/mL (ref <0.031)
Troponin I: 0.05 ng/mL — ABNORMAL HIGH (ref <0.031)

## 2014-07-02 MED ORDER — HYDRALAZINE HCL 25 MG PO TABS
25.0000 mg | ORAL_TABLET | Freq: Three times a day (TID) | ORAL | Status: DC
  Administered 2014-07-02 – 2014-07-05 (×11): 25 mg via ORAL
  Filled 2014-07-02 (×14): qty 1

## 2014-07-02 MED ORDER — SENNOSIDES-DOCUSATE SODIUM 8.6-50 MG PO TABS
1.0000 | ORAL_TABLET | Freq: Every day | ORAL | Status: DC
  Administered 2014-07-03 – 2014-07-04 (×2): 1 via ORAL
  Filled 2014-07-02 (×3): qty 1

## 2014-07-02 MED ORDER — DILTIAZEM HCL 30 MG PO TABS
30.0000 mg | ORAL_TABLET | Freq: Three times a day (TID) | ORAL | Status: DC
  Administered 2014-07-02 – 2014-07-03 (×3): 30 mg via ORAL
  Filled 2014-07-02 (×6): qty 1

## 2014-07-02 MED ORDER — INSULIN ASPART 100 UNIT/ML ~~LOC~~ SOLN
0.0000 [IU] | Freq: Three times a day (TID) | SUBCUTANEOUS | Status: DC
  Administered 2014-07-02: 2 [IU] via SUBCUTANEOUS
  Administered 2014-07-03: 1 [IU] via SUBCUTANEOUS
  Administered 2014-07-03 – 2014-07-04 (×3): 2 [IU] via SUBCUTANEOUS
  Administered 2014-07-04: 1 [IU] via SUBCUTANEOUS

## 2014-07-02 MED ORDER — PANTOPRAZOLE SODIUM 40 MG PO TBEC
40.0000 mg | DELAYED_RELEASE_TABLET | Freq: Every day | ORAL | Status: DC
  Administered 2014-07-02 – 2014-07-05 (×4): 40 mg via ORAL
  Filled 2014-07-02 (×3): qty 1

## 2014-07-02 MED ORDER — PRAVASTATIN SODIUM 40 MG PO TABS
40.0000 mg | ORAL_TABLET | Freq: Every day | ORAL | Status: DC
  Administered 2014-07-02 – 2014-07-03 (×2): 40 mg via ORAL
  Filled 2014-07-02 (×3): qty 1

## 2014-07-02 MED ORDER — INSULIN ASPART 100 UNIT/ML ~~LOC~~ SOLN: 0.0000 [IU] | Freq: Every day | SUBCUTANEOUS | Status: DC

## 2014-07-02 MED ORDER — DILTIAZEM LOAD VIA INFUSION
10.0000 mg | Freq: Once | INTRAVENOUS | Status: AC
  Administered 2014-07-02: 10 mg via INTRAVENOUS
  Filled 2014-07-02: qty 10

## 2014-07-02 MED ORDER — DILTIAZEM HCL 100 MG IV SOLR
5.0000 mg/h | INTRAVENOUS | Status: AC
  Administered 2014-07-02: 5 mg/h via INTRAVENOUS
  Filled 2014-07-02: qty 100

## 2014-07-02 MED ORDER — PANTOPRAZOLE SODIUM 40 MG IV SOLR
40.0000 mg | INTRAVENOUS | Status: DC
  Administered 2014-07-02: 40 mg via INTRAVENOUS
  Filled 2014-07-02 (×2): qty 40

## 2014-07-02 MED ORDER — TRAMADOL HCL 50 MG PO TABS: 50.0000 mg | ORAL_TABLET | Freq: Four times a day (QID) | ORAL | Status: DC | PRN

## 2014-07-02 MED ORDER — SERTRALINE HCL 50 MG PO TABS
50.0000 mg | ORAL_TABLET | Freq: Every day | ORAL | Status: DC
  Administered 2014-07-02 – 2014-07-04 (×3): 50 mg via ORAL
  Filled 2014-07-02 (×4): qty 1

## 2014-07-02 MED ORDER — DILTIAZEM HCL 25 MG/5ML IV SOLN
10.0000 mg | Freq: Once | INTRAVENOUS | Status: DC
  Filled 2014-07-02: qty 5

## 2014-07-02 NOTE — Progress Notes (Signed)
Pt still got sutures in the Left chest,scab noted but no s/s of infection.

## 2014-07-02 NOTE — Consult Note (Signed)
Cardiology Consultation   Patient ID: [name]; [id number]; 10/04/1945   Admit date: 06/30/2014 Date of Consult: 07/02/2014  Primary Physician: Hollace Kinnier, DO Cardiologist: Dr. Liam Rogers    Reason for Consultation: Atrial flutter with RVR  History of Present Illness: [name] is a 69 y.o. 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   She had recurrent pericardial effusion noted during admission in 05/2014 and was seen by cardiothoracic surgery who performed a left video-assisted thoracoscopy and pericardial window.  She was discharged 06/24/14.  She was transferred back to the emergency room on 1/14 from her assisted-living facility for shortness of breath, increased work of breathing and tachycardia. She was found to be in hypercapnic respiratory failure/COPD exacerbation.[name] She was placed on BiPAP in the emergency room.  Echocardiogram demonstrated normal LV function and trivial pericardial effusion. The patient's ABGs worsened and she has become more acidotic and her PCO2 is rising.  She has been noted to have acute kidney injury with creatinine up to 1.8. Troponin has also been elevated (highest 0.08). She has been seen by palliative care again this admission. She was noted to go in probable atypical atrial flutter with RVR earlier today with heart rates in the 150s.  She returned to normal sinus rhythm with IV diltiazem.  She is on oral diltiazem now at 30 mg 3 times a day.   She denies chest pain. Her breathing is improved since admission to the hospital. She has noted some right greater than left edema recently. She denies syncope. However, she does note a recent history of unresponsiveness at the nursing  home. It sounds as though this is likely in the setting of respiratory failure. She sleeps on an incline. She denies PND.  CHADS2-VASc=3 (HTN, female, age > 6).    Past Medical History  Diagnosis Date  . COPD (chronic obstructive pulmonary disease)   . Hypertension   . Diabetes mellitus 09/13/2010    glucophage  . Gout   . Oxygen dependent     pt uses O2   . Pericardial effusion 08/2011    small residual on echo  . Hyperlipidemia   . Personal history of noncompliance with medical treatment, presenting hazards to health   . Unspecified disease of pericardium   . Hemorrhage of rectum and anus   . Panic disorder without agoraphobia   . Depressive disorder, not elsewhere classified   . Anxiety state, unspecified   . Unspecified hereditary and idiopathic peripheral neuropathy   . Obesity, unspecified   . Unspecified tinnitus   . Unspecified essential hypertension   . Pneumonia, organism unspecified   . Chronic obstructive asthma, unspecified   . Acute and chronic respiratory failure   . Urinary frequency   . Urgency of urination   . Anemia due to chronic blood loss 01/04/2013    Past Surgical History  Procedure Laterality Date  . Breast biopsy      both breasts  . Total abdominal hysterectomy    . Esophagogastroduodenoscopy N/A 01/03/2013    Procedure: ESOPHAGOGASTRODUODENOSCOPY (EGD);  Surgeon: Inda Castle, MD;  Location: [place];  Service: Endoscopy;  Laterality: N/A;  . Colonoscopy N/A 01/05/2013    Procedure: COLONOSCOPY;  Surgeon: Gatha Mayer, MD;  Location: [place];  Service: Endoscopy;  Laterality: N/A;  . Subxyphoid pericardial window N/A 05/11/2014    Procedure: SUBXYPHOID PERICARDIAL WINDOW;  Surgeon: Melrose Nakayama, MD;  Location: [facility] [facility];  Service: Thoracic;  Laterality: N/A;  . Tee without cardioversion N/A 05/11/2014    Procedure: TRANSESOPHAGEAL ECHOCARDIOGRAM (TEE);  Surgeon: Melrose Nakayama, MD;  Location: [place];  Service: Thoracic;   Laterality: N/A;  . Pericardial window N/A 06/08/2014    Procedure: PERICARDIAL WINDOW;  Surgeon: Melrose Nakayama, MD;  Location: [place];  Service: Thoracic;  Laterality: N/A;  . Video assisted thoracoscopy (vats)/thorocotomy Left 06/08/2014    Procedure: VIDEO ASSISTED THORACOSCOPY (VATS);  Surgeon: Melrose Nakayama, MD;  Location: [place];  Service: Thoracic;  Laterality: Left;      Home Meds: Prior to Admission medications   Medication Sig Start Date End Date Taking? Authorizing Provider  albuterol (PROVENTIL) (2.5 MG/3ML) 0.083% nebulizer solution Take 3 mLs (2.5 mg total) by nebulization every 3 (three) hours as needed for wheezing or shortness of breath. 05/30/14  Yes Marijean Heath, NP  ALPRAZolam Duanne Moron) 0.5 MG tablet 1 by mouth every 8 hours as needed for anxiety Patient taking differently: Take 0.5 mg by mouth every 8 (eight) hours as needed for anxiety.  06/27/14  Yes [name] L Reed, DO  arformoterol (BROVANA) 15 MCG/2ML NEBU Take 2 mLs (15 mcg total) by nebulization 2 (two) times daily. 05/30/14  Yes Marijean Heath, NP  budesonide (PULMICORT) 0.25 MG/2ML nebulizer solution Take 2 mLs (0.25 mg total) by nebulization 2 (two) times daily. 06/06/14  Yes Tammy S Parrett, NP  colchicine 0.6 MG tablet Take 1 tablet (0.6 mg total) by mouth 2 (two) times daily. Patient taking differently: Take 0.6 mg by mouth as needed (for gout flare up).  06/24/14  Yes Donita Brooks, NP  feeding supplement, GLUCERNA SHAKE, (GLUCERNA SHAKE) LIQD Take 237 mLs by mouth 3 (three) times daily between meals. 05/30/14  Yes Marijean Heath, NP  hydrALAZINE (APRESOLINE) 25 MG tablet Take 1 tablet (25 mg total) by mouth every 8 (eight) hours. 01/21/14  Yes [name] L Reed, DO  insulin aspart (NOVOLOG) 100 UNIT/ML injection SQ qhs   CBG 70 - 120: 0 units CBG 121 - 150: 0 units CBG 151 - 200: 0 units CBG 201 - 250: 2 units CBG 251 - 300: 3 units CBG 301 - 350: 4 units CBG 351 - 400: 5  units CBG > 400: call MD 05/30/14  Yes Marijean Heath, NP  insulin aspart (NOVOLOG) 100 UNIT/ML injection SQ TID with meals   CBG 70 - 120: 0 units CBG 121 - 150: 2 units CBG 151 - 200: 3 units CBG 201 - 250: 5 units CBG 251 - 300: 8 units CBG 301 - 350: 11 units CBG 351 - 400: 15 units CBG > 400: call MD 05/30/14  Yes Marijean Heath, NP  lovastatin (ALTOPREV) 40 MG 24 hr tablet Take 40 mg by mouth at bedtime.   Yes Historical Provider, MD  Multiple Vitamins-Minerals (MULTIVITAMIN ADULTS 50+) TABS Take 1 tablet by mouth daily.   Yes Historical Provider, MD  oxyCODONE (OXY IR/ROXICODONE) 5 MG immediate release tablet Take 1-2 tablets (5-10 mg total) by mouth every 4 (four) hours as needed for severe pain. 06/27/14  Yes [name] L Reed, DO  pantoprazole (PROTONIX) 40 MG tablet Take 1 tablet (40 mg total) by mouth daily. 02/16/14  Yes Jerene Bears, MD  senna-docusate (SENOKOT-S) 8.6-50 MG per tablet Take  1 tablet by mouth at bedtime. 06/24/14  Yes Donita Brooks, NP  sertraline (ZOLOFT) 50 MG tablet Take 1 tablet (50 mg total) by mouth at bedtime. 01/21/14  Yes [name] L Reed, DO  tiotropium (SPIRIVA) 18 MCG inhalation capsule Place 1 capsule (18 mcg total) into inhaler and inhale daily. 06/24/14  Yes Donita Brooks, NP  traMADol (ULTRAM) 50 MG tablet Take 1-2 tablets (50-100 mg total) by mouth every 6 (six) hours as needed (mild pain). 06/27/14  Yes [name] L Reed, DO  lovastatin (MEVACOR) 40 MG tablet Take 1 tablet (40 mg total) by mouth at bedtime. For hyperlipedemia Patient not taking: Reported on 06/30/2014 10/27/13   Gayland Curry, DO     Current Medications: . arformoterol  15 mcg Nebulization BID  . budesonide  0.25 mg Nebulization BID  . diltiazem  30 mg Oral 3 times per day  . heparin  5,000 Units Subcutaneous 3 times per day  . hydrALAZINE  25 mg Oral 3 times per day  . insulin aspart  0-5 Units Subcutaneous QHS  . insulin aspart  0-9 Units Subcutaneous TID WC  .  methylPREDNISolone (SOLU-MEDROL) injection  60 mg Intravenous Q12H  . pantoprazole  40 mg Oral Daily  . pravastatin  40 mg Oral QHS  . senna-docusate  1 tablet Oral QHS  . sertraline  50 mg Oral QHS  . sodium chloride  3 mL Intravenous Q12H  . sodium chloride  3 mL Intravenous Q12H  . tiotropium  18 mcg Inhalation Daily      Allergies:    Allergies  Allergen Reactions  . Lisinopril Swelling and Other (See Comments)    Angioedema   . Advair Diskus [Fluticasone-Salmeterol] Other (See Comments)    Nose bleeds.   . Levaquin [Levofloxacin] Hives and Other (See Comments)    White marks inside mouth  . Penicillins Hives     Social History:   The patient  reports that she quit smoking about 10 years ago. Her smoking use included Cigarettes. She has a 30 pack-year smoking history. She has never used smokeless tobacco. She reports that she drinks about 1.0 oz of alcohol per week. She reports that she does not use illicit drugs.     Family History:   The patient's family history includes Asthma in her father; COPD in her mother and sister; Cancer in her father; Diabetes in her other; Hypertension in her sister; Prostate cancer in her paternal uncle.    ROS:  Please see the history of present illness. She does note a recent history of black stools. She denies bright red blood per rectum or hematuria. She denies rashes.  All other systems reviewed and negative.    Vital Signs: Blood pressure 141/72, pulse 87, temperature 97.9 F (36.6 C), temperature source Oral, resp. rate 24, height 5\' 2"  (1.575 m), weight 162 lb (73.483 kg), SpO2 98 %.   PHYSICAL EXAM: General:  Well nourished, well developed, in no acute distress HEENT: normal Lymph: no adenopathy Neck: no JVD Endocrine:  No thryomegaly Cardiac:  normal S1, S2; RRR; no murmur Lungs:  decreased breath sounds bilaterally, no wheezing  Abd: soft, nontender, no hepatomegaly Ext: no edema Musculoskeletal:  No deformities  Skin: warm  and dry Neuro:  CNs 2-12 intact, no focal abnormalities noted Psych:  Normal affect    EKG:   probable atypical atrial flutter with variable AV block, HR 158, diffuse ST abnormality inferolaterally   Recent Labs  06/30/14 1736 06/30/14 2258  07/01/14 0319 07/02/14 1110  TROPONINI 0.08* 0.07* 0.06* 0.03    Lab Results  Component Value Date   WBC 6.6 07/02/2014   HGB 7.8* 07/02/2014   HCT 25.1* 07/02/2014   MCV 87.5 07/02/2014   PLT 319 07/02/2014     Recent Labs Lab 07/01/14 0319 07/02/14 1110  NA 140 141  K 4.8 3.3*  CL 97 112  CO2 37* 23  BUN 20 27*  CREATININE 1.83* 1.43*  CALCIUM 9.1 6.5*  PROT 6.0  --   BILITOT 0.5  --   ALKPHOS 78  --   ALT 40*  --   AST 23  --   GLUCOSE 123* 191*     Radiology/Studies:  Dg Chest 2 View  06/30/2014   CLINICAL DATA:  Shortness of breath with productive cough for 2 weeks. History of COPD.  EXAM: CHEST  2 VIEW  COMPARISON:  06/24/2014  FINDINGS: The cardiac silhouette remains enlarged, not significantly changed. There are small bilateral pleural effusions, slightly increased from prior. Bibasilar lung opacities have mildly increased from prior, particularly in the left lower lobe. No pneumothorax is identified. No acute osseous abnormality is seen.  IMPRESSION: 1. Small bilateral pleural effusions, slightly increased from prior. 2. Increased bibasilar opacities, which may reflect atelectasis versus infection.   Electronically Signed   By: Logan Bores   On: 06/30/2014 14:49    Echocardiogram 07/01/14: Moderate LVH, EF 55-60%, normal wall motion, grade 1 diastolic dysfunction, trivial AI, mild BAE, trivial effusion    ASSESSMENT AND PLAN:  1. Atrial flutter with RVR: The patient is back in normal sinus rhythm after 1 dose of IV diltiazem. She is now on diltiazem 30 mg every 8 hours. Her blood pressure and heart rate will likely be able to tolerate diltiazem 120 mg daily. We can certainly consider adjusting to this dose in the  morning. She has a history of AVMs with significant GI bleeding in the past. She does have a significantly elevated thromboembolic risk factor profile. Ordinarily, she would be an ideal candidate for anticoagulation. However, she will likely not be a good candidate given her previous GI bleeding. I would continue no more than DVT dose heparin at this point in time. 2. Elevated troponin: This is likely secondary to demand ischemia in the setting of respiratory failure and atrial flutter with RVR. Wall motion is normal on recent echocardiogram. Cardiac enzymes will continue to be cycled. She is a DO NOT RESUSCITATE. I would anticipate no further cardiac workup at this time. 3. End-stage COPD with recent exacerbation: Per attending service. 4. Recurrent pericardial effusion status post pericardial window: Recent echocardiogram with trivial effusion  .  5. Acute kidney injury: Creatinine is improved.  6. Hypertension: Blood pressure somewhat elevated. As noted, her blood pressure will likely tolerate a higher dose of diltiazem.  7. Anemia:  Her hemoglobin is lower.  She does note a history of black stools. She has a history of GI bleed from duodenal AVMs. Obtain Hemoccults. 8. DO NOT RESUSCITATE/DO NOT INTUBATE: She has been evaluated by palliative care.  Disposition: Patient will be seen by attending cardiologist later today.   Danton Sewer, PA-C  07/02/2014 5:37 PM  Pager # [phone]   Patient seen and discussed with PA Kathlen Mody, agree with documentation above. Back in NSR, continue oral dilit. History of AVMs and prior GI bleeds, will hold on anticoag at this time. Trop slightly above detection level now trending down, peak 0.08. Likely demand ischemia given aflutter  with RVR and COPD exacerbation. Repeat echo with stable LVEF and no WMAs, no plans for ischemic testing at this time. History of pericardial effusions s/p prior interventions, repeat echo shows no significant recurrence.   Zandra Abts MD

## 2014-07-02 NOTE — Progress Notes (Signed)
Patient off BIPAP and placed on 4 lpm nasal cannula.  Patient stable and tolerating well at this time.  RN at bedside.

## 2014-07-02 NOTE — Consult Note (Signed)
Patient QM:[name]      DOB: Sep 06, 1945      BMW:[id number]     Consult Note from the Palliative Medicine Team at [facility] Requested by: Dr Tana Coast     PCP: Hollace Kinnier, DO Reason for Consultation: Goals of care    Phone [phone]  Assessment/Recommendations: 69 yo female with ES-COPD, recurrent pericardial effusion who presented with dyspnea 2/2 hypercapnic resp failure. Palliative consulted for goals of care  1.  Code Status: DNR  2. Goals of Care: [name] is known to me from 2 hospitalizations ago. At that time, she was considering going home with hospice care but ultimately trialed SNF. She had repeat hospital stay shortly after with recurrent pericardial effusion s/p VATS and now has not been home since November. Another challenge is that she lives alone because her children both live in [place].  She is aware that she has ES-COPD and that she ultimately keeps having setbacks and declining.  She feels like she bounced back pretty well from VATS procedure and is not sure if she wants to go back to SNF.  We again talked about hospice care and how that could bring her support at home.  We talked in general about what things hospice in most cases can and can't do.  Idea would be to try to manage her condition at home and avoid repeat hospitalizations (though she is certainly allowed to come back to hospital under hospice care).  She asked about how they would handle her comfort when coming to end of life and we discussed this as well.  At this time [name] is unsure about how she will proceed after this acute stay (SNF vs home with hospice care).  Will give her time to process more of this information. My concern with SNF is that she may get caught in cycle of SNF followed by acute hospitalization leading to little quality time at home.  It feels a little like she is already caught in this cycle.   3. Symptom Management:   Dyspnea- COPD treatment with  steroids. Agree with low dose dilaudid PRN (would avoid morphine with AKI). Also on low dose PRN benzo.  Improving  4. Psychosocial/Spiritual: son and daughter live in [place]. She is active at retirement community and enjoys playing cards.  Living at home independently prior to recent hospitalizations and return to SNF.     Brief HPI: 69 yo female with ES-COPD, recurrent pericardial effusion, DM who is known to me from prior hospitalization.  She was discharged to SNF with intention of going home with hospice care after this.  Unfortunately was admitted shortly after this with recurrent pericardial effusion and underwent VATS procedure.  Returned to SNF on 1/8 and then re-admitted here on 1/14 with dyspnea 2/2 hypercapnic resp failure and AKI.  She was requiring BiPAP on admission, but off this morning. States that her breathing is feeling better but still having some struggles, especially with conversation.  Feels like she was bouncing back well from VATS and close to previous baseline prior to procedure. Frustrated with not being able to go home since November. Eating breakfast well this morning without any N/V.      PMH:  Past Medical History  Diagnosis Date  . COPD (chronic obstructive pulmonary disease)   . Hypertension   . Diabetes mellitus 09/13/2010    glucophage  . Gout   . Oxygen dependent     pt uses O2   . Pericardial  effusion 08/2011    small residual on echo  . Hyperlipidemia   . Personal history of noncompliance with medical treatment, presenting hazards to health   . Unspecified disease of pericardium   . Hemorrhage of rectum and anus   . Panic disorder without agoraphobia   . Depressive disorder, not elsewhere classified   . Anxiety state, unspecified   . Unspecified hereditary and idiopathic peripheral neuropathy   . Obesity, unspecified   . Unspecified tinnitus   . Unspecified essential hypertension   . Pneumonia, organism unspecified   . Chronic obstructive  asthma, unspecified   . Acute and chronic respiratory failure   . Urinary frequency   . Urgency of urination   . Anemia due to chronic blood loss 01/04/2013     PSH: Past Surgical History  Procedure Laterality Date  . Breast biopsy      both breasts  . Total abdominal hysterectomy    . Esophagogastroduodenoscopy N/A 01/03/2013    Procedure: ESOPHAGOGASTRODUODENOSCOPY (EGD);  Surgeon: Inda Castle, MD;  Location: [facility];  Service: Endoscopy;  Laterality: N/A;  . Colonoscopy N/A 01/05/2013    Procedure: COLONOSCOPY;  Surgeon: Gatha Mayer, MD;  Location: [facility];  Service: Endoscopy;  Laterality: N/A;  . Subxyphoid pericardial window N/A 05/11/2014    Procedure: SUBXYPHOID PERICARDIAL WINDOW;  Surgeon: Melrose Nakayama, MD;  Location: [facility];  Service: Thoracic;  Laterality: N/A;  . Tee without cardioversion N/A 05/11/2014    Procedure: TRANSESOPHAGEAL ECHOCARDIOGRAM (TEE);  Surgeon: Melrose Nakayama, MD;  Location: [facility];  Service: Thoracic;  Laterality: N/A;  . Pericardial window N/A 06/08/2014    Procedure: PERICARDIAL WINDOW;  Surgeon: Melrose Nakayama, MD;  Location: [place] [facility];  Service: Thoracic;  Laterality: N/A;  . Video assisted thoracoscopy (vats)/thorocotomy Left 06/08/2014    Procedure: VIDEO ASSISTED THORACOSCOPY (VATS);  Surgeon: Melrose Nakayama, MD;  Location: [facility];  Service: Thoracic;  Laterality: Left;   I have reviewed the [place] and [facility] and  If appropriate update it with new information. Allergies  Allergen Reactions  . Lisinopril Swelling and Other (See Comments)    Angioedema   . Advair Diskus [Fluticasone-Salmeterol] Other (See Comments)    Nose bleeds.   . Levaquin [Levofloxacin] Hives and Other (See Comments)    White marks inside mouth  . Penicillins Hives   Scheduled Meds: . antiseptic oral rinse  7 mL Mouth Rinse q12n4p  . arformoterol  15 mcg Nebulization BID  . budesonide  0.25 mg Nebulization BID  . chlorhexidine  15 mL Mouth  Rinse BID  . heparin  5,000 Units Subcutaneous 3 times per day  . hydrALAZINE  25 mg Oral 3 times per day  . insulin aspart  0-5 Units Subcutaneous QHS  . insulin aspart  0-9 Units Subcutaneous TID WC  . methylPREDNISolone (SOLU-MEDROL) injection  60 mg Intravenous Q12H  . pantoprazole  40 mg Oral Daily  . pravastatin  40 mg Oral QHS  . senna-docusate  1 tablet Oral QHS  . sertraline  50 mg Oral QHS  . sodium chloride  3 mL Intravenous Q12H  . sodium chloride  3 mL Intravenous Q12H  . tiotropium  18 mcg Inhalation Daily   Continuous Infusions: . sodium chloride     PRN Meds:.sodium chloride, acetaminophen **OR** acetaminophen, ALPRAZolam, HYDROmorphone (DILAUDID) injection, levalbuterol, levalbuterol, sodium chloride, traMADol    BP 162/115 mmHg  Pulse 89  Temp(Src) 98.1 F (36.7 C) (Oral)  Resp 21  Ht 5\' 2"  (  1.575 m)  Wt 73.483 kg (162 lb)  BMI 29.62 kg/m2  SpO2 95%   PPS: 50   Intake/Output Summary (Last 24 hours) at 07/02/14 0924 Last data filed at 07/02/14 0700  Gross per 24 hour  Intake   1455 ml  Output    250 ml  Net   1205 ml    Physical Exam:  General: Alert, NAD HEENT: [place], sclera anicteric, mmm Neck: supple Chest:   Conversational dyspnea with pursed lip breathing Skin: warm/dry  Labs: CBC    Component Value Date/Time   WBC 6.1 07/01/2014 0319   WBC 4.9 01/21/2014 1127   RBC 3.34* 07/01/2014 0319   RBC 4.07 01/21/2014 1127   RBC 3.75* 11/07/2012 0340   HGB 9.1* 07/01/2014 0319   HCT 30.8* 07/01/2014 0319   PLT 353 07/01/2014 0319   MCV 92.2 07/01/2014 0319   MCH 27.2 07/01/2014 0319   MCH 27.8 01/21/2014 1127   MCHC 29.5* 07/01/2014 0319   MCHC 31.7 01/21/2014 1127   RDW 16.7* 07/01/2014 0319   RDW 14.9 01/21/2014 1127   LYMPHSABS 1.3 06/30/2014 1502   LYMPHSABS 1.5 01/21/2014 1127   MONOABS 0.7 06/30/2014 1502   EOSABS 0.0 06/30/2014 1502   EOSABS 0.2 01/21/2014 1127   BASOSABS 0.0 06/30/2014 1502   BASOSABS 0.0 01/21/2014 1127     BMET    Component Value Date/Time   NA 140 07/01/2014 0319   NA 144 01/21/2014 1127   K 4.8 07/01/2014 0319   CL 97 07/01/2014 0319   CO2 37* 07/01/2014 0319   GLUCOSE 123* 07/01/2014 0319   GLUCOSE 109* 01/21/2014 1127   BUN 20 07/01/2014 0319   BUN 19 01/21/2014 1127   CREATININE 1.83* 07/01/2014 0319   CALCIUM 9.1 07/01/2014 0319   GFRNONAA 27* 07/01/2014 0319   GFRAA 32* 07/01/2014 0319    CMP     Component Value Date/Time   NA 140 07/01/2014 0319   NA 144 01/21/2014 1127   K 4.8 07/01/2014 0319   CL 97 07/01/2014 0319   CO2 37* 07/01/2014 0319   GLUCOSE 123* 07/01/2014 0319   GLUCOSE 109* 01/21/2014 1127   BUN 20 07/01/2014 0319   BUN 19 01/21/2014 1127   CREATININE 1.83* 07/01/2014 0319   CALCIUM 9.1 07/01/2014 0319   PROT 6.0 07/01/2014 0319   PROT 6.7 01/21/2014 1127   ALBUMIN 2.4* 07/01/2014 0319   AST 23 07/01/2014 0319   ALT 40* 07/01/2014 0319   ALKPHOS 78 07/01/2014 0319   BILITOT 0.5 07/01/2014 0319   GFRNONAA 27* 07/01/2014 0319   GFRAA 32* 07/01/2014 0319    1/14 CXR IMPRESSION: 1. Small bilateral pleural effusions, slightly increased from prior. 2. Increased bibasilar opacities, which may reflect atelectasis versus infection.   Total Time: 60 minutes Greater than 50%  of this time was spent counseling and coordinating care related to the above assessment and plan.   Doran Clay D.O. Palliative Medicine Team at [facility]  Pager: [phone] Team Phone: [phone]

## 2014-07-02 NOTE — Progress Notes (Signed)
Patient ID: [name]  female  WCB:[id number]    DOB: 20-Dec-1945    DOA: 06/30/2014  PCP: Hollace Kinnier, DO   Brief history of present illness  Patient is a 69 year old female, SNF resident with history hyperlipidemia, diabetes, anxiety, chronic respiratory failure on 3-4 L O2, severe COPD, 2 recent admissions for pericardial effusion status post pericardial window 05/11/14 and 12/23. Patient was discharged after a prolonged hospitalization, return back to the ED for shortness of breath, respiratory distress and tachycardia. Patient was placed on BiPAP in ED, was found to have hypercapnic respiratory failure. Echo in ED showed normal contractility without tamponade physiology. Patient was admitted to stepdown on BiPAP.  Assessment/Plan: Principal Problem: Atrial flutter with RVR - Prior history of paroxysmal A. fib, placed on IV Cardizem drip for rate control, cardiology consulted - Troponins were borderline positive at the time of admission, will recheck a set of cardiac enzymes, anticoagulation deferred to cardiology. -2-D echo showed EF of 61-60%, grade 1 diastolic dysfunction, trivial pericardial effusion, compared to 06/07/14 pericardial effusion is smaller.    Active problems   Respiratory failure, acute on chronic with hypoxia, respiratory distress secondary to COPD with acute exacerbation -Currently off BiPAP,  - Continue nebulizer, Brovana, Spiriva, Pulmicort    Pericardial effusion - bedside echo in the ER showed no cardiac tamponade - 2-D echo showed EF of 73-71%, grade 1 diastolic dysfunction, trivial pericardial effusion, compared to 06/07/14 pericardial effusion is smaller.    Acute kidney injury - creatinine worsened to 1.8, baseline 1.6, Was placed on gentle hydration, recheck BMET today.     Essential hypertension, benign BP currently stable    Diabetes mellitus type II, controlled, with no complications - Continue sliding scale insulin   DVT  Prophylaxis: heparin subcutaneous  Code Status:DO NOT RESUSCITATE   Family Communication:  Disposition: monitor in stepdown today.    Consultants:  None   Procedures:  BiPAP   Antibiotics:  None    Subjective: At the time of my examination earlier this morning, patient was doing well, no chest pain, shortness of breath, back to her baseline. Plan was to transition her to the floor today however patient subsequently went into rapid atrial flutter, heart rate in the 150s.   Objective: Weight change:   Intake/Output Summary (Last 24 hours) at 07/02/14 1122 Last data filed at 07/02/14 1030  Gross per 24 hour  Intake   1695 ml  Output    252 ml  Net   1443 ml   Blood pressure 162/115, pulse 89, temperature 98.1 F (36.7 C), temperature source Oral, resp. rate 21, height 5\' 2"  (1.575 m), weight 73.483 kg (162 lb), SpO2 95 %.  Physical Exam: General: Alert and awake,oriented  GGY:IRSWNIOEVOJ irregular  ChestDecreased breath sound throughout Abdomen : soft nontender, nondistended, normal bowel sounds  Extremities: no cyanosis, clubbing or edema noted bilaterally   Lab Results: Basic Metabolic Panel:  Recent Labs Lab 06/30/14 1502 06/30/14 1736 07/01/14 0319  NA 140  --  140  K 4.3  --  4.8  CL 98  --  97  CO2 34*  --  37*  GLUCOSE 121*  --  123*  BUN 18  --  20  CREATININE 1.52* 1.60* 1.83*  CALCIUM 8.9  --  9.1   Liver Function Tests:  Recent Labs Lab 07/01/14 0319  AST 23  ALT 40*  ALKPHOS 78  BILITOT 0.5  PROT 6.0  ALBUMIN 2.4*   No results for input(s): LIPASE,  AMYLASE in the last 168 hours. No results for input(s): AMMONIA in the last 168 hours. CBC:  Recent Labs Lab 06/30/14 1502 07/01/14 0319  WBC 7.5 6.1  NEUTROABS 5.5  --   HGB 9.2* 9.1*  HCT 30.7* 30.8*  MCV 90.3 92.2  PLT 361 353   Cardiac Enzymes:  Recent Labs Lab 06/30/14 1736 06/30/14 2258 07/01/14 0319  TROPONINI 0.08* 0.07* 0.06*   BNP: Invalid input(s):  POCBNP CBG:  Recent Labs Lab 07/01/14 1536 07/01/14 2037 07/01/14 2346 07/02/14 0340 07/02/14 0829  GLUCAP 101* 121* 122* 102* 94     Micro Results: Recent Results (from the past 240 hour(s))  Clostridium Difficile by PCR     Status: None   Collection Time: 06/23/14  4:24 AM  Result Value Ref Range Status   C difficile by pcr NEGATIVE NEGATIVE Final  MRSA PCR Screening     Status: None   Collection Time: 06/30/14  6:14 PM  Result Value Ref Range Status   MRSA by PCR NEGATIVE NEGATIVE Final    Comment:        The GeneXpert MRSA Assay (FDA approved for NASAL specimens only), is one component of a comprehensive MRSA colonization surveillance program. It is not intended to diagnose MRSA infection nor to guide or monitor treatment for MRSA infections.     Studies/Results: Dg Chest 2 View  06/30/2014   CLINICAL DATA:  Shortness of breath with productive cough for 2 weeks. History of COPD.  EXAM: CHEST  2 VIEW  COMPARISON:  06/24/2014  FINDINGS: The cardiac silhouette remains enlarged, not significantly changed. There are small bilateral pleural effusions, slightly increased from prior. Bibasilar lung opacities have mildly increased from prior, particularly in the left lower lobe. No pneumothorax is identified. No acute osseous abnormality is seen.  IMPRESSION: 1. Small bilateral pleural effusions, slightly increased from prior. 2. Increased bibasilar opacities, which may reflect atelectasis versus infection.   Electronically Signed   By: Logan Bores   On: 06/30/2014 14:49   Dg Chest 2 View  06/24/2014   CLINICAL DATA:  Shortness of breath, cough, history of pericardial effusion. Recent chest tube removal.  EXAM: CHEST  2 VIEW  COMPARISON:  06/23/2014; 06/21/2014; 06/20/2014; 06/22/2014  FINDINGS: Grossly unchanged enlarged cardiac silhouette and mediastinal contours. The lungs remain hyperexpanded. Unchanged trace bilateral effusions. There is a small amount of fluid tracking  within the bilateral major fissures. Interval removal of left-sided chest tube. No pneumothorax. Pulmonary venous congestion without frank evidence of edema. Grossly unchanged bilateral infrahilar heterogeneous opacities, right greater left. No new focal airspace opacities. Unchanged bones.  IMPRESSION: 1. Interval removal of left-sided chest tube.  No pneumothorax. 2. Similar findings of cardiomegaly, pulmonary venous congestion and trace bilateral effusions. 3. Grossly unchanged bilateral infrahilar heterogeneous opacities, right greater than left, likely atelectasis.   Electronically Signed   By: Sandi Mariscal M.D.   On: 06/24/2014 07:56   Dg Chest 2 View  06/07/2014   CLINICAL DATA:  Shortness of breath and weakness today; history of pericardial effusion with creation of pericardial window on May 11, 2014  EXAM: CHEST  2 VIEW  COMPARISON:  Portable chest x-ray of May 24, 2014.  FINDINGS: The cardiopericardial silhouette remains mildly enlarged. There remains prominence of the central pulmonary vascularity. There are small pleural effusions layering inferiorly. The pulmonary vascularity exhibits no significant cephalization. The trachea is midline. The bony thorax is unremarkable.  IMPRESSION: There is mild enlargement of the cardiac silhouette with central pulmonary vascular  prominence without cephalization consistent with compensated low-grade CHF. There are small bilateral pleural effusions not greatly changed from the previous study. There is no definite pneumonia.   Electronically Signed   By: David  Martinique   On: 06/07/2014 12:22   Portable Chest 1 View  07/01/2014   CLINICAL DATA:  Shortness of breath.  EXAM: PORTABLE CHEST - 1 VIEW  COMPARISON:  06/30/2014.  FINDINGS: Mediastinum and hilar structures normal. Cardiomegaly with pulmonary vascular congestion and mild pulmonary infiltrates and pleural effusions consistent with congestive heart failure. Bilateral pneumonia cannot be excluded.  Dense atelectasis left lung base. No pneumothorax. No acute osseus abnormality .  IMPRESSION: 1. Interim development of congestive heart failure and pulmonary edema with bilateral pleural effusions. Bibasilar pneumonia cannot be excluded. 2. Dense left lower lobe atelectasis.   Electronically Signed   By: Marcello Moores  Register   On: 07/01/2014 07:40   Dg Chest Port 1 View  06/23/2014   CLINICAL DATA:  Pericardial effusion  EXAM: PORTABLE CHEST - 1 VIEW  COMPARISON:  06/21/2014  FINDINGS: Central venous catheter tip has been removed. Pericardial drainage tube unchanged in position on the left. The heart remains enlarged.  Small bilateral effusions unchanged. Right lower lobe airspace disease slightly increased. Improved aeration in the left lower lobe.  IMPRESSION: Pericardial drainage tube unchanged in position. Cardiac silhouette remains enlarged.  Improved aeration in the left lower lobe. Slight increase in right lower lobe airspace disease. Bilateral effusions unchanged.   Electronically Signed   By: Franchot Gallo M.D.   On: 06/23/2014 07:31   Dg Chest Port 1 View  06/21/2014   CLINICAL DATA:  Pericardial effusion .  EXAM: PORTABLE CHEST - 1 VIEW  COMPARISON:  06/20/2014.  FINDINGS: Right IJ line and left chest tube in stable position. Mediastinum is unremarkable. Stable cardiomegaly. Persistent bibasilar atelectasis and right pleural effusion noted. Small left pleural effusion cannot be excluded. No pneumothorax.  IMPRESSION: 1. Right IJ line and left chest tube in stable position. 2. Persistent bibasilar atelectasis and small right pleural effusion. Small left pleural effusion on today's exam cannot be excluded. 3. Stable cardiomegaly .   Electronically Signed   By: Marcello Moores  Register   On: 06/21/2014 07:30   Dg Chest Port 1 View  06/20/2014   CLINICAL DATA:  Pulmonary edema, tachycardia  EXAM: PORTABLE CHEST - 1 VIEW  COMPARISON:  06/19/2014  FINDINGS: Cardiac shadow remains enlarged. The right jugular central  line is again seen and stable. A left-sided chest tube is noted and stable. No recurrent pneumothorax is seen. Aortic calcifications are again seen. Small right-sided pleural effusion remains. Slight improved aeration is noted in the left lung base.  IMPRESSION: Improved aeration in the bases with small right pleural effusion still identified.  Tubes and lines as described.   Electronically Signed   By: Inez Catalina M.D.   On: 06/20/2014 07:51   Dg Chest Port 1 View  06/19/2014   CLINICAL DATA:  Acute respiratory failure. Shortness of breath. COPD.  EXAM: PORTABLE CHEST - 1 VIEW  COMPARISON:  06/16/2014  FINDINGS: Support apparatus: Right internal jugular line tip at SVC. Left chest tube unchanged in position.  Cardiomediastinal silhouette: Midline trachea. Moderate to marked enlargement of the cardiopericardial silhouette. Transverse aortic atherosclerosis.  Pleura: Small right pleural effusion is slightly decreased. A small left pleural effusion is similar. No pneumothorax.  Lungs: Mild interstitial edema. Improvement in right base airspace disease. Slight worsening left base airspace disease.  Other: None  IMPRESSION: Improvement  in mild interstitial edema.  Similar small bilateral pleural effusions.  Shifting bibasilar airspace disease which could represent atelectasis or infection.   Electronically Signed   By: Abigail Miyamoto M.D.   On: 06/19/2014 08:18   Dg Chest Port 1 View  06/16/2014   CLINICAL DATA:  Pulmonary edema.  EXAM: PORTABLE CHEST - 1 VIEW  COMPARISON:  06/14/2014.  FINDINGS: Right IJ line and left chest tube in stable position. Cardiomegaly. Pulmonary vascularity normal. Persistent bibasilar pulmonary infiltrates and bilateral pleural effusions. No pneumothorax. No acute osseous abnormality.  IMPRESSION: 1. Lines and tubes in stable position. 2. Persistent bibasilar pulmonary infiltrates and bilateral pleural effusions. Bibasilar pulmonary infiltrates could be related to pneumonia and/or  pulmonary edema. 3. Persistent severe cardiomegaly.   Electronically Signed   By: Marcello Moores  Register   On: 06/16/2014 07:33   Dg Chest Port 1 View  06/14/2014   CLINICAL DATA:  History of asthma, COPD, diabetes and hypertension. Chest tube placement.  EXAM: PORTABLE CHEST - 1 VIEW  COMPARISON:  06/13/2014; 06/11/2014; 06/09/2014  FINDINGS: Grossly unchanged enlarged cardiac silhouette and mediastinal contours. Stable position of support apparatus. No pneumothorax. Worsening consolidative opacities within the right middle lobe. Unchanged small bilateral effusions and associated bibasilar opacities. Pulmonary venous congestion without frank evidence of edema. Unchanged bones.  IMPRESSION: 1.  Stable positioning of support apparatus.  No pneumothorax. 2. Worsening right middle lobe consolidative opacities favored to represent a combination of airspace disease and atelectasis/collapse. 3. Otherwise, unchanged small effusions and associated bibasilar opacities, atelectasis versus infiltrate. 4. Pulmonary venous congestion without frank evidence of edema   Electronically Signed   By: Sandi Mariscal M.D.   On: 06/14/2014 10:02   Dg Chest Port 1 View  06/13/2014   CLINICAL DATA:  Hypoxia  EXAM: PORTABLE CHEST - 1 VIEW  COMPARISON:  June 12, 2014  FINDINGS: Central catheter tip is in the superior vena cava near the cavoatrial junction. There are bilateral effusions with bibasilar consolidation. There is edema with cardiomegaly and pulmonary venous hypertension. No pneumothorax. No adenopathy. No bone lesions.  IMPRESSION: Evidence of congestive heart failure. Question superimposed pneumonia in the bases. Central catheter tip in superior vena cava near the cavoatrial junction. No pneumothorax.   Electronically Signed   By: Lowella Grip M.D.   On: 06/13/2014 19:14   Dg Chest Port 1 View  06/12/2014   CLINICAL DATA:  Pericardial effusion  EXAM: PORTABLE CHEST - 1 VIEW  COMPARISON:  06/11/2014  FINDINGS:  Cardiomegaly again noted. Stable right IJ central line position. No pulmonary edema. Persistent left basilar atelectasis or infiltrate. There is small right pleural effusion with right basilar atelectasis or infiltrate. No pneumothorax.  IMPRESSION: Small right pleural effusion with right basilar atelectasis or infiltrate. Stable left basilar atelectasis or infiltrate. No pulmonary edema. Cardiomegaly again noted.   Electronically Signed   By: Lahoma Crocker M.D.   On: 06/12/2014 11:01   Dg Chest Port 1 View  06/11/2014   CLINICAL DATA:  Chest tube placement  EXAM: PORTABLE CHEST - 1 VIEW  COMPARISON:  Yesterday  FINDINGS: Central venous catheter stable. Cardiomegaly stable. Right basilar consolidation worse. Increasing patchy density at the left base. Left chest tube in place. No pneumothorax.  IMPRESSION: Increasing consolidation at the right lung base. Stable left chest tube without pneumothorax.   Electronically Signed   By: Maryclare Bean M.D.   On: 06/11/2014 08:44   Dg Chest Port 1 View  06/10/2014   CLINICAL DATA:  Status post  pericardial window  EXAM: PORTABLE CHEST - 1 VIEW  COMPARISON:  06/09/2014  FINDINGS: The endotracheal tube and nasogastric catheter been removed in the interval. A right jugular line remains. Drainage catheter remains over the left lung base/ pericardium. The cardiac shadow remains enlarged. Vascular congestion remains. Right basilar atelectasis is again noted.  IMPRESSION: Interval removal of nasogastric catheter and endotracheal tube. No new focal abnormality is seen.   Electronically Signed   By: Inez Catalina M.D.   On: 06/10/2014 07:54   Portable Chest Xray  06/09/2014   CLINICAL DATA:  Acute respiratory failure with hypoxia  EXAM: PORTABLE CHEST - 1 VIEW  COMPARISON:  06/08/2014  FINDINGS: Endotracheal tube in good position. Right jugular catheter tip in the SVC. NG tube in place with the tip not visualized. No pneumothorax  Allowing for lighter technique on the current study,  no significant change from yesterday. Pulmonary vascular congestion. Bibasilar atelectasis unchanged.  IMPRESSION: Allowing for later technique, no significant change. Vascular congestion and bibasilar atelectasis.  Support lines remain in good position.   Electronically Signed   By: Franchot Gallo M.D.   On: 06/09/2014 07:21   Dg Chest Port 1 View  06/08/2014   CLINICAL DATA:  Pericardial effusion.  EXAM: PORTABLE CHEST - 1 VIEW  COMPARISON:  06/07/2014, 05/23/2014, 05/15/2014, 05/11/2014.  FINDINGS: Endotracheal tube and right IJ line in good anatomic position. Interim removal of NG tube. Cardiomegaly slight prominence of pulmonary vascularity. Previously identified pulmonary interstitial edema has partially clear. Bibasilar infiltrates with small pleural effusions again noted. These changes could be related to pulmonary edema and/or basilar pneumonia.  IMPRESSION: 1. Endotracheal tube and right IJ line in stable position. Interim removal of NG tube. 2. Congestive heart failure with improving pulmonary interstitial edema. Persistent bibasilar alveolar infiltrates and small pleural effusions. These changes could be related congestive heart failure and/or pneumonia.   Electronically Signed   By: Marcello Moores  Register   On: 06/08/2014 16:37    Medications: Scheduled Meds: . antiseptic oral rinse  7 mL Mouth Rinse q12n4p  . arformoterol  15 mcg Nebulization BID  . budesonide  0.25 mg Nebulization BID  . chlorhexidine  15 mL Mouth Rinse BID  . heparin  5,000 Units Subcutaneous 3 times per day  . hydrALAZINE  25 mg Oral 3 times per day  . insulin aspart  0-5 Units Subcutaneous QHS  . insulin aspart  0-9 Units Subcutaneous TID WC  . methylPREDNISolone (SOLU-MEDROL) injection  60 mg Intravenous Q12H  . pantoprazole  40 mg Oral Daily  . pravastatin  40 mg Oral QHS  . senna-docusate  1 tablet Oral QHS  . sertraline  50 mg Oral QHS  . sodium chloride  3 mL Intravenous Q12H  . sodium chloride  3 mL  Intravenous Q12H  . tiotropium  18 mcg Inhalation Daily   Time spent : 25 minutes   LOS: 2 days   Lashandra Arauz M.D. [facility] 07/02/2014, 11:22 AM Pager: [phone]  If 7PM-7AM, please contact night-coverage [email or web address] Password [email or web address]

## 2014-07-03 DIAGNOSIS — I480 Paroxysmal atrial fibrillation: Secondary | ICD-10-CM

## 2014-07-03 LAB — POTASSIUM: Potassium: 4.8 mmol/L (ref 3.5–5.1)

## 2014-07-03 LAB — BASIC METABOLIC PANEL
ANION GAP: 6 (ref 5–15)
BUN: 38 mg/dL — AB (ref 6–23)
CHLORIDE: 100 meq/L (ref 96–112)
CO2: 32 mmol/L (ref 19–32)
CREATININE: 1.82 mg/dL — AB (ref 0.50–1.10)
Calcium: 8.9 mg/dL (ref 8.4–10.5)
GFR, EST AFRICAN AMERICAN: 32 mL/min — AB (ref >90)
GFR, EST NON AFRICAN AMERICAN: 27 mL/min — AB (ref >90)
GLUCOSE: 169 mg/dL — AB (ref 70–99)
POTASSIUM: 4.4 mmol/L (ref 3.5–5.1)
Sodium: 138 mmol/L (ref 135–145)

## 2014-07-03 LAB — CBC
HEMATOCRIT: 28.5 % — AB (ref 36.0–46.0)
HEMOGLOBIN: 8.9 g/dL — AB (ref 12.0–15.0)
MCH: 27.1 pg (ref 26.0–34.0)
MCHC: 31.2 g/dL (ref 30.0–36.0)
MCV: 86.6 fL (ref 78.0–100.0)
Platelets: 358 10*3/uL (ref 150–400)
RBC: 3.29 MIL/uL — ABNORMAL LOW (ref 3.87–5.11)
RDW: 16 % — ABNORMAL HIGH (ref 11.5–15.5)
WBC: 6.8 10*3/uL (ref 4.0–10.5)

## 2014-07-03 LAB — TROPONIN I: Troponin I: 0.05 ng/mL — ABNORMAL HIGH (ref <0.031)

## 2014-07-03 LAB — GLUCOSE, CAPILLARY
GLUCOSE-CAPILLARY: 139 mg/dL — AB (ref 70–99)
Glucose-Capillary: 119 mg/dL — ABNORMAL HIGH (ref 70–99)
Glucose-Capillary: 174 mg/dL — ABNORMAL HIGH (ref 70–99)
Glucose-Capillary: 147 mg/dL — ABNORMAL HIGH (ref 70–99)

## 2014-07-03 LAB — MAGNESIUM: MAGNESIUM: 2.2 mg/dL (ref 1.5–2.5)

## 2014-07-03 MED ORDER — DILTIAZEM HCL 100 MG IV SOLR
5.0000 mg/h | INTRAVENOUS | Status: DC
  Administered 2014-07-03: 15 mg/h via INTRAVENOUS
  Administered 2014-07-03: 10 mg/h via INTRAVENOUS
  Filled 2014-07-03: qty 100

## 2014-07-03 MED ORDER — GUAIFENESIN ER 600 MG PO TB12
600.0000 mg | ORAL_TABLET | Freq: Two times a day (BID) | ORAL | Status: DC
  Administered 2014-07-03 – 2014-07-05 (×5): 600 mg via ORAL
  Filled 2014-07-03 (×7): qty 1

## 2014-07-03 MED ORDER — DILTIAZEM HCL 60 MG PO TABS
60.0000 mg | ORAL_TABLET | Freq: Four times a day (QID) | ORAL | Status: DC
  Administered 2014-07-03 – 2014-07-05 (×7): 60 mg via ORAL
  Filled 2014-07-03 (×11): qty 1

## 2014-07-03 MED ORDER — SODIUM CHLORIDE 0.9 % IV SOLN
INTRAVENOUS | Status: DC
  Administered 2014-07-03: 75 mL/h via INTRAVENOUS

## 2014-07-03 NOTE — Progress Notes (Signed)
Pt went back to afib after coughing spell while in the bed for -M LYnch notified w/ order to restart cardizem gtt w/ good result > conversion to SR,added mg and potassium level w/ labs drawn earlier.

## 2014-07-03 NOTE — Progress Notes (Signed)
Patient ID: [name]  female  YQM:[id number]    DOB: 05/03/1946    DOA: 06/30/2014  PCP: Hollace Kinnier, DO   Brief history of present illness  Patient is a 69 year old female, SNF resident with history hyperlipidemia, diabetes, anxiety, chronic respiratory failure on 3-4 L O2, severe COPD, 2 recent admissions for pericardial effusion status post pericardial window 05/11/14 and 12/23. Patient was discharged after a prolonged hospitalization, return back to the ED for shortness of breath, respiratory distress and tachycardia. Patient was placed on BiPAP in ED, was found to have hypercapnic respiratory failure. Echo in ED showed normal contractility without tamponade physiology. Patient was admitted to stepdown on BiPAP.  Assessment/Plan: Principal Problem: Atrial flutter with RVR: HR in 120's  - Prior history of paroxysmal A. fib, had converted to normal sinus rhythm yesterday after IV Cardizem drip was placed. Cardiology was consulted. Patient was transitioned to oral Cardizem. However overnight, patient went back into A. fib with RVR after a coughing spell. The patient was restarted on Cardizem drip.  - Cardiology was consulted, felt slightly positive troponin secondary to demand ischemia given atrial flutter with RVR and COPD exacerbation, no plans for ischemic testing at this time. 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  -2-D echo showed EF of 52-84%, grade 1 diastolic dysfunction, trivial pericardial effusion, compared to 06/07/14 pericardial effusion is smaller.   Active problems   Respiratory failure, acute on chronic with hypoxia, respiratory distress secondary to COPD with acute exacerbation - Currently off BiPAP, respiratory status has been stable - Continue nebulizer, Brovana, Spiriva, Pulmicort    Pericardial effusion - bedside echo in the ER showed no cardiac tamponade - 2-D echo showed EF of 13-24%, grade 1 diastolic  dysfunction, trivial pericardial effusion, compared to 06/07/14 pericardial effusion is smaller.   Acute kidney injury - creatinine worsened to 1.8, baseline 1.6, likely due to A. fib with RVR  - Placed on gentle hydration    Essential hypertension, benign BP currently stable    Diabetes mellitus type II, controlled, with no complications - Continue sliding scale insulin   DVT Prophylaxis: heparin subcutaneous  Code Status:DO NOT RESUSCITATE   Family Communication:  Disposition: monitor in stepdown   Consultants:  Cardiology  Procedures:  BiPAP   Antibiotics:  None    Subjective: Patient seen and examined, respiratory status stable, overnight went back into atrial fibrillation with RVR, currently on Cardizem drip, heart rate still 123 at the time of my examination  Objective: Weight change:   Intake/Output Summary (Last 24 hours) at 07/03/14 0908 Last data filed at 07/03/14 0700  Gross per 24 hour  Intake   1377 ml  Output    851 ml  Net    526 ml   Blood pressure 142/104, pulse 116, temperature 97.9 F (36.6 C), temperature source Oral, resp. rate 28, height 5\' 2"  (1.575 m), weight 73.483 kg (162 lb), SpO2 97 %.  Physical Exam: General: Alert and awake,oriented  CVS: Irregularly irregular , tachycardic ChestDecreased breath sound throughout Abdomen : soft nontender, nondistended, normal bowel sounds  Extremities: no cyanosis, clubbing or edema noted bilaterally   Lab Results: Basic Metabolic Panel:  Recent Labs Lab 07/02/14 1110 07/02/14 2330 07/03/14 0322  NA 141  --  138  K 3.3* 4.8 4.4  CL 112  --  100  CO2 23  --  32  GLUCOSE 191*  --  169*  BUN 27*  --  38*  CREATININE 1.43*  --  1.82*  CALCIUM 6.5*  --  8.9  MG  --  2.2  --    Liver Function Tests:  Recent Labs Lab 07/01/14 0319  AST 23  ALT 40*  ALKPHOS 78  BILITOT 0.5  PROT 6.0  ALBUMIN 2.4*   No results for input(s): LIPASE, AMYLASE in the last 168 hours. No results  for input(s): AMMONIA in the last 168 hours. CBC:  Recent Labs Lab 06/30/14 1502  07/02/14 1110 07/03/14 0322  WBC 7.5  < > 6.6 6.8  NEUTROABS 5.5  --   --   --   HGB 9.2*  < > 7.8* 8.9*  HCT 30.7*  < > 25.1* 28.5*  MCV 90.3  < > 87.5 86.6  PLT 361  < > 319 358  < > = values in this interval not displayed. Cardiac Enzymes:  Recent Labs Lab 07/02/14 1110 07/02/14 1649 07/02/14 2330  TROPONINI 0.03 0.05* 0.05*   BNP: Invalid input(s): POCBNP CBG:  Recent Labs Lab 07/02/14 0829 07/02/14 1159 07/02/14 1639 07/02/14 2112 07/03/14 0809  GLUCAP 94 158* 112* 138* 119*     Micro Results: Recent Results (from the past 240 hour(s))  MRSA PCR Screening     Status: None   Collection Time: 06/30/14  6:14 PM  Result Value Ref Range Status   MRSA by PCR NEGATIVE NEGATIVE Final    Comment:        The GeneXpert MRSA Assay (FDA approved for NASAL specimens only), is one component of a comprehensive MRSA colonization surveillance program. It is not intended to diagnose MRSA infection nor to guide or monitor treatment for MRSA infections.     Studies/Results: Dg Chest 2 View  06/30/2014   CLINICAL DATA:  Shortness of breath with productive cough for 2 weeks. History of COPD.  EXAM: CHEST  2 VIEW  COMPARISON:  06/24/2014  FINDINGS: The cardiac silhouette remains enlarged, not significantly changed. There are small bilateral pleural effusions, slightly increased from prior. Bibasilar lung opacities have mildly increased from prior, particularly in the left lower lobe. No pneumothorax is identified. No acute osseous abnormality is seen.  IMPRESSION: 1. Small bilateral pleural effusions, slightly increased from prior. 2. Increased bibasilar opacities, which may reflect atelectasis versus infection.   Electronically Signed   By: Logan Bores   On: 06/30/2014 14:49   Dg Chest 2 View  06/24/2014   CLINICAL DATA:  Shortness of breath, cough, history of pericardial effusion. Recent  chest tube removal.  EXAM: CHEST  2 VIEW  COMPARISON:  06/23/2014; 06/21/2014; 06/20/2014; 06/22/2014  FINDINGS: Grossly unchanged enlarged cardiac silhouette and mediastinal contours. The lungs remain hyperexpanded. Unchanged trace bilateral effusions. There is a small amount of fluid tracking within the bilateral major fissures. Interval removal of left-sided chest tube. No pneumothorax. Pulmonary venous congestion without frank evidence of edema. Grossly unchanged bilateral infrahilar heterogeneous opacities, right greater left. No new focal airspace opacities. Unchanged bones.  IMPRESSION: 1. Interval removal of left-sided chest tube.  No pneumothorax. 2. Similar findings of cardiomegaly, pulmonary venous congestion and trace bilateral effusions. 3. Grossly unchanged bilateral infrahilar heterogeneous opacities, right greater than left, likely atelectasis.   Electronically Signed   By: Sandi Mariscal M.D.   On: 06/24/2014 07:56   Dg Chest 2 View  06/07/2014   CLINICAL DATA:  Shortness of breath and weakness today; history of pericardial effusion with creation of pericardial window on May 11, 2014  EXAM: CHEST  2 VIEW  COMPARISON:  Portable chest x-ray of May 24, 2014.  FINDINGS: The cardiopericardial silhouette remains mildly enlarged. There remains prominence of the central pulmonary vascularity. There are small pleural effusions layering inferiorly. The pulmonary vascularity exhibits no significant cephalization. The trachea is midline. The bony thorax is unremarkable.  IMPRESSION: There is mild enlargement of the cardiac silhouette with central pulmonary vascular prominence without cephalization consistent with compensated low-grade CHF. There are small bilateral pleural effusions not greatly changed from the previous study. There is no definite pneumonia.   Electronically Signed   By: David  Martinique   On: 06/07/2014 12:22   Portable Chest 1 View  07/01/2014   CLINICAL DATA:  Shortness of breath.   EXAM: PORTABLE CHEST - 1 VIEW  COMPARISON:  06/30/2014.  FINDINGS: Mediastinum and hilar structures normal. Cardiomegaly with pulmonary vascular congestion and mild pulmonary infiltrates and pleural effusions consistent with congestive heart failure. Bilateral pneumonia cannot be excluded. Dense atelectasis left lung base. No pneumothorax. No acute osseus abnormality .  IMPRESSION: 1. Interim development of congestive heart failure and pulmonary edema with bilateral pleural effusions. Bibasilar pneumonia cannot be excluded. 2. Dense left lower lobe atelectasis.   Electronically Signed   By: Marcello Moores  Register   On: 07/01/2014 07:40   Dg Chest Port 1 View  06/23/2014   CLINICAL DATA:  Pericardial effusion  EXAM: PORTABLE CHEST - 1 VIEW  COMPARISON:  06/21/2014  FINDINGS: Central venous catheter tip has been removed. Pericardial drainage tube unchanged in position on the left. The heart remains enlarged.  Small bilateral effusions unchanged. Right lower lobe airspace disease slightly increased. Improved aeration in the left lower lobe.  IMPRESSION: Pericardial drainage tube unchanged in position. Cardiac silhouette remains enlarged.  Improved aeration in the left lower lobe. Slight increase in right lower lobe airspace disease. Bilateral effusions unchanged.   Electronically Signed   By: Franchot Gallo M.D.   On: 06/23/2014 07:31   Dg Chest Port 1 View  06/21/2014   CLINICAL DATA:  Pericardial effusion .  EXAM: PORTABLE CHEST - 1 VIEW  COMPARISON:  06/20/2014.  FINDINGS: Right IJ line and left chest tube in stable position. Mediastinum is unremarkable. Stable cardiomegaly. Persistent bibasilar atelectasis and right pleural effusion noted. Small left pleural effusion cannot be excluded. No pneumothorax.  IMPRESSION: 1. Right IJ line and left chest tube in stable position. 2. Persistent bibasilar atelectasis and small right pleural effusion. Small left pleural effusion on today's exam cannot be excluded. 3. Stable  cardiomegaly .   Electronically Signed   By: Marcello Moores  Register   On: 06/21/2014 07:30   Dg Chest Port 1 View  06/20/2014   CLINICAL DATA:  Pulmonary edema, tachycardia  EXAM: PORTABLE CHEST - 1 VIEW  COMPARISON:  06/19/2014  FINDINGS: Cardiac shadow remains enlarged. The right jugular central line is again seen and stable. A left-sided chest tube is noted and stable. No recurrent pneumothorax is seen. Aortic calcifications are again seen. Small right-sided pleural effusion remains. Slight improved aeration is noted in the left lung base.  IMPRESSION: Improved aeration in the bases with small right pleural effusion still identified.  Tubes and lines as described.   Electronically Signed   By: Inez Catalina M.D.   On: 06/20/2014 07:51   Dg Chest Port 1 View  06/19/2014   CLINICAL DATA:  Acute respiratory failure. Shortness of breath. COPD.  EXAM: PORTABLE CHEST - 1 VIEW  COMPARISON:  06/16/2014  FINDINGS: Support apparatus: Right internal jugular line tip at SVC. Left  chest tube unchanged in position.  Cardiomediastinal silhouette: Midline trachea. Moderate to marked enlargement of the cardiopericardial silhouette. Transverse aortic atherosclerosis.  Pleura: Small right pleural effusion is slightly decreased. A small left pleural effusion is similar. No pneumothorax.  Lungs: Mild interstitial edema. Improvement in right base airspace disease. Slight worsening left base airspace disease.  Other: None  IMPRESSION: Improvement in mild interstitial edema.  Similar small bilateral pleural effusions.  Shifting bibasilar airspace disease which could represent atelectasis or infection.   Electronically Signed   By: Abigail Miyamoto M.D.   On: 06/19/2014 08:18   Dg Chest Port 1 View  06/16/2014   CLINICAL DATA:  Pulmonary edema.  EXAM: PORTABLE CHEST - 1 VIEW  COMPARISON:  06/14/2014.  FINDINGS: Right IJ line and left chest tube in stable position. Cardiomegaly. Pulmonary vascularity normal. Persistent bibasilar pulmonary  infiltrates and bilateral pleural effusions. No pneumothorax. No acute osseous abnormality.  IMPRESSION: 1. Lines and tubes in stable position. 2. Persistent bibasilar pulmonary infiltrates and bilateral pleural effusions. Bibasilar pulmonary infiltrates could be related to pneumonia and/or pulmonary edema. 3. Persistent severe cardiomegaly.   Electronically Signed   By: Marcello Moores  Register   On: 06/16/2014 07:33   Dg Chest Port 1 View  06/14/2014   CLINICAL DATA:  History of asthma, COPD, diabetes and hypertension. Chest tube placement.  EXAM: PORTABLE CHEST - 1 VIEW  COMPARISON:  06/13/2014; 06/11/2014; 06/09/2014  FINDINGS: Grossly unchanged enlarged cardiac silhouette and mediastinal contours. Stable position of support apparatus. No pneumothorax. Worsening consolidative opacities within the right middle lobe. Unchanged small bilateral effusions and associated bibasilar opacities. Pulmonary venous congestion without frank evidence of edema. Unchanged bones.  IMPRESSION: 1.  Stable positioning of support apparatus.  No pneumothorax. 2. Worsening right middle lobe consolidative opacities favored to represent a combination of airspace disease and atelectasis/collapse. 3. Otherwise, unchanged small effusions and associated bibasilar opacities, atelectasis versus infiltrate. 4. Pulmonary venous congestion without frank evidence of edema   Electronically Signed   By: Sandi Mariscal M.D.   On: 06/14/2014 10:02   Dg Chest Port 1 View  06/13/2014   CLINICAL DATA:  Hypoxia  EXAM: PORTABLE CHEST - 1 VIEW  COMPARISON:  June 12, 2014  FINDINGS: Central catheter tip is in the superior vena cava near the cavoatrial junction. There are bilateral effusions with bibasilar consolidation. There is edema with cardiomegaly and pulmonary venous hypertension. No pneumothorax. No adenopathy. No bone lesions.  IMPRESSION: Evidence of congestive heart failure. Question superimposed pneumonia in the bases. Central catheter tip in  superior vena cava near the cavoatrial junction. No pneumothorax.   Electronically Signed   By: Lowella Grip M.D.   On: 06/13/2014 19:14   Dg Chest Port 1 View  06/12/2014   CLINICAL DATA:  Pericardial effusion  EXAM: PORTABLE CHEST - 1 VIEW  COMPARISON:  06/11/2014  FINDINGS: Cardiomegaly again noted. Stable right IJ central line position. No pulmonary edema. Persistent left basilar atelectasis or infiltrate. There is small right pleural effusion with right basilar atelectasis or infiltrate. No pneumothorax.  IMPRESSION: Small right pleural effusion with right basilar atelectasis or infiltrate. Stable left basilar atelectasis or infiltrate. No pulmonary edema. Cardiomegaly again noted.   Electronically Signed   By: Lahoma Crocker M.D.   On: 06/12/2014 11:01   Dg Chest Port 1 View  06/11/2014   CLINICAL DATA:  Chest tube placement  EXAM: PORTABLE CHEST - 1 VIEW  COMPARISON:  Yesterday  FINDINGS: Central venous catheter stable. Cardiomegaly stable. Right basilar consolidation  worse. Increasing patchy density at the left base. Left chest tube in place. No pneumothorax.  IMPRESSION: Increasing consolidation at the right lung base. Stable left chest tube without pneumothorax.   Electronically Signed   By: Maryclare Bean M.D.   On: 06/11/2014 08:44   Dg Chest Port 1 View  06/10/2014   CLINICAL DATA:  Status post pericardial window  EXAM: PORTABLE CHEST - 1 VIEW  COMPARISON:  06/09/2014  FINDINGS: The endotracheal tube and nasogastric catheter been removed in the interval. A right jugular line remains. Drainage catheter remains over the left lung base/ pericardium. The cardiac shadow remains enlarged. Vascular congestion remains. Right basilar atelectasis is again noted.  IMPRESSION: Interval removal of nasogastric catheter and endotracheal tube. No new focal abnormality is seen.   Electronically Signed   By: Inez Catalina M.D.   On: 06/10/2014 07:54   Portable Chest Xray  06/09/2014   CLINICAL DATA:  Acute  respiratory failure with hypoxia  EXAM: PORTABLE CHEST - 1 VIEW  COMPARISON:  06/08/2014  FINDINGS: Endotracheal tube in good position. Right jugular catheter tip in the SVC. NG tube in place with the tip not visualized. No pneumothorax  Allowing for lighter technique on the current study, no significant change from yesterday. Pulmonary vascular congestion. Bibasilar atelectasis unchanged.  IMPRESSION: Allowing for later technique, no significant change. Vascular congestion and bibasilar atelectasis.  Support lines remain in good position.   Electronically Signed   By: Franchot Gallo M.D.   On: 06/09/2014 07:21   Dg Chest Port 1 View  06/08/2014   CLINICAL DATA:  Pericardial effusion.  EXAM: PORTABLE CHEST - 1 VIEW  COMPARISON:  06/07/2014, 05/23/2014, 05/15/2014, 05/11/2014.  FINDINGS: Endotracheal tube and right IJ line in good anatomic position. Interim removal of NG tube. Cardiomegaly slight prominence of pulmonary vascularity. Previously identified pulmonary interstitial edema has partially clear. Bibasilar infiltrates with small pleural effusions again noted. These changes could be related to pulmonary edema and/or basilar pneumonia.  IMPRESSION: 1. Endotracheal tube and right IJ line in stable position. Interim removal of NG tube. 2. Congestive heart failure with improving pulmonary interstitial edema. Persistent bibasilar alveolar infiltrates and small pleural effusions. These changes could be related congestive heart failure and/or pneumonia.   Electronically Signed   By: Marcello Moores  Register   On: 06/08/2014 16:37    Medications: Scheduled Meds: . arformoterol  15 mcg Nebulization BID  . budesonide  0.25 mg Nebulization BID  . diltiazem  30 mg Oral 3 times per day  . heparin  5,000 Units Subcutaneous 3 times per day  . hydrALAZINE  25 mg Oral 3 times per day  . insulin aspart  0-5 Units Subcutaneous QHS  . insulin aspart  0-9 Units Subcutaneous TID WC  . methylPREDNISolone (SOLU-MEDROL)  injection  60 mg Intravenous Q12H  . pantoprazole  40 mg Oral Daily  . pravastatin  40 mg Oral QHS  . senna-docusate  1 tablet Oral QHS  . sertraline  50 mg Oral QHS  . sodium chloride  3 mL Intravenous Q12H  . sodium chloride  3 mL Intravenous Q12H  . tiotropium  18 mcg Inhalation Daily   Time spent : 25 minutes   LOS: 3 days   Neeka Urista M.D. [facility] 07/03/2014, 9:08 AM Pager: [phone]  If 7PM-7AM, please contact night-coverage [email or web address] Password [email or web address]

## 2014-07-03 NOTE — Evaluation (Signed)
Physical Therapy Evaluation Patient Details Name: [name] MRN: [id number] DOB: 1945/10/08 Today's Date: 07/03/2014   History of Present Illness  69 y.o. female admitted to [facility] on 06/30/14 with a PMH of HLD, DM, Significant anxiety, Gout, HTN, obesity, Asthma, COPD on 3L O2 and  2 recent admissions for pericardial effusions s/p pericardial window (05/11/14) and 12/23. Patient discharged 06/24/14 after a prolonged hospitalization for recurrent pericardial effusion and VATS , transferred back to the ER for shortness of breath, increased work of breathing, tachycardia. Found to be tachypneic with a respiratory rate about 25.  Pt in ED on BiPap.  At time of PT eval has been transitioned back to O2 [place].  Pt dx with respiratory failure (acute on chronic with hypoxia), COPD exacerbation, pericardial effusion, and acute kidney injury.    Clinical Impression  Pt is mobilizing well, but extremely limited by DOE with short distance, in room mobility.  She would benefit from returning to SNF for rehab at discharge to increase her activity tolerance before returning home alone.   PT to follow acutely for deficits listed below.       Follow Up Recommendations SNF    Equipment Recommendations  None recommended by PT    Recommendations for Other Services   NA    Precautions / Restrictions Precautions Precautions: Other (comment) Precaution Comments: monitor O2 sats and DOE Restrictions Weight Bearing Restrictions: No      Mobility           Transfers Overall transfer level: Needs assistance Equipment used: Rolling walker (2 wheeled) Transfers: Sit to/from Stand Sit to Stand: Supervision         General transfer comment: Supervision for safety  Ambulation/Gait Ambulation/Gait assistance: Supervision Ambulation Distance (Feet): 20 Feet Assistive device: Rolling walker (2 wheeled) Gait Pattern/deviations: Step-through pattern;Shuffle Gait velocity: decreased   General Gait  Details: Pt with increased WOB during short distance gait in room. She is using PLB throughout on 4 L O2 [place]. She could not walk further due to dyspnea 3/4 with gait.          Balance Overall balance assessment: Needs assistance Sitting-balance support: Feet supported;No upper extremity supported       Standing balance support: Bilateral upper extremity supported;No upper extremity supported;Single extremity supported Standing balance-Leahy Scale: Fair                               Pertinent Vitals/Pain Pain Assessment: No/denies pain    Home Living Family/patient expects to be discharged to:: Skilled nursing facility Living Arrangements: Alone Available Help at Discharge: Friend(s);Available PRN/intermittently Type of Home: Apartment Home Access: Stairs to enter Entrance Stairs-Rails: Right;Left Entrance Stairs-Number of Steps: 8 Home Layout: One level Home Equipment: Walker - 4 wheels;Shower seat - built in Additional Comments: She keeps her walker with a seat on it in the car.  She has home O2 and is normally on 2 L O2 [place] at home per pt report.     Prior Function Level of Independence: Needs assistance   Gait / Transfers Assistance Needed: was participating in PT at SNF with RW for gait.            Hand Dominance   Dominant Hand: Right    Extremity/Trunk Assessment   Upper Extremity Assessment: Generalized weakness           Lower Extremity Assessment: Generalized weakness      Cervical / Trunk Assessment: Normal  Communication   Communication: No difficulties  Cognition Arousal/Alertness: Awake/alert Behavior During Therapy: WFL for tasks assessed/performed Overall Cognitive Status: Within Functional Limits for tasks assessed                         Exercises General Exercises - Upper Extremity Shoulder Flexion: AROM;Both;10 reps;Seated (5 reps x 2 with rest break to regain breathing in the middle) Elbow Flexion:  AROM;Both;10 reps;Seated General Exercises - Lower Extremity Long Arc Quad: AROM;Both;10 reps;Seated Hip ABduction/ADduction: AROM;Both;10 reps;Seated (adduction only against pillow) Hip Flexion/Marching: AROM;Both;10 reps;Seated Toe Raises: AROM;Both;10 reps;Seated Heel Raises: AROM;Both;10 reps;Seated      Assessment/Plan    PT Assessment Patient needs continued PT services  PT Diagnosis Difficulty walking;Abnormality of gait;Generalized weakness   PT Problem List Decreased strength;Decreased activity tolerance;Decreased mobility;Decreased balance;Decreased knowledge of use of DME;Cardiopulmonary status limiting activity  PT Treatment Interventions DME instruction;Gait training;Functional mobility training;Therapeutic activities;Therapeutic exercise;Balance training;Neuromuscular re-education;Patient/family education   PT Goals (Current goals can be found in the Care Plan section) Acute Rehab PT Goals Patient Stated Goal: to get back home is her end goal PT Goal Formulation: With patient Time For Goal Achievement: 07/17/14 Potential to Achieve Goals: Good    Frequency Min 2X/week   Barriers to discharge Decreased caregiver support pt lives alone, family in [place]       End of Session Equipment Utilized During Treatment: Oxygen Activity Tolerance: Patient limited by fatigue;Other (comment) (limited by DOE) Patient left: in chair;with call bell/phone within reach Nurse Communication: Mobility status         Time: 0928-1000 PT Time Calculation (min) (ACUTE ONLY): 32 min   Charges:   PT Evaluation $Initial PT Evaluation Tier I: 1 Procedure PT Treatments $Therapeutic Activity: 8-22 mins        Kambry Takacs B. [place], [place], DPT [phone]   07/03/2014, 10:13 AM

## 2014-07-03 NOTE — Progress Notes (Signed)
Patient Name: [name] Date of Encounter: 07/03/2014     Principal Problem:   Respiratory failure, acute and chronic Active Problems:   Pericardial effusion   Essential hypertension, benign   Diabetes mellitus type II, controlled, with no complications   Respiratory distress   COPD with acute exacerbation   Dyspnea   Palliative care encounter   A-fib    SUBJECTIVE  The patient denies any chest discomfort.  She states that she is not aware of her fast heart rate.  Yesterday she had converted to normal sinus rhythm.  At midnight she went back into atrial flutter with rapid ventricular response.  She complains of excessive mucus causing her to choke and strain to cough.  We will add Mucinex.  CURRENT MEDS . arformoterol  15 mcg Nebulization BID  . budesonide  0.25 mg Nebulization BID  . diltiazem  30 mg Oral 3 times per day  . guaiFENesin  600 mg Oral BID  . heparin  5,000 Units Subcutaneous 3 times per day  . hydrALAZINE  25 mg Oral 3 times per day  . insulin aspart  0-5 Units Subcutaneous QHS  . insulin aspart  0-9 Units Subcutaneous TID WC  . methylPREDNISolone (SOLU-MEDROL) injection  60 mg Intravenous Q12H  . pantoprazole  40 mg Oral Daily  . pravastatin  40 mg Oral QHS  . senna-docusate  1 tablet Oral QHS  . sertraline  50 mg Oral QHS  . sodium chloride  3 mL Intravenous Q12H  . tiotropium  18 mcg Inhalation Daily    OBJECTIVE  Filed Vitals:   07/03/14 0800 07/03/14 0830 07/03/14 0930 07/03/14 0958  BP: 142/104 122/93 134/69   Pulse: 116 115 87 83  Temp: 97.9 F (36.6 C)     TempSrc: Oral     Resp: 28 27 21 23   Height:      Weight:      SpO2: 97% 95% 96% 99%    Intake/Output Summary (Last 24 hours) at 07/03/14 1039 Last data filed at 07/03/14 0900  Gross per 24 hour  Intake   1112 ml  Output    975 ml  Net    137 ml   Filed Weights   06/30/14 1234  Weight: 162 lb (73.483 kg)    PHYSICAL EXAM  General: Pleasant, NAD.  Nasal oxygen  in place Neuro: Alert and oriented X 3. Moves all extremities spontaneously. Psych: Normal affect. HEENT:  Normal  Neck: Supple without bruits or JVD. Lungs:  Distant breath sounds. Heart: Rapid regular atrial flutter with rapid response. no s3, s4, or murmurs. Abdomen: Soft, non-tender, non-distended, BS + x 4.  Extremities: No clubbing, cyanosis or edema. DP/PT/Radials 2+ and equal bilaterally.  Accessory Clinical Findings  CBC  Recent Labs  06/30/14 1502  07/02/14 1110 07/03/14 0322  WBC 7.5  < > 6.6 6.8  NEUTROABS 5.5  --   --   --   HGB 9.2*  < > 7.8* 8.9*  HCT 30.7*  < > 25.1* 28.5*  MCV 90.3  < > 87.5 86.6  PLT 361  < > 319 358  < > = values in this interval not displayed. Basic Metabolic Panel  Recent Labs  07/02/14 1110 07/02/14 2330 07/03/14 0322  NA 141  --  138  K 3.3* 4.8 4.4  CL 112  --  100  CO2 23  --  32  GLUCOSE 191*  --  169*  BUN 27*  --  38*  CREATININE 1.43*  --  1.82*  CALCIUM 6.5*  --  8.9  MG  --  2.2  --    Liver Function Tests  Recent Labs  07/01/14 0319  AST 23  ALT 40*  ALKPHOS 78  BILITOT 0.5  PROT 6.0  ALBUMIN 2.4*   No results for input(s): LIPASE, AMYLASE in the last 72 hours. Cardiac Enzymes  Recent Labs  07/02/14 1110 07/02/14 1649 07/02/14 2330  TROPONINI 0.03 0.05* 0.05*   BNP Invalid input(s): POCBNP D-Dimer No results for input(s): DDIMER in the last 72 hours. Hemoglobin A1C  Recent Labs  07/02/14 0730  HGBA1C 5.9*   Fasting Lipid Panel No results for input(s): CHOL, HDL, LDLCALC, TRIG, CHOLHDL, LDLDIRECT in the last 72 hours. Thyroid Function Tests  Recent Labs  07/01/14 0319  TSH 0.226*    TELE  Atrial flutter with rapid ventricular response  ECG  02-Jul-2014 10:08:36 [facility] ROUTINE RECORD Demand pacemaker; interpretation is based on intrinsic rhythm Atrial flutter with variable A-V block with premature ventricular or aberrantly conducted complexes Low  voltage QRS Nonspecific ST and T wave abnormality Abnormal ECG Radiology/Studies  Dg Chest 2 View  06/30/2014   CLINICAL DATA:  Shortness of breath with productive cough for 2 weeks. History of COPD.  EXAM: CHEST  2 VIEW  COMPARISON:  06/24/2014  FINDINGS: The cardiac silhouette remains enlarged, not significantly changed. There are small bilateral pleural effusions, slightly increased from prior. Bibasilar lung opacities have mildly increased from prior, particularly in the left lower lobe. No pneumothorax is identified. No acute osseous abnormality is seen.  IMPRESSION: 1. Small bilateral pleural effusions, slightly increased from prior. 2. Increased bibasilar opacities, which may reflect atelectasis versus infection.   Electronically Signed   By: Logan Bores   On: 06/30/2014 14:49   Dg Chest 2 View  06/24/2014   CLINICAL DATA:  Shortness of breath, cough, history of pericardial effusion. Recent chest tube removal.  EXAM: CHEST  2 VIEW  COMPARISON:  06/23/2014; 06/21/2014; 06/20/2014; 06/22/2014  FINDINGS: Grossly unchanged enlarged cardiac silhouette and mediastinal contours. The lungs remain hyperexpanded. Unchanged trace bilateral effusions. There is a small amount of fluid tracking within the bilateral major fissures. Interval removal of left-sided chest tube. No pneumothorax. Pulmonary venous congestion without frank evidence of edema. Grossly unchanged bilateral infrahilar heterogeneous opacities, right greater left. No new focal airspace opacities. Unchanged bones.  IMPRESSION: 1. Interval removal of left-sided chest tube.  No pneumothorax. 2. Similar findings of cardiomegaly, pulmonary venous congestion and trace bilateral effusions. 3. Grossly unchanged bilateral infrahilar heterogeneous opacities, right greater than left, likely atelectasis.   Electronically Signed   By: Sandi Mariscal M.D.   On: 06/24/2014 07:56   Dg Chest 2 View  06/07/2014   CLINICAL DATA:  Shortness of breath and weakness  today; history of pericardial effusion with creation of pericardial window on May 11, 2014  EXAM: CHEST  2 VIEW  COMPARISON:  Portable chest x-ray of May 24, 2014.  FINDINGS: The cardiopericardial silhouette remains mildly enlarged. There remains prominence of the central pulmonary vascularity. There are small pleural effusions layering inferiorly. The pulmonary vascularity exhibits no significant cephalization. The trachea is midline. The bony thorax is unremarkable.  IMPRESSION: There is mild enlargement of the cardiac silhouette with central pulmonary vascular prominence without cephalization consistent with compensated low-grade CHF. There are small bilateral pleural effusions not greatly changed from the previous study. There is no definite pneumonia.   Electronically Signed   By:  David  [place]   On: 06/07/2014 12:22   Portable Chest 1 View  07/01/2014   CLINICAL DATA:  Shortness of breath.  EXAM: PORTABLE CHEST - 1 VIEW  COMPARISON:  06/30/2014.  FINDINGS: Mediastinum and hilar structures normal. Cardiomegaly with pulmonary vascular congestion and mild pulmonary infiltrates and pleural effusions consistent with congestive heart failure. Bilateral pneumonia cannot be excluded. Dense atelectasis left lung base. No pneumothorax. No acute osseus abnormality .  IMPRESSION: 1. Interim development of congestive heart failure and pulmonary edema with bilateral pleural effusions. Bibasilar pneumonia cannot be excluded. 2. Dense left lower lobe atelectasis.   Electronically Signed   By: Marcello Moores  Register   On: 07/01/2014 07:40   Dg Chest Port 1 View  06/23/2014   CLINICAL DATA:  Pericardial effusion  EXAM: PORTABLE CHEST - 1 VIEW  COMPARISON:  06/21/2014  FINDINGS: Central venous catheter tip has been removed. Pericardial drainage tube unchanged in position on the left. The heart remains enlarged.  Small bilateral effusions unchanged. Right lower lobe airspace disease slightly increased. Improved aeration  in the left lower lobe.  IMPRESSION: Pericardial drainage tube unchanged in position. Cardiac silhouette remains enlarged.  Improved aeration in the left lower lobe. Slight increase in right lower lobe airspace disease. Bilateral effusions unchanged.   Electronically Signed   By: Franchot Gallo M.D.   On: 06/23/2014 07:31   Dg Chest Port 1 View  06/21/2014   CLINICAL DATA:  Pericardial effusion .  EXAM: PORTABLE CHEST - 1 VIEW  COMPARISON:  06/20/2014.  FINDINGS: Right IJ line and left chest tube in stable position. Mediastinum is unremarkable. Stable cardiomegaly. Persistent bibasilar atelectasis and right pleural effusion noted. Small left pleural effusion cannot be excluded. No pneumothorax.  IMPRESSION: 1. Right IJ line and left chest tube in stable position. 2. Persistent bibasilar atelectasis and small right pleural effusion. Small left pleural effusion on today's exam cannot be excluded. 3. Stable cardiomegaly .   Electronically Signed   By: Marcello Moores  Register   On: 06/21/2014 07:30   Dg Chest Port 1 View  06/20/2014   CLINICAL DATA:  Pulmonary edema, tachycardia  EXAM: PORTABLE CHEST - 1 VIEW  COMPARISON:  06/19/2014  FINDINGS: Cardiac shadow remains enlarged. The right jugular central line is again seen and stable. A left-sided chest tube is noted and stable. No recurrent pneumothorax is seen. Aortic calcifications are again seen. Small right-sided pleural effusion remains. Slight improved aeration is noted in the left lung base.  IMPRESSION: Improved aeration in the bases with small right pleural effusion still identified.  Tubes and lines as described.   Electronically Signed   By: Inez Catalina M.D.   On: 06/20/2014 07:51   Dg Chest Port 1 View  06/19/2014   CLINICAL DATA:  Acute respiratory failure. Shortness of breath. COPD.  EXAM: PORTABLE CHEST - 1 VIEW  COMPARISON:  06/16/2014  FINDINGS: Support apparatus: Right internal jugular line tip at SVC. Left chest tube unchanged in position.   Cardiomediastinal silhouette: Midline trachea. Moderate to marked enlargement of the cardiopericardial silhouette. Transverse aortic atherosclerosis.  Pleura: Small right pleural effusion is slightly decreased. A small left pleural effusion is similar. No pneumothorax.  Lungs: Mild interstitial edema. Improvement in right base airspace disease. Slight worsening left base airspace disease.  Other: None  IMPRESSION: Improvement in mild interstitial edema.  Similar small bilateral pleural effusions.  Shifting bibasilar airspace disease which could represent atelectasis or infection.   Electronically Signed   By: Adria Devon.D.  On: 06/19/2014 08:18   Dg Chest Port 1 View  06/16/2014   CLINICAL DATA:  Pulmonary edema.  EXAM: PORTABLE CHEST - 1 VIEW  COMPARISON:  06/14/2014.  FINDINGS: Right IJ line and left chest tube in stable position. Cardiomegaly. Pulmonary vascularity normal. Persistent bibasilar pulmonary infiltrates and bilateral pleural effusions. No pneumothorax. No acute osseous abnormality.  IMPRESSION: 1. Lines and tubes in stable position. 2. Persistent bibasilar pulmonary infiltrates and bilateral pleural effusions. Bibasilar pulmonary infiltrates could be related to pneumonia and/or pulmonary edema. 3. Persistent severe cardiomegaly.   Electronically Signed   By: Marcello Moores  Register   On: 06/16/2014 07:33   Dg Chest Port 1 View  06/14/2014   CLINICAL DATA:  History of asthma, COPD, diabetes and hypertension. Chest tube placement.  EXAM: PORTABLE CHEST - 1 VIEW  COMPARISON:  06/13/2014; 06/11/2014; 06/09/2014  FINDINGS: Grossly unchanged enlarged cardiac silhouette and mediastinal contours. Stable position of support apparatus. No pneumothorax. Worsening consolidative opacities within the right middle lobe. Unchanged small bilateral effusions and associated bibasilar opacities. Pulmonary venous congestion without frank evidence of edema. Unchanged bones.  IMPRESSION: 1.  Stable positioning of  support apparatus.  No pneumothorax. 2. Worsening right middle lobe consolidative opacities favored to represent a combination of airspace disease and atelectasis/collapse. 3. Otherwise, unchanged small effusions and associated bibasilar opacities, atelectasis versus infiltrate. 4. Pulmonary venous congestion without frank evidence of edema   Electronically Signed   By: Sandi Mariscal M.D.   On: 06/14/2014 10:02   Dg Chest Port 1 View  06/13/2014   CLINICAL DATA:  Hypoxia  EXAM: PORTABLE CHEST - 1 VIEW  COMPARISON:  June 12, 2014  FINDINGS: Central catheter tip is in the superior vena cava near the cavoatrial junction. There are bilateral effusions with bibasilar consolidation. There is edema with cardiomegaly and pulmonary venous hypertension. No pneumothorax. No adenopathy. No bone lesions.  IMPRESSION: Evidence of congestive heart failure. Question superimposed pneumonia in the bases. Central catheter tip in superior vena cava near the cavoatrial junction. No pneumothorax.   Electronically Signed   By: Lowella Grip M.D.   On: 06/13/2014 19:14   Dg Chest Port 1 View  06/12/2014   CLINICAL DATA:  Pericardial effusion  EXAM: PORTABLE CHEST - 1 VIEW  COMPARISON:  06/11/2014  FINDINGS: Cardiomegaly again noted. Stable right IJ central line position. No pulmonary edema. Persistent left basilar atelectasis or infiltrate. There is small right pleural effusion with right basilar atelectasis or infiltrate. No pneumothorax.  IMPRESSION: Small right pleural effusion with right basilar atelectasis or infiltrate. Stable left basilar atelectasis or infiltrate. No pulmonary edema. Cardiomegaly again noted.   Electronically Signed   By: Lahoma Crocker M.D.   On: 06/12/2014 11:01   Dg Chest Port 1 View  06/11/2014   CLINICAL DATA:  Chest tube placement  EXAM: PORTABLE CHEST - 1 VIEW  COMPARISON:  Yesterday  FINDINGS: Central venous catheter stable. Cardiomegaly stable. Right basilar consolidation worse. Increasing  patchy density at the left base. Left chest tube in place. No pneumothorax.  IMPRESSION: Increasing consolidation at the right lung base. Stable left chest tube without pneumothorax.   Electronically Signed   By: Maryclare Bean M.D.   On: 06/11/2014 08:44   Dg Chest Port 1 View  06/10/2014   CLINICAL DATA:  Status post pericardial window  EXAM: PORTABLE CHEST - 1 VIEW  COMPARISON:  06/09/2014  FINDINGS: The endotracheal tube and nasogastric catheter been removed in the interval. A right jugular line remains. Drainage catheter remains  over the left lung base/ pericardium. The cardiac shadow remains enlarged. Vascular congestion remains. Right basilar atelectasis is again noted.  IMPRESSION: Interval removal of nasogastric catheter and endotracheal tube. No new focal abnormality is seen.   Electronically Signed   By: Inez Catalina M.D.   On: 06/10/2014 07:54   Portable Chest Xray  06/09/2014   CLINICAL DATA:  Acute respiratory failure with hypoxia  EXAM: PORTABLE CHEST - 1 VIEW  COMPARISON:  06/08/2014  FINDINGS: Endotracheal tube in good position. Right jugular catheter tip in the SVC. NG tube in place with the tip not visualized. No pneumothorax  Allowing for lighter technique on the current study, no significant change from yesterday. Pulmonary vascular congestion. Bibasilar atelectasis unchanged.  IMPRESSION: Allowing for later technique, no significant change. Vascular congestion and bibasilar atelectasis.  Support lines remain in good position.   Electronically Signed   By: Franchot Gallo M.D.   On: 06/09/2014 07:21   Dg Chest Port 1 View  06/08/2014   CLINICAL DATA:  Pericardial effusion.  EXAM: PORTABLE CHEST - 1 VIEW  COMPARISON:  06/07/2014, 05/23/2014, 05/15/2014, 05/11/2014.  FINDINGS: Endotracheal tube and right IJ line in good anatomic position. Interim removal of NG tube. Cardiomegaly slight prominence of pulmonary vascularity. Previously identified pulmonary interstitial edema has partially  clear. Bibasilar infiltrates with small pleural effusions again noted. These changes could be related to pulmonary edema and/or basilar pneumonia.  IMPRESSION: 1. Endotracheal tube and right IJ line in stable position. Interim removal of NG tube. 2. Congestive heart failure with improving pulmonary interstitial edema. Persistent bibasilar alveolar infiltrates and small pleural effusions. These changes could be related congestive heart failure and/or pneumonia.   Electronically Signed   By: [place]   On: 06/08/2014 16:37    ASSESSMENT AND PLAN 1. Atrial flutter with RVR: She has gone back into atrial flutter at midnight.  Continue IV Cardizem drip. 2. Elevated troponin: This is likely secondary to demand ischemia in the setting of respiratory failure and atrial flutter with RVR. Wall motion is normal on recent echocardiogram. Cardiac enzymes will continue to be cycled. She is a DO NOT RESUSCITATE. I would anticipate no further cardiac workup at this time. 3. End-stage COPD with recent exacerbation: Per attending service.  Will add mucinex. 4. Recurrent pericardial effusion status post pericardial window: Recent echocardiogram with trivial effusion .  5. Acute kidney injury: Creatinine is improved.  6. Hypertension: Blood pressure is stable on IV Cardizem drip 7. Anemia:. 8. DO NOT RESUSCITATE/DO NOT INTUBATE: She has been evaluated by palliative care.   Signed, Darlin Coco MD

## 2014-07-03 NOTE — Progress Notes (Signed)
Patient is currently on 4lnc at this time and is doing good. Patient is in no distress at this time. HR 66, RR 16, SPO2 97 and BP 137/80. Will continue to monitor.

## 2014-07-04 DIAGNOSIS — Z7189 Other specified counseling: Secondary | ICD-10-CM | POA: Insufficient documentation

## 2014-07-04 LAB — BASIC METABOLIC PANEL
Anion gap: 7 (ref 5–15)
BUN: 34 mg/dL — AB (ref 6–23)
CHLORIDE: 96 meq/L (ref 96–112)
CO2: 32 mmol/L (ref 19–32)
CREATININE: 1.79 mg/dL — AB (ref 0.50–1.10)
Calcium: 9.1 mg/dL (ref 8.4–10.5)
GFR calc non Af Amer: 28 mL/min — ABNORMAL LOW (ref >90)
GFR, EST AFRICAN AMERICAN: 32 mL/min — AB (ref >90)
GLUCOSE: 151 mg/dL — AB (ref 70–99)
Potassium: 4.4 mmol/L (ref 3.5–5.1)
Sodium: 135 mmol/L (ref 135–145)

## 2014-07-04 LAB — GLUCOSE, CAPILLARY
GLUCOSE-CAPILLARY: 184 mg/dL — AB (ref 70–99)
Glucose-Capillary: 149 mg/dL — ABNORMAL HIGH (ref 70–99)
Glucose-Capillary: 152 mg/dL — ABNORMAL HIGH (ref 70–99)
Glucose-Capillary: 179 mg/dL — ABNORMAL HIGH (ref 70–99)

## 2014-07-04 MED ORDER — OXYCODONE HCL 5 MG PO TABS: 5.0000 mg | ORAL_TABLET | ORAL | Status: DC | PRN

## 2014-07-04 MED ORDER — PREDNISONE 50 MG PO TABS
60.0000 mg | ORAL_TABLET | Freq: Once | ORAL | Status: AC
  Administered 2014-07-04: 60 mg via ORAL
  Filled 2014-07-04 (×2): qty 1

## 2014-07-04 MED ORDER — PREDNISONE 20 MG PO TABS
40.0000 mg | ORAL_TABLET | Freq: Every day | ORAL | Status: DC
  Administered 2014-07-05: 40 mg via ORAL
  Filled 2014-07-04 (×2): qty 2

## 2014-07-04 NOTE — Progress Notes (Signed)
Patient ID: [name]  female  MLY:[id number]    DOB: 1945-10-15    DOA: 06/30/2014  PCP: Hollace Kinnier, DO   Brief history of present illness  Patient is a 69 year old female, SNF resident with history hyperlipidemia, diabetes, anxiety, chronic respiratory failure on 3-4 L O2, severe COPD, 2 recent admissions for pericardial effusion status post pericardial window 05/11/14 and 12/23. Patient was discharged after a prolonged hospitalization, return back to the ED for shortness of breath, respiratory distress and tachycardia. Patient was placed on BiPAP in ED, was found to have hypercapnic respiratory failure. Echo in ED showed normal contractility without tamponade physiology. Patient was admitted to stepdown on BiPAP.  Assessment/Plan: Principal Problem: Atrial flutter with RVR: HR controlled -Cardiology increased her Cardizem to 60 mg every 6 hours, Cardizem drip off.  -  slightly positive troponin felt secondary to demand ischemia given atrial flutter with RVR and COPD exacerbation, no plans for ischemic testing at this time. 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  -2-D echo showed EF of 81-27%, grade 1 diastolic dysfunction, trivial pericardial effusion, compared to 06/07/14 pericardial effusion is smaller.   Active problems   Respiratory failure, acute on chronic with hypoxia, respiratory distress secondary to COPD with acute exacerbation - Currently off BiPAP, respiratory status has been stable - Continue nebulizer, Brovana, Spiriva, Pulmicort    Pericardial effusion - bedside echo in the ER showed no cardiac tamponade - 2-D echo showed EF of 51-70%, grade 1 diastolic dysfunction, trivial pericardial effusion, compared to 06/07/14 pericardial effusion is smaller.   Acute kidney injury - creatinine worsened to 1.8, baseline 1.6, likely due to A. fib with RVR  - Placed on gentle hydration, creatinine function improved  today to 1.7    Essential hypertension, benign BP currently stable    Diabetes mellitus type II, controlled, with no complications - Continue sliding scale insulin   DVT Prophylaxis: heparin subcutaneous  Code Status:DO NOT RESUSCITATE   Family Communication:  Disposition: Transfer to telemetry floor, if continues to remain stable, will DC home tomorrow   Consultants:  Cardiology  Procedures:  BiPAP   Antibiotics:  None    Subjective: Patient seen and examined, denies any specific complaints, sitting up in the chair, afebrile, no coughing, chest pain or shortness of breath.  Objective: Weight change:   Intake/Output Summary (Last 24 hours) at 07/04/14 0948 Last data filed at 07/04/14 0900  Gross per 24 hour  Intake   2217 ml  Output    550 ml  Net   1667 ml   Blood pressure 155/91, pulse 65, temperature 97.4 F (36.3 C), temperature source Oral, resp. rate 23, height 5\' 2"  (1.575 m), weight 73.483 kg (162 lb), SpO2 100 %.  Physical Exam: General: Alert and awake,oriented  CVS: Irregularly irregular Chest: Decreased breath sound throughout Abdomen : soft nontender, nondistended, normal bowel sounds  Extremities: no cyanosis, clubbing, trace edema noted bilaterally   Lab Results: Basic Metabolic Panel:  Recent Labs Lab 07/02/14 2330 07/03/14 0322 07/04/14 0757  NA  --  138 135  K 4.8 4.4 4.4  CL  --  100 96  CO2  --  32 32  GLUCOSE  --  169* 151*  BUN  --  38* 34*  CREATININE  --  1.82* 1.79*  CALCIUM  --  8.9 9.1  MG 2.2  --   --    Liver Function Tests:  Recent Labs Lab 07/01/14  0319  AST 23  ALT 40*  ALKPHOS 78  BILITOT 0.5  PROT 6.0  ALBUMIN 2.4*   No results for input(s): LIPASE, AMYLASE in the last 168 hours. No results for input(s): AMMONIA in the last 168 hours. CBC:  Recent Labs Lab 06/30/14 1502  07/02/14 1110 07/03/14 0322  WBC 7.5  < > 6.6 6.8  NEUTROABS 5.5  --   --   --   HGB 9.2*  < > 7.8* 8.9*  HCT 30.7*  <  > 25.1* 28.5*  MCV 90.3  < > 87.5 86.6  PLT 361  < > 319 358  < > = values in this interval not displayed. Cardiac Enzymes:  Recent Labs Lab 07/02/14 1110 07/02/14 1649 07/02/14 2330  TROPONINI 0.03 0.05* 0.05*   BNP: Invalid input(s): POCBNP CBG:  Recent Labs Lab 07/02/14 2112 07/03/14 0809 07/03/14 1213 07/03/14 1703 07/03/14 2131  GLUCAP 138* 119* 174* 147* 139*     Micro Results: Recent Results (from the past 240 hour(s))  MRSA PCR Screening     Status: None   Collection Time: 06/30/14  6:14 PM  Result Value Ref Range Status   MRSA by PCR NEGATIVE NEGATIVE Final    Comment:        The GeneXpert MRSA Assay (FDA approved for NASAL specimens only), is one component of a comprehensive MRSA colonization surveillance program. It is not intended to diagnose MRSA infection nor to guide or monitor treatment for MRSA infections.     Studies/Results: Dg Chest 2 View  06/30/2014   CLINICAL DATA:  Shortness of breath with productive cough for 2 weeks. History of COPD.  EXAM: CHEST  2 VIEW  COMPARISON:  06/24/2014  FINDINGS: The cardiac silhouette remains enlarged, not significantly changed. There are small bilateral pleural effusions, slightly increased from prior. Bibasilar lung opacities have mildly increased from prior, particularly in the left lower lobe. No pneumothorax is identified. No acute osseous abnormality is seen.  IMPRESSION: 1. Small bilateral pleural effusions, slightly increased from prior. 2. Increased bibasilar opacities, which may reflect atelectasis versus infection.   Electronically Signed   By: Logan Bores   On: 06/30/2014 14:49   Dg Chest 2 View  06/24/2014   CLINICAL DATA:  Shortness of breath, cough, history of pericardial effusion. Recent chest tube removal.  EXAM: CHEST  2 VIEW  COMPARISON:  06/23/2014; 06/21/2014; 06/20/2014; 06/22/2014  FINDINGS: Grossly unchanged enlarged cardiac silhouette and mediastinal contours. The lungs remain  hyperexpanded. Unchanged trace bilateral effusions. There is a small amount of fluid tracking within the bilateral major fissures. Interval removal of left-sided chest tube. No pneumothorax. Pulmonary venous congestion without frank evidence of edema. Grossly unchanged bilateral infrahilar heterogeneous opacities, right greater left. No new focal airspace opacities. Unchanged bones.  IMPRESSION: 1. Interval removal of left-sided chest tube.  No pneumothorax. 2. Similar findings of cardiomegaly, pulmonary venous congestion and trace bilateral effusions. 3. Grossly unchanged bilateral infrahilar heterogeneous opacities, right greater than left, likely atelectasis.   Electronically Signed   By: Sandi Mariscal M.D.   On: 06/24/2014 07:56   Dg Chest 2 View  06/07/2014   CLINICAL DATA:  Shortness of breath and weakness today; history of pericardial effusion with creation of pericardial window on May 11, 2014  EXAM: CHEST  2 VIEW  COMPARISON:  Portable chest x-ray of May 24, 2014.  FINDINGS: The cardiopericardial silhouette remains mildly enlarged. There remains prominence of the central pulmonary vascularity. There are small pleural effusions layering inferiorly. The  pulmonary vascularity exhibits no significant cephalization. The trachea is midline. The bony thorax is unremarkable.  IMPRESSION: There is mild enlargement of the cardiac silhouette with central pulmonary vascular prominence without cephalization consistent with compensated low-grade CHF. There are small bilateral pleural effusions not greatly changed from the previous study. There is no definite pneumonia.   Electronically Signed   By: David  Martinique   On: 06/07/2014 12:22   Portable Chest 1 View  07/01/2014   CLINICAL DATA:  Shortness of breath.  EXAM: PORTABLE CHEST - 1 VIEW  COMPARISON:  06/30/2014.  FINDINGS: Mediastinum and hilar structures normal. Cardiomegaly with pulmonary vascular congestion and mild pulmonary infiltrates and pleural  effusions consistent with congestive heart failure. Bilateral pneumonia cannot be excluded. Dense atelectasis left lung base. No pneumothorax. No acute osseus abnormality .  IMPRESSION: 1. Interim development of congestive heart failure and pulmonary edema with bilateral pleural effusions. Bibasilar pneumonia cannot be excluded. 2. Dense left lower lobe atelectasis.   Electronically Signed   By: Marcello Moores  Register   On: 07/01/2014 07:40   Dg Chest Port 1 View  06/23/2014   CLINICAL DATA:  Pericardial effusion  EXAM: PORTABLE CHEST - 1 VIEW  COMPARISON:  06/21/2014  FINDINGS: Central venous catheter tip has been removed. Pericardial drainage tube unchanged in position on the left. The heart remains enlarged.  Small bilateral effusions unchanged. Right lower lobe airspace disease slightly increased. Improved aeration in the left lower lobe.  IMPRESSION: Pericardial drainage tube unchanged in position. Cardiac silhouette remains enlarged.  Improved aeration in the left lower lobe. Slight increase in right lower lobe airspace disease. Bilateral effusions unchanged.   Electronically Signed   By: Franchot Gallo M.D.   On: 06/23/2014 07:31   Dg Chest Port 1 View  06/21/2014   CLINICAL DATA:  Pericardial effusion .  EXAM: PORTABLE CHEST - 1 VIEW  COMPARISON:  06/20/2014.  FINDINGS: Right IJ line and left chest tube in stable position. Mediastinum is unremarkable. Stable cardiomegaly. Persistent bibasilar atelectasis and right pleural effusion noted. Small left pleural effusion cannot be excluded. No pneumothorax.  IMPRESSION: 1. Right IJ line and left chest tube in stable position. 2. Persistent bibasilar atelectasis and small right pleural effusion. Small left pleural effusion on today's exam cannot be excluded. 3. Stable cardiomegaly .   Electronically Signed   By: Marcello Moores  Register   On: 06/21/2014 07:30   Dg Chest Port 1 View  06/20/2014   CLINICAL DATA:  Pulmonary edema, tachycardia  EXAM: PORTABLE CHEST - 1 VIEW   COMPARISON:  06/19/2014  FINDINGS: Cardiac shadow remains enlarged. The right jugular central line is again seen and stable. A left-sided chest tube is noted and stable. No recurrent pneumothorax is seen. Aortic calcifications are again seen. Small right-sided pleural effusion remains. Slight improved aeration is noted in the left lung base.  IMPRESSION: Improved aeration in the bases with small right pleural effusion still identified.  Tubes and lines as described.   Electronically Signed   By: Inez Catalina M.D.   On: 06/20/2014 07:51   Dg Chest Port 1 View  06/19/2014   CLINICAL DATA:  Acute respiratory failure. Shortness of breath. COPD.  EXAM: PORTABLE CHEST - 1 VIEW  COMPARISON:  06/16/2014  FINDINGS: Support apparatus: Right internal jugular line tip at SVC. Left chest tube unchanged in position.  Cardiomediastinal silhouette: Midline trachea. Moderate to marked enlargement of the cardiopericardial silhouette. Transverse aortic atherosclerosis.  Pleura: Small right pleural effusion is slightly decreased. A small left  pleural effusion is similar. No pneumothorax.  Lungs: Mild interstitial edema. Improvement in right base airspace disease. Slight worsening left base airspace disease.  Other: None  IMPRESSION: Improvement in mild interstitial edema.  Similar small bilateral pleural effusions.  Shifting bibasilar airspace disease which could represent atelectasis or infection.   Electronically Signed   By: Abigail Miyamoto M.D.   On: 06/19/2014 08:18   Dg Chest Port 1 View  06/16/2014   CLINICAL DATA:  Pulmonary edema.  EXAM: PORTABLE CHEST - 1 VIEW  COMPARISON:  06/14/2014.  FINDINGS: Right IJ line and left chest tube in stable position. Cardiomegaly. Pulmonary vascularity normal. Persistent bibasilar pulmonary infiltrates and bilateral pleural effusions. No pneumothorax. No acute osseous abnormality.  IMPRESSION: 1. Lines and tubes in stable position. 2. Persistent bibasilar pulmonary infiltrates and bilateral  pleural effusions. Bibasilar pulmonary infiltrates could be related to pneumonia and/or pulmonary edema. 3. Persistent severe cardiomegaly.   Electronically Signed   By: Marcello Moores  Register   On: 06/16/2014 07:33   Dg Chest Port 1 View  06/14/2014   CLINICAL DATA:  History of asthma, COPD, diabetes and hypertension. Chest tube placement.  EXAM: PORTABLE CHEST - 1 VIEW  COMPARISON:  06/13/2014; 06/11/2014; 06/09/2014  FINDINGS: Grossly unchanged enlarged cardiac silhouette and mediastinal contours. Stable position of support apparatus. No pneumothorax. Worsening consolidative opacities within the right middle lobe. Unchanged small bilateral effusions and associated bibasilar opacities. Pulmonary venous congestion without frank evidence of edema. Unchanged bones.  IMPRESSION: 1.  Stable positioning of support apparatus.  No pneumothorax. 2. Worsening right middle lobe consolidative opacities favored to represent a combination of airspace disease and atelectasis/collapse. 3. Otherwise, unchanged small effusions and associated bibasilar opacities, atelectasis versus infiltrate. 4. Pulmonary venous congestion without frank evidence of edema   Electronically Signed   By: Sandi Mariscal M.D.   On: 06/14/2014 10:02   Dg Chest Port 1 View  06/13/2014   CLINICAL DATA:  Hypoxia  EXAM: PORTABLE CHEST - 1 VIEW  COMPARISON:  June 12, 2014  FINDINGS: Central catheter tip is in the superior vena cava near the cavoatrial junction. There are bilateral effusions with bibasilar consolidation. There is edema with cardiomegaly and pulmonary venous hypertension. No pneumothorax. No adenopathy. No bone lesions.  IMPRESSION: Evidence of congestive heart failure. Question superimposed pneumonia in the bases. Central catheter tip in superior vena cava near the cavoatrial junction. No pneumothorax.   Electronically Signed   By: Lowella Grip M.D.   On: 06/13/2014 19:14   Dg Chest Port 1 View  06/12/2014   CLINICAL DATA:   Pericardial effusion  EXAM: PORTABLE CHEST - 1 VIEW  COMPARISON:  06/11/2014  FINDINGS: Cardiomegaly again noted. Stable right IJ central line position. No pulmonary edema. Persistent left basilar atelectasis or infiltrate. There is small right pleural effusion with right basilar atelectasis or infiltrate. No pneumothorax.  IMPRESSION: Small right pleural effusion with right basilar atelectasis or infiltrate. Stable left basilar atelectasis or infiltrate. No pulmonary edema. Cardiomegaly again noted.   Electronically Signed   By: Lahoma Crocker M.D.   On: 06/12/2014 11:01   Dg Chest Port 1 View  06/11/2014   CLINICAL DATA:  Chest tube placement  EXAM: PORTABLE CHEST - 1 VIEW  COMPARISON:  Yesterday  FINDINGS: Central venous catheter stable. Cardiomegaly stable. Right basilar consolidation worse. Increasing patchy density at the left base. Left chest tube in place. No pneumothorax.  IMPRESSION: Increasing consolidation at the right lung base. Stable left chest tube without pneumothorax.   Electronically  Signed   By: Maryclare Bean M.D.   On: 06/11/2014 08:44   Dg Chest Port 1 View  06/10/2014   CLINICAL DATA:  Status post pericardial window  EXAM: PORTABLE CHEST - 1 VIEW  COMPARISON:  06/09/2014  FINDINGS: The endotracheal tube and nasogastric catheter been removed in the interval. A right jugular line remains. Drainage catheter remains over the left lung base/ pericardium. The cardiac shadow remains enlarged. Vascular congestion remains. Right basilar atelectasis is again noted.  IMPRESSION: Interval removal of nasogastric catheter and endotracheal tube. No new focal abnormality is seen.   Electronically Signed   By: Inez Catalina M.D.   On: 06/10/2014 07:54   Portable Chest Xray  06/09/2014   CLINICAL DATA:  Acute respiratory failure with hypoxia  EXAM: PORTABLE CHEST - 1 VIEW  COMPARISON:  06/08/2014  FINDINGS: Endotracheal tube in good position. Right jugular catheter tip in the SVC. NG tube in place with the  tip not visualized. No pneumothorax  Allowing for lighter technique on the current study, no significant change from yesterday. Pulmonary vascular congestion. Bibasilar atelectasis unchanged.  IMPRESSION: Allowing for later technique, no significant change. Vascular congestion and bibasilar atelectasis.  Support lines remain in good position.   Electronically Signed   By: Franchot Gallo M.D.   On: 06/09/2014 07:21   Dg Chest Port 1 View  06/08/2014   CLINICAL DATA:  Pericardial effusion.  EXAM: PORTABLE CHEST - 1 VIEW  COMPARISON:  06/07/2014, 05/23/2014, 05/15/2014, 05/11/2014.  FINDINGS: Endotracheal tube and right IJ line in good anatomic position. Interim removal of NG tube. Cardiomegaly slight prominence of pulmonary vascularity. Previously identified pulmonary interstitial edema has partially clear. Bibasilar infiltrates with small pleural effusions again noted. These changes could be related to pulmonary edema and/or basilar pneumonia.  IMPRESSION: 1. Endotracheal tube and right IJ line in stable position. Interim removal of NG tube. 2. Congestive heart failure with improving pulmonary interstitial edema. Persistent bibasilar alveolar infiltrates and small pleural effusions. These changes could be related congestive heart failure and/or pneumonia.   Electronically Signed   By: Marcello Moores  Register   On: 06/08/2014 16:37    Medications: Scheduled Meds: . arformoterol  15 mcg Nebulization BID  . budesonide  0.25 mg Nebulization BID  . diltiazem  60 mg Oral Q6H  . guaiFENesin  600 mg Oral BID  . heparin  5,000 Units Subcutaneous 3 times per day  . hydrALAZINE  25 mg Oral 3 times per day  . insulin aspart  0-5 Units Subcutaneous QHS  . insulin aspart  0-9 Units Subcutaneous TID WC  . pantoprazole  40 mg Oral Daily  . pravastatin  40 mg Oral QHS  . [START ON 07/05/2014] predniSONE  40 mg Oral Q breakfast  . senna-docusate  1 tablet Oral QHS  . sertraline  50 mg Oral QHS  . sodium chloride  3 mL  Intravenous Q12H  . tiotropium  18 mcg Inhalation Daily   Time spent : 25 minutes   LOS: 4 days   Mckale Haffey M.D. [facility] 07/04/2014, 9:48 AM Pager: [phone]  If 7PM-7AM, please contact night-coverage [email or web address] Password [email or web address]

## 2014-07-04 NOTE — Progress Notes (Signed)
Patient TG:GYIRSWNIO [name]      DOB: 13-Mar-1946      EVO:[id number]   Palliative Medicine Team at [facility] Progress Note    Subjective: Breathing doing much better. Not symptomatic from episode of Afib with RVR as this has resolved.  Wants to go home. We talked more about hospice care today. Appetite good, no N/V.    Filed Vitals:   07/04/14 0900  BP: 156/80  Pulse: 79  Temp:   Resp: 20   Physical exam: GEN: alert, NAD HEENT: [place], sclera anciteric CV: regular rate Lungs: regular breathing pattern without further pursed lip breathing EXT: warm  CBC    Component Value Date/Time   WBC 6.8 07/03/2014 0322   WBC 4.9 01/21/2014 1127   RBC 3.29* 07/03/2014 0322   RBC 4.07 01/21/2014 1127   RBC 3.75* 11/07/2012 0340   HGB 8.9* 07/03/2014 0322   HCT 28.5* 07/03/2014 0322   PLT 358 07/03/2014 0322   MCV 86.6 07/03/2014 0322   MCH 27.1 07/03/2014 0322   MCH 27.8 01/21/2014 1127   MCHC 31.2 07/03/2014 0322   MCHC 31.7 01/21/2014 1127   RDW 16.0* 07/03/2014 0322   RDW 14.9 01/21/2014 1127   LYMPHSABS 1.3 06/30/2014 1502   LYMPHSABS 1.5 01/21/2014 1127   MONOABS 0.7 06/30/2014 1502   EOSABS 0.0 06/30/2014 1502   EOSABS 0.2 01/21/2014 1127   BASOSABS 0.0 06/30/2014 1502   BASOSABS 0.0 01/21/2014 1127    CMP     Component Value Date/Time   NA 135 07/04/2014 0757   NA 144 01/21/2014 1127   K 4.4 07/04/2014 0757   CL 96 07/04/2014 0757   CO2 32 07/04/2014 0757   GLUCOSE 151* 07/04/2014 0757   GLUCOSE 109* 01/21/2014 1127   BUN 34* 07/04/2014 0757   BUN 19 01/21/2014 1127   CREATININE 1.79* 07/04/2014 0757   CALCIUM 9.1 07/04/2014 0757   PROT 6.0 07/01/2014 0319   PROT 6.7 01/21/2014 1127   ALBUMIN 2.4* 07/01/2014 0319   AST 23 07/01/2014 0319   ALT 40* 07/01/2014 0319   ALKPHOS 78 07/01/2014 0319   BILITOT 0.5 07/01/2014 0319   GFRNONAA 28* 07/04/2014 0757   GFRAA 32* 07/04/2014 0757      Assessment and plan: 69 yo female with ES-COPD, recurrent  pericardial effusion who presented with dyspnea 2/2 hypercapnic resp failure. Palliative consulted for goals of care  1. Code Status: DNR  2. Goals of Care: Spoke again with ernestine today. We have had multiple conversations about hospice care (this admission and past).  She has had some time to contemplate things over the weekend. Ultimately, she wants to go home.  She is pragmatic about her COPD and even asks frank questions about what the end would potentially look like, and what things hospice can do to help in that situation.  She feels like hospice is best plan at this time and asks for home hospice referral rather than home heatlh. I have discussed this with case manager today.  [name] has some friends that will be able to get her things from [facility].  We reviewed her medication list and given her prognosis from COPD, she agrees that it makes sense to stop her statin.  I agree with Dr Tana Coast that if she remains stable through tonight, d/c home with hospice care tomorrow seems very reasonable   3. Symptom Management:  Dyspnea- resolving with COPD treatment. I will restart her home PRN oxycodone and d/c IV dilaudid.  4. Psychosocial/Spiritual: son and daughter live in [place]. She is active at retirement community and enjoys playing cards. Living at home independently prior to recent hospitalizations and return to SNF.   Total Time: 40 minutes >50% of time spent in counseling and coordination of care regarding above.  Doran Clay D.O. Palliative Medicine Team at [facility]  Pager: [phone] Team Phone: [phone]

## 2014-07-04 NOTE — Progress Notes (Addendum)
Late  Entry  Transfer note:  Arrival Method: wheel chair Mental Orientation:A&Ox4 Telemetry: Tx [id number] Assessment: see doc flowsheet Skin: Dry, intact  Pain: Denies pain  Fall Prevention Safety Plan: Reviewed with pt [id number] Orientation: Patient has been oriented to the unit, staff and to the room.

## 2014-07-04 NOTE — Progress Notes (Signed)
CSW (Clinical Education officer, museum) spoke with pt who confirmed she does not want to return to SNF and wants to go home. Pt in good spirits and expressed she is not okay with her condition but she has come to peace with it. Pt informed CSW she will need assistance with transportation at discharge. Pt also very concerned about how to get her belongings from [place] since her children cannot come down to [place] right now. CSW informed pt that CSW would call facility to see if they can help. CSW spoke with facility nurse liaison who informed CSW they would take pt belongings to her house after discharge from the hospital.   CSW notified 6E CSW of pt transfer and above information. This CSW is signing off.  [place], [place]

## 2014-07-05 LAB — BASIC METABOLIC PANEL
Anion gap: 10 (ref 5–15)
BUN: 42 mg/dL — ABNORMAL HIGH (ref 6–23)
CO2: 25 mmol/L (ref 19–32)
Calcium: 8.9 mg/dL (ref 8.4–10.5)
Chloride: 103 mEq/L (ref 96–112)
Creatinine, Ser: 1.86 mg/dL — ABNORMAL HIGH (ref 0.50–1.10)
GFR calc Af Amer: 31 mL/min — ABNORMAL LOW (ref >90)
GFR, EST NON AFRICAN AMERICAN: 27 mL/min — AB (ref >90)
GLUCOSE: 121 mg/dL — AB (ref 70–99)
POTASSIUM: 4.5 mmol/L (ref 3.5–5.1)
Sodium: 138 mmol/L (ref 135–145)

## 2014-07-05 LAB — GLUCOSE, CAPILLARY: Glucose-Capillary: 113 mg/dL — ABNORMAL HIGH (ref 70–99)

## 2014-07-05 MED ORDER — DILTIAZEM HCL ER COATED BEADS 360 MG PO CP24: 360.0000 mg | ORAL_CAPSULE | Freq: Every day | ORAL | Status: DC

## 2014-07-05 MED ORDER — GUAIFENESIN ER 600 MG PO TB12: 600.0000 mg | ORAL_TABLET | Freq: Two times a day (BID) | ORAL | Status: DC

## 2014-07-05 MED ORDER — DILTIAZEM HCL ER COATED BEADS 360 MG PO CP24
360.0000 mg | ORAL_CAPSULE | Freq: Every day | ORAL | Status: DC
  Administered 2014-07-05: 360 mg via ORAL
  Filled 2014-07-05: qty 1

## 2014-07-05 MED ORDER — COLCHICINE 0.6 MG PO TABS: 0.6000 mg | ORAL_TABLET | ORAL | Status: AC | PRN

## 2014-07-05 MED ORDER — ALPRAZOLAM 0.5 MG PO TABS: 0.5000 mg | ORAL_TABLET | Freq: Two times a day (BID) | ORAL | Status: AC | PRN

## 2014-07-05 MED ORDER — PREDNISONE 10 MG PO TABS: ORAL_TABLET | ORAL | Status: AC

## 2014-07-05 NOTE — Progress Notes (Signed)
Physical Therapy Treatment Patient Details Name: [name] MRN: [id number] DOB: 10/23/1945 Today's Date: 07/05/2014    History of Present Illness 69 y.o. female admitted to [facility] on 06/30/14 with a PMH of HLD, DM, Significant anxiety, Gout, HTN, obesity, Asthma, COPD on 3L O2 and  2 recent admissions for pericardial effusions s/p pericardial window (05/11/14) and 12/23. Patient discharged 06/24/14 after a prolonged hospitalization for recurrent pericardial effusion and VATS , transferred back to the ER for shortness of breath, increased work of breathing, tachycardia. Found to be tachypneic with a respiratory rate about 25.  Pt in ED on BiPap.  At time of PT eval has been transitioned back to O2 [place].  Pt dx with respiratory failure (acute on chronic with hypoxia), COPD exacerbation, pericardial effusion, and acute kidney injury.      PT Comments    Pt is anticipating d/c home this afternoon. Discussed home situation and family/friend support available, and pt does not appear to have the appropriate amount of follow-up support needed for optimal safety at d/c. Pt is adamant that she will be returning home and not to a facility. Pt reports that a niece will be with her this weekend, who can hopefully get her some groceries also during visit. Recommended that pt have another RW to keep in the home as she cannot negotiate her rollator up/down the steps to get to her apartment. Per case management, ambulance transport will be arranged for return home. Will continue to follow until d/c.  Follow Up Recommendations  SNF     Equipment Recommendations  RW   Recommendations for Other Services       Precautions / Restrictions Precautions Precautions: Fall Precaution Comments: monitor O2 sats and DOE Restrictions Weight Bearing Restrictions: No    Mobility  Bed Mobility Overal bed mobility: Modified Independent             General bed mobility comments: Pt sitting up in long-sitting in  bed. Able to maneuver around and change her positioning without difficulty.   Transfers Overall transfer level: Needs assistance Equipment used: Rolling walker (2 wheeled) Transfers: Sit to/from Stand Sit to Stand: Supervision         General transfer comment: Supervision for safety  Ambulation/Gait Ambulation/Gait assistance: Supervision Ambulation Distance (Feet): 200 Feet Assistive device: Rolling walker (2 wheeled) Gait Pattern/deviations: Step-through pattern;Decreased stride length;Trunk flexed Gait velocity: decreased Gait velocity interpretation: Below normal speed for age/gender General Gait Details: Good use of pursed-lip breathing and utilizing standing rest breaks when becomming SOB. Pt states she is normally on 2L/min supplemental O2 at home, and started gait training at this O2 level - sats dropped into high 70's. O2 increased to 3L/min and sats increased into mid 80's. Upon seated rest break sats promptly began to increase back to 90%.    Stairs            Wheelchair Mobility    Modified Rankin (Stroke Patients Only)       Balance Overall balance assessment: Needs assistance Sitting-balance support: Feet supported;No upper extremity supported Sitting balance-Leahy Scale: Normal     Standing balance support: No upper extremity supported Standing balance-Leahy Scale: Fair                      Cognition Arousal/Alertness: Awake/alert Behavior During Therapy: WFL for tasks assessed/performed Overall Cognitive Status: Within Functional Limits for tasks assessed  Exercises      General Comments        Pertinent Vitals/Pain Pain Assessment: No/denies pain    Home Living                      Prior Function            PT Goals (current goals can now be found in the care plan section) Acute Rehab PT Goals Patient Stated Goal: Home today PT Goal Formulation: With patient Time For Goal  Achievement: 07/17/14 Potential to Achieve Goals: Good Progress towards PT goals: Progressing toward goals    Frequency  Min 3X/week    PT Plan Current plan remains appropriate    Co-evaluation             End of Session Equipment Utilized During Treatment: Gait belt;Oxygen Activity Tolerance: Patient tolerated treatment well Patient left: in bed;with call bell/phone within reach     Time: 1021-1047 PT Time Calculation (min) (ACUTE ONLY): 26 min  Charges:  $Gait Training: 8-22 mins $Therapeutic Activity: 8-22 mins                    G Codes:      Rolinda Roan 2014/07/13, 12:11 PM   Rolinda Roan, PT, DPT Acute Rehabilitation Services Pager: [phone]

## 2014-07-05 NOTE — Care Management Note (Signed)
CARE MANAGEMENT NOTE 07/05/2014  Patient:  [name], [name]   Account Number:  [id number]  Date Initiated:  07/01/2014  Documentation initiated by:  Elissa Hefty  Subjective/Objective Assessment:   resp failure-bipap     Action/Plan:   from [facility]   Anticipated DC Date:     Anticipated DC Plan:  [place] referral  Clinical Social Worker      DC Forensic scientist  CM consult      [facility] Choice  HOME HEALTH   Choice offered to / List presented to:  C-1 Patient        [place] arranged  HH-1 RN  [place] [facility]      [facility] agency  [place]   Status of service:  Completed, signed off Medicare Important Message given?  YES (If response is "NO", the following Medicare IM given date fields will be blank) Date Medicare IM given:  07/04/2014 Medicare IM given by:  Elissa Hefty Date Additional Medicare IM given:   Additional Medicare IM given by:    Discharge Disposition:  [place]  Per UR Regulation:  Reviewed for med. necessity/level of care/duration of stay  If discussed at [place] of Stay Meetings, dates discussed:   07/05/2014    Comments:    07/05/2014 Pt now interested in [facility] services as she is not interesting in changing her O2 provider as would be needed with Hospice services. Pt selected [facility] for [facility] services , that agency has been notified. Pt wants a shower chair, we will ask for the HHPT to eval the pt bath for the appropriate chair.  CRoyal RN MPH, case manager, [phone]  1/18  1051a debbie dowell rn,bsn spoke w pt. she plans to go home w hospice and wants [facility] [place]. ref to [place] who will come  over today and speak w pt.  1/15 0952 debbie dowell rn,bsn [facility] rep came by. they had been set up for home when pt leaves [facility]. will cont to follow.

## 2014-07-05 NOTE — Discharge Summary (Signed)
Physician Discharge Summary  [name] [facility] HUT:[id number] DOB: 02-27-1946 DOA: 06/30/2014  PCP: Hollace Kinnier, DO  Admit date: 06/30/2014 Discharge date: 07/05/2014  Time spent: 50 minutes  Recommendations for Outpatient Follow-up:  1. Follow-up with Dr. Chase Caller scheduled appt on 07/14/14 at 11:15 am for hospital FU 2. Follow-up with [facility] cardiology at scheduled appt on 08/11/14 at 8:30 am for hospital FU 3. Follow up with PCP for creatinine and hemoglobin recheck 4. Discharge home with home health PT   Discharge Diagnoses:  Principal Problem:   Respiratory failure, acute and chronic Active Problems:   Pericardial effusion   Essential hypertension, benign   Diabetes mellitus type II, controlled, with no complications   Respiratory distress   COPD with acute exacerbation   Dyspnea   Palliative care encounter   A-fib   Encounter for hospice care discussion   Discharge Condition: Stable  Diet recommendation: Carb modified  Filed Weights   06/30/14 1234  Weight: 73.483 kg (162 lb)    History of present illness:  Patient is a 69 year old female, SNF resident with PMH of hyper lipidemia, diabetes, anxiety, chronic respiratory failure on 3-4 L O2, severe COPD, 2 recent admissions for pericardial effusion s/p pericardial window 05/11/14 and 12/23. Patient was discharged after a prolonged hospitalization, returns back to the ED for shortness of breath, respiratory distress, and tachycardia.  Patient was placed on BiPAP in ED, was found to have hypercarpnic respiratory failure.  Echo in ED showed normal contractility without temponade physiology Patient was admitted to stepdown on BiPAP.  Hospital Course:  Atrial flutter with RVR -patient was followed with cardiology, she was placed on  Cardizem drip, and was transitioned to PO Cardizem. Troponin was slightly elevated, but is thought to be secondary to demand ischemia given atrial flutter with RVR and COPD exacerbation, and  thus no ischemic testing was pursued.  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  2-D echo showed EF of 65-68%, grade 1 diastolic dysfunction, moderate LVH.  smaller pericardial effusion compared to previous study on 06/07/2014. On discharge, heart rate is controlled. -continue cardiazem 360 mg daily - Follow-up with outpatient cardiology on scheduled appointment.  Respiratory failure, acute on chronic with hypoxia, respiratory distress secondary to COPD with acute exacerbation -Patient presented with shortness of breath requiring placement on BiPAP.  Patient was placed on nebulizer, Brovana, Spiriva, and Pulmicort during hospitalization.  Due to improvement in respiratory status, pt has been weaned off BiPAP.  On discharge, pt is stable and is on 3L [place].  -on discharge, take prednisone taper as instructed,  continue Spiriva, Brovana, Pulmicort, and proventil. -Follow-up with outpatient pulmonology Dr. Chase Caller on scheduled appointment.  Pericardial effusion -Bedside echo in the ER showed no cardiac temponade.  2D echo showed EF of 12-75%, grade 1 diastolic dysfunction, trivial pericardial effusion, compared to 06/07/14 pericardial is smaller.  Acute kidney injury During hospitalization, creatinine was slightly increased from baseline of 1.6 to 1.8, likely secondary to A. Fib with RVR.  Patient was maintained on IVF. On discharge, creatinine at 1.8 -Follow up with PCP for BMET recheck  Essential hypertension, benign Blood pressure remained stable, patient was placed on diltiazem and hydralazine during hospitalization.   -On discharge,continue Hydralazine 25 MG every 8 hours, Diltiazem 360 MG daily,  Diabetes mellitus type 2, controlled, with no complications Patient was placed on sliding scale insulin, oral medication was held. -On discharge, continue sliding scale insulin.  Normocytic anemia Hemoglobin 8.9 on  discharge.   Likely secondary to chronic disease.  -Follow-up CBC recheck with PCP  Anxiety Stable no SI, HI.  On discharge, continue Zoloft 50 MG daily bedtime -on discharge, continue Xanax PRN  Gout No complaints.  Continue colchicine for gout flareup  Hyperlipidemia Continue lovastatin 40 MG daily  GERD Continue Protonix 40 MG daily  Procedures:  2D echo- EF 55-60%, moderate LVH, no wall motion abnormalities  Consultations:  Cardiology  Discharge Exam: Filed Vitals:   07/05/14 0744  BP: 153/84  Pulse: 78  Temp: 98.2 F (36.8 C)  Resp: 17     Exam General: Alert and oriented AA female in NAD. On [place] O2 Eyes: Anicteric account.  Cardiovascular: Regular rate and rhythm.  No murmurs, rubs, or gallops. Respiratory: Clear to auscultate bilaterally.  No rhonchi or crepitations. Abdomen: Soft nontender bowel sounds present. No guarding or rigidity.  Musculoskeletal: trace edema noted bilaterally Psychiatric: Appears normal.  Neurologic: Alert awake oriented to time place and person.    Discharge Instructions   Medication List    STOP taking these medications        sertraline 50 MG tablet  Commonly known as:  ZOLOFT      TAKE these medications        albuterol (2.5 MG/3ML) 0.083% nebulizer solution  Commonly known as:  PROVENTIL  Take 3 mLs (2.5 mg total) by nebulization every 3 (three) hours as needed for wheezing or shortness of breath.     ALPRAZolam 0.5 MG tablet  Commonly known as:  XANAX  Take 1 tablet (0.5 mg total) by mouth 2 (two) times daily as needed for anxiety.     arformoterol 15 MCG/2ML Nebu  Commonly known as:  BROVANA  Take 2 mLs (15 mcg total) by nebulization 2 (two) times daily.     budesonide 0.25 MG/2ML nebulizer solution  Commonly known as:  PULMICORT  Take 2 mLs (0.25 mg total) by nebulization 2 (two) times daily.     colchicine 0.6 MG tablet  Take 1 tablet (0.6 mg total) by mouth as needed (for gout flare up).     diltiazem 360 MG 24  hr capsule  Commonly known as:  CARDIZEM CD  Take 1 capsule (360 mg total) by mouth daily.     feeding supplement (GLUCERNA SHAKE) Liqd  Take 237 mLs by mouth 3 (three) times daily between meals.     guaiFENesin 600 MG 12 hr tablet  Commonly known as:  MUCINEX  Take 1 tablet (600 mg total) by mouth 2 (two) times daily.     hydrALAZINE 25 MG tablet  Commonly known as:  APRESOLINE  Take 1 tablet (25 mg total) by mouth every 8 (eight) hours.     insulin aspart 100 UNIT/ML injection  Commonly known as:  novoLOG  - SQ qhs   -   - CBG 70 - 120: 0 units  - CBG 121 - 150: 0 units  - CBG 151 - 200: 0 units  - CBG 201 - 250: 2 units  - CBG 251 - 300: 3 units  - CBG 301 - 350: 4 units  - CBG 351 - 400: 5 units  - CBG > 400: call MD     insulin aspart 100 UNIT/ML injection  Commonly known as:  novoLOG  - SQ TID with meals   -   - CBG 70 - 120: 0 units  - CBG 121 - 150: 2 units  - CBG 151 - 200: 3 units  -  CBG 201 - 250: 5 units  - CBG 251 - 300: 8 units  - CBG 301 - 350: 11 units  - CBG 351 - 400: 15 units  - CBG > 400: call MD     lovastatin 40 MG 24 hr tablet  Commonly known as:  ALTOPREV  Take 40 mg by mouth at bedtime.     MULTIVITAMIN ADULTS 50+ Tabs  Take 1 tablet by mouth daily.     oxyCODONE 5 MG immediate release tablet  Commonly known as:  Oxy IR/ROXICODONE  Take 1-2 tablets (5-10 mg total) by mouth every 4 (four) hours as needed for severe pain.     pantoprazole 40 MG tablet  Commonly known as:  PROTONIX  Take 1 tablet (40 mg total) by mouth daily.     predniSONE 10 MG tablet  Commonly known as:  DELTASONE  Prednisone dosing: Take  Prednisone 40mg  (4 tabs) x 1 days, then taper to 30mg  (3 tabs) x 3 days, then 20mg  (2 tabs) x 3days, then 10mg  (1 tab) x 3days, then OFF.  Start taking on:  07/06/2014     senna-docusate 8.6-50 MG per tablet  Commonly known as:  Senokot-S  Take 1 tablet by mouth at bedtime.     tiotropium 18 MCG inhalation  capsule  Commonly known as:  SPIRIVA  Place 1 capsule (18 mcg total) into inhaler and inhale daily.     traMADol 50 MG tablet  Commonly known as:  ULTRAM  Take 1-2 tablets (50-100 mg total) by mouth every 6 (six) hours as needed (mild pain).       Allergies  Allergen Reactions  . Lisinopril Swelling and Other (See Comments)    Angioedema   . Advair Diskus [Fluticasone-Salmeterol] Other (See Comments)    Nose bleeds.   . Levaquin [Levofloxacin] Hives and Other (See Comments)    White marks inside mouth  . Penicillins Hives   Follow-up Information    Follow up with [facility] On 08/11/2014.   Contact information:   [place] [id number] [phone]      Follow up with [facility], MD On 07/14/2014.   Specialty:  Pulmonary Disease   Contact information:   [place] [address] [phone]       Follow up with [facility], [name], DO.   Specialty:  Geriatric Medicine   Contact information:   [address] [place] [address] [place] [address] [phone]        The results of significant diagnostics from this hospitalization (including imaging, microbiology, ancillary and laboratory) are listed below for reference.    Significant Diagnostic Studies: Dg Chest 2 View  06/30/2014   CLINICAL DATA:  Shortness of breath with productive cough for 2 weeks. History of COPD.  EXAM: CHEST  2 VIEW  COMPARISON:  06/24/2014  FINDINGS: The cardiac silhouette remains enlarged, not significantly changed. There are small bilateral pleural effusions, slightly increased from prior. Bibasilar lung opacities have mildly increased from prior, particularly in the left lower lobe. No pneumothorax is identified. No acute osseous abnormality is seen.  IMPRESSION: 1. Small bilateral pleural effusions, slightly increased from prior. 2. Increased bibasilar opacities, which may reflect atelectasis versus infection.    Electronically Signed   By: Logan Bores   On: 06/30/2014 14:49   Dg Chest 2 View  06/24/2014   CLINICAL DATA:  Shortness of breath, cough, history of pericardial effusion. Recent chest tube removal.  EXAM:  CHEST  2 VIEW  COMPARISON:  06/23/2014; 06/21/2014; 06/20/2014; 06/22/2014  FINDINGS: Grossly unchanged enlarged cardiac silhouette and mediastinal contours. The lungs remain hyperexpanded. Unchanged trace bilateral effusions. There is a small amount of fluid tracking within the bilateral major fissures. Interval removal of left-sided chest tube. No pneumothorax. Pulmonary venous congestion without frank evidence of edema. Grossly unchanged bilateral infrahilar heterogeneous opacities, right greater left. No new focal airspace opacities. Unchanged bones.  IMPRESSION: 1. Interval removal of left-sided chest tube.  No pneumothorax. 2. Similar findings of cardiomegaly, pulmonary venous congestion and trace bilateral effusions. 3. Grossly unchanged bilateral infrahilar heterogeneous opacities, right greater than left, likely atelectasis.   Electronically Signed   By: Sandi Mariscal M.D.   On: 06/24/2014 07:56   Dg Chest 2 View  06/07/2014   CLINICAL DATA:  Shortness of breath and weakness today; history of pericardial effusion with creation of pericardial window on May 11, 2014  EXAM: CHEST  2 VIEW  COMPARISON:  Portable chest x-ray of May 24, 2014.  FINDINGS: The cardiopericardial silhouette remains mildly enlarged. There remains prominence of the central pulmonary vascularity. There are small pleural effusions layering inferiorly. The pulmonary vascularity exhibits no significant cephalization. The trachea is midline. The bony thorax is unremarkable.  IMPRESSION: There is mild enlargement of the cardiac silhouette with central pulmonary vascular prominence without cephalization consistent with compensated low-grade CHF. There are small bilateral pleural effusions not greatly changed from the previous  study. There is no definite pneumonia.   Electronically Signed   By: David  Martinique   On: 06/07/2014 12:22   Portable Chest 1 View  07/01/2014   CLINICAL DATA:  Shortness of breath.  EXAM: PORTABLE CHEST - 1 VIEW  COMPARISON:  06/30/2014.  FINDINGS: Mediastinum and hilar structures normal. Cardiomegaly with pulmonary vascular congestion and mild pulmonary infiltrates and pleural effusions consistent with congestive heart failure. Bilateral pneumonia cannot be excluded. Dense atelectasis left lung base. No pneumothorax. No acute osseus abnormality .  IMPRESSION: 1. Interim development of congestive heart failure and pulmonary edema with bilateral pleural effusions. Bibasilar pneumonia cannot be excluded. 2. Dense left lower lobe atelectasis.   Electronically Signed   By: Marcello Moores  Register   On: 07/01/2014 07:40   Dg Chest Port 1 View  06/23/2014   CLINICAL DATA:  Pericardial effusion  EXAM: PORTABLE CHEST - 1 VIEW  COMPARISON:  06/21/2014  FINDINGS: Central venous catheter tip has been removed. Pericardial drainage tube unchanged in position on the left. The heart remains enlarged.  Small bilateral effusions unchanged. Right lower lobe airspace disease slightly increased. Improved aeration in the left lower lobe.  IMPRESSION: Pericardial drainage tube unchanged in position. Cardiac silhouette remains enlarged.  Improved aeration in the left lower lobe. Slight increase in right lower lobe airspace disease. Bilateral effusions unchanged.   Electronically Signed   By: Franchot Gallo M.D.   On: 06/23/2014 07:31   Dg Chest Port 1 View  06/21/2014   CLINICAL DATA:  Pericardial effusion .  EXAM: PORTABLE CHEST - 1 VIEW  COMPARISON:  06/20/2014.  FINDINGS: Right IJ line and left chest tube in stable position. Mediastinum is unremarkable. Stable cardiomegaly. Persistent bibasilar atelectasis and right pleural effusion noted. Small left pleural effusion cannot be excluded. No pneumothorax.  IMPRESSION: 1. Right IJ line  and left chest tube in stable position. 2. Persistent bibasilar atelectasis and small right pleural effusion. Small left pleural effusion on today's exam cannot be excluded. 3. Stable cardiomegaly .   Electronically Signed  By: [place]   On: 06/21/2014 07:30   Dg Chest Port 1 View  06/20/2014   CLINICAL DATA:  Pulmonary edema, tachycardia  EXAM: PORTABLE CHEST - 1 VIEW  COMPARISON:  06/19/2014  FINDINGS: Cardiac shadow remains enlarged. The right jugular central line is again seen and stable. A left-sided chest tube is noted and stable. No recurrent pneumothorax is seen. Aortic calcifications are again seen. Small right-sided pleural effusion remains. Slight improved aeration is noted in the left lung base.  IMPRESSION: Improved aeration in the bases with small right pleural effusion still identified.  Tubes and lines as described.   Electronically Signed   By: Inez Catalina M.D.   On: 06/20/2014 07:51   Dg Chest Port 1 View  06/19/2014   CLINICAL DATA:  Acute respiratory failure. Shortness of breath. COPD.  EXAM: PORTABLE CHEST - 1 VIEW  COMPARISON:  06/16/2014  FINDINGS: Support apparatus: Right internal jugular line tip at SVC. Left chest tube unchanged in position.  Cardiomediastinal silhouette: Midline trachea. Moderate to marked enlargement of the cardiopericardial silhouette. Transverse aortic atherosclerosis.  Pleura: Small right pleural effusion is slightly decreased. A small left pleural effusion is similar. No pneumothorax.  Lungs: Mild interstitial edema. Improvement in right base airspace disease. Slight worsening left base airspace disease.  Other: None  IMPRESSION: Improvement in mild interstitial edema.  Similar small bilateral pleural effusions.  Shifting bibasilar airspace disease which could represent atelectasis or infection.   Electronically Signed   By: Abigail Miyamoto M.D.   On: 06/19/2014 08:18   Dg Chest Port 1 View  06/16/2014   CLINICAL DATA:  Pulmonary edema.  EXAM: PORTABLE  CHEST - 1 VIEW  COMPARISON:  06/14/2014.  FINDINGS: Right IJ line and left chest tube in stable position. Cardiomegaly. Pulmonary vascularity normal. Persistent bibasilar pulmonary infiltrates and bilateral pleural effusions. No pneumothorax. No acute osseous abnormality.  IMPRESSION: 1. Lines and tubes in stable position. 2. Persistent bibasilar pulmonary infiltrates and bilateral pleural effusions. Bibasilar pulmonary infiltrates could be related to pneumonia and/or pulmonary edema. 3. Persistent severe cardiomegaly.   Electronically Signed   By: Marcello Moores  Register   On: 06/16/2014 07:33   Dg Chest Port 1 View  06/14/2014   CLINICAL DATA:  History of asthma, COPD, diabetes and hypertension. Chest tube placement.  EXAM: PORTABLE CHEST - 1 VIEW  COMPARISON:  06/13/2014; 06/11/2014; 06/09/2014  FINDINGS: Grossly unchanged enlarged cardiac silhouette and mediastinal contours. Stable position of support apparatus. No pneumothorax. Worsening consolidative opacities within the right middle lobe. Unchanged small bilateral effusions and associated bibasilar opacities. Pulmonary venous congestion without frank evidence of edema. Unchanged bones.  IMPRESSION: 1.  Stable positioning of support apparatus.  No pneumothorax. 2. Worsening right middle lobe consolidative opacities favored to represent a combination of airspace disease and atelectasis/collapse. 3. Otherwise, unchanged small effusions and associated bibasilar opacities, atelectasis versus infiltrate. 4. Pulmonary venous congestion without frank evidence of edema   Electronically Signed   By: Sandi Mariscal M.D.   On: 06/14/2014 10:02   Dg Chest Port 1 View  06/13/2014   CLINICAL DATA:  Hypoxia  EXAM: PORTABLE CHEST - 1 VIEW  COMPARISON:  June 12, 2014  FINDINGS: Central catheter tip is in the superior vena cava near the cavoatrial junction. There are bilateral effusions with bibasilar consolidation. There is edema with cardiomegaly and pulmonary venous  hypertension. No pneumothorax. No adenopathy. No bone lesions.  IMPRESSION: Evidence of congestive heart failure. Question superimposed pneumonia in the bases. Central catheter tip  in superior vena cava near the cavoatrial junction. No pneumothorax.   Electronically Signed   By: Lowella Grip M.D.   On: 06/13/2014 19:14   Dg Chest Port 1 View  06/12/2014   CLINICAL DATA:  Pericardial effusion  EXAM: PORTABLE CHEST - 1 VIEW  COMPARISON:  06/11/2014  FINDINGS: Cardiomegaly again noted. Stable right IJ central line position. No pulmonary edema. Persistent left basilar atelectasis or infiltrate. There is small right pleural effusion with right basilar atelectasis or infiltrate. No pneumothorax.  IMPRESSION: Small right pleural effusion with right basilar atelectasis or infiltrate. Stable left basilar atelectasis or infiltrate. No pulmonary edema. Cardiomegaly again noted.   Electronically Signed   By: Lahoma Crocker M.D.   On: 06/12/2014 11:01   Dg Chest Port 1 View  06/11/2014   CLINICAL DATA:  Chest tube placement  EXAM: PORTABLE CHEST - 1 VIEW  COMPARISON:  Yesterday  FINDINGS: Central venous catheter stable. Cardiomegaly stable. Right basilar consolidation worse. Increasing patchy density at the left base. Left chest tube in place. No pneumothorax.  IMPRESSION: Increasing consolidation at the right lung base. Stable left chest tube without pneumothorax.   Electronically Signed   By: Maryclare Bean M.D.   On: 06/11/2014 08:44   Dg Chest Port 1 View  06/10/2014   CLINICAL DATA:  Status post pericardial window  EXAM: PORTABLE CHEST - 1 VIEW  COMPARISON:  06/09/2014  FINDINGS: The endotracheal tube and nasogastric catheter been removed in the interval. A right jugular line remains. Drainage catheter remains over the left lung base/ pericardium. The cardiac shadow remains enlarged. Vascular congestion remains. Right basilar atelectasis is again noted.  IMPRESSION: Interval removal of nasogastric catheter and  endotracheal tube. No new focal abnormality is seen.   Electronically Signed   By: Inez Catalina M.D.   On: 06/10/2014 07:54   Portable Chest Xray  06/09/2014   CLINICAL DATA:  Acute respiratory failure with hypoxia  EXAM: PORTABLE CHEST - 1 VIEW  COMPARISON:  06/08/2014  FINDINGS: Endotracheal tube in good position. Right jugular catheter tip in the SVC. NG tube in place with the tip not visualized. No pneumothorax  Allowing for lighter technique on the current study, no significant change from yesterday. Pulmonary vascular congestion. Bibasilar atelectasis unchanged.  IMPRESSION: Allowing for later technique, no significant change. Vascular congestion and bibasilar atelectasis.  Support lines remain in good position.   Electronically Signed   By: Franchot Gallo M.D.   On: 06/09/2014 07:21   Dg Chest Port 1 View  06/08/2014   CLINICAL DATA:  Pericardial effusion.  EXAM: PORTABLE CHEST - 1 VIEW  COMPARISON:  06/07/2014, 05/23/2014, 05/15/2014, 05/11/2014.  FINDINGS: Endotracheal tube and right IJ line in good anatomic position. Interim removal of NG tube. Cardiomegaly slight prominence of pulmonary vascularity. Previously identified pulmonary interstitial edema has partially clear. Bibasilar infiltrates with small pleural effusions again noted. These changes could be related to pulmonary edema and/or basilar pneumonia.  IMPRESSION: 1. Endotracheal tube and right IJ line in stable position. Interim removal of NG tube. 2. Congestive heart failure with improving pulmonary interstitial edema. Persistent bibasilar alveolar infiltrates and small pleural effusions. These changes could be related congestive heart failure and/or pneumonia.   Electronically Signed   By: Marcello Moores  Register   On: 06/08/2014 16:37    Microbiology: Recent Results (from the past 240 hour(s))  MRSA PCR Screening     Status: None   Collection Time: 06/30/14  6:14 PM  Result Value Ref Range Status  MRSA by PCR NEGATIVE NEGATIVE Final     Comment:        The GeneXpert MRSA Assay (FDA approved for NASAL specimens only), is one component of a comprehensive MRSA colonization surveillance program. It is not intended to diagnose MRSA infection nor to guide or monitor treatment for MRSA infections.      Labs: Basic Metabolic Panel:  Recent Labs Lab 07/01/14 0319 07/02/14 1110 07/02/14 2330 07/03/14 0322 07/04/14 0757 07/05/14 0345  NA 140 141  --  138 135 138  K 4.8 3.3* 4.8 4.4 4.4 4.5  CL 97 112  --  100 96 103  CO2 37* 23  --  32 32 25  GLUCOSE 123* 191*  --  169* 151* 121*  BUN 20 27*  --  38* 34* 42*  CREATININE 1.83* 1.43*  --  1.82* 1.79* 1.86*  CALCIUM 9.1 6.5*  --  8.9 9.1 8.9  MG  --   --  2.2  --   --   --    Liver Function Tests:  Recent Labs Lab 07/01/14 0319  AST 23  ALT 40*  ALKPHOS 78  BILITOT 0.5  PROT 6.0  ALBUMIN 2.4*   No results for input(s): LIPASE, AMYLASE in the last 168 hours. No results for input(s): AMMONIA in the last 168 hours. CBC:  Recent Labs Lab 06/30/14 1502 07/01/14 0319 07/02/14 1110 07/03/14 0322  WBC 7.5 6.1 6.6 6.8  NEUTROABS 5.5  --   --   --   HGB 9.2* 9.1* 7.8* 8.9*  HCT 30.7* 30.8* 25.1* 28.5*  MCV 90.3 92.2 87.5 86.6  PLT 361 353 319 358   Cardiac Enzymes:  Recent Labs Lab 06/30/14 2258 07/01/14 0319 07/02/14 1110 07/02/14 1649 07/02/14 2330  TROPONINI 0.07* 0.06* 0.03 0.05* 0.05*   BNP: BNP (last 3 results)  Recent Labs  04/18/14 2005 05/13/14 0500  PROBNP 554.9* 1907.0*   CBG:  Recent Labs Lab 07/04/14 0828 07/04/14 1110 07/04/14 1701 07/04/14 2212 07/05/14 0743  GLUCAP 149* 184* 152* 179* 113*       Signed:  Lacy Duverney PA-C  [facility] 07/05/2014, 10:23 AM   I have personally examined the patient and reviewed the entire database. Agree with the above note and plan as outlined, any necessary changes made.  Lyanna Blystone M.D. [facility] 07/05/2014, 12:26 PM Pager: [phone]  If 7PM-7AM,  please contact night-coverage [email or web address] Password [email or web address]

## 2014-07-05 NOTE — Care Management Note (Signed)
CARE MANAGEMENT NOTE 07/05/2014  Patient:  [name], [name]   Account Number:  [id number]  Date Initiated:  07/01/2014  Documentation initiated by:  Elissa Hefty  Subjective/Objective Assessment:   resp failure-bipap     Action/Plan:   [facility]  07/05/2014 Pt for d/c to home per pt wishes although no recommended by PT or doctor. [facility] and alert system and remoted home teleamontoring ordered as provided by [place].   Anticipated DC Date:  07/05/2014   Anticipated DC Plan:  [place]  In-house referral  Clinical Social Worker      DC Forensic scientist  CM consult      [facility] Choice  HOME HEALTH   Choice offered to / List presented to:  C-1 Patient        [place] arranged  HH-1 RN  [place] PT      [facility] agency  [place]   Status of service:  Completed, signed off Medicare Important Message given?  YES (If response is "NO", the following Medicare IM given date fields will be blank) Date Medicare IM given:  07/04/2014 Medicare IM given by:  Elissa Hefty Date Additional Medicare IM given:   Additional Medicare IM given by:    Discharge Disposition:  [place]  Per UR Regulation:  Reviewed for med. necessity/level of care/duration of stay  If discussed at [place] of Stay Meetings, dates discussed:   07/05/2014    Comments:  07/05/2014 Pt now interested in [facility] services as she is not interesting in changing her O2 provider as would be needed with Hospice services. Pt selected [facility] for [facility] services , that agency has been notified. Pt wants a shower chair, we will ask for the HHPT to eval the pt bath for the appropriate chair.  CRoyal RN MPH, case manager, [phone]  1/18  1051a debbie dowell rn,bsn spoke w pt. she plans to go home w hospice and wants [facility] [place]. ref to [facility] who will come  over today and speak  w pt.  1/15 0952 debbie dowell rn,bsn [facility] rep came by. they had been set up for home [facility]. will cont to follow.

## 2014-07-05 NOTE — Progress Notes (Signed)
Patient verbalized that she wants to be a limited DNR. She states that she wants all life saving measures EXCEPT intubation. She said that she has made that clear in the past but the current order does not reflect this. MD please address. Nurse will report to oncoming RN.

## 2014-07-06 ENCOUNTER — Other Ambulatory Visit: Payer: Self-pay | Admitting: *Deleted

## 2014-07-06 DIAGNOSIS — J9611 Chronic respiratory failure with hypoxia: Secondary | ICD-10-CM

## 2014-07-06 DIAGNOSIS — I4892 Unspecified atrial flutter: Secondary | ICD-10-CM

## 2014-07-06 DIAGNOSIS — J9612 Chronic respiratory failure with hypercapnia: Secondary | ICD-10-CM

## 2014-07-06 DIAGNOSIS — J441 Chronic obstructive pulmonary disease with (acute) exacerbation: Secondary | ICD-10-CM

## 2014-07-06 NOTE — Telephone Encounter (Signed)
Patient very confused about what she needs to be taking. Has been in hospital and nursing facility and has been released. Patient is arguing about what she is suppose to be taking and what is listed in her medication list. Patient stated that she cannot find any of her medications not even the bottle. Scheduled an appointment with Dr. Mariea Clonts to follow up tomorrow. Patient agreed.

## 2014-07-07 ENCOUNTER — Ambulatory Visit (INDEPENDENT_AMBULATORY_CARE_PROVIDER_SITE_OTHER): Payer: Medicare Other | Admitting: Internal Medicine

## 2014-07-07 ENCOUNTER — Encounter: Payer: Self-pay | Admitting: Internal Medicine

## 2014-07-07 VITALS — BP 150/88 | HR 96 | Temp 99.9°F | Resp 20 | Ht 62.0 in | Wt 169.0 lb

## 2014-07-07 DIAGNOSIS — K2100 Gastro-esophageal reflux disease with esophagitis, without bleeding: Secondary | ICD-10-CM

## 2014-07-07 DIAGNOSIS — I3139 Other pericardial effusion (noninflammatory): Secondary | ICD-10-CM

## 2014-07-07 DIAGNOSIS — J441 Chronic obstructive pulmonary disease with (acute) exacerbation: Secondary | ICD-10-CM

## 2014-07-07 DIAGNOSIS — E119 Type 2 diabetes mellitus without complications: Secondary | ICD-10-CM

## 2014-07-07 DIAGNOSIS — J9611 Chronic respiratory failure with hypoxia: Secondary | ICD-10-CM

## 2014-07-07 DIAGNOSIS — E782 Mixed hyperlipidemia: Secondary | ICD-10-CM

## 2014-07-07 DIAGNOSIS — I10 Essential (primary) hypertension: Secondary | ICD-10-CM

## 2014-07-07 DIAGNOSIS — F411 Generalized anxiety disorder: Secondary | ICD-10-CM

## 2014-07-07 DIAGNOSIS — I313 Pericardial effusion (noninflammatory): Secondary | ICD-10-CM

## 2014-07-07 DIAGNOSIS — J449 Chronic obstructive pulmonary disease, unspecified: Secondary | ICD-10-CM

## 2014-07-07 DIAGNOSIS — I319 Disease of pericardium, unspecified: Secondary | ICD-10-CM

## 2014-07-07 MED ORDER — HYDRALAZINE HCL 25 MG PO TABS: 25.0000 mg | ORAL_TABLET | Freq: Three times a day (TID) | ORAL | Status: AC

## 2014-07-07 MED ORDER — LOVASTATIN ER 40 MG PO TB24: 40.0000 mg | ORAL_TABLET | Freq: Every day | ORAL | Status: DC

## 2014-07-07 MED ORDER — PANTOPRAZOLE SODIUM 40 MG PO TBEC: 40.0000 mg | DELAYED_RELEASE_TABLET | Freq: Every day | ORAL | Status: AC

## 2014-07-07 MED ORDER — HYDROCHLOROTHIAZIDE 25 MG PO TABS: 25.0000 mg | ORAL_TABLET | Freq: Every day | ORAL | Status: AC

## 2014-07-07 MED ORDER — LINAGLIPTIN 5 MG PO TABS: 5.0000 mg | ORAL_TABLET | Freq: Every day | ORAL | Status: AC

## 2014-07-07 MED ORDER — TIOTROPIUM BROMIDE MONOHYDRATE 18 MCG IN CAPS: 18.0000 ug | ORAL_CAPSULE | Freq: Every day | RESPIRATORY_TRACT | Status: AC

## 2014-07-07 MED ORDER — DILTIAZEM HCL ER COATED BEADS 360 MG PO CP24: 360.0000 mg | ORAL_CAPSULE | Freq: Every day | ORAL | Status: AC

## 2014-07-07 MED ORDER — ALBUTEROL SULFATE (2.5 MG/3ML) 0.083% IN NEBU: 2.5000 mg | INHALATION_SOLUTION | RESPIRATORY_TRACT | Status: AC | PRN

## 2014-07-07 MED ORDER — ARFORMOTEROL TARTRATE 15 MCG/2ML IN NEBU: 15.0000 ug | INHALATION_SOLUTION | Freq: Two times a day (BID) | RESPIRATORY_TRACT | Status: AC

## 2014-07-07 MED ORDER — BUDESONIDE 0.25 MG/2ML IN SUSP: 0.2500 mg | Freq: Two times a day (BID) | RESPIRATORY_TRACT | Status: AC

## 2014-07-07 NOTE — Progress Notes (Signed)
Patient ID: [name], female   DOB: Dec 28, 1945, 69 y.o.   MRN: [id number]   Location:  [facility]  Code Status: DO NOT INTUBATE now but still wants everything else to be saved as of today; her family lives in [place] (sister, daughter)  Allergies  Allergen Reactions  . Lisinopril Swelling and Other (See Comments)    Angioedema   . Advair Diskus [Fluticasone-Salmeterol] Other (See Comments)    Nose bleeds.   . Levaquin [Levofloxacin] Hives and Other (See Comments)    White marks inside mouth  . Penicillins Hives    Chief Complaint  Patient presents with  . Medical Management of Chronic Issues  . Hospitalization Follow-up    HPI: Patient is a 69 y.o. black female seen in the office today for hospital f/u.  She has been hospitalized three times since I last saw her in August and in rehab at [facility] twice in between.  She has been home for 3 days since her last hospitalization and has NO MEDICATIONS whatsoever.  She has 4 prescriptions from the hospital.  She underwent a pericardial window first 05/11/14.    First was a prolonged respiratory failure on the ventilator for acute copd exacerbation 12/4-12/14.    Second was 06/07/14-07/04/14 with acute on chronic hypoxic and hypercarbic respiratory failure, severe COPD on 3L O2 at baseline, recurrent pericardial effusion, paroxysmal afib and acute kidney injury.  She had been sent to the ED with early cardiac tamponade after her TCTS visit.  She underwent a pericardial window procedure by Dr. Irish Lack which required intubation and mechanical ventilation, chest tubes and pericardial drains.  She took time to wean 12/24.    She was then to f/u with Dr. [name].  She was sent to SNF with pulmicort, brovana, spiriva and prn albuterol plus her oxygen and plans to f/u with Dr. Chase Caller.    BP was controlled with hydralazine and she was continued on her lovastatin.  Renal function had  gotten significantly worse over the course of the hospital stay to where her GFR was too high to tolerate metformin use and it was stopped.  She was managed with sliding scale insulin.  She was also started on xanax for severe anxiety and as needed oxycodone and tramadol for pain after her surgical procedure.  Zoloft was continued at that point.  ON 06/30/14, she was noted to be hypoxic at SNF and sent to the ED where she was tachypneic, sob for 2 days, and she was placed on bipap.  She did not have tamponade this time.  She was treated for afib with RVR with cardizem drip that was then converted to po.  2-D echo showed EF of 99-83%, grade 1 diastolic dysfunction, moderate LVH. smaller pericardial effusion compared to previous study on 06/07/2014.  Her children live in [place].    Was discharged home without a glucometer, insulin prescriptions or instructions of what medications to take.  She called here wanting her old medications.  She apparently lost all of them or her family took them away when she was at snf.    Has been feeling physically ok.  Breathing remains a little rough.  Still a little weak.  Was able to shower independently.  Can use restroom and cook meal w/o getting out of breath.  Trip here today was exhausting.  Nothing hurts.   192 lbs down to 169 lbs.   She did not want to come to the appointment  and was upset that she had to for me to figure out the millions of changes in her medications.    Upon further review, it looks like she was sent to the snf on insulin also, but was never able to be discharged home where she would have gotten the necessary education on giving herself insulin.  She now has no idea what her sugars have been, but [facility] [place] is going to provide her with a new glucometer.    Review of Systems:  Review of Systems  Constitutional: Positive for malaise/fatigue.       Much improved though  HENT: Negative for congestion and hearing loss.   Eyes: Negative for  blurred vision.  Respiratory: Negative for cough and shortness of breath.   Cardiovascular: Positive for leg swelling. Negative for chest pain.  Gastrointestinal: Negative for abdominal pain, constipation, blood in stool and melena.  Genitourinary: Negative for dysuria, urgency and frequency.  Musculoskeletal: Negative for myalgias and falls.  Skin: Negative for rash.  Neurological: Positive for weakness. Negative for dizziness, loss of consciousness and headaches.  Endo/Heme/Allergies: Does not bruise/bleed easily.  Psychiatric/Behavioral: Negative for depression and memory loss. The patient is nervous/anxious.      Past Medical History  Diagnosis Date  . COPD (chronic obstructive pulmonary disease)   . Hypertension   . Diabetes mellitus 09/13/2010    glucophage  . Gout   . Oxygen dependent     pt uses O2   . Pericardial effusion 08/2011    small residual on echo  . Hyperlipidemia   . Personal history of noncompliance with medical treatment, presenting hazards to health   . Unspecified disease of pericardium   . Hemorrhage of rectum and anus   . Panic disorder without agoraphobia   . Depressive disorder, not elsewhere classified   . Anxiety state, unspecified   . Unspecified hereditary and idiopathic peripheral neuropathy   . Obesity, unspecified   . Unspecified tinnitus   . Unspecified essential hypertension   . Pneumonia, organism unspecified   . Chronic obstructive asthma, unspecified   . Acute and chronic respiratory failure   . Urinary frequency   . Urgency of urination   . Anemia due to chronic blood loss 01/04/2013    Past Surgical History  Procedure Laterality Date  . Breast biopsy      both breasts  . Total abdominal hysterectomy    . Esophagogastroduodenoscopy N/A 01/03/2013    Procedure: ESOPHAGOGASTRODUODENOSCOPY (EGD);  Surgeon: Inda Castle, MD;  Location: [place];  Service: Endoscopy;  Laterality: N/A;  . Colonoscopy N/A 01/05/2013    Procedure:  COLONOSCOPY;  Surgeon: Gatha Mayer, MD;  Location: [place];  Service: Endoscopy;  Laterality: N/A;  . Subxyphoid pericardial window N/A 05/11/2014    Procedure: SUBXYPHOID PERICARDIAL WINDOW;  Surgeon: Melrose Nakayama, MD;  Location: [place];  Service: Thoracic;  Laterality: N/A;  . Tee without cardioversion N/A 05/11/2014    Procedure: TRANSESOPHAGEAL ECHOCARDIOGRAM (TEE);  Surgeon: Melrose Nakayama, MD;  Location: [place];  Service: Thoracic;  Laterality: N/A;  . Pericardial window N/A 06/08/2014    Procedure: PERICARDIAL WINDOW;  Surgeon: Melrose Nakayama, MD;  Location: [place];  Service: Thoracic;  Laterality: N/A;  . Video assisted thoracoscopy (vats)/thorocotomy Left 06/08/2014    Procedure: VIDEO ASSISTED THORACOSCOPY (VATS);  Surgeon: Melrose Nakayama, MD;  Location: [place];  Service: Thoracic;  Laterality: Left;    Social History:   reports that she quit smoking about 10 years  ago. Her smoking use included Cigarettes. She has a 30 pack-year smoking history. She has never used smokeless tobacco. She reports that she drinks about 1.0 oz of alcohol per week. She reports that she does not use illicit drugs.  Family History  Problem Relation Age of Onset  . COPD Mother   . COPD Sister   . Hypertension Sister   . Cancer Father   . Asthma Father   . Diabetes Other     3 maternal aunts  . Prostate cancer Paternal Uncle     Medications: Patient's Medications  New Prescriptions   No medications on file  Previous Medications   ALBUTEROL (PROVENTIL) (2.5 MG/3ML) 0.083% NEBULIZER SOLUTION    Take 3 mLs (2.5 mg total) by nebulization every 3 (three) hours as needed for wheezing or shortness of breath.   ALPRAZOLAM (XANAX) 0.5 MG TABLET    Take 1 tablet (0.5 mg total) by mouth 2 (two) times daily as needed for anxiety.   ARFORMOTEROL (BROVANA) 15 MCG/2ML NEBU    Take 2 mLs (15 mcg total) by nebulization 2 (two) times daily.   BUDESONIDE (PULMICORT) 0.25 MG/2ML NEBULIZER  SOLUTION    Take 2 mLs (0.25 mg total) by nebulization 2 (two) times daily.   COLCHICINE 0.6 MG TABLET    Take 1 tablet (0.6 mg total) by mouth as needed (for gout flare up).   DILTIAZEM (CARDIZEM CD) 360 MG 24 HR CAPSULE    Take 1 capsule (360 mg total) by mouth daily.   FEEDING SUPPLEMENT, GLUCERNA SHAKE, (GLUCERNA SHAKE) LIQD    Take 237 mLs by mouth 3 (three) times daily between meals.   GUAIFENESIN (MUCINEX) 600 MG 12 HR TABLET    Take 1 tablet (600 mg total) by mouth 2 (two) times daily.   HYDRALAZINE (APRESOLINE) 25 MG TABLET    Take 1 tablet (25 mg total) by mouth every 8 (eight) hours.   INSULIN ASPART (NOVOLOG) 100 UNIT/ML INJECTION    SQ qhs   CBG 70 - 120: 0 units CBG 121 - 150: 0 units CBG 151 - 200: 0 units CBG 201 - 250: 2 units CBG 251 - 300: 3 units CBG 301 - 350: 4 units CBG 351 - 400: 5 units CBG > 400: call MD   INSULIN ASPART (NOVOLOG) 100 UNIT/ML INJECTION    SQ TID with meals   CBG 70 - 120: 0 units CBG 121 - 150: 2 units CBG 151 - 200: 3 units CBG 201 - 250: 5 units CBG 251 - 300: 8 units CBG 301 - 350: 11 units CBG 351 - 400: 15 units CBG > 400: call MD   LOVASTATIN (ALTOPREV) 40 MG 24 HR TABLET    Take 40 mg by mouth at bedtime.   MULTIPLE VITAMINS-MINERALS (MULTIVITAMIN ADULTS 50+) TABS    Take 1 tablet by mouth daily.   OXYCODONE (OXY IR/ROXICODONE) 5 MG IMMEDIATE RELEASE TABLET    Take 1-2 tablets (5-10 mg total) by mouth every 4 (four) hours as needed for severe pain.   PANTOPRAZOLE (PROTONIX) 40 MG TABLET    Take 1 tablet (40 mg total) by mouth daily.   PREDNISONE (DELTASONE) 10 MG TABLET    Prednisone dosing: Take  Prednisone 40mg  (4 tabs) x 1 days, then taper to 30mg  (3 tabs) x 3 days, then 20mg  (2 tabs) x 3days, then 10mg  (1 tab) x 3days, then OFF.   SENNA-DOCUSATE (SENOKOT-S) 8.6-50 MG PER TABLET    Take 1 tablet by mouth at  bedtime.   TIOTROPIUM (SPIRIVA) 18 MCG INHALATION CAPSULE    Place 1 capsule (18 mcg total) into inhaler and inhale daily.    TRAMADOL (ULTRAM) 50 MG TABLET    Take 1-2 tablets (50-100 mg total) by mouth every 6 (six) hours as needed (mild pain).  Modified Medications   No medications on file  Discontinued Medications   No medications on file     Physical Exam: Filed Vitals:   07/07/14 1555  BP: 150/88  Pulse: 96  Temp: 99.9 F (37.7 C)  TempSrc: Oral  Resp: 20  Height: 5\' 2"  (1.575 m)  Weight: 169 lb (76.658 kg)  SpO2: 89%  Physical Exam  Constitutional: She is oriented to person, place, and time.  Eyes:  glasses  Cardiovascular: Normal rate, regular rhythm and normal heart sounds.   Pulmonary/Chest: Effort normal and breath sounds normal. She has no wheezes. She has no rales.  Wearing 3L O2  Abdominal: Soft. Bowel sounds are normal. She exhibits no distension and no mass. There is no tenderness.  Musculoskeletal:  Walking with rollator walker and portable O2  Neurological: She is alert and oriented to person, place, and time.  Skin: Skin is warm and dry.  Scars:  One laparoscopic scar midchest/upper abdomen with vertical scar adjacent; also horizontal scar midaxillary line with chest tube scar   Psychiatric:  Frustrated mood today     Labs reviewed: Basic Metabolic Panel:  Recent Labs  06/16/14 0500 06/17/14 0400 06/18/14 0410  07/01/14 0319  07/02/14 2330 07/03/14 0322 07/04/14 0757 07/05/14 0345  NA 137 137 135  < > 140  < >  --  138 135 138  K 3.3* 3.8 3.9  < > 4.8  < > 4.8 4.4 4.4 4.5  CL 80* 84* 85*  < > 97  < >  --  100 96 103  CO2 48* 43* 42*  < > 37*  < >  --  32 32 25  GLUCOSE 94 109* 104*  < > 123*  < >  --  169* 151* 121*  BUN 15 14 15   < > 20  < >  --  38* 34* 42*  CREATININE 1.22* 1.30* 1.35*  < > 1.83*  < >  --  1.82* 1.79* 1.86*  CALCIUM 8.5 8.5 8.3*  < > 9.1  < >  --  8.9 9.1 8.9  MG 2.1 2.1 2.3  --   --   --  2.2  --   --   --   PHOS 2.2* 1.9* 3.9  --   --   --   --   --   --   --   TSH  --   --   --   --  0.226*  --   --   --   --   --   < > = values in this  interval not displayed. Liver Function Tests:  Recent Labs  05/20/14 0227 06/10/14 0500 07/01/14 0319  AST 19 15 23   ALT 24 22 40*  ALKPHOS 94 51 78  BILITOT 0.2* 0.2* 0.5  PROT 7.2 4.7* 6.0  ALBUMIN 3.0* 2.4* 2.4*   No results for input(s): LIPASE, AMYLASE in the last 8760 hours. No results for input(s): AMMONIA in the last 8760 hours. CBC:  Recent Labs  05/10/14 0200  05/20/14 0227  06/30/14 1502 07/01/14 0319 07/02/14 1110 07/03/14 0322  WBC 6.5  < > 10.3  < > 7.5 6.1 6.6 6.8  NEUTROABS 5.8  --  8.6*  --  5.5  --   --   --   HGB 9.2*  < > 9.5*  < > 9.2* 9.1* 7.8* 8.9*  HCT 30.9*  < > 32.9*  < > 30.7* 30.8* 25.1* 28.5*  MCV 93.9  < > 94.0  < > 90.3 92.2 87.5 86.6  PLT 244  < > 336  < > 361 353 319 358  < > = values in this interval not displayed. Lipid Panel:  Recent Labs  07/15/13 1218 10/22/13 1136 01/21/14 1127 05/20/14 0328  HDL 79 69 71  --   LDLCALC 127* 162* 155*  --   TRIG 71 93 159* 77  CHOLHDL 2.8 3.6 3.6  --    Lab Results  Component Value Date   HGBA1C 5.9* 07/02/2014  Pericardial Fluid Culture 12/23 >> neg C-Diff 1/7 >> neg  Past Procedures: 04/2011 Spirometry >> Fev1 0.85L/45% 12/22 2D Echo >> Mod LVH, LVEF 55-60%, Grade 1 DD. PA peak pressure 75 mmHg. Large pericardial effusion, c/w early tamponade. 01/08 CXR >> removal of L chest tube, no pneumothorax, mild vascular congestion, trace bilateral pleural effusions, R>L atelectasis   Assessment/Plan 1. Chronic respiratory failure with hypoxia and hypercarbia due to COPD -cont O2 3 liters continuous -advised to fill the prednisone taper prescription from the hospital and Rxs for inhalers and nebs as below -follows with pulmonary, Dr. Oleta Mouse f/u appt -refuses to ever be intubated again   2. Diabetes mellitus type II, controlled, with no complications -was sent home on sliding scale insulin without strips, lancets, glucometer (had lost hers) or any prescriptions for the  insulin--she has had no education on using insulin, injecting it, or how often to check glucose, etc. -advised for cbgs qam when she receives her glucometer from [facility] and write them down - start linagliptin (TRADJENTA) 5 MG TABS tablet; Take 1 tablet (5 mg total) by mouth daily.Dispense: 90 tablet; Refill: 3 -I gave her a month's worth of samples also -cannot take metformin anymore due to renal function--I explained this to her b/c no one had and she couldn't understand why our MA didn't just call it in for her so she didn't have to come in here  3. Pericardial effusion -s/p pericardial window x 2 -has recovered well -f/u with Dr. [name] CTS -no muffled heart sounds today, sounds great  4. Essential hypertension, benign -bp was elevated this afternoon--suspect multifactorial--sob when walked in (had trouble getting her rollator out of car after she drove herself here--not recommended at this point), also is not on diuretic anymore so I restarted hctz for her b/c she also had increased edema (and has always been on hydralazine so not a side effect of that) - hydrochlorothiazide (HYDRODIURIL) 25 MG tablet; Take 1 tablet (25 mg total) by mouth daily.  Dispense: 90 tablet; Refill: 3 - hydrALAZINE (APRESOLINE) 25 MG tablet; Take 1 tablet (25 mg total) by mouth every 8 (eight) hours.  Dispense: 270 tablet; Refill: 3 - diltiazem (CARDIZEM CD) 360 MG 24 hr capsule; Take 1 capsule (360 mg total) by mouth daily.  Dispense: 90 capsule; Refill: 3  5. Mixed hyperlipidemia -cont lovastatin -Rx was sent to [facility]  6. Anxiety state -advised to fill hard script provided at time of hospital discharge (30 pills of xanax)--she does not want to continue this longstanding  7. COPD, frequent exacerbations -sample spiriva and brovana were also provided today and Rxs sent for all of the following to [facility]: -  tiotropium (SPIRIVA) 18 MCG inhalation capsule; Place 1 capsule (18 mcg total) into inhaler  and inhale daily.  Dispense: 90 capsule; Refill: 3 - budesonide (PULMICORT) 0.25 MG/2ML nebulizer solution; Take 2 mLs (0.25 mg total) by nebulization 2 (two) times daily.  Dispense: 120 mL; Refill: 12 - arformoterol (BROVANA) 15 MCG/2ML NEBU; Take 2 mLs (15 mcg total) by nebulization 2 (two) times daily.  Dispense: 120 mL; Refill: 3 - albuterol (PROVENTIL) (2.5 MG/3ML) 0.083% nebulizer solution; Take 3 mLs (2.5 mg total) by nebulization every 3 (three) hours as needed for wheezing or shortness of breath.  Dispense: 75 mL; Refill: 12  8. Gastroesophageal reflux disease with esophagitis - Rx sent to pharmacy to cont protonix - pantoprazole (PROTONIX) 40 MG tablet; Take 1 tablet (40 mg total) by mouth daily.  Dispense: 90 tablet; Refill: 3  Weakness improving, given a few glucerna shakes to help with nutrition, she is eating, but appetite is not completely back to baseline.  Labs/tests ordered:  Will need f/u cbc, bmp next visit--lab was closed by time we finished reconciling her medications---this visit took approximately 90 minutes to complete  Next appt:  6 wks with me  Sharman Garrott L. Yamir Carignan, D.O. [facility] [address], [place] [address] Cell Phone (Mon-Fri 8am-5pm):  [phone] On Call:  [phone] & follow prompts after 5pm & weekends Office Phone:  [phone] Office Fax:  [phone]

## 2014-07-08 ENCOUNTER — Encounter: Payer: Self-pay | Admitting: Internal Medicine

## 2014-07-08 ENCOUNTER — Ambulatory Visit: Payer: Medicare Other | Admitting: Internal Medicine

## 2014-07-11 ENCOUNTER — Telehealth: Payer: Self-pay

## 2014-07-11 MED ORDER — FLUVASTATIN SODIUM ER 80 MG PO TB24: 80.0000 mg | ORAL_TABLET | Freq: Every day | ORAL | Status: AC

## 2014-07-11 MED ORDER — AMBULATORY NON FORMULARY MEDICATION: Status: AC

## 2014-07-11 NOTE — Telephone Encounter (Signed)
Per Dr.Reed, Call [facility] to get a status update on referral and to inform [place] to make sure patient received medications filled at [facility] on 07/07/14.  Patient needs to check blood sugar daily before breakfast and write it down, if greater than 200 patient will need to begin insulin. Patient needs education on medications  I called [facility] and spoke with Judeen Hammans, I was told to fax orders to [phone], patient was seen on 07/07/2014.   I called patient, patient states she only picked-up HCTZ and Hydralazine. Patient will pick-up there others when the weather is better. Pateint states one medication cost her 600 dollars and she will not get it at all (not sure of name). I will follow-up with pharmacy

## 2014-07-13 ENCOUNTER — Telehealth: Payer: Self-pay | Admitting: *Deleted

## 2014-07-13 NOTE — Telephone Encounter (Signed)
[employer] Prior Authorization Form # [id number] Fax # [phone] fill out for [facility] due to COPD with frequent exacerbations. Filled out and given to Dr. Mariea Clonts for review and signature.

## 2014-07-14 ENCOUNTER — Telehealth: Payer: Self-pay

## 2014-07-14 ENCOUNTER — Inpatient Hospital Stay: Payer: Medicare Other | Admitting: Internal Medicine

## 2014-07-14 NOTE — Telephone Encounter (Signed)
Prior authorization request form from [facility] for Fluvatatin given to Dr Mariea Clonts to sign.

## 2014-07-18 ENCOUNTER — Telehealth: Payer: Self-pay

## 2014-07-18 ENCOUNTER — Telehealth: Payer: Self-pay | Admitting: Internal Medicine

## 2014-07-18 NOTE — Telephone Encounter (Signed)
Called [facility] about getting medication Brovana for patient. They said to fax order to [phone] with insurance information (patient has to have Medicare) and demographics. Called patient to let her know that we called [employer], they may call her. She appreciated that.

## 2014-07-18 NOTE — Telephone Encounter (Signed)
Received [facility] #: [phone] fax stating in response to recent request for authorization for prescription coverage for the member referenced, we are informing that the member's plan does not provide prescription benefits.  Given to Dr. Mariea Clonts to review.

## 2014-07-18 NOTE — Telephone Encounter (Signed)
Received fax from [facility] [facility] # [phone] they have approved Fluvastatin until 07/15/2016.

## 2014-07-18 NOTE — Telephone Encounter (Signed)
Spoke with pt. States that she began having pain in her L side after doing exercises with PT. Reports a sharp pain in L side where her surgery was done on her heart. Advised her that she would need to talk with the doctor who did her surgery. She was under the impression that that is who's office she called. Pt will call Dr. Leonarda Salon office to report this information to them as well.

## 2014-07-21 ENCOUNTER — Inpatient Hospital Stay: Payer: Medicare Other | Admitting: Adult Health

## 2014-07-21 NOTE — Telephone Encounter (Signed)
Pharmacy indicated patient does not have good insurance coverage on medications, most her her medications is very expensive.  I followed-up with patient today to see if she was able to get any other medications. Patient states she currently has HCTZ, Hydralazine, Albuterol, and Trajenta. Patient is planning on calling the pharmacy to get cost and will pick-up whichever medications she can afford.   Dr.Reed was verbally informed

## 2014-07-22 ENCOUNTER — Telehealth: Payer: Self-pay

## 2014-07-22 NOTE — Telephone Encounter (Signed)
[employer] called need copy of office note faxed to [phone] before they can get the neub medication for patient. Done

## 2014-08-11 ENCOUNTER — Ambulatory Visit: Payer: Medicare Other | Admitting: Cardiovascular Disease

## 2014-08-16 DEATH — deceased

## 2014-08-29 ENCOUNTER — Ambulatory Visit: Payer: Self-pay | Admitting: Internal Medicine

## 2016-07-19 IMAGING — CR DG CHEST 1V PORT
1 series · 1 of 1 positions shown · non-contrast
Comparison: Chest x-ray 02/03/2014

CLINICAL DATA: Severe shortness of breath today.

EXAM:
PORTABLE CHEST - 1 VIEW

[AP]
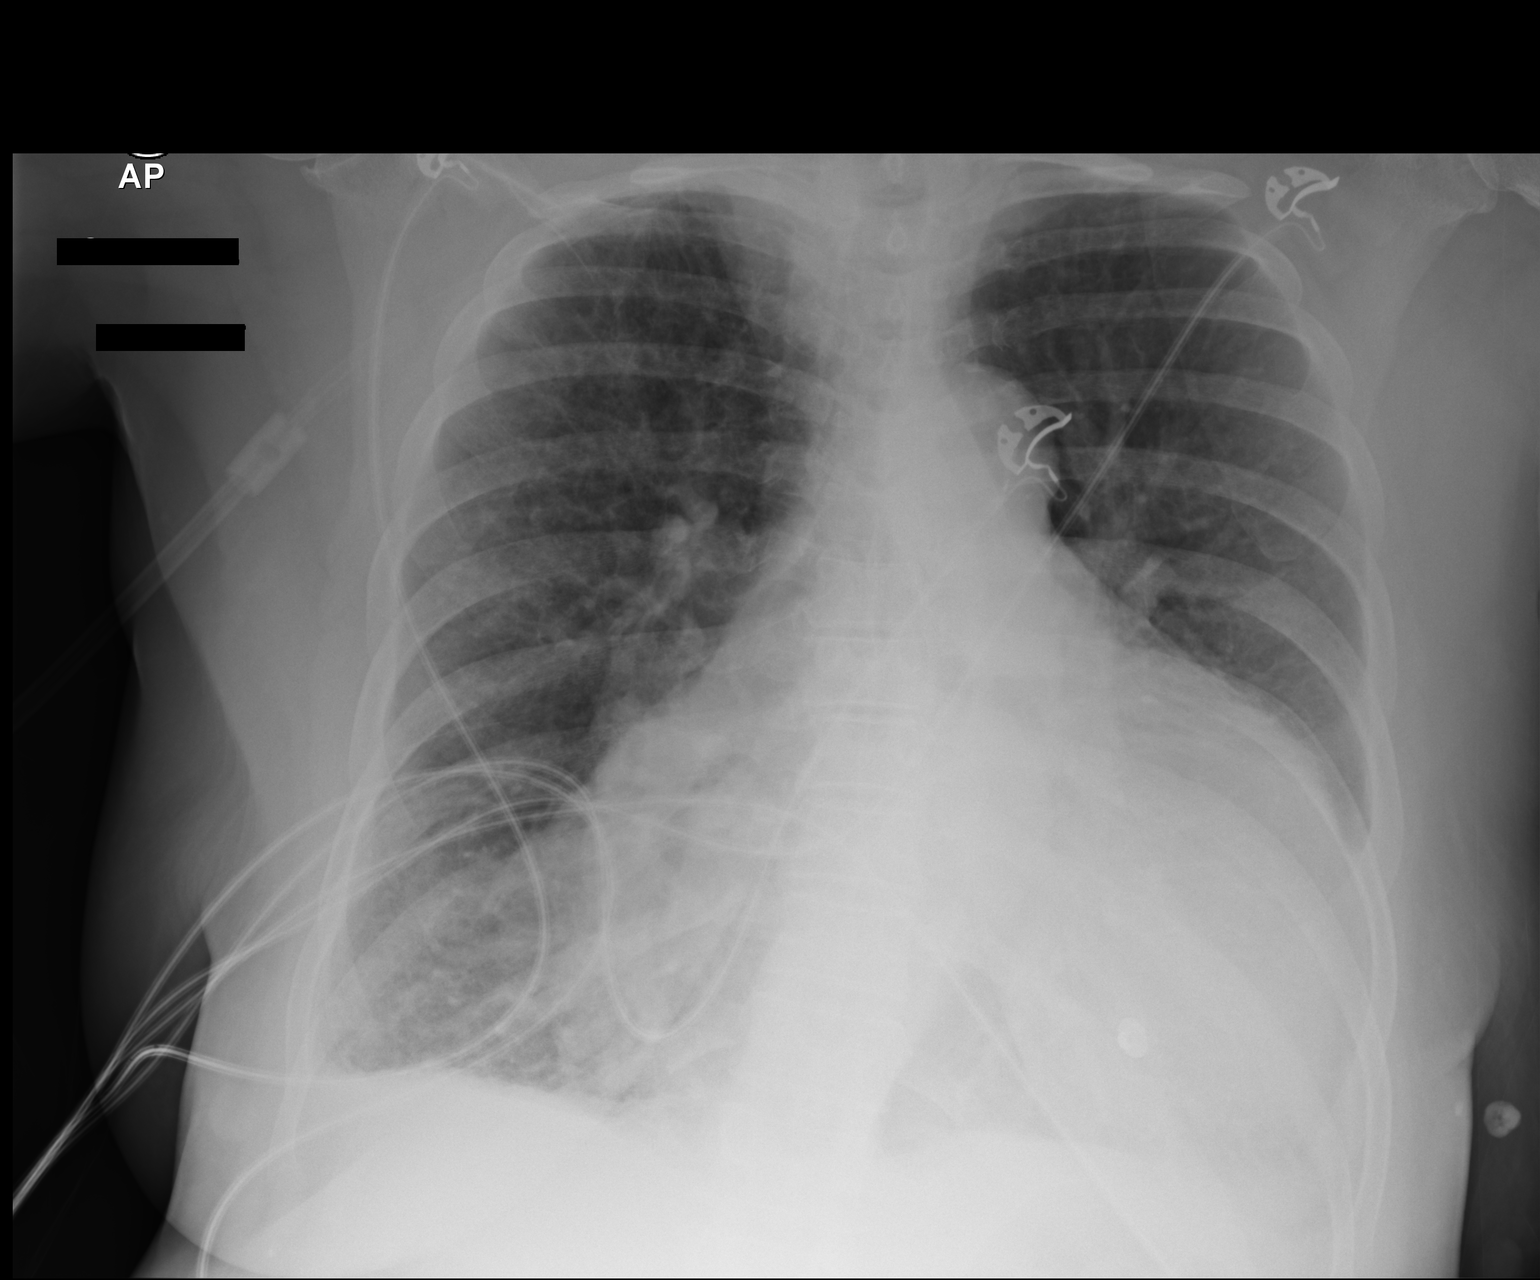

[1 of 1 positions shown; findings below may reference images not displayed]

FINDINGS: The heart is enlarged but stable. There is tortuosity and ectasia of
the thoracic aorta. There is vascular congestion and interstitial
edema along with small pleural effusions consistent with CHF. No
focal infiltrates. The bony thorax is intact.
IMPRESSION: CHF.

## 2016-07-20 IMAGING — CR DG CHEST 1V PORT
1 series · 1 of 1 positions shown · non-contrast
Comparison: 04/18/2014 and prior radiographs

CLINICAL DATA: 68-year-old female with shortness of breath.

EXAM:
PORTABLE CHEST - 1 VIEW

[AP]
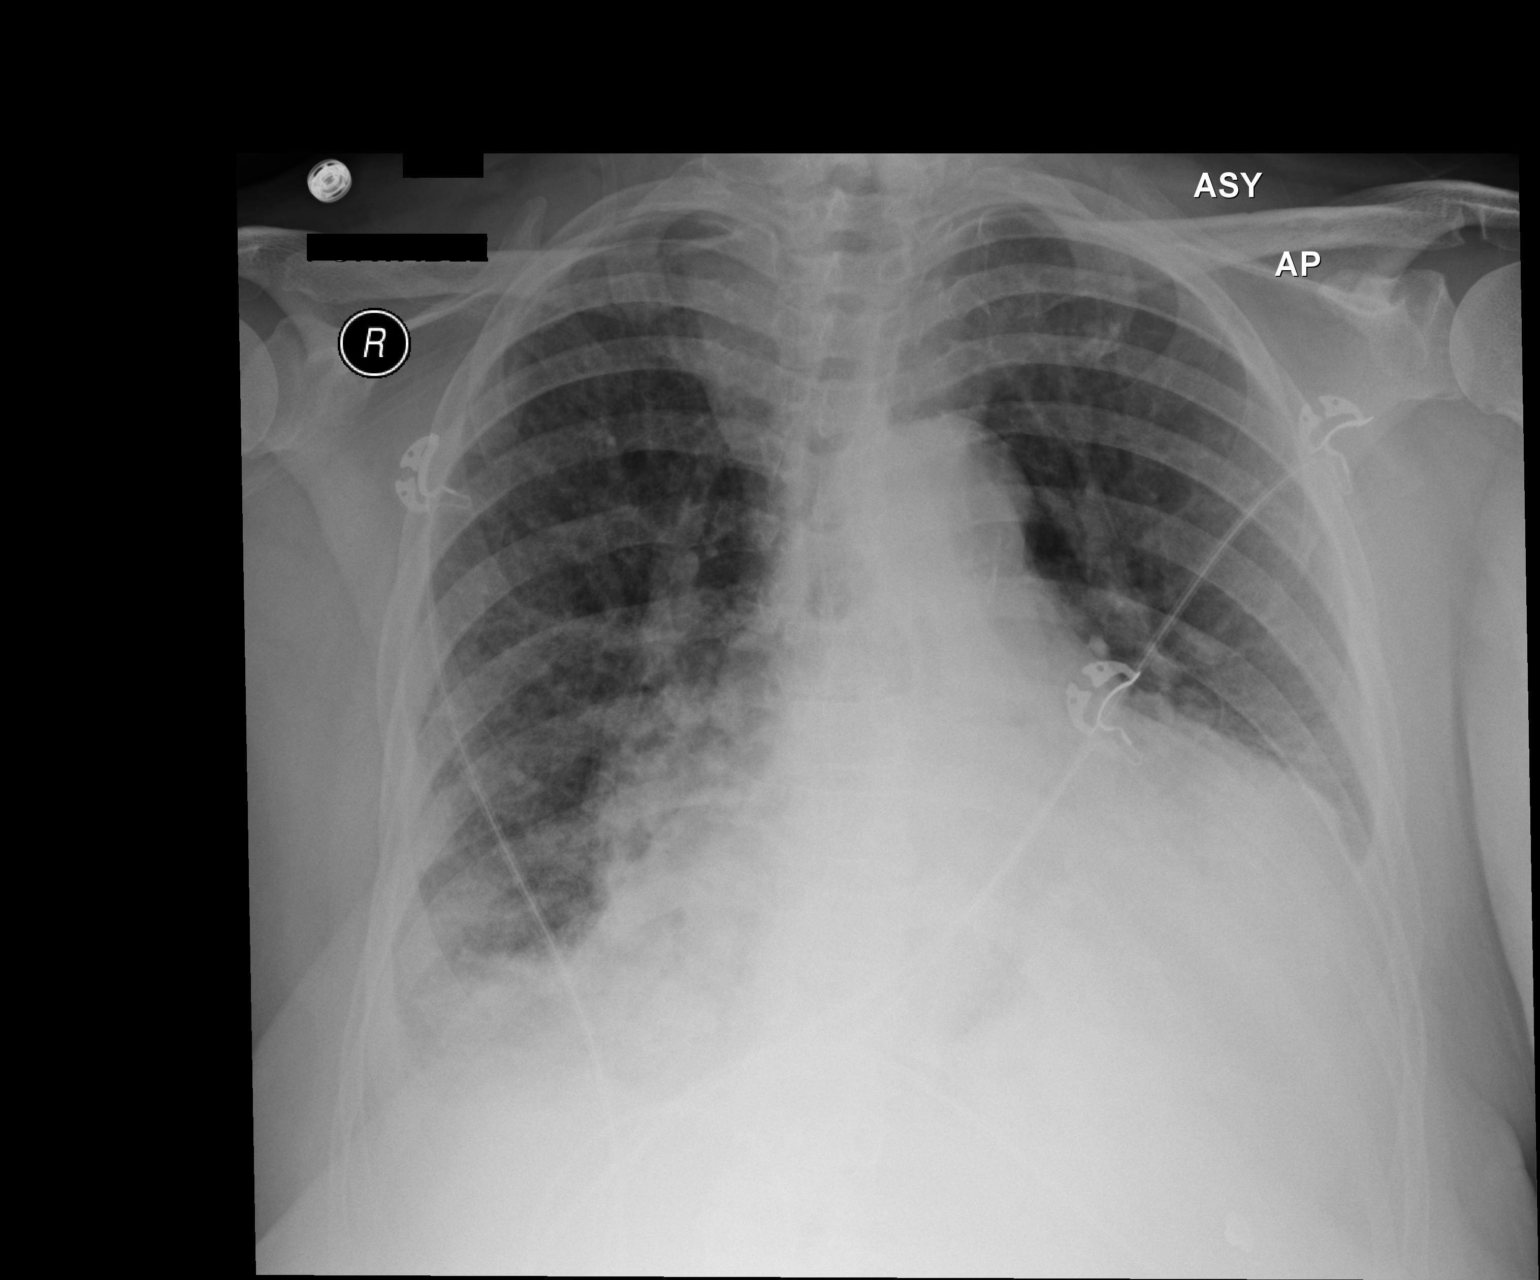

[1 of 1 positions shown; findings below may reference images not displayed]

FINDINGS: Cardiomegaly, pulmonary vascular congestion and pulmonary edema
again noted.

Increasing bilateral lower lung opacities are noted compatible with
airspace disease or atelectasis.

There is no evidence of pneumothorax.

No other significant change is noted.
IMPRESSION: Pulmonary edema again noted with increasing bibasilar opacities
which may represent airspace disease/edema or atelectasis.

## 2016-08-04 IMAGING — DX DG CHEST 1V PORT
1 series · 1 of 1 positions shown · non-contrast
Comparison: 05/04/2014

CLINICAL DATA: Central line placement.

EXAM:
PORTABLE CHEST - 1 VIEW

[chest ap]
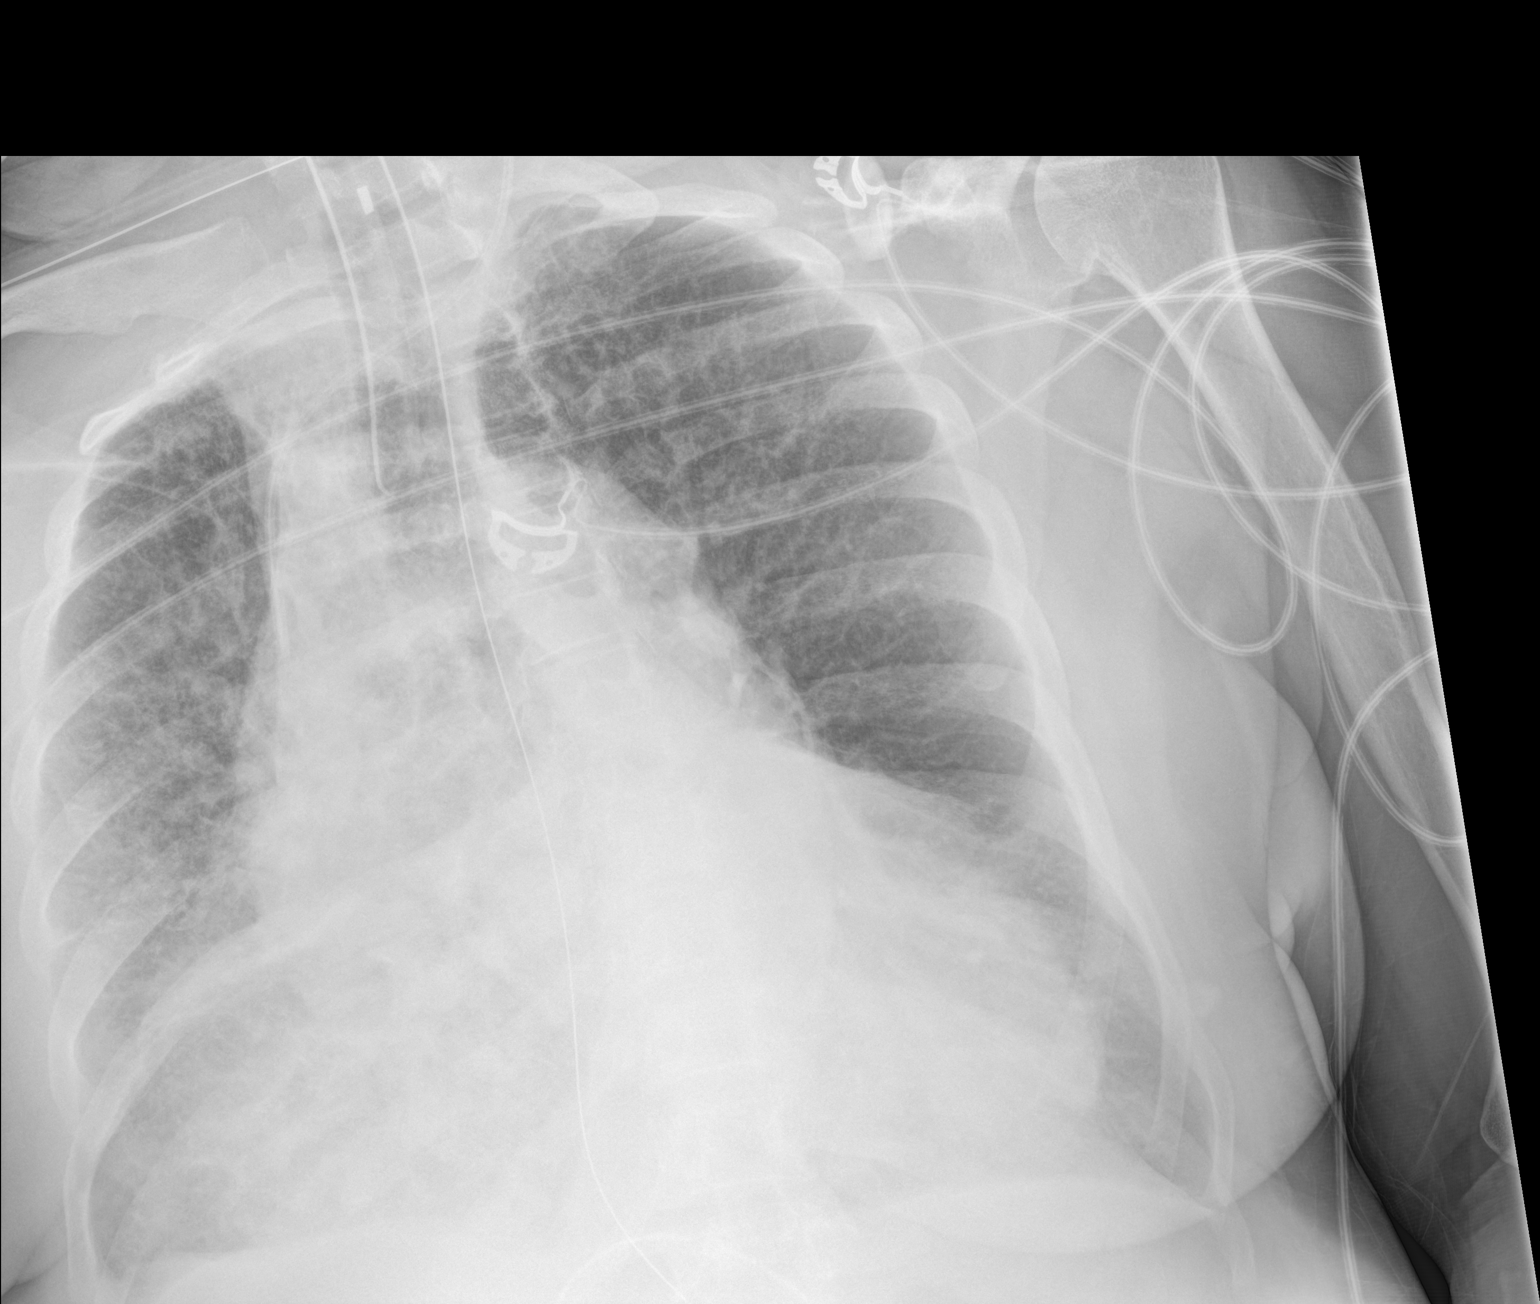

[1 of 1 positions shown; findings below may reference images not displayed]

FINDINGS: Examination is technically limited due to patient rotation.
Endotracheal tube and enteric tube appear unchanged in position. New
placement of left central venous catheter with tip overlying the mid
SVC region. No pneumothorax. Persistent cardiac enlargement with
pulmonary vascular congestion and perihilar edema.
IMPRESSION: Appliances appear in satisfactory location. Cardiac enlargement with
pulmonary vascular congestion and perihilar edema without
significant change.

## 2016-08-04 IMAGING — DX DG CHEST 1V PORT
1 series · 1 of 1 positions shown · non-contrast
Comparison: 04/19/2014

CLINICAL DATA: Worsening shortness of breath.

EXAM:
PORTABLE CHEST - 1 VIEW

[chest ap]
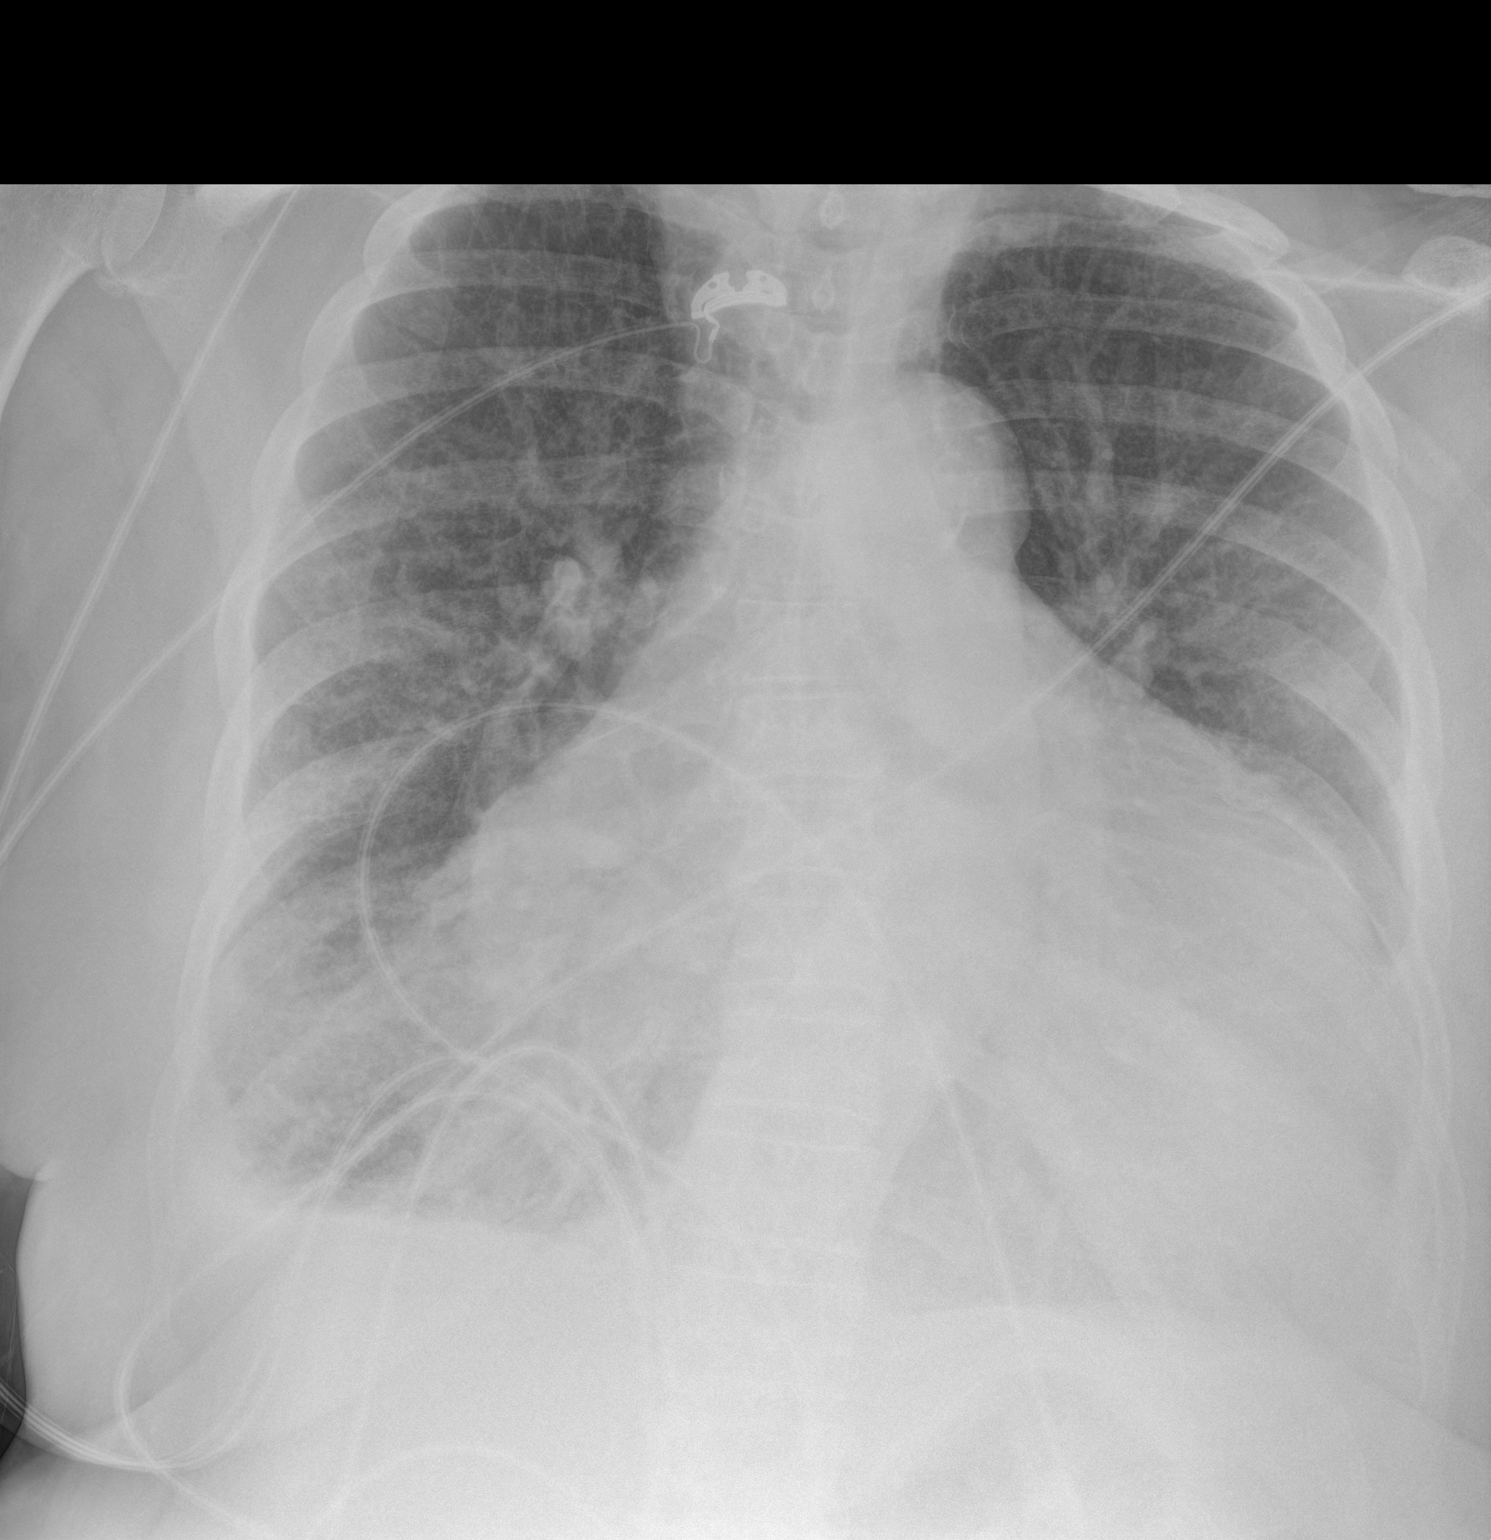

[1 of 1 positions shown; findings below may reference images not displayed]

FINDINGS: Marked cardiac enlargement with mild-to-moderate pulmonary vascular
congestion. Bilateral basilar opacities again demonstrated
consistent with edema or atelectasis. Small bilateral pleural
effusions. Similar appearance to previous study. No pneumothorax.
Calcified and tortuous aorta.
IMPRESSION: Cardiac enlargement with pulmonary vascular congestion, small
bilateral pleural effusions, and basilar infiltrates similar to
previous study.

## 2016-08-05 IMAGING — CR DG CHEST 1V PORT
1 series · 1 of 1 positions shown · non-contrast
Comparison: May 04, 2014.

CLINICAL DATA: Acute respiratory failure.

EXAM:
PORTABLE CHEST - 1 VIEW

[AP]
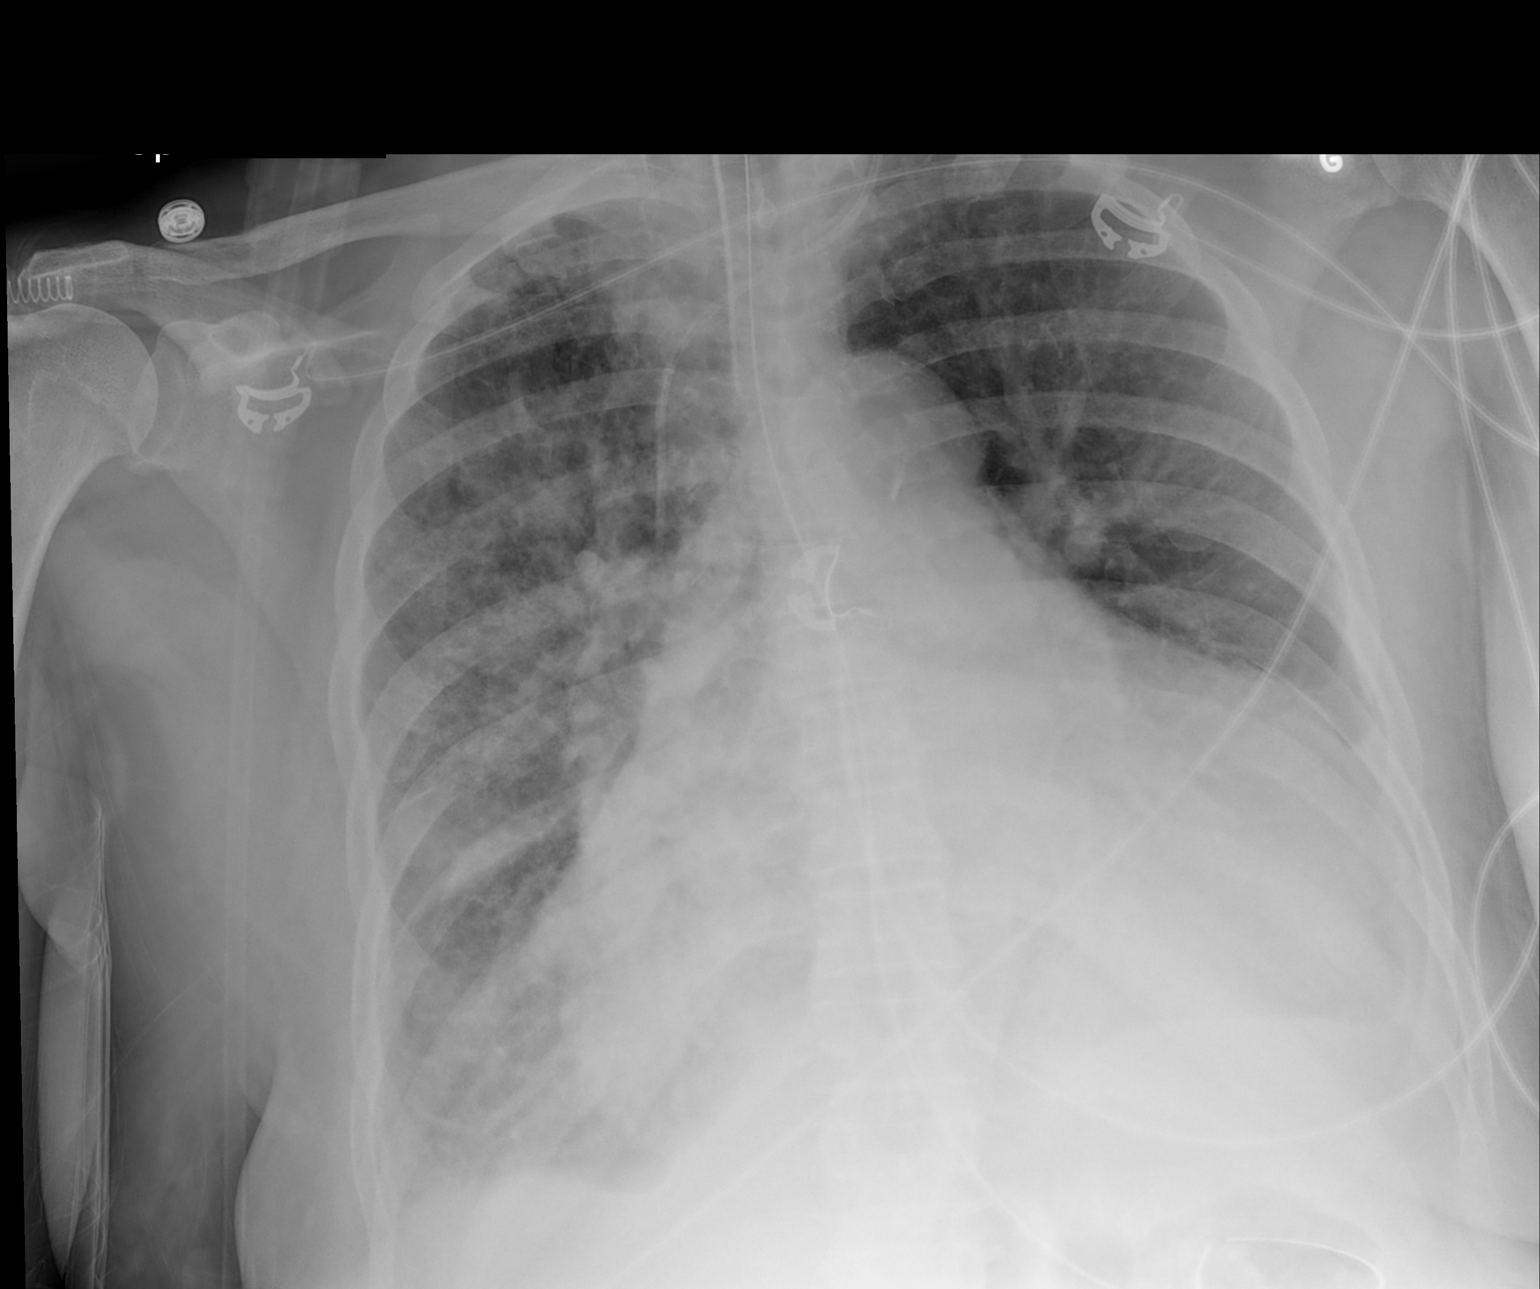

[1 of 1 positions shown; findings below may reference images not displayed]

FINDINGS: Stable cardiomegaly. Endotracheal tube is in grossly good position
with distal tip 5.5 cm above the carina. Nasogastric tube is seen
entering the stomach. Left internal jugular catheter is unchanged in
position with distal tip in the expected position the SVC. Stable
central pulmonary vascular congestion is noted. No pneumothorax is
noted. Minimal bilateral pleural effusions are noted. Stable
bilateral perihilar edema is noted which is more prominent on the
right.
IMPRESSION: Stable central pulmonary vascular congestion and asymmetric
pulmonary edema is noted. Minimal bilateral pleural effusions are
noted. Stable support apparatus.
